# Patient Record
Sex: Female | Born: 1945 | Race: White | Hispanic: No | Smoking: Current some day smoker
Health system: Southern US, Community
[De-identification: ages and names within clinical notes are randomized; demographics above are authoritative.]

## PROBLEM LIST (undated history)

## (undated) DIAGNOSIS — T8859XA Other complications of anesthesia, initial encounter: Secondary | ICD-10-CM

## (undated) DIAGNOSIS — I2510 Atherosclerotic heart disease of native coronary artery without angina pectoris: Secondary | ICD-10-CM

## (undated) DIAGNOSIS — I509 Heart failure, unspecified: Secondary | ICD-10-CM

## (undated) DIAGNOSIS — H269 Unspecified cataract: Secondary | ICD-10-CM

## (undated) DIAGNOSIS — C801 Malignant (primary) neoplasm, unspecified: Secondary | ICD-10-CM

## (undated) DIAGNOSIS — D649 Anemia, unspecified: Secondary | ICD-10-CM

## (undated) DIAGNOSIS — E785 Hyperlipidemia, unspecified: Secondary | ICD-10-CM

## (undated) DIAGNOSIS — I255 Ischemic cardiomyopathy: Secondary | ICD-10-CM

## (undated) DIAGNOSIS — K219 Gastro-esophageal reflux disease without esophagitis: Secondary | ICD-10-CM

## (undated) DIAGNOSIS — T4145XA Adverse effect of unspecified anesthetic, initial encounter: Secondary | ICD-10-CM

## (undated) DIAGNOSIS — R0602 Shortness of breath: Secondary | ICD-10-CM

## (undated) DIAGNOSIS — M1990 Unspecified osteoarthritis, unspecified site: Secondary | ICD-10-CM

## (undated) HISTORY — DX: Anemia, unspecified: D64.9

## (undated) HISTORY — PX: APPENDECTOMY: SHX54

## (undated) HISTORY — DX: Ischemic cardiomyopathy: I25.5

## (undated) HISTORY — DX: Unspecified cataract: H26.9

## (undated) HISTORY — DX: Unspecified osteoarthritis, unspecified site: M19.90

## (undated) HISTORY — PX: BACK SURGERY: SHX140

## (undated) HISTORY — PX: TUBAL LIGATION: SHX77

## (undated) HISTORY — DX: Gastro-esophageal reflux disease without esophagitis: K21.9

## (undated) HISTORY — DX: Heart failure, unspecified: I50.9

## (undated) HISTORY — PX: JOINT REPLACEMENT: SHX530

## (undated) HISTORY — PX: BRAIN SURGERY: SHX531

## (undated) HISTORY — DX: Hyperlipidemia, unspecified: E78.5

## (undated) HISTORY — PX: BI-VENTRICULAR IMPLANTABLE CARDIOVERTER DEFIBRILLATOR  (CRT-D): SHX5747

## (undated) HISTORY — DX: Atherosclerotic heart disease of native coronary artery without angina pectoris: I25.10

## (undated) HISTORY — DX: Shortness of breath: R06.02

---

## 1998-04-03 ENCOUNTER — Ambulatory Visit (HOSPITAL_BASED_OUTPATIENT_CLINIC_OR_DEPARTMENT_OTHER): Admission: RE | Admit: 1998-04-03 | Discharge: 1998-04-03 | Payer: Self-pay | Admitting: Orthopedic Surgery

## 2003-05-11 ENCOUNTER — Encounter: Admission: RE | Admit: 2003-05-11 | Discharge: 2003-05-11 | Payer: Self-pay | Admitting: Urology

## 2003-05-15 ENCOUNTER — Encounter (INDEPENDENT_AMBULATORY_CARE_PROVIDER_SITE_OTHER): Payer: Self-pay | Admitting: Specialist

## 2003-05-15 ENCOUNTER — Ambulatory Visit (HOSPITAL_BASED_OUTPATIENT_CLINIC_OR_DEPARTMENT_OTHER): Admission: RE | Admit: 2003-05-15 | Discharge: 2003-05-15 | Payer: Self-pay | Admitting: Urology

## 2003-05-15 ENCOUNTER — Ambulatory Visit (HOSPITAL_COMMUNITY): Admission: RE | Admit: 2003-05-15 | Discharge: 2003-05-15 | Payer: Self-pay | Admitting: Urology

## 2003-08-01 ENCOUNTER — Inpatient Hospital Stay (HOSPITAL_COMMUNITY): Admission: RE | Admit: 2003-08-01 | Discharge: 2003-08-06 | Payer: Self-pay | Admitting: Orthopaedic Surgery

## 2004-05-22 ENCOUNTER — Encounter: Admission: RE | Admit: 2004-05-22 | Discharge: 2004-05-22 | Payer: Self-pay | Admitting: Orthopedic Surgery

## 2004-06-24 ENCOUNTER — Other Ambulatory Visit: Admission: RE | Admit: 2004-06-24 | Discharge: 2004-06-24 | Payer: Self-pay | Admitting: Family Medicine

## 2004-07-05 ENCOUNTER — Encounter: Admission: RE | Admit: 2004-07-05 | Discharge: 2004-07-05 | Payer: Self-pay | Admitting: Neurosurgery

## 2004-07-24 ENCOUNTER — Inpatient Hospital Stay (HOSPITAL_COMMUNITY): Admission: RE | Admit: 2004-07-24 | Discharge: 2004-07-27 | Payer: Self-pay | Admitting: Neurosurgery

## 2004-10-10 ENCOUNTER — Encounter: Admission: RE | Admit: 2004-10-10 | Discharge: 2004-10-10 | Payer: Self-pay | Admitting: Neurosurgery

## 2004-11-28 ENCOUNTER — Inpatient Hospital Stay (HOSPITAL_COMMUNITY): Admission: RE | Admit: 2004-11-28 | Discharge: 2004-12-03 | Payer: Self-pay | Admitting: Cardiology

## 2005-01-09 ENCOUNTER — Encounter: Admission: RE | Admit: 2005-01-09 | Discharge: 2005-01-09 | Payer: Self-pay | Admitting: Neurosurgery

## 2005-01-28 ENCOUNTER — Ambulatory Visit: Payer: Self-pay | Admitting: Hematology and Oncology

## 2005-01-30 ENCOUNTER — Encounter: Admission: RE | Admit: 2005-01-30 | Discharge: 2005-01-30 | Payer: Self-pay | Admitting: *Deleted

## 2005-02-11 ENCOUNTER — Encounter (INDEPENDENT_AMBULATORY_CARE_PROVIDER_SITE_OTHER): Payer: Self-pay | Admitting: *Deleted

## 2005-02-11 ENCOUNTER — Other Ambulatory Visit: Admission: RE | Admit: 2005-02-11 | Discharge: 2005-02-11 | Payer: Self-pay | Admitting: Hematology and Oncology

## 2005-02-14 ENCOUNTER — Observation Stay (HOSPITAL_COMMUNITY): Admission: EM | Admit: 2005-02-14 | Discharge: 2005-02-15 | Payer: Self-pay | Admitting: Emergency Medicine

## 2005-02-18 ENCOUNTER — Encounter: Admission: RE | Admit: 2005-02-18 | Discharge: 2005-02-18 | Payer: Self-pay | Admitting: Neurosurgery

## 2005-03-21 ENCOUNTER — Ambulatory Visit: Payer: Self-pay | Admitting: Hematology and Oncology

## 2005-03-26 ENCOUNTER — Ambulatory Visit (HOSPITAL_COMMUNITY): Admission: RE | Admit: 2005-03-26 | Discharge: 2005-03-26 | Payer: Self-pay | Admitting: Gastroenterology

## 2005-06-05 ENCOUNTER — Ambulatory Visit (HOSPITAL_COMMUNITY): Admission: RE | Admit: 2005-06-05 | Discharge: 2005-06-05 | Payer: Self-pay | Admitting: Cardiology

## 2005-06-19 ENCOUNTER — Encounter: Admission: RE | Admit: 2005-06-19 | Discharge: 2005-06-19 | Payer: Self-pay | Admitting: Cardiology

## 2005-09-16 ENCOUNTER — Other Ambulatory Visit: Admission: RE | Admit: 2005-09-16 | Discharge: 2005-09-16 | Payer: Self-pay | Admitting: Obstetrics and Gynecology

## 2005-09-17 ENCOUNTER — Ambulatory Visit: Admission: RE | Admit: 2005-09-17 | Discharge: 2005-09-17 | Payer: Self-pay | Admitting: Orthopaedic Surgery

## 2005-09-18 ENCOUNTER — Encounter (HOSPITAL_COMMUNITY): Admission: RE | Admit: 2005-09-18 | Discharge: 2005-12-12 | Payer: Self-pay | Admitting: *Deleted

## 2005-10-24 ENCOUNTER — Ambulatory Visit (HOSPITAL_COMMUNITY): Admission: RE | Admit: 2005-10-24 | Discharge: 2005-10-24 | Payer: Self-pay | Admitting: Orthopaedic Surgery

## 2005-11-13 ENCOUNTER — Observation Stay (HOSPITAL_COMMUNITY): Admission: AD | Admit: 2005-11-13 | Discharge: 2005-11-14 | Payer: Self-pay | Admitting: *Deleted

## 2005-11-18 ENCOUNTER — Inpatient Hospital Stay (HOSPITAL_COMMUNITY): Admission: RE | Admit: 2005-11-18 | Discharge: 2005-11-23 | Payer: Self-pay | Admitting: Orthopaedic Surgery

## 2005-12-05 ENCOUNTER — Emergency Department (HOSPITAL_COMMUNITY): Admission: EM | Admit: 2005-12-05 | Discharge: 2005-12-05 | Payer: Self-pay | Admitting: Emergency Medicine

## 2005-12-22 ENCOUNTER — Encounter
Admission: RE | Admit: 2005-12-22 | Discharge: 2006-03-22 | Payer: Self-pay | Admitting: Physical Medicine & Rehabilitation

## 2005-12-22 ENCOUNTER — Ambulatory Visit: Payer: Self-pay | Admitting: Physical Medicine & Rehabilitation

## 2006-01-07 ENCOUNTER — Ambulatory Visit (HOSPITAL_COMMUNITY): Admission: RE | Admit: 2006-01-07 | Discharge: 2006-01-07 | Payer: Self-pay | Admitting: *Deleted

## 2006-01-13 ENCOUNTER — Inpatient Hospital Stay (HOSPITAL_COMMUNITY): Admission: AD | Admit: 2006-01-13 | Discharge: 2006-01-16 | Payer: Self-pay | Admitting: Internal Medicine

## 2006-01-15 ENCOUNTER — Encounter (INDEPENDENT_AMBULATORY_CARE_PROVIDER_SITE_OTHER): Payer: Self-pay | Admitting: Specialist

## 2006-03-17 ENCOUNTER — Ambulatory Visit: Payer: Self-pay | Admitting: Physical Medicine & Rehabilitation

## 2006-04-01 ENCOUNTER — Encounter: Admission: RE | Admit: 2006-04-01 | Discharge: 2006-04-01 | Payer: Self-pay | Admitting: *Deleted

## 2006-04-02 ENCOUNTER — Encounter
Admission: RE | Admit: 2006-04-02 | Discharge: 2006-07-01 | Payer: Self-pay | Admitting: Physical Medicine & Rehabilitation

## 2006-05-07 ENCOUNTER — Ambulatory Visit: Payer: Self-pay | Admitting: Physical Medicine & Rehabilitation

## 2006-05-08 ENCOUNTER — Ambulatory Visit: Payer: Self-pay | Admitting: Licensed Clinical Social Worker

## 2006-05-29 ENCOUNTER — Ambulatory Visit: Payer: Self-pay | Admitting: Licensed Clinical Social Worker

## 2006-06-26 ENCOUNTER — Ambulatory Visit: Payer: Self-pay | Admitting: Licensed Clinical Social Worker

## 2006-07-24 ENCOUNTER — Ambulatory Visit: Payer: Self-pay | Admitting: Licensed Clinical Social Worker

## 2006-08-07 ENCOUNTER — Ambulatory Visit: Payer: Self-pay | Admitting: Licensed Clinical Social Worker

## 2006-08-28 ENCOUNTER — Ambulatory Visit: Payer: Self-pay | Admitting: Licensed Clinical Social Worker

## 2006-08-31 ENCOUNTER — Ambulatory Visit: Payer: Self-pay | Admitting: Physical Medicine & Rehabilitation

## 2006-08-31 ENCOUNTER — Encounter
Admission: RE | Admit: 2006-08-31 | Discharge: 2006-11-29 | Payer: Self-pay | Admitting: Physical Medicine & Rehabilitation

## 2006-09-11 ENCOUNTER — Ambulatory Visit: Payer: Self-pay | Admitting: Licensed Clinical Social Worker

## 2006-09-23 ENCOUNTER — Other Ambulatory Visit: Admission: RE | Admit: 2006-09-23 | Discharge: 2006-09-23 | Payer: Self-pay | Admitting: Obstetrics and Gynecology

## 2006-10-09 ENCOUNTER — Ambulatory Visit: Payer: Self-pay | Admitting: Licensed Clinical Social Worker

## 2006-10-26 ENCOUNTER — Ambulatory Visit: Payer: Self-pay | Admitting: Physical Medicine & Rehabilitation

## 2006-11-27 ENCOUNTER — Ambulatory Visit: Payer: Self-pay | Admitting: Licensed Clinical Social Worker

## 2007-02-04 ENCOUNTER — Encounter (HOSPITAL_COMMUNITY): Admission: RE | Admit: 2007-02-04 | Discharge: 2007-04-02 | Payer: Self-pay | Admitting: Internal Medicine

## 2007-02-09 ENCOUNTER — Inpatient Hospital Stay (HOSPITAL_COMMUNITY): Admission: EM | Admit: 2007-02-09 | Discharge: 2007-02-12 | Payer: Self-pay | Admitting: Emergency Medicine

## 2007-02-10 ENCOUNTER — Encounter (INDEPENDENT_AMBULATORY_CARE_PROVIDER_SITE_OTHER): Payer: Self-pay | Admitting: Internal Medicine

## 2007-03-10 ENCOUNTER — Ambulatory Visit (HOSPITAL_COMMUNITY): Admission: RE | Admit: 2007-03-10 | Discharge: 2007-03-10 | Payer: Self-pay | Admitting: Cardiology

## 2007-03-23 ENCOUNTER — Inpatient Hospital Stay (HOSPITAL_COMMUNITY): Admission: AD | Admit: 2007-03-23 | Discharge: 2007-03-31 | Payer: Self-pay | Admitting: Cardiology

## 2007-03-23 ENCOUNTER — Ambulatory Visit: Payer: Self-pay | Admitting: Hematology and Oncology

## 2007-04-18 ENCOUNTER — Inpatient Hospital Stay (HOSPITAL_COMMUNITY): Admission: EM | Admit: 2007-04-18 | Discharge: 2007-04-23 | Payer: Self-pay | Admitting: *Deleted

## 2007-04-19 ENCOUNTER — Encounter (INDEPENDENT_AMBULATORY_CARE_PROVIDER_SITE_OTHER): Payer: Self-pay | Admitting: Internal Medicine

## 2007-05-07 ENCOUNTER — Encounter (HOSPITAL_COMMUNITY): Admission: RE | Admit: 2007-05-07 | Discharge: 2007-08-05 | Payer: Self-pay | Admitting: Family Medicine

## 2007-07-09 ENCOUNTER — Ambulatory Visit: Payer: Self-pay | Admitting: Licensed Clinical Social Worker

## 2007-07-26 ENCOUNTER — Ambulatory Visit (HOSPITAL_COMMUNITY): Admission: RE | Admit: 2007-07-26 | Discharge: 2007-07-26 | Payer: Self-pay | Admitting: Unknown Physician Specialty

## 2007-07-28 ENCOUNTER — Observation Stay (HOSPITAL_COMMUNITY): Admission: AD | Admit: 2007-07-28 | Discharge: 2007-07-29 | Payer: Self-pay | Admitting: Cardiology

## 2007-09-14 ENCOUNTER — Encounter (HOSPITAL_COMMUNITY): Admission: RE | Admit: 2007-09-14 | Discharge: 2007-11-08 | Payer: Self-pay | Admitting: Unknown Physician Specialty

## 2007-10-28 ENCOUNTER — Inpatient Hospital Stay (HOSPITAL_COMMUNITY): Admission: AD | Admit: 2007-10-28 | Discharge: 2007-11-01 | Payer: Self-pay | Admitting: Internal Medicine

## 2008-03-14 ENCOUNTER — Encounter
Admission: RE | Admit: 2008-03-14 | Discharge: 2008-06-12 | Payer: Self-pay | Admitting: Physical Medicine & Rehabilitation

## 2008-03-15 ENCOUNTER — Ambulatory Visit: Payer: Self-pay | Admitting: Physical Medicine & Rehabilitation

## 2008-04-17 ENCOUNTER — Ambulatory Visit: Payer: Self-pay | Admitting: Physical Medicine & Rehabilitation

## 2008-06-26 ENCOUNTER — Encounter: Admission: RE | Admit: 2008-06-26 | Discharge: 2008-06-26 | Payer: Self-pay | Admitting: Family Medicine

## 2008-07-05 ENCOUNTER — Encounter
Admission: RE | Admit: 2008-07-05 | Discharge: 2008-08-11 | Payer: Self-pay | Admitting: Physical Medicine & Rehabilitation

## 2008-07-10 ENCOUNTER — Ambulatory Visit: Payer: Self-pay | Admitting: Physical Medicine & Rehabilitation

## 2008-08-11 ENCOUNTER — Ambulatory Visit: Payer: Self-pay | Admitting: Physical Medicine & Rehabilitation

## 2008-10-31 ENCOUNTER — Encounter
Admission: RE | Admit: 2008-10-31 | Discharge: 2009-01-29 | Payer: Self-pay | Admitting: Physical Medicine & Rehabilitation

## 2008-11-07 ENCOUNTER — Ambulatory Visit: Payer: Self-pay | Admitting: Physical Medicine & Rehabilitation

## 2008-11-27 ENCOUNTER — Encounter
Admission: RE | Admit: 2008-11-27 | Discharge: 2008-12-28 | Payer: Self-pay | Admitting: Physical Medicine & Rehabilitation

## 2009-01-03 IMAGING — CR DG CHEST 2V
2 series · 2 of 2 positions shown · non-contrast
Comparison: 04/22/07.

CLINICAL DATA: Follow-up edema. 
 CHEST - 2 VIEW:

[w chest pa]
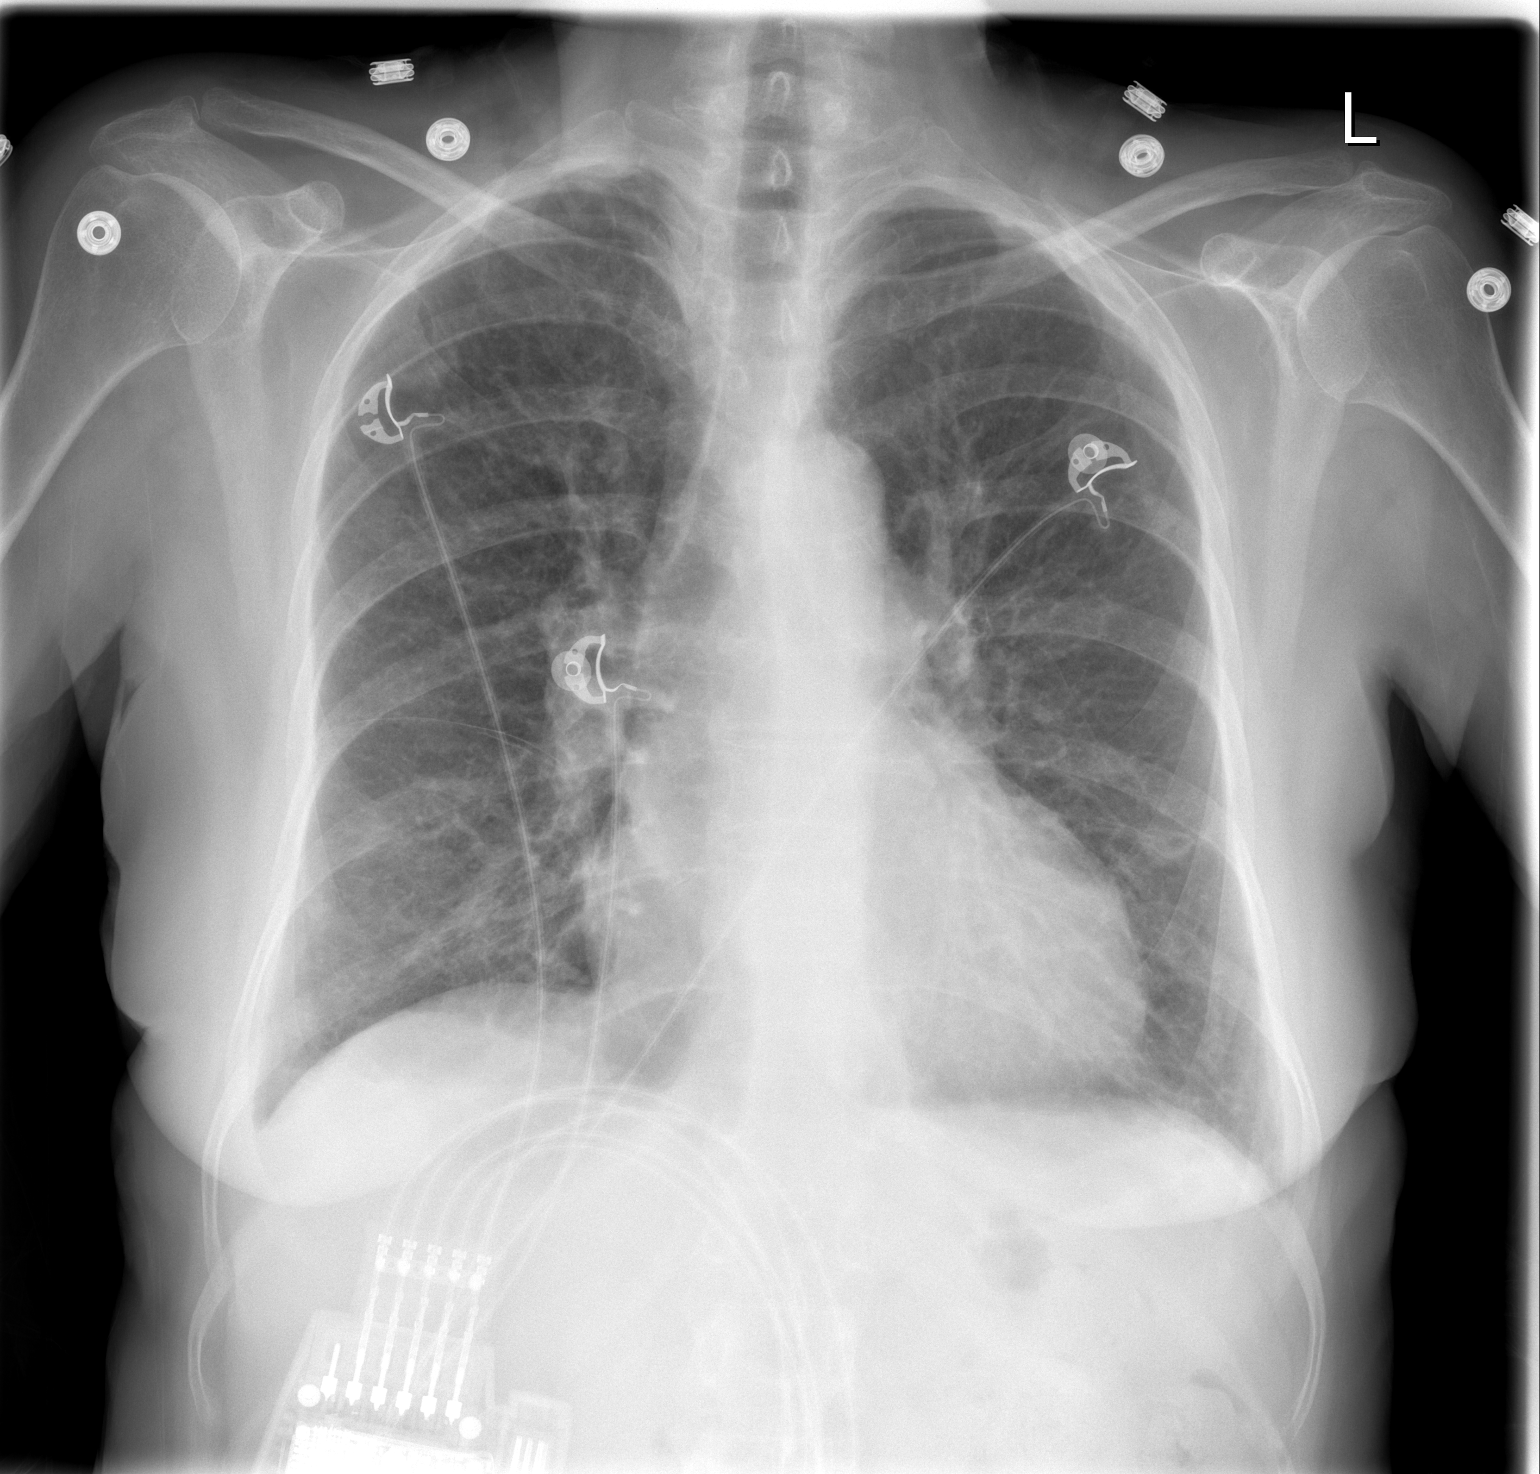

[w chest lat]
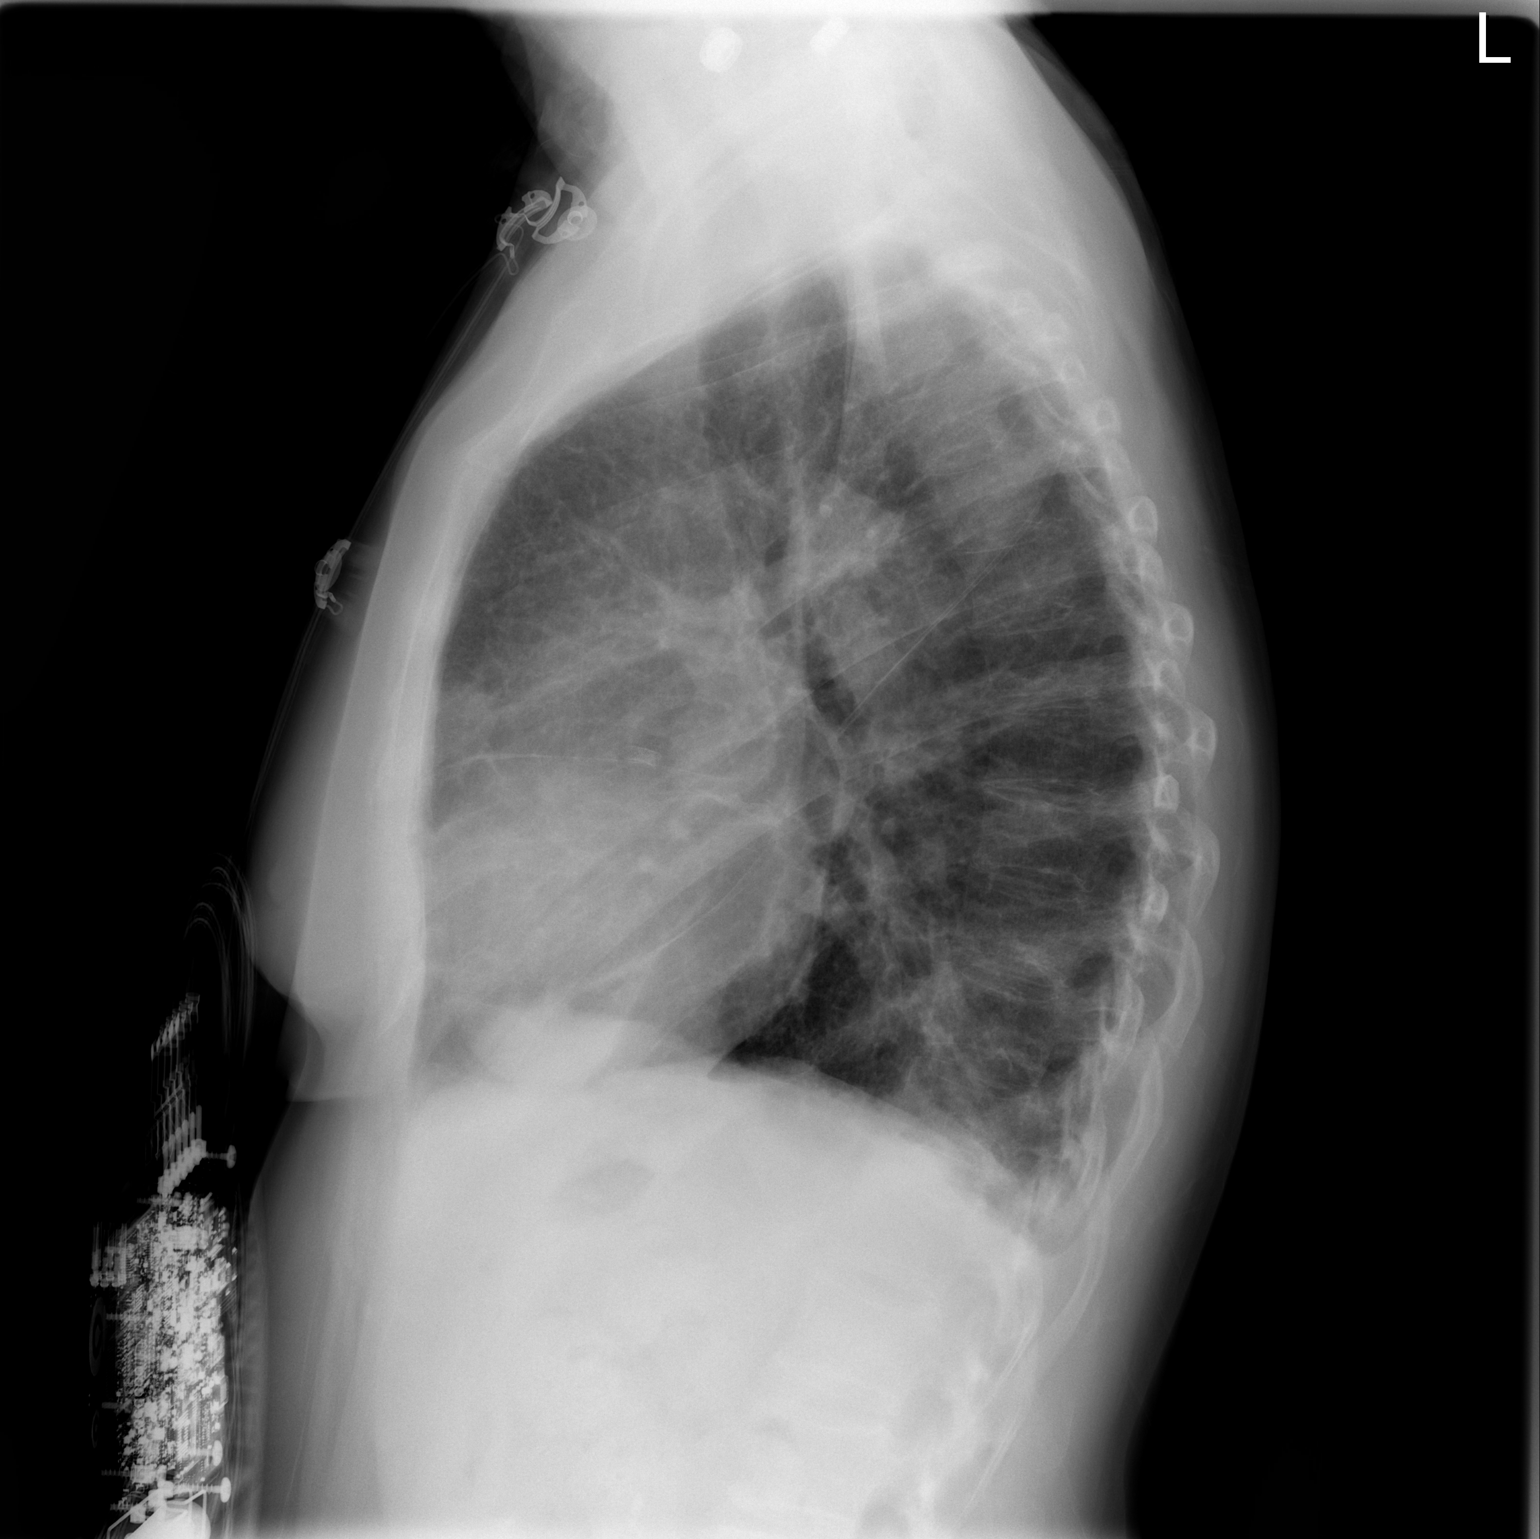

[2 of 2 positions shown; findings below may reference images not displayed]

FINDINGS: Continued improvement in pulmonary edema which is nearly completely resolved.  There remain small bilateral effusions.  There is some underlying COPD and scarring.  Coronary stent is noted in the region of the LAD.
IMPRESSION: Improving congestive heart failure and edema.  There are small bilateral effusions.

## 2009-01-22 ENCOUNTER — Ambulatory Visit: Payer: Self-pay | Admitting: Physical Medicine & Rehabilitation

## 2009-04-12 ENCOUNTER — Encounter
Admission: RE | Admit: 2009-04-12 | Discharge: 2009-07-11 | Payer: Self-pay | Admitting: Physical Medicine & Rehabilitation

## 2009-04-16 ENCOUNTER — Ambulatory Visit: Payer: Self-pay | Admitting: Physical Medicine & Rehabilitation

## 2009-05-03 ENCOUNTER — Ambulatory Visit: Payer: Self-pay | Admitting: Physical Medicine & Rehabilitation

## 2009-05-03 ENCOUNTER — Encounter
Admission: RE | Admit: 2009-05-03 | Discharge: 2009-07-09 | Payer: Self-pay | Admitting: Physical Medicine & Rehabilitation

## 2009-06-01 ENCOUNTER — Ambulatory Visit: Payer: Self-pay | Admitting: Physical Medicine & Rehabilitation

## 2009-06-19 ENCOUNTER — Encounter
Admission: RE | Admit: 2009-06-19 | Discharge: 2009-06-19 | Payer: Self-pay | Admitting: Physical Medicine & Rehabilitation

## 2009-06-22 ENCOUNTER — Ambulatory Visit: Payer: Self-pay | Admitting: Physical Medicine & Rehabilitation

## 2009-07-09 ENCOUNTER — Ambulatory Visit: Payer: Self-pay | Admitting: Physical Medicine & Rehabilitation

## 2009-07-11 IMAGING — US US ABDOMEN COMPLETE
1 series · 13 of 25 positions shown · non-contrast
Comparison: CT from 03/24/2007

CLINICAL DATA: Shortness of breath and evaluate the gallbladder

ABDOMEN ULTRASOUND
TECHNIQUE: Complete abdominal ultrasound examination was performed
including evaluation of the liver, gallbladder, bile ducts,
pancreas, kidneys, spleen, IVC, and abdominal aorta.

[Series 1: unknown · 0.33mm/px · 13 of 60 slices shown]
[im 1/60]
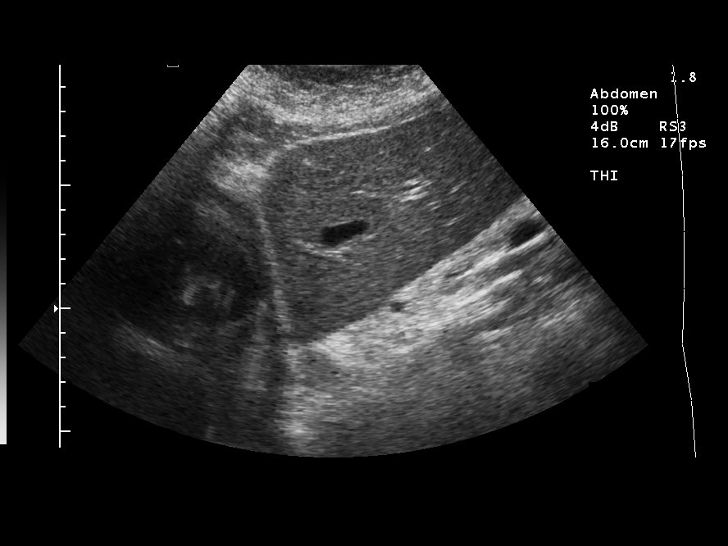
[im 5/60]
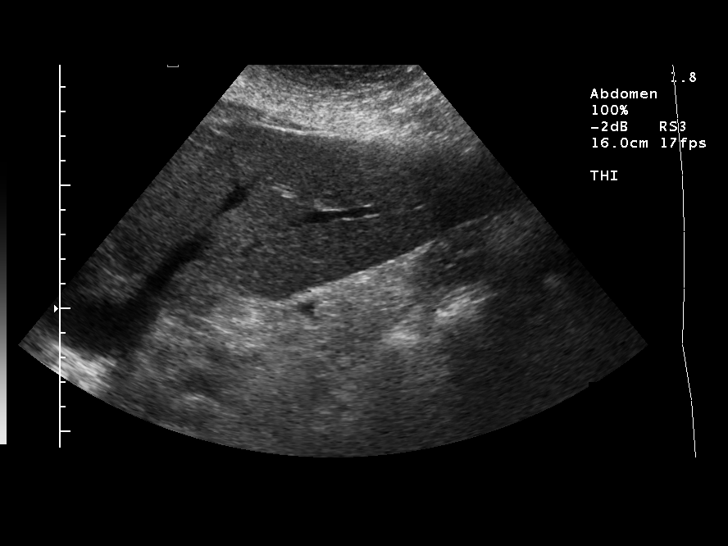
[im 10/60]
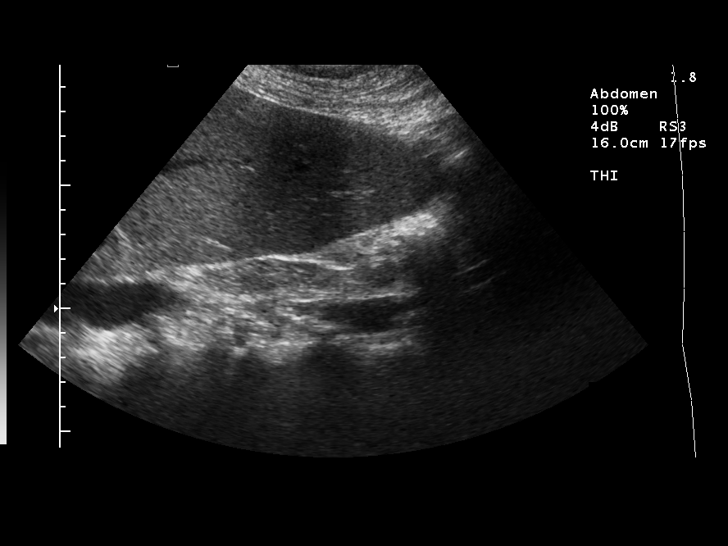
[im 15/60]
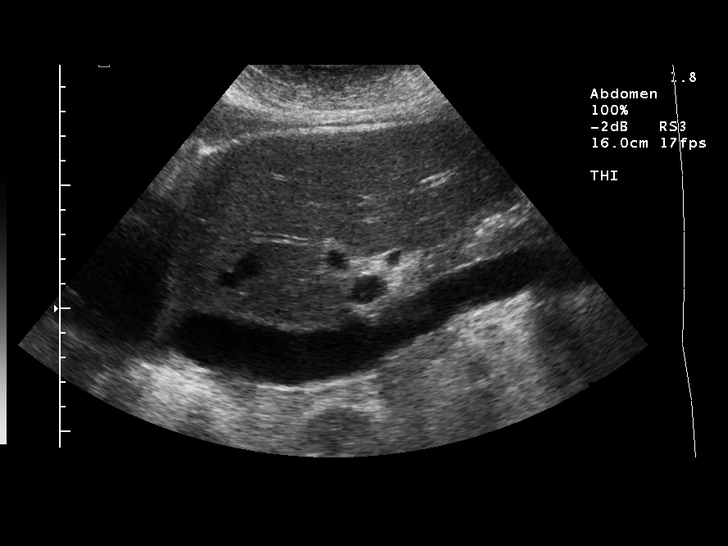
[im 20/60]
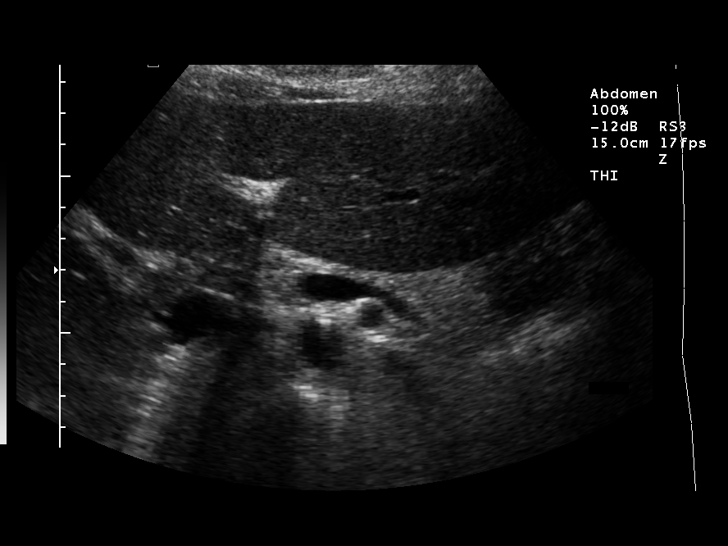
[im 25/60]
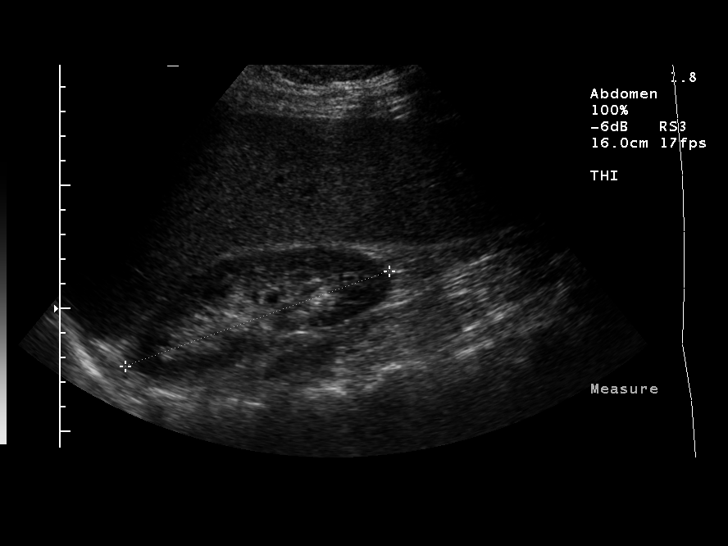
[im 30/60]
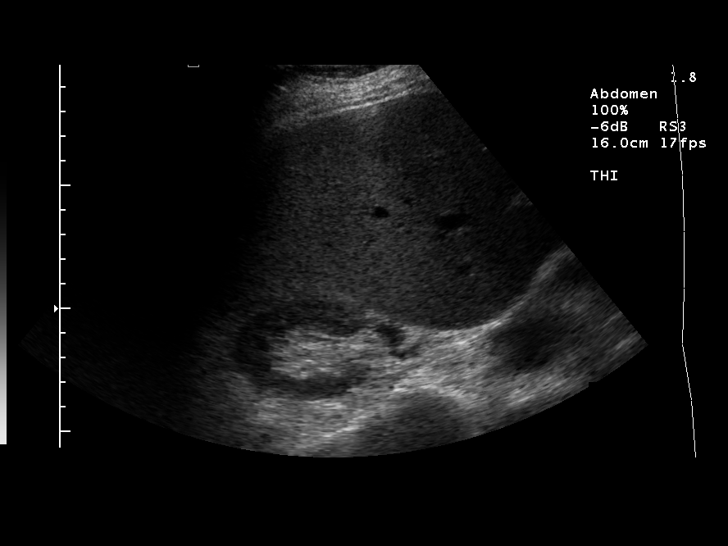
[im 35/60]
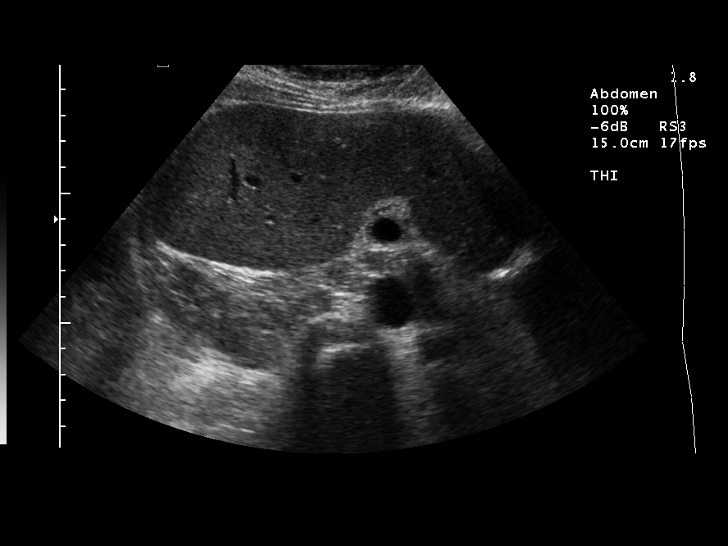
[im 40/60]
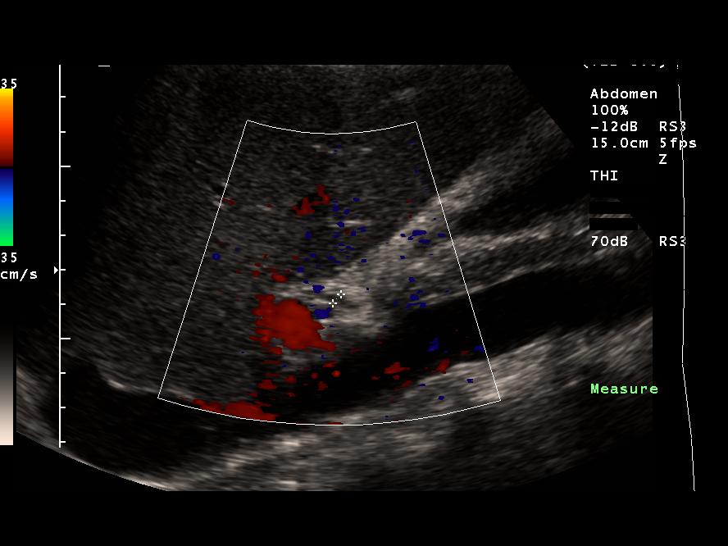
[im 45/60]
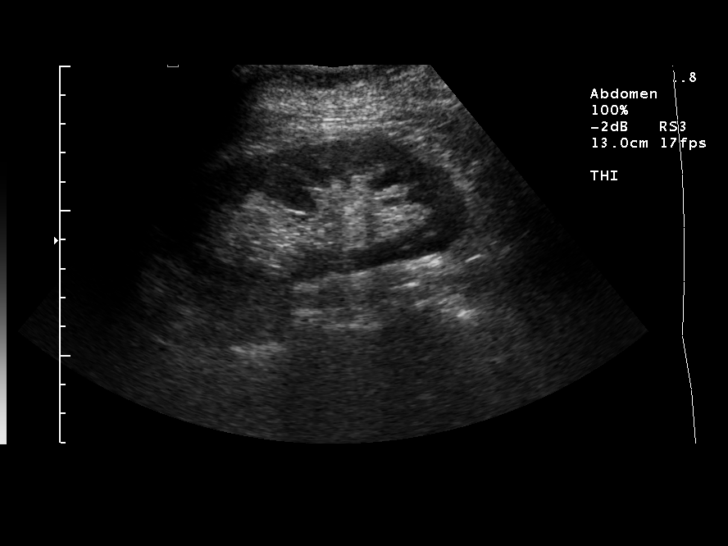
[im 50/60]
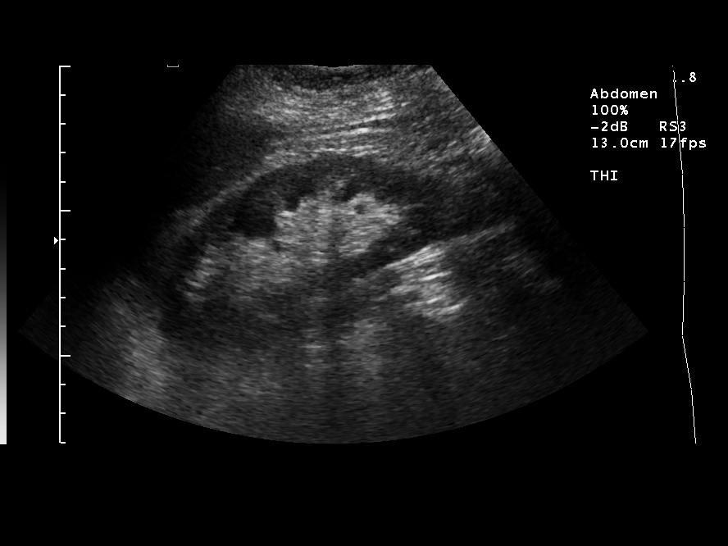
[im 55/60]
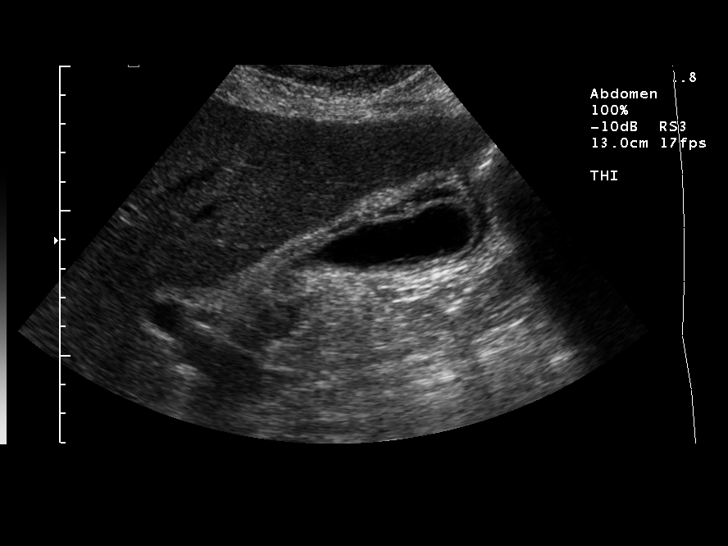
[im 60/60]
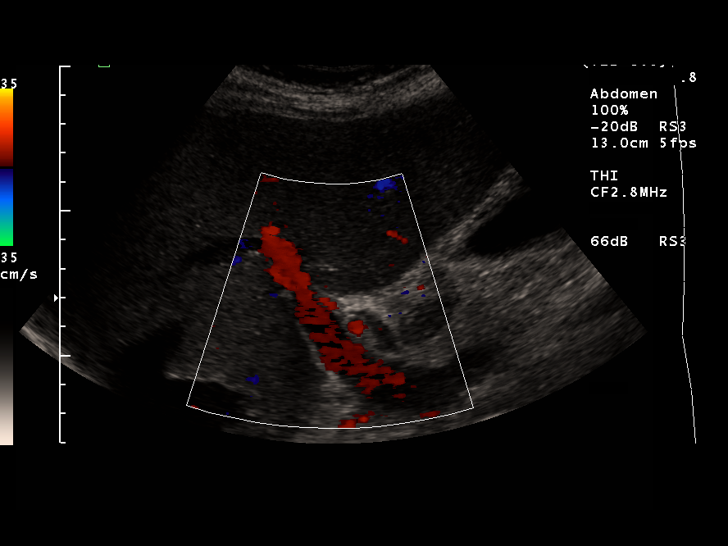

[13 of 25 positions shown; findings below may reference images not displayed]

FINDINGS: Normal appearance of the hepatic parenchyma.  Normal
appearance of the pancreatic head and body.  The tail of the
pancreas is obscured by bowel gas.  The right kidney has a normal
morphology without hydronephrosis.  Right kidney measures 11.4 cm
in length.  The gallbladder is not distended because the patient
drank fluids prior to the procedure.  However, the gallbladder wall
is markedly thickened and appears to be edematous.  The gallbladder
wall measures up to 4 mm.  Difficult to exclude a sliver of fluid
around the gallbladder.  Common bile duct is nondilated measuring
up to 4 mm.  The spleen has a normal morphology measuring 9.8 cm in
length.  The left kidney has a normal appearance measuring 11.1 cm
in length without hydronephrosis. Main portal vein is patent.

The patient was kept n.p.o. and restudied 7 hours later.  There is
slightly increased fluid within the gallbladder but the patient
continues to have gallbladder wall thickening and there may be a
tiny amount of pericholecystic fluid present.
IMPRESSION: Thickening and possible edema of the gallbladder wall.  Tiny amount
of pericholecystic fluid.  No evidence for gallstones.

## 2009-08-20 ENCOUNTER — Encounter
Admission: RE | Admit: 2009-08-20 | Discharge: 2009-08-24 | Payer: Self-pay | Admitting: Physical Medicine & Rehabilitation

## 2009-08-24 ENCOUNTER — Ambulatory Visit: Payer: Self-pay | Admitting: Physical Medicine & Rehabilitation

## 2009-10-05 ENCOUNTER — Encounter
Admission: RE | Admit: 2009-10-05 | Discharge: 2009-12-08 | Payer: Self-pay | Admitting: Physical Medicine & Rehabilitation

## 2009-10-15 ENCOUNTER — Ambulatory Visit: Payer: Self-pay | Admitting: Physical Medicine & Rehabilitation

## 2009-10-30 ENCOUNTER — Other Ambulatory Visit: Admission: RE | Admit: 2009-10-30 | Discharge: 2009-10-30 | Payer: Self-pay | Admitting: Obstetrics and Gynecology

## 2009-11-20 ENCOUNTER — Encounter: Admission: RE | Admit: 2009-11-20 | Discharge: 2009-11-20 | Payer: Self-pay | Admitting: Obstetrics and Gynecology

## 2009-11-28 ENCOUNTER — Ambulatory Visit: Payer: Self-pay | Admitting: Physical Medicine & Rehabilitation

## 2009-12-06 ENCOUNTER — Inpatient Hospital Stay (HOSPITAL_COMMUNITY)
Admission: RE | Admit: 2009-12-06 | Discharge: 2009-12-12 | Payer: Self-pay | Source: Home / Self Care | Attending: *Deleted | Admitting: *Deleted

## 2009-12-07 ENCOUNTER — Ambulatory Visit: Payer: Self-pay | Admitting: Physical Medicine & Rehabilitation

## 2010-02-25 ENCOUNTER — Encounter
Admission: RE | Admit: 2010-02-25 | Discharge: 2010-04-30 | Payer: Self-pay | Source: Home / Self Care | Attending: Physical Medicine & Rehabilitation | Admitting: Physical Medicine & Rehabilitation

## 2010-03-04 ENCOUNTER — Ambulatory Visit: Payer: Self-pay | Admitting: Physical Medicine & Rehabilitation

## 2010-04-21 ENCOUNTER — Encounter: Payer: Self-pay | Admitting: Orthopedic Surgery

## 2010-05-31 LAB — ICD DEVICE OBSERVATION

## 2010-06-03 ENCOUNTER — Encounter: Payer: Medicare Other | Attending: Physical Medicine & Rehabilitation

## 2010-06-03 ENCOUNTER — Ambulatory Visit (HOSPITAL_BASED_OUTPATIENT_CLINIC_OR_DEPARTMENT_OTHER): Payer: Medicare Other | Admitting: Physical Medicine & Rehabilitation

## 2010-06-03 DIAGNOSIS — M961 Postlaminectomy syndrome, not elsewhere classified: Secondary | ICD-10-CM | POA: Insufficient documentation

## 2010-06-03 DIAGNOSIS — G500 Trigeminal neuralgia: Secondary | ICD-10-CM | POA: Insufficient documentation

## 2010-06-03 DIAGNOSIS — M47817 Spondylosis without myelopathy or radiculopathy, lumbosacral region: Secondary | ICD-10-CM

## 2010-06-03 DIAGNOSIS — M19079 Primary osteoarthritis, unspecified ankle and foot: Secondary | ICD-10-CM

## 2010-06-03 DIAGNOSIS — F341 Dysthymic disorder: Secondary | ICD-10-CM

## 2010-06-13 LAB — DIFFERENTIAL
Basophils Absolute: 0 10*3/uL (ref 0.0–0.1)
Basophils Relative: 0 % (ref 0–1)
Eosinophils Absolute: 0.1 10*3/uL (ref 0.0–0.7)
Eosinophils Relative: 2 % (ref 0–5)
Lymphocytes Relative: 22 % (ref 12–46)

## 2010-06-13 LAB — CBC
HCT: 27.9 % — ABNORMAL LOW (ref 36.0–46.0)
Hemoglobin: 10.5 g/dL — ABNORMAL LOW (ref 12.0–15.0)
Hemoglobin: 8.8 g/dL — ABNORMAL LOW (ref 12.0–15.0)
Hemoglobin: 9.4 g/dL — ABNORMAL LOW (ref 12.0–15.0)
MCH: 29.8 pg (ref 26.0–34.0)
MCH: 29.9 pg (ref 26.0–34.0)
MCH: 29.9 pg (ref 26.0–34.0)
MCHC: 31.4 g/dL (ref 30.0–36.0)
MCHC: 31.5 g/dL (ref 30.0–36.0)
MCV: 94.9 fL (ref 78.0–100.0)
MCV: 94.9 fL (ref 78.0–100.0)
MCV: 95.2 fL (ref 78.0–100.0)
Platelets: 260 10*3/uL (ref 150–400)
RBC: 3.51 MIL/uL — ABNORMAL LOW (ref 3.87–5.11)

## 2010-06-13 LAB — MAGNESIUM: Magnesium: 2.1 mg/dL (ref 1.5–2.5)

## 2010-06-13 LAB — TYPE AND SCREEN: ABO/RH(D): A POS

## 2010-06-13 LAB — COMPREHENSIVE METABOLIC PANEL
AST: 19 U/L (ref 0–37)
CO2: 29 mEq/L (ref 19–32)
Chloride: 100 mEq/L (ref 96–112)
Creatinine, Ser: 0.81 mg/dL (ref 0.4–1.2)
GFR calc Af Amer: >60 mL/min (ref >60)
GFR calc non Af Amer: >60 mL/min (ref >60)
Total Bilirubin: 0.3 mg/dL (ref 0.3–1.2)

## 2010-06-13 LAB — BASIC METABOLIC PANEL
BUN: 5 mg/dL — ABNORMAL LOW (ref 6–23)
CO2: 28 mEq/L (ref 19–32)
Calcium: 8.6 mg/dL (ref 8.4–10.5)
Chloride: 103 mEq/L (ref 96–112)
Chloride: 106 mEq/L (ref 96–112)
Creatinine, Ser: 0.61 mg/dL (ref 0.4–1.2)
Creatinine, Ser: 0.82 mg/dL (ref 0.4–1.2)
Glucose, Bld: 118 mg/dL — ABNORMAL HIGH (ref 70–99)
Glucose, Bld: 94 mg/dL (ref 70–99)

## 2010-06-13 LAB — URINALYSIS, ROUTINE W REFLEX MICROSCOPIC
Glucose, UA: NEGATIVE mg/dL
Hgb urine dipstick: NEGATIVE
Ketones, ur: NEGATIVE mg/dL
Protein, ur: NEGATIVE mg/dL

## 2010-06-13 LAB — SURGICAL PCR SCREEN: Staphylococcus aureus: NEGATIVE

## 2010-06-13 LAB — PROTIME-INR: Prothrombin Time: 13.3 seconds (ref 11.6–15.2)

## 2010-06-17 ENCOUNTER — Encounter (INDEPENDENT_AMBULATORY_CARE_PROVIDER_SITE_OTHER): Payer: Self-pay | Admitting: *Deleted

## 2010-06-27 NOTE — Letter (Signed)
Summary: Appointment - Reminder 2  [facility], Main Office  [address]   [place], [place] [address]   Phone: [phone]  Fax: [phone]     June 17, 2010 MRN: [id number]   [facility] [address] [place], [place]  [address]   Dear Ms. Royle,  Our records indicate that it is time to schedule a follow-up appointment.  Dr.Taylor recommended that you follow up with [place] in June. It is very important that we reach you to schedule this appointment. We look forward to participating in your health care needs. Please contact us at the number listed above at your earliest convenience to schedule your appointment.  If you are unable to make an appointment at this time, give [place] a call so we can update our records.     Sincerely,   Glass blower/designer

## 2010-06-28 ENCOUNTER — Encounter: Payer: Medicare Other | Attending: Physical Medicine & Rehabilitation

## 2010-06-28 ENCOUNTER — Ambulatory Visit (HOSPITAL_BASED_OUTPATIENT_CLINIC_OR_DEPARTMENT_OTHER): Payer: Medicare Other | Admitting: Physical Medicine & Rehabilitation

## 2010-06-28 DIAGNOSIS — M19079 Primary osteoarthritis, unspecified ankle and foot: Secondary | ICD-10-CM | POA: Insufficient documentation

## 2010-06-28 DIAGNOSIS — R51 Headache: Secondary | ICD-10-CM | POA: Insufficient documentation

## 2010-06-28 DIAGNOSIS — M961 Postlaminectomy syndrome, not elsewhere classified: Secondary | ICD-10-CM | POA: Insufficient documentation

## 2010-06-28 DIAGNOSIS — M545 Low back pain, unspecified: Secondary | ICD-10-CM | POA: Insufficient documentation

## 2010-06-28 DIAGNOSIS — M7750 Other enthesopathy of unspecified foot: Secondary | ICD-10-CM

## 2010-06-28 DIAGNOSIS — G500 Trigeminal neuralgia: Secondary | ICD-10-CM | POA: Insufficient documentation

## 2010-06-28 DIAGNOSIS — F341 Dysthymic disorder: Secondary | ICD-10-CM

## 2010-07-10 ENCOUNTER — Encounter (HOSPITAL_COMMUNITY): Payer: Medicare Other

## 2010-07-30 ENCOUNTER — Encounter: Payer: Medicare Other | Attending: Physical Medicine & Rehabilitation | Admitting: Physical Medicine & Rehabilitation

## 2010-07-30 DIAGNOSIS — M19079 Primary osteoarthritis, unspecified ankle and foot: Secondary | ICD-10-CM

## 2010-07-30 DIAGNOSIS — F341 Dysthymic disorder: Secondary | ICD-10-CM

## 2010-07-30 DIAGNOSIS — G500 Trigeminal neuralgia: Secondary | ICD-10-CM | POA: Insufficient documentation

## 2010-07-30 DIAGNOSIS — M7750 Other enthesopathy of unspecified foot: Secondary | ICD-10-CM

## 2010-07-30 DIAGNOSIS — M961 Postlaminectomy syndrome, not elsewhere classified: Secondary | ICD-10-CM | POA: Insufficient documentation

## 2010-07-30 DIAGNOSIS — G90529 Complex regional pain syndrome I of unspecified lower limb: Secondary | ICD-10-CM

## 2010-07-30 DIAGNOSIS — M545 Low back pain, unspecified: Secondary | ICD-10-CM | POA: Insufficient documentation

## 2010-07-30 DIAGNOSIS — R51 Headache: Secondary | ICD-10-CM | POA: Insufficient documentation

## 2010-07-30 DIAGNOSIS — I428 Other cardiomyopathies: Secondary | ICD-10-CM | POA: Insufficient documentation

## 2010-07-30 NOTE — Assessment & Plan Note (Signed)
[name] is back regarding her facial and back pain.  She is still having symptoms there.  The Nucynta was not overly helpful and did not work any better than the hydrocodone.  She is upset as her doctors told her she only has 6-12 months to live due to her cardiomyopathy.  She is having home nursing come to the house to check on her on a regular basis apparently.  She rates her pain 5/10 today, described as burning, stabbing, aching, and tingling.  Pain interferes with general activities, relations with others, and enjoyment of life on a moderate- to-severe level.  REVIEW OF SYSTEMS:  Notable for loss of taste, dry mouth, suicidal thoughts, depression, poor appetite, swelling, and shortness of breath. A full 12-point review is in the written health history section of chart.  She states that she becomes short of breath walking from the parking lot to the store at this point.  SOCIAL HISTORY:  The patient is separated and lives alone.  She has family members involved with her life and is involved with the church as well.  PHYSICAL EXAMINATION:  VITAL SIGNS:  Blood pressure is 80/51, pulse 85, respiratory rate 18, and she is saturating 92% on room air. GENERAL:  The patient is pleasant in no acute distress. MUSCULOSKELETAL:  She is able to move and walk without any difficulties today in the room.  Facial exam was unchanged.  Strength is near 5/5 throughout.  Emotionally, she is bit anxious and tearful at times.  ASSESSMENT: 1. Right-sided trigeminal neuralgia. 2. Lumbar post laminectomy syndrome. 3. Osteoarthritis, bilateral feet. 4. Cardiomyopathy.  PLAN: 1. We will start fentanyl patch 25 mcg q.72 h for pain control.  She     can use her hydrocodone as needed. 2. Encouraged ongoing socialization and relaxation. 3. Cardiac treatment per Dr. Anne Fu. 4. No other medicine changes were made by me today. 5. I will see her back here in about 1 months' time for  reassessment.     [name], M.D. Electronically Signed    ZTS/MedQ D:  06/28/2010 13:32:12  T:  06/29/2010 00:07:43  Job #:  [id number]  cc:   Jake Bathe, MD Fax: [phone]

## 2010-07-31 NOTE — Assessment & Plan Note (Signed)
[name] is back regarding her facial pain and low back pain.  She has had positive response with the fentanyl.  She feels that her pain is more controlled and less cycling.  It is still around 4-5/10, but she overall feels that she is doing better.  She has been a little bit better emotionally.  She is sleeping better with the nortriptyline.  She still has some pain in the face at times on the right side.  She complains of some left first and second toe pain, but realizes it is from her sandals that she bought that is compressing the interspace there.  Her shins are tender.  REVIEW OF SYSTEMS:  Notable for depression, loss of taste, and weakness. Full 12-point review is in the written health and history section of the chart.  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  She still does not believe that her cardiac prognosis is as grim as her physicians are telling her.  PHYSICAL EXAMINATION:  VITAL SIGNS:  Blood pressure is 99/65, pulse is 90, respiratory rate 18, and she is saturating 96% on room air. GENERAL:  The patient is pleasant, alert, and oriented x3.  Affect is generally bright and appropriate.  She is anxious, but pleasant. MUSCULOSKELETAL;  She has some tenderness over the left first and second toe and small bruise there.  She has tenderness over the tibia body bilaterally.  Facial area remains tender to light touch but seems to be less guarding there overall.  Low back range of motion is stable and generally improved.  Laminectomy scars noted.  Strength remains generally 5/5 throughout.  ASSESSMENT: 1. Right-sided trigeminal neuralgia. 2. Lumbar postlaminectomy syndrome. 3. Osteoarthritis, bilateral feet. 4. Cardiomyopathy.  PLAN: 1. She is doing well with the fentanyl patch.  We will stay at 25 mcg     q.72 h with hydrocodone for breakthrough pain. 2. She will use Pamelor at night for sleep and neuropathic  symptoms. 3. Continue socialization activities. 4. Cardiac treatment per Dr. Anne Fu. 5. I will have her see my nurse practitioner back in about 1 month's     time.     [name], M.D. Electronically Signed    ZTS/MedQ D:  07/30/2010 12:13:20  T:  07/31/2010 00:32:09  Job #:  [id number]  cc:   Jake Bathe, MD Fax: [phone]

## 2010-08-13 NOTE — Cardiovascular Report (Signed)
[name], [name]               ACCOUNT NO.:  [id number]   MEDICAL RECORD NO.:  [id number]          PATIENT TYPE:  INP   LOCATION:  2807                         FACILITY:  [facility]   PHYSICIAN:  Francisca December, M.D.  DATE OF BIRTH:  [birth year]   DATE OF PROCEDURE:  04/21/2007  DATE OF DISCHARGE:                            CARDIAC CATHETERIZATION   PROCEDURES PERFORMED:  1. Intravascular ultrasound.  2. Percutaneous coronary intervention/bare metal stent implantation,      proximal left anterior descending artery.   INDICATIONS:  [name] is a 65 year old woman who had a drug-  eluting stent placed in 2005 in the proximal LAD.  In September 2008 her  Plavix was stopped for 5-7 days to allow for an intracranial operative  procedure for trigeminal nerve release.  The procedure was,  unfortunately, complicated by subacute thrombosis of the LAD and a large  anterolateral wall myocardial infarction.  This could only be treated  medically as she was immediate postop.  In the aftermath of this she has  developed intermittent heart failure due to poor LV function.  Her EF is  in the 25-30% range.  However, a viability study using rest thallium has  demonstrated significant viability in the distal anterolateral and  anterior walls.  Previous coronary angiography has revealed in-stent  restenosis but the anterior descending artery is patent.  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  The  plan is for an intervention that will allow for Plavix x1 month only,  then discontinuation of her Plavix such that her GI bleeding will cease.   PROCEDURE NOTE:  The patient was brought to the cardiac catheterization  laboratory in a fasting state.  The right groin was prepped and draped  in the usual sterile fashion.  Local anesthesia was obtained with the  infiltration of 1% lidocaine.  A  6-French catheter sheath was inserted  percutaneously into the right femoral artery utilizing an anterior  approach over a guiding J-wire.  A 6-French #3.5 CLS guiding catheter  was advanced to the ascending aorta, where the left coronary os was  engaged.  The patient received a 0.75 mg/kg bolus followed by a 1.75  mg/kg per hour constant infusion.  The resultant ACT was 366 seconds.  The patient, as mentioned, has been treated chronically with Plavix and  aspirin.   A 0.014-inch Luge intracoronary guidewire was passed across the lesion  within the stented segment without difficulty.  Intravascular ultrasound  was then performed using a Scimed Atlantis ultrasound catheter.  Two  mechanical pullback imaging runs were obtained.  The IVUS catheter was  removed and the images were analyzed.  It was elected to proceed with a  bare metal stent implantation within the previous stented segment.  The  remainder the artery was without significant obstruction.  Therefore, a  3.5/16-mm Scimed Liberte was selected and carefully positioned generally  within the previously-stented segment.  It was deployed there to peak  pressure of 9 atmospheres for less than  1 minute.  The stent balloon was  deflated and removed and post dilatation performed with the 3.5/15-mm  Scimed Quantum Maverick intracoronary balloon.  This was carefully  positioned and inflated to 16 atmospheres for not greater than 1 minute.  The postdilatation balloon was removed and a follow-up intravascular  ultrasound run obtained again with the Atlantis catheter by mechanical  pullback.  The images appeared to show an excellent result, which was  also present by angiography performed in orthogonal views.  The guiding  catheter and guidewire were removed.  A right femoral arteriogram in the  45-degree RAO angulation via the catheter sheath by hand injection  demonstrated adequate anatomy for placement of the percutaneous closure  device  Angio-Seal.  This was subsequent deployed with good hemostasis  and an intact distal pulse.  It should be noted the patient was  relatively hypotensive throughout the procedure, sometimes as low as  65/50 mmHg.  She is chronically hypotensive.  I suspect that the more  severe degree of hypotension was due to her sedation.  She did respond  to up to 10 mcg of IV dopamine, which was subsequently tapered down to 3  mcg/hr. and then discontinued at the completion of the procedure.   INTRAVASCULAR ULTRASOUND RESULTS:  The initial imaging run revealed a  focal in-stent restenosis in the more distal and in the more proximal  portion of the stent.  The more proximal portion appeared to be a  tissue flap.  The diameter of the stent was in the range of 3.2-3.4 mm  in diameter.  Following stent implantation and post dilatation, the  stented segment was widely patent with a diameter of to 3 x 3.3 mm  distally and an area 7.52  sq. mm.  Proximally the diameter was 3.2 x  3.4 mm with an area of 3.4  sq. mm.  There was distal disease, which I  left untreated.  There was superficial calcification seen.  This was  approximately 1.5 cm distal to the stent.  It did involve the origin of  a large diagonal branch.  The diameter within the lesion was 1.9 x 2.3  mm with an area 3.4 sq. mm.   ANGIOGRAPHY:  By angiographic visual methods, the degree of stenosis was  in the 80% range and this was involving the more proximal portion of the  lesion.  The more distal portion of the lesion was quite focal and in  the 60% range.  Following balloon dilatation and stent implantation,  there was no residual stenosis angiographically.   FINAL IMPRESSION:  1. Atherosclerotic coronary vascular disease, single vessel.  2. Severe left ventricular systolic dysfunction secondary to acute      anterolateral wall myocardial infarction, September 2008, viability      demonstrated by rest thallium.  3. Status status post bare  metal stent implantation, proximal anterior      descending artery.  4. Typical angina was reproduced with device insertion and balloon      inflation.  It was exacerbated by the dopamine, which at its      greatest dosage induced a tachycardia of 120 beats per minute.      Francisca December, M.D.  Electronically Signed     JHE/MEDQ  D:  04/21/2007  T:  04/21/2007  Job:  [id number]   cc:   Eartha Inch, MD

## 2010-08-13 NOTE — Discharge Summary (Signed)
[name], [name] NO.:  [id number]   MEDICAL RECORD NO.:  [id number]          PATIENT TYPE:  INP   LOCATION:  6527                         FACILITY:  [facility]   PHYSICIAN:  Ramiro Harvest, MD    DATE OF BIRTH:  [birth year]   DATE OF ADMISSION:  04/18/2007  DATE OF DISCHARGE:  04/23/2007                               DISCHARGE SUMMARY   PRIMARY CARE PHYSICIAN:  Dr. Tiburcio Pea of [facility].   CARDIOLOGIST:  Dr. Amil Amen of [facility].   DISCHARGE DIAGNOSES:  1. Congestive heart failure exacerbation.  2. Hypotension.  3. Coronary artery disease status post percutaneous transluminal      coronary intervention to the left anterior descending in 2006, now      status post percutaneous coronary intervention with bare metal      stent to the left anterior descending on April 21, 2007.  4. 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  5. Gastroesophageal reflux disease.  6. Hyperlipidemia.   DISCHARGE MEDICATIONS:  1. KCL 20 mEq p.o. daily.  2. Lasix  80 mg p.o. b.i.d.  3. K-Dur 20 mEq p.o. daily.  4. Nitroglycerin 0.4 mg sublingual q.5 minutes x3 p.r.n. chest pain.  5. Plavix 75 mg p.o. daily x30 days.  6. Aspirin 81 mg p.o. daily.  7. Iron sulfate 325 mg p.o. daily.  8. Caltrate one p.o. daily.  9. Ambien 10 mg p.o. at bedtime.  10.Vicodin 5/500 one tab p.o. q.4h. p.r.n.  11.Coreg 6.25 mg p.o. b.i.d.  12.Wellbutrin 300 mg XL p.o. daily.  13.Nexium 40 mg p.o. daily.  14.Ativan 1 mg p.o. p.r.n.  15.Gabapentin 600 mg p.o. q.i.d.  16.Promethazine 25 mg p.o. q.6h. p.r.n.  17.Multivitamin one tablet p.o. daily.  18.Vytorin 10/20 mg p.o. daily.  19.Detrol LA 4 mg p.o. daily.  20.Spironolactone 25 mg p.o. daily.   DISPOSITION AND FOLLOWUP:  Patient will be discharged home.  Patient is  to schedule a followup appointment with her primary care physician, Dr.  Tiburcio Pea, in 1 week where a CBC needs to be  drawn and patient's anemia  monitored to have transfusion goal for hemoglobin less than 8 to 9 or  per Cardiology recommendations.  Patient has had an extensive workup in  December of 2008, per Dr. Matthias Hughs, and is now basically transfusion-  dependent, supportive; hopefully after Plavix is taken off will no long  require this.  Patient will also need a BMET checked to monitor  patient's electrolytes and renal function.  Patient will also need to  follow up with Dr. Amil Amen on May 06, 2007, at 9 a.m.   CONSULTATION:  A Cardiology consult was done on April 20, 2007.  Patient was seen in consultation per Dr. Amil Amen, of [facility].   PROCEDURES PERFORMED:  1. Chest x-ray was performed on April 18, 2007, that showed      cardiomegaly and bilateral airspace disease with an appearance that      is most compatible with pulmonary edema.  Pneumonia is within      normal limits the  differential.  2. Chest x-ray done April 20, 2007, showed worsening bilateral      pulmonary edema and new right effusion.  3. Chest x-ray was done on April 22, 2007, that showed improved lung      aeration with resolving pulmonary edema, small effusions.  4. Chest x-ray was done on April 23, 2007, that showed improving      congestive heart failure and edema, there are small bilateral      effusions.  5. A 2D echo was done on April 19, 2007, that showed a moderately      dilated left ventricle, overall left ventricular systolic function      was markedly decreased, EF 20 to 25%, normal contraction of the      basal wall segments with otherwise generally decreased contraction      of severe degree, severe mitral valve regurgitation, left atrium      moderately dilated, right ventricle mildly dilated, right      ventricular systolic function mildly reduced, estimated peak right      ventricular systolic pressure was moderately increased, estimated      peak right ventricular systolic pressure was  in the range of 45 to      50 mmHg, severe tricuspid valvular regurgitation, right atrium      moderately dilated, inferior vena cava mildly dilated, there was      trivial pericardial effusion posterior to the heart.  There was a      large left pleural effusion.  6. Two units of packed red blood cells were transfused on January 18      through April 19, 2007.  7. A cardiac catheterization was performed, with percutaneous coronary      intervention with bare metal stent implantation to the proximal      LAD, was done on April 21, 2007, per Dr. Amil Amen of [facility]      [facility]iology.  The cardiac catheterization showed atherosclerotic      coronary vascular disease, single vessel, severe left ventricular      systolic dysfunction secondary to acute anterolateral wall MI of      September, 2008, viability demonstrated by resting thallium status      post bare metal stent implantation proximal LAD, typical angina was      reproduced with device insertion and balloon inflation, it was      exacerbated by dopamine which at its greatest dosage induced a      tachycardia of 120 beats per minute.   BRIEF HOSPITAL ADMISSION HISTORY AND PHYSICAL:  [name] is a  65 year old white female with past medical history significant for  coronary artery disease status post PTCI, congestive heart failure, EF  of 25%, obscure GI bleed who presented to the ED for worsening shortness  of breath.  Patient complained of a 2-3 week history of worsening  shortness of breath on exertion.  Patient endorsed symptoms of  orthopnea, stating that she slept upright.  Denied paroxysmal nocturnal  dyspnea or weight gain, has dyspnea with minimal activity, stating that  she can only ambulate approximately 10 feet before becoming short-  winded.  Patient denies chest pain or cough.  Patient came to the ED for  further evaluation.  The patient was recently hospitalized in December,  2008, for obscure GI bleed,  underwent complete GI evaluation with  endoscopy and capsule endoscopy, no definite source of bleeding was  seen.  It was recommended that she stay on chronic PPI  therapy and  receive blood transfusions as needed.  It was felt that she was safe to  continue on her aspirin and Plavix.  Patient had a heart catheterization  March 16, 2007, which showed stenosis at the site of previous stent  to the LAD.  A decision was made that patient undergo repeat heart  catheterization in January once GI bleed has been fully worked up.  The  patient is currently scheduled to get a stent placed on Wednesday.  In  the ED, patient was found to be profoundly anemic with a hemoglobin of  7.9 and received 1 unit of packed red blood cells.   PHYSICAL EXAM ON ADMISSION:  Temperature 98.3, blood pressure 81/51,  pulse of 97, respirations 18, oxygen 100% on 2L nasal cannula.  GENERAL:  The patient was an ill-appearing female who was alert and  oriented x3.  HEENT:  Normocephalic, atraumatic.  Pupils equal, round and reactive to  light.  Mucous membranes dry.  NECK:  Significantly elevated JV pressure up to the angle of the  mandible.  CARDIOVASCULAR:  Tachycardic but regular, no murmurs, rubs or gallops.  RESPIRATORY:  Bibasilar crackles.  ABDOMEN:  Positive bowel sounds, soft, nontender, nondistended.  EXTREMITIES:  2+ bilateral lower extremity edema.  NEUROLOGIC:  Patient was alert and oriented x3, cranial nerves II  through XII grossly intact, no focal deficits.  SKIN:  No rashes.  BACK:  No CVA tenderness.   ADMISSION LABS:  White count 8.7, hemoglobin 7.9, platelets 348,000.  BNP was 967.  Electrocardiogram showed normal sinus rhythm at 97 bpm, Q  waves inferiorly, ST depressions laterally in V5 and V6 . Chest x-ray  showed cardiomegaly with bilateral airspace disease consistent with  heart failure.   HOSPITAL COURSE:  1. Systolic CHF exacerbation.  Patient was admitted for CHF      exacerbation  secondary to chest x-ray findings, clinical findings      and an elevated BNP.  Patient was placed on 1.5L fluid, strict      I&Os, daily weights.  The patient's beta-blocker was held secondary      to her hypotension.  A 2D echo was obtained.  Cardiac enzymes were      cycled which were negative.  A 2D echo was obtained with results as      stated above.  Patient was kept on her aldactone and diuresed with      IV Lasix.  Cardiology was consulted as well and saw patient during      the hospitalization.  Patient improved slowly, was eventually      changed to oral Lasix.  Patient was stable and in improved      condition by day of discharge.  Patient was discharged home on p.o.      Lasix to follow up with her cardiologist on May 06, 2007.  2. Hypotension was felt to be secondary to patient's CHF exacerbation      vs. her anemia.  Patient was transfused in the ED and was      transfused 2 units of packed red blood cells.  Her blood pressure      medications were held and she was diuresed as well.  Patient's      pressure was monitored and patient was stable.  It seemed as if      patient did have a chronic low blood pressure.  Patient was      asymptomatic after she was transfused 2 units packed red  blood      cells.  Patient was in stable and improved condition by day of      discharge.  3. Coronary artery disease status post PTCI to the LAD in 2006 and is      status post PCI with bare metal stent to the proximal LAD April 21, 2007.  Patient during the hospitalization was maintained on      aspirin and Plavix as well as her statin.  Her beta-blocker was      held secondary to problem #2.  Patient had already had a scheduled      cath which was planned and as she was in the hospital Cardiology      was consulted, patient was seen in consultation and a      catheterization was done with PCI with bare metal stent done on      April 21, 2007, to the LAD with the hopes that  patient would be      on Plavix for only 1 month and Plavix will be discontinued      secondary to a history of GI bleed/INR deficiency anemia.  Patient      was stable throughout the hospitalization and the patient will be      discharged in stable and improved condition to follow up with her      cardiologist.  4. History of GI bleed/iron deficiency anemia thought to be secondary      to intermittent vascular ectasia.  As per extensive workup in      December of 2009, by Dr. Matthias Hughs, it was felt that patient will be      transfusion-dependent, will likely need weekly CBCs with a goal      transfusion of being hemoglobin between 8 to 9.  Patient was      maintained on her Nexium.  Patient was transfused 2 units during      this hospitalization.  Patient did not show any active or overt GI      bleed.  Patient was discharged in stable and improved condition.      The rest of patient's chronic medical issues were stable throughout      the hospitalization and she was maintained on her chronic medical      doses.  On day of discharge patient was in stable and in improved      condition.   VITAL SIGNS ON DAY OF DISCHARGE:  Temperature 97.5, pulse of 72, blood  pressure 95/64, respiratory rate 20, sating 97% on room air.   DISCHARGE LABS:  A BNP of 779.  A basic metabolic profile:  Sodium 137,  potassium 3.4, chloride 105, bicarb 26, glucose 89, BUN 12, creatinine  0.75 and a calcium of 9.2.  CBC on day of discharge:  White count was  6.3, hemoglobin 9.1, hematocrit 27.3 and a platelet count of 402,000.   Patient was discharged in stable and improved condition to follow up  with PCP as scheduled.  It was a pleasure taking care of Ms. [facility]      Ramiro Harvest, MD  Electronically Signed     DT/MEDQ  D:  05/18/2007  T:  05/18/2007  Job:  [id number] [id number]   cc:   Holley Bouche, M.D.  Francisca December, M.D.

## 2010-08-13 NOTE — Assessment & Plan Note (Signed)
[name] is back regarding her facial pain.  She did very well with the  Pamelor, once we increased it to 25 mg.  She is sleeping well.  Her pain  is 0-3/10.  It is made worse with the cold weather contacting the face.  She is also using the valproic acid at 1500 mg q.a.m.  She uses  hydrocodone 1 or 2 a day for breakthrough symptoms.   REVIEW OF SYSTEMS:  Notable for depression, loss of taste, occasional  bladder control problems due to her Lasix.  Other pertinent positives  are above and full review is in the written health and history section  of the chart.   SOCIAL HISTORY:  Unchanged.  She still living with her husband and  smokes occasionally.   PHYSICAL EXAMINATION:  VITAL SIGNS:  Blood pressure 104/59, pulse is 84,  and respiratory 18.  She is saturating 98% on room air.  GENERAL:  The patient is pleasant, alert, and oriented x3.  Affect is  bright and appropriate.  HEART:  Regular.  CHEST:  Clear.  ABDOMEN:  Soft and nontender.  NEUROLOGIC:  She has intact facial symmetry, but the right face is  decreased due to fine touch.  No skin color or temperature changes are  noted.  She was not drooling nor dysarthric today.  Cognitively she is  appropriate.   ASSESSMENT:  1. History of bilateral arthritis of the feet.  2. 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  3. Anxiety/bipolar disorder.   PLAN:  1. The patient has done well with Pamelor and we will continue this at      25 mg nightly.  2. Continue Neurontin and valproic acid as previously dosed.  3. Hydrocodone for breakthrough pain.  4. I will see her back in 3 months' time.  I am quite pleased with how      she is doing.      [name], M.D.  Electronically Signed     ZTS/MedQ  D:  04/17/2008 11:35:45  T:  04/18/2008 01:22:58  Job #:  [id number]   cc:   [facility]  [address]

## 2010-08-13 NOTE — Assessment & Plan Note (Signed)
[name] is back regarding her metatarsalgia. Her right foot has been  bothering her a bit more, but it seems to be proximal to her previous  pain was. I called her in a Medrol dose pack or two which seemed to be  of some benefit. She went to [facility] for Gamma knife surgery on her  right trigeminal neuralgia. This was done on July 17. She is still  having significant pain. Today, she rates her pain overall at a 6-7/10.  She is having pain in her left knee. She describes pain as sharp,  burning, dull, stabbing and intermittent. The pain interferes with  general activity, relations with others and enjoyment of life on a  moderate to severe level. Sleep is fair. She uses hydrocodone for  breakthrough pain.   She is on Lamictal 100 mg b.i.d. She is unsure if this is helping. She  is on Neurontin 600 mg t.i.d. and Elavil 25 to 50 mg q nightly.   REVIEW OF SYSTEMS:  Is notable for the above. Full review is in the  written Health and History section in the chart.   SOCIAL HISTORY:  Denies significant change today.   PHYSICAL EXAMINATION:  Blood pressure is 145/71, pulse is 87.  Respiratory rate is 18. She is satting 98% on room air. The patient is  pleasant. She remains anxious and sometimes tearful as she tends to work  herself up at times. She seems to have some antalgia on the right with  pain more so at the metatarsal tarsal junction of the first and second  rays. Right MTP and between the right first MTP and second MTP's were  less tender today. No swelling was seen. She had some tenderness at the  left knee. No facial changes were noted today. Right side of the face  was somewhat sensitive.  HEART: Regular.  CHEST: Clear.  ABDOMEN: Soft and nontender.   ASSESSMENT:  1. Chronic bilateral foot pain with history of      metatarsalgia/metatarsal phalangeal arthritis. The patient's pain      is more consistent with tendinitis and bursitis today in the      proximal foot.  2. History of  anxiety/bipolar disorder.  3. Trigeminal neuralgia.  4. Gout.   PLAN:  1. Will increase Neurontin to 600 mg q.i.d.  2. Decrease Lamictal to 50 mg b.i.d. as she is unsure that this has      helped.  3. Replace Elavil with imipramine 50-75 mg q nightly.  4. Hydrocodone for breakthrough pain.  5. After informed consent, we injected the right metatarsal tarsal      junction with 40 mg of Kenalog and 2 cc of 1% lidocaine. The      patient tolerated well.  6. Encouraged appropriate shoe wear. She needs to try ice and      stretching to the foot as well.  7. I will see her back in about two months time.      [name], M.D.  Electronically Signed     ZTS/MedQ  D:  10/27/2006 10:55:59  T:  10/27/2006 21:35:56  Job #:  [id number]   cc:   Royetta Crochet, MD  Fax: [phone]

## 2010-08-13 NOTE — Assessment & Plan Note (Signed)
[name] is back regarding her foot pain. Her right foot seems do very  nicely. She had some increased pain in the left foot, but then worked on  elevation, ice and anti-inflammatory and the pain has improved. She is  doing fairly well in respect to her trigeminal nerve irritation. She  uses the Vicodin p.r.n. for break through pain control. She is also  using Neurontin and Elavil.  She found out the Imipramine 50 mg h.s.  helped her sleep.   REVIEW OF SYSTEMS:  The patient denies any new issues other than those  mentioned above. Full reviews in the written health history section.   SOCIAL HISTORY:  Is without change.   PHYSICAL EXAMINATION:  VITAL SIGNS Blood pressure is 120/73. Pulse is  95. Respiratory rate 18. She is sating 98% on room air.  GENERAL The patient is pleasant, alert and oriented x 3. She had  improved gait with no specific antalgia today.  EXTREMITIES: The left foot was nontender with passive and active moments  in rotation, eversion, inversion and flexion extension today. No skin  cut or temperature changes were noted. Pulses were 2+.  HEART: Was regular.  CHEST: Was clear.  ABDOMEN: Was soft and nontender.  FACIAL EXAM: Was stable.   ASSESSMENT:  1. Bilateral foot pain right greater than left consistent with      metatarsal phalangeal arthritis and metatarsalgia.  2. History of anxiety/bipolar disorder.  3. History of trigeminal neuralgia.  4. Gout.   PLAN:  1. No changes were made today. We discussed preventive measures and      treatment measures for periodic foot pain including ice, elevation,      anti-inflammatory medications, proper shoe wear, etc.  2. Continue Neurontin and Tofranil as written.  3. I gave her Hydrocodone refill today #120.  4. I will see her back in approximately four months' time.      [name], M.D.  Electronically Signed     ZTS/MedQ  D:  11/25/2006 12:28:10  T:  11/26/2006 07:31:32  Job #:  [id number]

## 2010-08-13 NOTE — H&P (Signed)
NAMEDIORA, BELLIZZI NO.:  [id number]   MEDICAL RECORD NO.:  [id number]          PATIENT TYPE:  INP   LOCATION:  1833                         FACILITY:  [facility]   PHYSICIAN:  Therisa Doyne, MD    DATE OF BIRTH:  1945/12/02   DATE OF ADMISSION:  04/18/2007  DATE OF DISCHARGE:                              HISTORY & PHYSICAL   This is an admission for [facility] hospitalist service.   PRIMARY CARE Taseen Marasigan:  Dr. Tiburcio Pea.   CHIEF COMPLAINT:  Dyspnea on exertion, shortness of breath.   HISTORY OF PRESENT ILLNESS:  65 year old white female with past medical  history significant for coronary artery disease, status post PTCI,  congestive heart failure with an ejection fraction of 25%, and obscure  GI bleeding, who presents to the emergency department for worsening  shortness of breath.  The patient complains of 2-3 weeks of worsening  dyspnea on exertion and shortness of breath.  She endorses symptoms of  orthopnea, and stating that she sleeps upright.  She denies paroxysmal  nocturnal dyspnea or weight gain.  She has dyspnea with minimal  activity, stating that she can only ambulate approximately 10 feet  before coming short-winded.  She denies chest pain or cough.  She came  to the emergency department for further evaluation.   Recently, she was hospitalized in December 2008 for obscured GI  bleeding.  She underwent complete GI evaluation with endoscopy and  capsule endoscopy, and thus no definitive source of bleeding was seen.  It was recommended that she stay on chronic proton pump therapy, and  receive blood transfusions as needed.  It was felt that she was safe to  continue on her aspirin and Plavix.   The patient had a heart catheterization on March 16, 2007, which  showed stenosis at the site of her previous stent to the left anterior  descending artery.  The decision was made that she would undergo repeat  heart catheterization in January, once her GI  bleeding had been fully  worked up.  She is currently scheduled to get her stent placed on  Wednesday.   In the emergency department, the patient was found to be profoundly  anemic with a hemoglobin of 7.9, and she received 1 unit of blood  transfusion.   PAST MEDICAL HISTORY:  1. Coronary artery disease with previous intervention to left anterior      descending artery, currently scheduled to have repeat cardiac      catheterization on Wednesday.  2. Congestive heart failure with an ejection fraction of 25%.  3. Iron deficiency anemia.  4. Hyperlipidemia.  5. Gastroesophageal reflux disease.   SOCIAL HISTORY:  The patient lives at home with her husband.  She denies  tobacco, alcohol or drugs.   FAMILY HISTORY:  Positive for coronary artery disease.   REVIEW OF SYSTEMS:  All systems reviewed and are negative except as  mentioned above in history of present illness.   ALLERGIES:  No known drug allergies.   MEDICATIONS:  1. Potassium chloride 20 mEq daily.  2. Lasix 80 mg daily.  3. Iron  sulfate 325 mg t.i.d.  4. Aspirin 81 mg daily.  5. Caltrate daily.  6. Ambien 10 mg nightly.  7. Plavix 75 mg daily.  8. Vicodin p.r.n.  9. Aldactone 25 mg daily.  10.Coreg 6.25 mg b.i.d.  11.Wellbutrin 300 mg XL daily.  12.Nexium 40 mg daily.  13.Ativan p.r.n.  14.Vytorin 10/20 daily.  15.Neurontin 600 mg b.i.d.  16.Detrol LA 4 mg daily.   PHYSICAL EXAM:  VITAL SIGNS:  Temperature 98.3, blood pressure 81/51,  pulse 97, respirations 18, oxygen saturation 100% on 2 L nasal cannula.  GENERAL:  This is an ill-appearing female who is alert and oriented x3.  HEENT:  Dry mucous membranes.  Pupils equal, round, react to light and  accommodation.  NECK:  Significantly elevated jugular venous pressure up to the angle of  the mandible.  CARDIOVASCULAR:  Tachycardic but regular.  No murmurs, rubs, or gallops.  No S3 was heard.  CHEST:  Bibasilar crackles.  ABDOMEN:  Positive bowel sounds.   Soft, nontender, nondistended.  EXTREMITIES:  2+ lower extremity edema.  Warm feet.  NEUROLOGIC:  Alert and oriented x3.  Cranial nerves II-XII are grossly  intact with no focal deficits.  SKIN:  No rashes.  BACK:  No CVA tenderness.   LABORATORY DATA:  White blood cell count 8.7, hemoglobin 7.9, platelets  348, BNP fairly unremarkable.  Brain natriuretic peptide elevated at  967.   STUDIES:  1. EKG showed normal sinus rhythm at 97 beats per minute.  Q-waves      inferiorly.  ST depressions laterally in V5 and V6.  2. Chest x-ray:  Cardiomegaly with bilateral air space disease,      consistent with heart failure.   ASSESSMENT AND PLAN:  65 year old white female with a past medical  history significant for chronic obscured gastrointestinal bleeding,  currently on aspirin and Plavix,coronary artery disease, and congestive  heart failure, who presents with decompensating congestive heart failure  exacerbation.   1. We will admit the patient to [facility] hospitalists service to the step-      down unit for close observation.   1. Congestive heart failure exacerbation.  This is evidence by her      elevated BNP, chest x-ray and elevated jugular venous pressure.      She does appear to be in decompensated heart failure, and is      currently hypotensive.  For that reason, we will hold her beta-      blocker.  We will diurese the patient with 40 mg of IV Lasix once,      and this schedule this b.i.d.  We will monitor her ins and outs      closely, and titrate her Lasix as needed.  Check daily weights and      check transthoracic echocardiogram.  We will all other      antihypertensives, because she is currently hypotensive.  Will rule      her out for myocardial infarction with serial cardiac enzymes and      continue aldactone 25 mg daily.   1. Coronary artery disease, status post percutaneous transluminal      coronary intervention to the left anterior descending in 2006.  She      is  currently scheduled to undergo repeat cardiac catheterization      with planned intervention to a stenosis at the site of her previous      left anterior descending stent.  We are holding her beta-blocker,  but we will continue her aspirin, Plavix and statin.  We will rule      the patient out for myocardial infarction.   1. History of gastrointestinal bleed.  She did receive 1 unit of      packed red blood cells in the emergency department.  We will check      a CBC in the morning.  Continue her on aspirin and Plavix, iron and      Nexium.   1. Fluids, electrolytes and nutrition.  Saline lock intravenous      fluids.  Fluid-restrict the patient to 1.5 L fluid per day.      Electrolytes are stable.  Low-salt diet.   1. Prophylaxis.  Gastrointestinal prophylaxis with Nexium.  For deep      venous thrombosis prophylaxis sequential compression devices.      Therisa Doyne, MD  Electronically Signed     SJT/MEDQ  D:  04/18/2007  T:  04/19/2007  Job:  [id number]

## 2010-08-13 NOTE — Op Note (Signed)
[name], [name]               ACCOUNT NO.:  [id number]   MEDICAL RECORD NO.:  [id number]          PATIENT TYPE:  INP   LOCATION:  5502                         FACILITY:  [facility]   PHYSICIAN:  Bernette Redbird, M.D.        DATE OF BIRTH:   DATE OF PROCEDURE:  03/29/2007  DATE OF DISCHARGE:                               OPERATIVE REPORT   PROCEDURES:  Small bowel enteroscopy.   INDICATIONS:  [age]-year-old female with heme-positive stool and  small bowel capsule endoscopy showing apparent vascular ectasia.  This  unfortunate lady developed anemia requiring transfusions a couple of  years ago when a stent was placed and she was transiently on Plavix,  then did well for a while off the Plavix, then had a massive heart  attack in October of this year was placed back on aspirin and Plavix.  Since then has required frequent transfusions, perhaps every couple of  weeks, often a couple of units at a time by her report.  Colonoscopy by  Dr. Bosie Clos 2 years ago was negative.  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  She has been on PPI therapy in the  interim, and remains on aspirin and Plavix.   A small bowel capsule endoscopy done yesterday showed several areas in  the small bowel of apparent angioectasia verses erosions.   The patient has a low cardiac ejection fraction on the order of 25-30%.   FINDINGS:  Normal exam to the proximal jejunum.   PROCEDURE:  The nature, purpose, risks the procedure had been discussed  with the patient who provided written consent.  The patient was brought  in a fasted state from her hospital room to the endoscopy unit.  Sedation was fentanyl 25 mcg and Versed 4 mg IV which led to just mild  sedation, probably due to the fact that she takes Ambien on a regular  basis and has chronic pain.   In any event, she tolerated the sedation well and there was no  problem  with arrhythmias, desaturation, or clinical instability during the  course the procedure.   The Pentax pediatric video colonoscope was used as an entry scope for  this procedure, and was passed blindly into the esophagus which was  easily entered.  The esophagus was endoscopically normal except for a  small hiatal hernia with widely patent esophageal mucosal ring  (Schatzki's ring).   The stomach contains a small amount of clear amber bile, no blood or  coffee-ground material.  The stomach was free of any erosions, ulcers,  polyps, masses or vascular ectasia at this time, including a retroflexed  view of the cardia.  The pylorus, duodenal bulb and second duodenum  looked normal.   The scope was advanced as far as it could comfortably be advanced, what  I believe was proximal jejunum, since there was a tight turn toward the  end of the exam, probably corresponding to the ligament of Treitz region  of the small bowel.  In 1 area I  thought there was a small reddish  puddle, possibly consistent of some blood tinged serosanguineous fluid.  This was irrigated.  I could not see any definite blood in that area nor  any underlying lesion such as an AVM or an erosion.   The scope was removed from the patient.  No interventions were  performed.  She tolerated the procedure well and there no apparent  complications.   IMPRESSION:  1. Essentially normal exam.  2. Small hiatal hernia with Schatzki's ring.  3. No evident blood or coffee-ground material in the upper tract at      this time.  4. No prospective bleeding lesions seen.  In particular, no angina      ectasia or residual ulceration as noted on endoscopy a week ago,      following Proton pump inhibitors therapy.   I believe that this patient's blood loss is probably from intermittent  vascular ectasia, although transient erosive changes from her aspirin  would also be possible.   RECOMMENDATIONS:  1. Chronic PPI  therapy.  2. If it is ever safe to do so, it would be ideal to stop her blood      thinners such as aspirin and Plavix, since they seemed to correlate      strongly with her history of bleeding, according to the history she      provides me.  3. In the meantime, I am afraid we are going to be stuck simply with      transfusion support for this patient.  Given her cardiac      instability, it would probably be most prudent to do this on a      regular scheduled basis rather than waiting for her to become      symptomatic and then transfuse her.  For example, she could have a      CBC checked on a weekly basis with anticipation of a medical day      care transfusion to be set up thereafter, unless the hemoglobin is      above a certain acceptable level such as 9 or 9.5.  To facilitate      this, permanent or semi permanent vascular access such as a PICC      line or a Port-A-Cath or a Hickman catheter might be worthy of      consideration.           ______________________________  Bernette Redbird, M.D.     RB/MEDQ  D:  03/29/2007  T:  03/29/2007  Job:  [id number]   cc:   Francisca December, M.D.  Lauretta I. Odogwu, M.D.

## 2010-08-13 NOTE — Consult Note (Signed)
[name], [name]               ACCOUNT NO.:  [id number]   MEDICAL RECORD NO.:  [id number]          PATIENT TYPE:  INP   LOCATION:  2807                         FACILITY:  [facility]   PHYSICIAN:  [name], M.D.  DATE OF BIRTH:  1945/08/29   DATE OF CONSULTATION:  04/20/2007  DATE OF DISCHARGE:                                 CONSULTATION   REFERRING PHYSICIAN:  Hollice Espy, M.D.   REASON FOR CONSULTATION:  Known coronary disease, severe recurrent  anemia.   HISTORY OF PRESENT ILLNESS:  [name] is a 65 year old woman well-  known to me with a previous history of coronary disease dating to 2005  with a DE stent implantation in the proximal anterior descending artery.  She did well until September 2008 with the exception of chronic GI  bleeding requiring intermittent transfusion.  In September 2008 she  underwent an intracranial trigeminal nerve release.  Her Plavix had been  discontinued for 5-7 days.  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  She  required intubation and mechanical ventilation for transient severe  pulmonary edema.  She was ultimately discharged with severe LV  dysfunction, EF in the range of 25-30%.  She has experienced  intermittent bouts of heart failure and is continued on chronic Plavix.  Her GI bleeding has been more severe and required more frequent  transfusions.  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  There is no  direct treatment available for this.   She was admitted on April 18, 2007, with worsening dyspnea initially  felt to be due to heart failure, but it improved significantly after a 2-  unit transfusion.  Her admission hemoglobin was 7.9.  We had already  planned to perform percutaneous intervention today, which we are  proceeding with.  She did have 2 units of packed red blood cells with a  resultant hemoglobin yesterday of 10.2.   As mentioned, the anterior wall was found to be viable by rest thallium  techniques.  A previous coronary angiogram has revealed subtotal in-  stent restenosis.   The anticipation is that restoration of normal LAD flow will improve the  likelihood of return of function in the anterior wall and, hopefully,  the apex, with an increase in her LV function.  We will also be able to  treat the lesion in such a fashion that will we will be able to stop  Plavix within 1 month, hopefully obviating the need for future frequent  transfusion.   PAST MEDICAL HISTORY:  1. As mentioned above.  2. Depression.  3. Arthritis.  4. Dyslipidemia.  5. GERD.  6. Protein-calorie malnutrition.  7. 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   FAMILY HISTORY:  Significant for coronary disease but not at an early  age.   SOCIAL HISTORY:  No alcohol or tobacco use.  She is married.  Her  husband frequent accompanies her in the hospital.  She is not currently  working.   CURRENT MEDICATIONS:  1. Aspirin 81 mg daily.  2. Wellbutrin 300 mg p.o. daily.  3. Plavix 75 mg p.o. daily.  4. Furosemide 40 mg p.o. b.i.d.  5. Neurontin 600 mg a.c. and h.s.  6. Protonix 80 mg p.o. daily.  7. Zocor 20 mg p.o. daily.  8. Aldactone 25 mg p.o. daily.   DRUG ALLERGIES:  None known.   REVIEW OF SYSTEMS:  Negative except as mentioned above.  She does have  chronic pain in the lower back and major weightbearing joints of the  lower extremities from her arthritis.   PHYSICAL EXAMINATION:  Blood pressure is 90/48, and this is chronically  the case with [name].  Her pulse is 89, respiratory rate 17, temperature  100.2 previously, now 96.9.  GENERAL:  She is a pleasant but somewhat pale-appearing 65 year old  woman.  She does appear older than her stated age.  She is pleasant, in  no distress.  HEENT:  Unremarkable.  Head is  atraumatic and normocephalic.  Pupils are  equal, round, reactive to light.  Extraocular movements are intact.  Sclerae are anicteric.  The oral mucosa is pink and moist.  Tongue is  not coated.  NECK:  Supple without thyromegaly or masses.  The carotid upstrokes are  normal.  There is no bruit.  There is no JVD.  CHEST:  Clear with adequate excursion.  Normal vesicular breath sounds  are heard throughout.  The precordium is quiet.  Normal S1-S2 is heard.  No S3-S4, murmur,  click or rub noted.  ABDOMEN:  Soft, nontender, positive bowel sounds throughout.  No midline  pulsatile mass.  EXTERNAL GENITALIA:  Without lesions.  RECTAL:  Not performed.  EXTREMITIES:  Full range of motion.  No edema.  Intact distal pulses.  NEUROLOGICALLY:  No focal findings except dysesthesia in the left face.  Gait is intact.  SKIN:  Warm, dry and clear.   ACCESSORY CLINICAL DATA:  A CK, MB and troponin have been negative x2.  A BNP as in the 1000-1300 range.  Hemoglobin as mentioned above.  White  blood cell count 7000.  Serum electrolytes, BUN, creatinine and glucose  all within normal limits.  The chest x-ray shows cardiomegaly with mild  pulmonary edema and bilateral airspace disease.  Electrocardiogram shows  sinus rhythm, nonspecific ST-T wave abnormality diffusely.   ASSESSMENT:  1. Dyspnea, felt secondary to severe anemia and a component of heart      failure.  2. Coronary artery disease as described extensively above.  3. History of iron-deficiency anemia, again as described extensively      above.  She did receive IV iron in [place] as well.  4. Heart failure currently, being treated with IV furosemide.  5. Dyslipidemia.  6. Facial neuralgia.   PLAN/RECOMMENDATION:  1. Agree with your management thus far.  The patient appears stable at      this time.  Do need to repeat the chest x-ray.  Will proceed with      planned intervention on April 21, 2007.  Goals, risks and      alternatives  have been      discussed.  The patient states her understanding, has had questions      answered, and wishes to proceed.  2. Chronic hypotension precludes the use of ACE inhibitor and beta      blocker, but perhaps we will reattempt this in the future at very  low dose as it would help with left ventricular positive left      ventricular remodeling.      [name], M.D.  Electronically Signed     JHE/MEDQ  D:  04/21/2007  T:  04/21/2007  Job:  [id number]   cc:   Eartha Inch, MD

## 2010-08-13 NOTE — Assessment & Plan Note (Signed)
[name] is back regarding her pain complaints.  Her right foot is doing  fairly well.  She had a flare 2 or 3 weeks ago, but took some Tylenol  Arthritis and this seems to have helped.  She has overall had very good  results with the injection.  She most probably complaints of low back  pain.  She states that similar to pain she had prior to her surgery.  Pain is mostly in the low back with some radiation into the thigh area.  She had her back surgery about 4 years ago.  The patient states her pain  interferes with general activity, relations with others, enjoyment of  life on a moderate-to-severe level.  Pain is generally sharp in nature  and worsens with prolonged sitting as well as bending, sometimes  standing.  She denies any frank weakness in the legs.   REVIEW OF SYSTEMS:  Notable for decreased taste, anxiety, depression,  spasms, numbness, weakness.  She has had suicidal thoughts in the past,  but not actively.  Full review is in the written health and history  section.  Other pertinent positives are above.   SOCIAL HISTORY:  Unchanged.   PHYSICAL EXAMINATION:  Blood pressure is 93/53, pulse is 73, respiratory  rate 18, she is sating 98% on room air.  The patient is pleasant, alert  and oriented x3.  She is again wearing thin sandals today.  She has no  swelling in either foot today.  Knee range of motion is good.  She has  postoperative scars of both knees.  Straight leg testing was equivocal  on the right, negative on the left.  [name] test was equivocal on the  right.  She had negative compression test.  She did have pain in the  right PSIS area.  Hip abduction cause some discomfort today.  She had  pain with bending to the right as well as slightly with rotation.  Extension and facet maneuvers caused some discomfort.  Area was slightly  tender on the postoperative side, although it seemed to be more pain  today, but more prominent distally particularly at the PSIS area on  the  right and below to the posterior and lateral regions of the hip.  Heart  is regular.  Chest is clear.  Abdomen is soft, nontender.  Cranial nerve  exam is intact.   ASSESSMENT:  1. History of osteoarthritis of bilateral feet.  2. 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  3. Anxiety with bipolar disorder.  4. Lumbar post laminectomy syndrome, questionable right sacroiliac      joint dysfunction.  May even have some facet involvement.   PLAN:  1. I think we need to stay conservative here.  Recommended outpatient      physical therapy to work on low back, pelvic and lower extremity      range of motion, strengthening and posture with modalities as      indicated.  She also needs to assume home exercise program.  2. Continue with the Neurontin and valproic acid as well as Voltaren      b.i.d.  3. Again urged the patient to wear better shoe wear on a continual      basis.  4. Continue nortriptyline 25-50 mg nightly with hydrocodone 10/650 one      q.6. h. p.r.n. #60.  This was refilled on August 5.  5. I will see her back in about 2-3 months pending progress with      therapy above.  [name], M.D.  Electronically Signed     ZTS/MedQ  D:  11/07/2008 13:05:14  T:  11/08/2008 00:13:07  Job #:  [id number]

## 2010-08-13 NOTE — H&P (Signed)
Tracey, Ware               ACCOUNT NO.:  [id number]   MEDICAL RECORD NO.:  [id number]          PATIENT TYPE:  INP   LOCATION:  3735                         FACILITY:  [facility]   PHYSICIAN:  Lonia Blood, M.D.      DATE OF BIRTH:  07-26-1945   DATE OF ADMISSION:  02/09/2007  DATE OF DISCHARGE:                              HISTORY & PHYSICAL   PRIMARY CARE PHYSICIAN:  Medical laboratory scientific officer.   CARDIOLOGY:  Dr. Amil Amen.   PRESENTING COMPLAINT:  Chest pain and shortness of breath.   HISTORY OF PRESENT ILLNESS:  The patient is a 65 year old white female  with complicated medical history who was brought in from [facility] where she has been a resident since January 25, 2007.  The patient was discharged back from [facility] after  hospitalization.  She has known history of coronary artery disease and  diastolic dysfunction and CHF.  She is status post stent that was placed  a year ago by Dr. Amil Amen.  The patient started experiencing chest pain  this morning that has progressed and worsened.  Associated with that is  shortness of breath.  She received a number of nitroglycerin tablets at  the nursing home, but no relief.  Hence, she was brought to the  emergency room.  The patient is a poor historian, but currently denies  any chest pain.  She has some mild nausea, but no vomiting, no  diaphoresis and no radiation of her pain.   PAST MEDICAL HISTORY:  1. Coronary artery disease with some ischemic cardiomyopathy.      However, her last EF was reported at 60%.  2. Protein calorie malnutrition.  3. 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  4. Depression.  5. Chronic iron deficiency anemia status post multiple workup by Dr.      Bosie Clos.  6. Dyslipidemia.  7. GERD.   ALLERGIES:  The patient has no known drug allergies.   MEDICATIONS:  1. Vicodin 7.5/750 one tablet q.6 h.  2. Toprol XL 75 mg daily.  3. Nexium 40 mg daily.  4. Nitroglycerin  0.4 mg p.r.n. every 5 minutes.  5. Plavix 75 mg daily.  6. Wellbutrin 75 mg t.i.d.  7. Ferrous sulfate 325 mg t.i.d.  8. Multivitamins daily.  9. Prevacid 40 mg daily.  10.Potassium chloride 20 mEq twice a day.  11.Vasotec 2.5 mg b.i.d.  12.Vitamin C 500 mg b.i.d.  13.Albuterol two puffs as needed.  14.Aspirin 325 mg daily.  15.Lasix 20 mg daily.  16.Lorazepam 1 mg twice a day.  17.Aldactone 25 mg daily.   SOCIAL HISTORY:  The patient is married but currently lives at [facility].  Denied any ongoing tobacco use.  She has history of  tobacco use but she says she quit this week.  Denied alcohol or IV drug  use.   FAMILY HISTORY:  Significant for coronary artery disease.   REVIEW OF SYSTEMS:  Mainly generalized aches, swelling in her feet and  stomach.  Otherwise, a 12-point review of systems is per HPI.   PHYSICAL EXAMINATION:  VITAL SIGNS:  Temperature initially 100.6, blood  pressure 81/47, currently 92/60, pulse 99-101, respiratory rate 22, sats  96% on room air on 2 liters.  GENERAL:  The patient looks acutely ill-looking, anxious but in no acute  distress.  HEENT: PERRL.  EOMI.  No visible jaundice.  No pallor.  NECK:  Supple with visible JVDto the angle of the jaw.  RESPIRATORY:  She has good air entry bilaterally with widespread  crackles, especially at the bases.  CARDIOVASCULAR:  The patient is mildly tachycardiac, but no audible  murmur, no gallop.  ABDOMEN:  Distended, soft, nontender with positive bowel sounds.  EXTREMITIES:  Show 2+ pedal edema but no signs of cyanosis or clubbing.   LABORATORY DATA:  Sodium 136, potassium 3.4, chloride 102, glucose 98,  BUN 6, creatinine 0.6.  Initial cardiac enzymes are negative.  White  count 14.5, hemoglobin 10.5, platelet count 503, Albumin 2.8.  LFTs  otherwise normal.  BNP 1406.  Urinalysis showed urine is positive for  nitrite, positive leukocyte esterase, many bacteria, WBCs 3-6.  EKG  showed sinus  tachycardia with a rate of 111 normal intervals with  significant ST-T wave changes involving the septal and lateral and  inferior leads.  EKG is significantly different from previous on January 11, 2006.  Chest x-ray showed pulmonary edema.  Otherwise, no  infiltrates.   ASSESSMENT:  Therefore, this is a 65 year old female brought in from  nursing home with known cardiac history presenting with what appears to  be pulmonary edema, urinary tract infection and chest pain.  Most likely  the patient is having acute congestive heart failure exacerbation which  may have been triggered by her urinary tract infection or conversely she  may have had an acute coronary syndrome that pushed her over the edge  and now she is having flash pulmonary edema from enema.  The patient is  also hypotensive making treatment a little bit more worrisome.   PLAN:  1. CHF exacerbation.  Will admit the patient to tele bed.  I will      start on aggressive diuresis while checking 2-D echo.  Continue      with beta blockers and ACE inhibitors but hold them for systolic      blood pressure below 100.  Continue with nitro sublingual since the      patient's chest pain has disappeared and blood pressure is      relatively low.  Once the patient is stabilized, we will get      Cardiology consult on the patient once more.  I will monitor her      urine output as well as strict I's and O's.  2. UTI.  I will treat the patient empirically with Rocephin while      awaiting culture.  3. Anemia.  This is chronic anemia.  The patient has had workup      before.  I will continue with some iron therapy at this point.  4. Hypokalemia.  I will replete her potassium and keep her on daily,      potassium supplementation.  5. GERD.  I will continue PPI as previous doses.  6. Chronic pain.  I will continue with OxyContin as well as Dilaudid      while in the hospital and will resume Vicodin when she is      discharged.   Otherwise, further treatment will depend on results of      the patient's labs and how she  responds in the hospital.      Lonia Blood, M.D.  Electronically Signed     LG/MEDQ  D:  02/09/2007  T:  02/10/2007  Job:  [id number]

## 2010-08-13 NOTE — Procedures (Signed)
NAMELEQUITA, MEADOWCROFT               ACCOUNT NO.:  [id number]   MEDICAL RECORD NO.:  [id number]          PATIENT TYPE:  REC   LOCATION:  TPC                          FACILITY:  [facility]   PHYSICIAN:  [name], M.D.DATE OF BIRTH:  [birth year]   DATE OF PROCEDURE:  DATE OF DISCHARGE:                               OPERATIVE REPORT   PROCEDURE:  Major joint injection, diagnostic code 715.17,  osteoarthritis of the right foot.   The patient had recent x-rays of her feet.  The left foot was notable  for degenerative changes at the first metatarsal and first cuneiform  space.  This coincides with the patient's most of severe pain.  After  informed consent, preparation of skin with Betadine and local  anesthetic, we injected 30 mg of Kenalog and 2 mL 1% lidocaine into the  first metatarsal cuneiform area.  The patient tolerated without any side  effects or bleeding complications.  The area was cleaned and dressed.  She was given post-injection instructions.  I will see her back in about  3 months' time for followup assessment.      [name], M.D.  Electronically Signed     ZTS/MEDQ  D:  08/11/2008 13:18:43  T:  08/12/2008 02:47:14  Job:  [id number]

## 2010-08-13 NOTE — Op Note (Signed)
Tracey, Ware               ACCOUNT NO.:  [id number]   MEDICAL RECORD NO.:  [id number]          PATIENT TYPE:  INP   LOCATION:  2923                         FACILITY:  [facility]   PHYSICIAN:  Francisca December, M.D.  DATE OF BIRTH:  Jul 18, 1945   DATE OF PROCEDURE:  DATE OF DISCHARGE:                               OPERATIVE REPORT   PROCEDURES PERFORMED:  1. Left subclavian venogram.  2. Coronary sinus venogram.  3. Insertion 3 chamber implantable cardiac defibrillator.  4. Insertion left ventricular lead for biventricular pacing.  5. Defibrillation threshold testing.   INDICATIONS:  [name] is a 65 year old woman with a severe  ischemic cardiomyopathy, LVEF 25%-30% by a recent 2D echocardiogram and  MUGA and left ventricular radionuclide angiogram.  She has chronic class  III systolic heart failure and a nonspecific interventricular conduction  delayed producing a QRS duration of 140 milliseconds.  She was therefore  brought to the catheterization laboratory at this time for insertion of  a biventricular ICD for sudden death prophylaxis and treatment of her  heart failure.   PROCEDURAL NOTE:  The patient was brought to the cardiac catheterization  laboratory in the fasting state.  The left prepectoral region was  prepped and draped in the usual sterile fashion.  Local anesthesia was  obtained with the infiltration of 1% lidocaine with epinephrine  throughout the left prepectoral region.  A left subclavian venogram was  then performed with a peripheral injection of 20 mL of Omnipaque.  A  digital cineangiogram was obtained in the AP projection and road  mapped to guide future left subclavian puncture.  The venogram did  demonstrate the left subclavian vein to be widely patent and coursing in  a normal fashion over the anterior surface of the first rib and beneath  the middle third of the clavicle.  There was no evidence for persistence  of the left superior vena cava.   A 7-8 cm incision was made in the deltopectoral groove, and this was  carried down by sharp dissection and electrocautery to the prepectoral  fascia.  A pocket was then performed using blunt dissection and  electrocautery.  The pocket was then packed with 1% kanamycin-soaked  gauze.  Three separate left subclavian punctures were performed with an  18-gauge thin-wall needle through which was passed a 0.035-inch tight J  guidewire.  Over the initial guidewire, an 8-French tearaway sheath and  dilator were advanced.  The dilator and wire were removed, and the right  ventricular lead was advanced to the level of the right atrium.  The  sheath was torn away.  Using standard technique and fluoroscopic  landmarks, the lead was manipulated into the right ventricular apex.  This was an active fixation lead, and the screw was advanced as  appropriate.  It was tested for adequate pacing parameters, and this was  reported below.  It was also tested for diaphragmatic pacing at 10 volts  and none was found.  The lead was then sutured into place using three  separate 0 silk ligatures.  Over the second guidewire, a 7-French  tearaway  sheath and dilator were advanced.  The dilator and wire were  removed, and the atrial lead was advanced to the level of right atrium.  The sheath was then torn away.  Using standard technique and  fluoroscopic landmarks, the lead was manipulated into the right atrial  appendage.  Again, this was an active fixation lead and the screw was  advanced as appropriate.  It was subsequently tested for adequate pacing  parameters, and these are reported below.  It was also tested for  diaphragmatic pacing at 10 V and none was found.  The lead was then  sutured into place using three separate 0 silk ligatures.  Over the  remaining guidewire which was along the device, a St. Jude 135 cm  coronary sinus guide was advanced with the dilator.  The dilator was  removed.  The tight J  guidewire was allowed to remain in place.  Extensive manipulations with this combination was not successful in  cannulating the coronary sinus.  Therefore I removed that guidewire and  used a Wholey wire.  This was successful in cannulating the coronary  sinus.  Coronary sinus venograms were then performed with a balloon-  occluding catheter in the LAO and RAO projections, by hand injection.  The venograms did document a large coronary sinus and a relatively large  left lateral ventricular vein.  This was subsequently cannulated with a  0.014-inch Luge intracoronary guidewire.  The 4-French bipolar St. Jude  left ventricular catheter was then advanced over this wire and  positioned in the distal portion of the lateral vein without difficulty.  Excellent pacing parameters were obtained, this will be noted below.  It  was also tested for diaphragmatic and chest wall pacing at 10 V and none  was found.  The guiding catheter was then removed by the slit technique,  and the lead was sutured into place using three separate 0 silk  ligatures.  The kanamycin-soaked gauze was removed the pocket, and the  pocket was copiously irrigated using 1% kanamycin solution.  The leads  were then attached to the pace shock generator, carefully identifying  each and placing each into the appropriate receptacle under the  supervision of a St. Jude representative.  Each lead was tightened into  place and tested for security.  The leads were then wound beneath the  pace shock generator and the device was placed in the pocket.  A 0 silk  anchoring suture was applied to the pectoralis muscle.   We then prepared for defibrillation threshold testing.  After achieving  adequate moderate sedation with a combination of intravenous Valium,  fentanyl, Dilaudid, finally 12.5 mg of Phenergan, the patient had  induction of ventricular fibrillation by the shock on T technique.  The  dysrhythmia was promptly detected.  The  device charged and delivered a  20 joules shock within 7-8 seconds.  There was successful and prompt  return of sinus rhythm.   The pocket was then closed using 2-0 Vicryl in a running fashion with  subcutaneous tissue.  Two layers were applied.  The skin was  approximated using 4-0 Vicryl in a running subcuticular fashion.  Steri-  Strips and a sterile dressing were applied, and the patient was  transported to the recovery area in stable condition.   EQUIPMENT DATA:  The pace shock generator is a St. Jude Promote RF,  model [id number], serial [id number].  The atrial lead is a St. Jude  OptiSense, model [id number], serial [id number].  The RV  lead is a St. Jude  [place], model [id number], serial [id number].  The left ventricular lead is a  St. Jude QuickFlex, model [id number], serial [id number].   PACING DATA:  The atrial lead detected at 5.3 mV P-wave.  The pacing  threshold was 0.8 V at 0.5 msec pulse width.  The impedance was 595 ohms  resulting in a current at capture threshold of 1.7 mA.  The right  ventricular lead detected at 12.1 mV R-wave.  The pacing threshold was  0.8 V at 0.5 msec pulse width.  The impedance was 894 ohms resulting in  a current at capture threshold of 1.0 mA.  The left ventricular lead had  a threshold of 0.8 V at 0.5 msec pulse width.  The impedance was 1070  ohms resulting in a current at capture threshold of 1.0 mA.  Again, each  lead was tested for diaphragmatic pacing at 10 V and none was found.      Francisca December, M.D.  Electronically Signed     JHE/MEDQ  D:  07/28/2007  T:  07/29/2007  Job:  [id number]   cc:   Melida Quitter, M.D.

## 2010-08-13 NOTE — Discharge Summary (Signed)
[name], [name]               ACCOUNT NO.:  [id number]   MEDICAL RECORD NO.:  [id number]          PATIENT TYPE:  INP   LOCATION:  5502                         FACILITY:  [facility]   PHYSICIAN:  Francisca December, M.D.  DATE OF BIRTH:  [birth year]   DATE OF ADMISSION:  03/23/2007  DATE OF DISCHARGE:  03/31/2007                               DISCHARGE SUMMARY   DISCHARGE DIAGNOSES:  1. Iron deficiency anemia, status post transfusions.  2. Antral ulcer.  3. Congestive heart failure resolved.  4. Known coronary artery disease of the LAD.  5. Hypotension asymptomatic.  6. Depression.  7. Arthritis with chronic pain.  8. Dyslipidemia.  9. GERD.  10.Protein calorie malnutrition.  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   HOSPITAL COURSE:  [name] is a 65 year old female with known coronary  artery disease who underwent a drug-loading stent to the LAD in  September, 2006.  Recurrent dyspnea and fatigue showed a positive  viability study in the same distribution area.  A recent outpatient  catheterization on March 16, 2007 showed stenosis at the stent site.  Dr. Amil Amen performed the pre cath work-up and this showed profound  anemia for which she is now being admitted for so that she may receive  blood transfusions.  Of note, she has recently had a significant  complicated history in November of 2008 of being admitted to [facility] for right sided trigeminal neuralgia.  She underwent a right  suboccipital craniotomy for microvascular decompression of the 5th  cranial nerve.  Complicating factors developed during the course of her  hospital stay.  She suffered an ST elevated myocardial infarction in the  PACU but was not felt to be a catheterization candidate due to her  craniotomy.  She went into shock requiring an intraaortic balloon pump  transiently and ultimately an echo was performed that showed an EF of  25% with global hypokinesis.  Repeat echo one month later showed an EF  of 20 to 30%  with essentially dyskinesis all over with the exception of  normal basal wall contractions.  Moderate to severe MR.   The patient was admitted on March 23, 2007 for blood transfusion.  A  hematology consultation and GI consultation were obtained.  The patient  has known chronic iron deficiency anemia.  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  There  was a linear antral ulcer.  Dr. Quentin Angst felt the patient had scattered  anterior dysplasia as the source of her chronic ongoing iron deficiency  anemia.  A small bowel enteroscopy was performed and this was  essentially normal with the exception of a small hiatal hernia with a  Schatzki's ring.  Dr. 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  There were no definite bleeding sources.  He recommended chronic PPI therapy and transfusion support as needed.  He felt that the patient should be on blood thinners as well as Plavix  if safe to do so.   The patient  does need to have an intervention to her LAD and will need  to remain on Plavix and aspirin until this is performed.  We did discuss  this with Dr. Matthias Hughs.   On March 31, 2007, we felt the patient was ready to be discharged to  home.  Her systolic pressure never really got over 90 but she was  asymptomatic and we felt it was time for her to go home with follow up  with Dr. Amil Amen in one week with a CBC.  We did order a am cortisol  level as she was leaving and these results are pending and need to be  evaluated at her next office visit.   DISCHARGE MEDICATIONS:  1. Iron 325 mg three times a day.  2. Potassium 20 mEq a day.  3. __________  25 mg a day.  4. Metoprolol ER 25 mg a day.  This is on hold because of her      hypotension.  5. She is to resume Plavix 75 mg a day.  6. Wellbutrin 300 mg a day.  7. Detrol LA 4 mg a  day.  8. Vytorin 10/40 mg one tablet a day.  9. Nexium as prior to admission.  10.Baby aspirin 81 mg a day.  11.Neurontin 600 mg four times a day.  12.Lasix 80 mg twice a day.  13.Ambien as needed.  14.Hydrocortisone as needed.  15.Albuterol as needed.  16.Phenergan as needed.  17.Lorazepam as needed.   PLAN:  She is to followup with the hematologist and she is to make an  appointment with Dr. Dalene Carrow.  Follow up with Dr. Amil Amen on April 15, 2007 at 10:10 a.m.  She is to go to the lab 30 minutes before the  appointment to get a CBC.  She is to increase activity slowly.  Call for  any questions or concerns.      Guy Franco, P.A.      Francisca December, M.D.  Electronically Signed    LB/MEDQ  D:  03/31/2007  T:  03/31/2007  Job:  [id number]   cc:   Bernette Redbird, M.D.  Lauretta I. Odogwu, M.D.

## 2010-08-13 NOTE — H&P (Signed)
NAMELAVERE, Tracey               ACCOUNT NO.:  [id number]   MEDICAL RECORD NO.:  [id number]          PATIENT TYPE:  INP   LOCATION:  4731                         FACILITY:  [facility]   PHYSICIAN:  [name], MDDATE OF BIRTH:  [birth year]   DATE OF ADMISSION:  10/28/2007  DATE OF DISCHARGE:                              HISTORY & PHYSICAL   CHIEF COMPLAINT:  Shortness of breath.   HISTORY OF PRESENT ILLNESS:  [name] is a 65 year old white female  with multiple medical problems who was seen by a nurse practitioner in  [name] today.  She had multiple complaints.  Apparently, she initially had presented, I think a week ago with leg  edema.  This was apparently not felt to be a major issue and was felt to  be a bruise.  However, she has had worsening shortness of breath over  the past few days.  She denies any swelling other than as above.  She  has had no chest pain.  She has chronic anemia and has had transfusions  several times a month.  Her hematologist is Dr. Dalene Ware.  Apparently, she  has had small bowel enteroscopy, capsule endoscopy, EGD, and colonoscopy  all without explanation for her chronic anemia.  She has a history of  coronary artery disease and congestive heart failure with systolic  dysfunction.  She has had a cough which has been nonproductive.  Over  the past week, she felt very hot several days ago but did not take her  temperature.  She has had no vomiting or diarrhea.  She has had no  dysuria.  She had a temperature of 100.3 in the Cardiology office today.  Currently, she is complaining of chronic pain from her trigeminal  neuralgia and also complaining of hunger.  She denies any change in her  medication.  She reports compliance with her medications.  There is a  note from the Cardiology office that reports that she had not been  compliant with her sodium restriction recently.  She had labs drawn in  the office and showed a hemoglobin of  7.4 and abnormal liver function  tests which is unusual for her.  She takes Vicodin 7.5/750 and is also  on Vytorin.  She reports that she has had no right upper quadrant pain,  but did notice that she did have tenderness on the right upper quadrant  abdominal exam in the office today.   PAST MEDICAL HISTORY:  1. History of MI and stent to the LAD.  2. ICD/biventricular pacemaker ischemic cardiomyopathy with pre-      pacemaker ejection fraction of 25%, post-pacemaker ejection      fraction of 35%.  3. Iron deficiency anemia requiring frequent transfusions and followed      by Dr. Dalene Ware.  4. Hyperlipidemia.  5. Gastroesophageal reflux disease.  6. Chronic pain.  7. Trigeminal neuralgia.  8. Depression.   MEDICATIONS:  1. Gabapentin 600 mg p.o. 4 times a day.  2. Furosemide 80 mg p.o. b.i.d.  3. Carvedilol 6.25 mg p.o. b.i.d.  4. Spironolactone 25 mg a day.  5. KCl 20 mEq daily.  6. Lorazepam 1 mg 2-3 times a day as needed.  7. Iron sulfate 325 mg p.o. daily.  8. Aspirin 81 mg a day.  9. Vytorin 10/20 mg a day.  10.Ambien 10 mg a day.  11.Wellbutrin 300 mg a day.  12.Ramipril 2.5 mg a day.  13.Vicodin 7.5/750 as needed.   SOCIAL HISTORY:  The patient is married.  She does not smoke.  She  drinks occasionally.  No history of drug use.   FAMILY HISTORY:  Reviewed and as per previous dictations.   REVIEW OF SYSTEMS:  Difficult as the patient does not answer all of my  questions but reviewed and as above, otherwise negative.   PHYSICAL EXAMINATION:  VITAL SIGNS:  Temperature is 100, pulse 91,  respiratory rate 20, blood pressure 92/66, and oxygen saturation 93% on  room air.  GENERAL:  The patient appears somewhat short of breath with talking.  HEENT:  Normocephalic, atraumatic.  Pupils equal, round, and reactive to  light.  Sclerae nonicteric.  Slightly dry mucous membranes.  NECK:  She does have JVD.  No carotid bruits.  No thyromegaly.  LUNGS:  She has rales on the  right lung two-thirds of the way up.  No  rales on the left.  No wheeze or rhonchi.  CARDIOVASCULAR:  Regular rate and rhythm without murmurs, gallops, or  rubs.  ABDOMEN:  Normal bowel sounds, soft, nontender, and nondistended.  GU/RECTAL:  Deferred.  EXTREMITIES:  No clubbing, cyanosis, or edema.  She does have some  bruising.  Pulses are intact.  NEUROLOGIC:  The patient is a bit sleepy but oriented and cranial  nerves, sensory, and motor exam are grossly intact.  PSYCHIATRIC:  The patient is for the most part cooperative but somewhat  resistant to answering questions.   LABORATORY DATA:  From the office, white blood cell count is 9000,  hemoglobin 7.4, hematocrit 21.7, MCV 89, platelet count 375, 77%  neutrophils, and 7.4% lymphocytes.  Glucose 123, BUN 15, creatinine 1.1,  sodium 132, potassium 4.6, chloride 97, bicarbonate 22, calcium 8.9,  total protein 7.0, and albumin 2.8.  Total bilirubin 1.0, alkaline  phosphatase 233, AST 162, ALT 97, and BNP 1800.  According to the nurse  practitioner, her BNP usually runs around 1000.  On last hospital  discharge, it was about 600.  Echocardiogram from September 30, 2007 shows a  limited study but ejection fraction 30%-35% up from 20%-25% pre-ICD and  pacemaker.  EKG shows normal sinus rhythm with ventricular pacing.   ASSESSMENT AND PLAN:  1. Anemia, chronic iron deficiency without etiology that I know of.  I      will give a transfusion of 2 units of packed red blood cells and      repeat the H&H.  2. Fever.  Check chest x-ray.  She does have rales on the right side      and I am concerned about pneumonia.  We will also check a repeat      CBC with diff, blood cultures, and clean catch urinalysis.  She has      elevated LFTs and although is not very tender, cholecystitis may be      within the differential.  3. Fever, see above.  4. Increased LFTs.  I will get an acetaminophen level to rule out      acetaminophen toxicity.  This may be  hepatic congestion or biliary      obstruction.  5. Congestive  heart failure exacerbation.  I will give IV Lasix and      hopefully the blood transfusion will help.  She will be on a low-      salt diet, get strict inputs and outputs, and continue outpatient      medications.  She will get daily weights done.  6. I am told by the nurse that she is experiencing some chest pain      currently, although she denied earlier.  I will check cardiac      enzymes and repeat an EKG.  7. Chronic pain.  Hold Tylenol products for now.  8. Trigeminal neuralgia.  She will get Dilaudid as needed for now.  9. Coronary artery disease.  10.Ischemic cardiomyopathy with systolic dysfunction, status post      automatic implantable cardioverter defibrillator and biventricular      pacemaker placement.  11.Gastroesophageal reflux disease.  12.Depression.  13.History of anxiety.      Tracey L. Lendell Caprice, MD  Electronically Signed     CLS/MEDQ  D:  10/28/2007  T:  10/29/2007  Job:  [id number]   cc:   Francisca December, M.D.  Lauretta I. Odogwu, M.D.  Autumn Patty, M.D.  Bernette Redbird, M.D.

## 2010-08-13 NOTE — Discharge Summary (Signed)
NAMEJOLANDA, [name]               ACCOUNT NO.:  [id number]   MEDICAL RECORD NO.:  [id number]          PATIENT TYPE:  INP   LOCATION:  4731                         FACILITY:  [facility]   PHYSICIAN:  Corinna L. Lendell Caprice, MDDATE OF BIRTH:  1945/05/24   DATE OF ADMISSION:  10/28/2007  DATE OF DISCHARGE:  11/01/2007                               DISCHARGE SUMMARY   DISCHARGE DIAGNOSES:  1. Anemia.  2. Congestive heart failure exacerbation.  3. Increased LFTs, possibly secondary to hepatic congestion.  4. Newly diagnosed hypothyroidism.  5. Hyponatremia.  6. Acute on chronic systolic heart failure.  7. Urinary tract infection.  8. Coronary artery disease with history of stent.  9. Implantable cardioverter-defibrillator/biventricular pacemaker.  10.Ischemic cardiomyopathy with ejection fraction of 35%.  11.Iron deficiency anemia, chronic.  12.Hyperlipidemia.  13.Gastroesophageal reflux disease.  14.Trigeminal neuralgia.  15.Depression.  16.Chronic pain.  17.Chronic hypotension.   DISCHARGE MEDICATIONS:  1. Ciprofloxacin 500 mg p.o. b.i.d. until gone.  2. Levothyroxine 25 mcg daily.  3. Stop Vytorin 10/20 mg a day.  4. Avoid acetaminophen products and alcohol.  5. Continue gabapentin 600 mg p.o. q.i.d.  6. Furosemide 80 mg p.o. b.i.d.  7. Carvedilol 6.25 mg p.o. b.i.d.  8. Spironolactone 25 mg p.o. daily.  9. K-Dur 20 mEq a day.  10.Lorazepam 1 mg two to three times a day as needed.  11.Ferrous sulfate 324 mg a day.  12.Aspirin 81 mg a day.   1. Ambien 10 mg nightly as needed.  2. Wellbutrin 300 mg a day.  3. Ramipril 2.5 mg a day.   DIET:  Low-salt.   ACTIVITY:  Ad lib.   Follow up with Dr. Leonides Sake within a week at which time hemoglobin  and liver function tests should be checked.  She will also need repeat  TSH in 3 months.  Follow up with Dr. Amil Amen in 2-4 weeks.   CONDITION:  Stable.   CONSULTATIONS:  None.   PROCEDURES:  None.   LABORATORY DATA:   Urinalysis showed large leukocyte esterase, negative  nitrites, negative protein, negative blood, rare epithelial cells,  hyaline casts, 21-50 white cells, 0-2 red cells, and few bacteria.  Urine culture grew out 100,000 colonies of multiple bacterial  morphotypes.  Blood cultures negative.  Acetaminophen level less than  10.  Urine osmolality 392.  Urine sodium 10.  A.m. cortisol was 17.9.  TSH was 5.190.  Free T4 was normal at 1.19 but free T3 was low at 2.0.  BNP on admission was 1587.  Cardiac enzymes negative.  On admission,  sodium was 128, at discharge is 130.  BMET otherwise unremarkable.  Alkaline phosphatase on admission 277, SGOT 159, SGPT 116 with a normal  bilirubin and albumin was 2.9.  At discharge, her alkaline phosphatase  is 3.58, SGOT 46, SGPT 71,  albumin was 2.3, and total bilirubin 2.1.  CBC on admission was significant for a hemoglobin of 8.0, hematocrit  24.5, and MCV was 91.  At discharge, her hemoglobin has been stable at  9.2.   SPECIAL STUDIES/RADIOLOGY:  EKG showed baseline artifact but appears to  be  in sinus rhythm with ventricular pacing.  Chest x-ray showed  cardiomegaly with mild interstitial edema.  Ultrasound of the abdomen  showed no stones, thickening, and possible edema of gallbladder wall  with a tiny amount of pericholecystic fluid.  Hepatobiliary scan was  negative.   HISTORY AND HOSPITAL COURSE:  [name] is a 65 year old white female  with multiple medical problems was directly admitted from Dr. Amil Amen'  office with a fever and anemia.  She had a temperature of 100.3 in the  office and a hemoglobin of 7.4.  She has chronic anemia requiring  several transfusions a month.  Apparently, this has been worked up  extensively by Dr. Dalene Carrow and [facility].  The patient had a temperature  of 100 degrees on admission here and a blood pressure of 92/66, and  oxygen saturation 93% on room air.  She runs chronically low blood  pressures in the 80s and  90s.  She was short of breath with talking and  had rales on the right.  She was felt to have CHF exacerbation secondary  to the anemia and given 3 units of packed red blood cells.  Her Lasix  was increased.  Her urinalysis showed urinary tract infection and she  was started on Cipro.  Her LFTs also were elevated and she was started  for a short time on Flagyl.  She had been nauseated and had some right  upper quadrant tenderness which was mild.  The HIDA scan, however is  negative and the Flagyl has been stopped.  She was afebrile at the time  of discharge, had clear lungs, was no longer short of breath, and was  feeling better.   Due to her increased LFTs, I have instructed her not to take any  acetaminophen products and to stop her Vytorin.  She will need a repeat  hemoglobin and liver function tests with her followup appointment with  Dr. Tiburcio Pea.   Her TSH was high and free T3 was low and she was started on a low dose  of levothyroxine.  This could have contributed as well to her congestive  heart failure exacerbation.  On the day of discharge, she was requesting  discharge and was feeling much better.   Hemoccults of the stools were ordered but have not been collected.   Total time on the day of discharge is 40 minutes.      Corinna L. Lendell Caprice, MD  Electronically Signed     CLS/MEDQ  D:  11/01/2007  T:  11/02/2007  Job:  [id number]   cc:   Francisca December, M.D.  Holley Bouche, M.D.  Lauretta I. Odogwu, M.D.

## 2010-08-13 NOTE — Discharge Summary (Signed)
NAMETASMIA, BLUMER               ACCOUNT NO.:  [id number]   MEDICAL RECORD NO.:  [id number]          PATIENT TYPE:  INP   LOCATION:  3735                         FACILITY:  [facility]   PHYSICIAN:  Lonia Blood, M.D.      DATE OF BIRTH:  02-27-1946   DATE OF ADMISSION:  02/09/2007  DATE OF DISCHARGE:  02/12/2007                               DISCHARGE SUMMARY   PRIMARY CARE PHYSICIAN:  [facility].   DISCHARGE DIAGNOSES:  1. Systolic dysfunction congestive heart failure exacerbation.  2. Coronary artery disease.  3. Depression and anxiety.  4. Hypokalemia.  5. Gastrointestinal reflux disease.  6. Anemia.  7. Hypotension.   DISCHARGE MEDICATIONS:  Aspirin 325 mg daily, bupropion 75 mg t.i.d.,  Plavix 75 mg daily, Vasotec 25 mg daily, Nexium 40 mg daily, ferrous  sulfate 325 mg daily, Vicodin 7.5/750 1 tablet q.6h., Ativan 1 mg twice  a day, Toprol XL 50 mg one and one-half tablets daily, multivitamin 1  tablet daily, potassium 20 mEq daily, Aldactone 25 mg p.o. b.i.d., Lasix  40 mg b.i.d.  Ativan was changed to 2 mg t.i.d.   DISPOSITION:  The patient was discharged home with current therapy.  She  refused to go back to her SNF and family opted to give her care at home  with home health.   PROCEDURES PERFORMED:  Chest X-Ray:  On 02/09/2007, showed cardiomegaly,  effusions and congestion most consistent with CHF.   CONSULTATIONS:  None.   BRIEF HISTORY AND PHYSICAL:  Please refer to my dictated history and  physical on admission.  In short, however, the patient was a 65 year old  white female with multiple medical problems all brought in secondary to  having worsening shortness of breath at the nursing facility.  The  patient's BNP on admission was 4106.  She had evidence of CHF on chest x-  rays, as well as, clinical findings in the ER.  Her EKG was essentially  within normal.  She had 2+ pedal edema also on admission.  There was  also evidence of urinary tract  infection.  She was subsequently admitted  for further management.   HOSPITAL COURSE:  1. Acute CHF exacerbation:  The patient was admitted, diuresed      aggressively.  She had a 2-D echo ordered that showed an EF of 25-      30% with some dyskinesis of the mid to distal anterolateral wall,      also, mid to distal septal wall with normal wall contractions.      There was some mitral valve regurgitation as well and left pleural      effusion.  The patient improved with diuresis as indicated.  Dr.      Amil Amen was her cardiologist.  He was informed of the patient's      admission.  She compensated prior to discharge is to follow with      [facility] for [facility] management.  She was discharged on ACE      inhibitors, beta blockers as well as diuretic.  2. Coronary artery disease:  The patient's cardiac  enzymes were      checked serially and were negative indicating that there was no      evidence of MI at this point.  3. Depression and anxiety:  Her medications were titrated.  The dose      of Ativan changed to control her anxiety disorder.  4. GERD.  Again, the patient was continued on PPI as in the hospital.  5. Hypotension:  She had transient hypotension with systolic blood      pressures in the 90s but was asymptomatic.  Blood pressure lowering      medications were initially held but later resumed when her blood      pressure stabilized.  Otherwise, the patient was doing fine      including a UTI which was treated at the time of discharge.      Lonia Blood, M.D.  Electronically Signed     LG/MEDQ  D:  03/31/2007  T:  03/31/2007  Job:  [id number]

## 2010-08-13 NOTE — Consult Note (Signed)
NAMEMACIEL, KEGG NO.:  [id number]   MEDICAL RECORD NO.:  [id number]          PATIENT TYPE:  INP   LOCATION:  5502                         FACILITY:  [facility]   PHYSICIAN:  Graylin Shiver, M.D.   DATE OF BIRTH:  1945-12-26   DATE OF CONSULTATION:  03/23/2007  DATE OF DISCHARGE:                                 CONSULTATION   REASON FOR CONSULTATION:  The patient is a 65 year old white female who  is in for pre-cath evaluation.  She was found to have a significant  anemia.  The patient has known history of iron deficiency anemia in the  past and has been evaluated in the past.  She has been on iron.  She was  previously evaluated by Dr. Bosie Clos.  She was last seen by him in the  office February 11, 2006.  As part of evaluation for her anemia in  December 2006, she had a negative colonoscopy and an EGD which showed  grade B erosive esophagitis and a clean base small antral ulcer.  It was  mentioned that if anemia recurred, to consider capsule endoscopy.  The  patient has not seen any signs of GI bleeding.  She has been taking iron  and her stools have been a dark, blackish color.  She denies vomiting or  hematemesis.   She was recently at [facility] for some facial nerve surgery,  suffered complications after that and had a acute MI.  This MI,  according to her, was on December 30, 2006.   In reviewing her labs, on February 12, 2007, her hemoglobin and  hematocrit were 10.1 an 30.  On March 10, 2007, hemoglobin and  hematocrit 6.7 and 20.9.  On March 23, 2007, hemoglobin and  hematocrit 7.9 and 24.6.   ALLERGIES:  None known.   MEDICAL PROBLEMS:  1. Anemia.  2. Dyslipidemia.  3. GERD.  4. Hiatal hernia.  5. Coronary artery disease.  6. Low cardiac ejection fraction.  7. Facial neuralgia.   REVIEW OF SYSTEMS:  Currently not complaining of chest pain or shortness  of breath.   PHYSICAL EXAMINATION:  GENERAL APPEARANCE:  She is in no  distress.  HEENT:  Nonicteric.  HEART:  Regular rhythm.  No murmurs.  LUNGS:  Clear.  ABDOMEN:  Soft, nontender, no hepatosplenomegaly.   IMPRESSION:  1. Recurring iron deficiency anemia.  2. History of esophagitis and a small gastric ulcer.  3. History of a normal colonoscopy two years ago.  4. Multiple medical problems as stated above.   PLAN:  Cardiology is requesting reevaluation of this patient's anemia.  At this point, I would recommend proceeding with an EGD and if negative,  consider doing a small bowel enterography and also possibly a capsule  endoscopy.  I do not think she needs another colonoscopy at this time  given the fact that she had a normal colonoscopy two years ago.  I think  that the yield would be low.           ______________________________  Graylin Shiver, M.D.     SFG/MEDQ  D:  03/23/2007  T:  03/24/2007  Job:  [id number]   cc:   Vicente Serene I. Odogwu, M.D.  Shirley Friar, MD

## 2010-08-13 NOTE — Op Note (Signed)
[name], Ware               ACCOUNT NO.:  [id number]   MEDICAL RECORD NO.:  [id number]          PATIENT TYPE:  INP   LOCATION:  5502                         FACILITY:  [facility]   PHYSICIAN:  Petra Kuba, M.D.    DATE OF BIRTH:  [birth year]   DATE OF PROCEDURE:  03/27/2007  DATE OF DISCHARGE:                               OPERATIVE REPORT   PROCEDURE:  Capsule endoscopy.   INDICATIONS:  Subacute GI bleeding.   PROCEDURE:  This was performed by the Endo nurse, and the computer  pictures were downloaded and given to me for my report.   ASSESSMENT:  Please see the written report for details; but basically it  was difficult to say for sure when the capsule left the stomach and  entered the duodenum (due to some increased fluid).  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  The  capsule did stay in the cecum for a prolonged period of time.  No active  bleeding was seen.   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   PLAN:  Okay to follow-up with Dr. Evette Cristal to decide endoscopy and ERBE  argon plasma coagulator on the AVMs seen.  If that procedure went well,  might even suggest he take the pediatric colonoscope and go further down  her duodenum; and try ERBE as many as he could, and then see how she  does.  Trying to minimize long-term blood thinners as much as possible,  and follow for signs of rebleeding.  Okay to go home from a GI  standpoint.  To follow up with Dr. Evette Cristal to decide all the above and  review the plan and how to proceed.           ______________________________  Petra Kuba, M.D.     MEM/MEDQ  D:  03/27/2007  T:  03/28/2007  Job:  [id number]

## 2010-08-13 NOTE — Assessment & Plan Note (Signed)
FOLLOW-UP OFFICE NOTE   [name] is back regarding her metatarsalgia.  She is complaining or more  trigeminal neuralgia symptoms as well over the last couple of months.  She saw Dr. Nash Shearer regarding this, who had an MRI/ performed which was  negative.  It appears they talked about gamma knife treatments.  She is  on Neurontin 300 mg t.i.d. for this and was off her Lamictal for a while  and has resumed using the generic dosage of 25 mg.  She is not clearly  sure this is helping.  She has come off the Elavil per psychiatry's  adjustment of medications earlier in the year.  She rates her pain a 4-  5/10 with variability from day to day.  Her facial pain hurts more with  activity and talking, chewing.  The right foot bothers her more with  walking, particularly toe-off.  The patient uses hydrocodone 7.5 one q.  8 h p.r.n. for breakthrough pain.   REVIEW OF SYSTEMS:  On review of systems, patient reports continues  depression and anxiety.  It appears she has intermittent nausea and has  had some weight gain of late.  She seemed fixated on her weight gain  today.   SOCIAL HISTORY:  The patient reports no changes.   PHYSICAL EXAMINATION:  VITAL SIGNS:  Blood pressure is 130/80, pulse is  100, respiratory rate is 16.  GENERAL:  She is alert and oriented times 3.  Patient is a bit anxious  and talkative today.  EXTREMITIES:  She has some antalgia to the right side.  She has pain  along the right first MTP and second MTP joints.  There seems to be some  pain more proximally up the dorsum of the foot as well to a lesser  extent, definitely worsened with weight bearing and toe-off in gait.  No  skin color or temperature changes were noted.  Pulses are 2+.  HEART:  Regular.  CHEST:  Clear.  ABDOMEN:  Soft, nontender.  FACIAL EXAM:  Stable with no obvious outward signs today.  Face was a  bit sensitive along the jaw line.   ASSESSMENT:  1. Chronic bilateral foot pain, right greater than left  with      metatarsal phalangeal arthritis/metatarsalgia.  2. History of anxiety/bipolar disorder.  3. History of trigeminal neuralgia with increase of late.  4. Gout.   PLAN:  I am not sure that she needs to go as far as a gamma knife  treatment at this point.  I think we can manage her with medications,  particularly since some of her medications were stopped and other ones  are submaximal.  1. Continue Lamictal, which we just restarted at 100 mg b.i.d.  2. Increase Neurontin over a week and a half time to 600 mg t.i.d.  3. Resume Elavil 25-50 mg q. HS.  4. Hydrocodone for breakthrough pain.  5. I will send patient for a metatarsal bar or pad at [facility].  I think this may help some of her      walking pain.,  6. I will see the patient back in 2 months' time.      [name], M.D.  Electronically Signed     ZTS/MedQ  D:  09/01/2006 10:22:15  T:  09/01/2006 10:35:55  Job #:  [id number]   cc:   Royetta Crochet, MD  Fax: [phone]

## 2010-08-13 NOTE — Assessment & Plan Note (Signed)
[name] is back regarding her pain complaints.  Her face has been  generally stable at this point.  She has had some recent problems with  her feet.  Dr. Tiburcio Pea had x-rays done of her feet which showed arthritis  once again.  Her pain is 2-4/10.  She was taking some naproxen for  awhile and felt this is beneficial, but Dr. Tiburcio Pea was concerned about  her being on this long term due to her cardiac history.  Pain is sharp,  burning, aching.  Her Oswestry score is 50% today.  Pain interferes with  general activity, relations with others, enjoyment of life on a moderate  level.  Sleep is fair.   REVIEW OF SYSTEMS:  Notable for occasional numbness in the face and  head.  She has some hypersensitivity over the feet and lower leg.  She  has occasional depression and anxiety.  She is placed on some Seroquel  recently apparently per Dr. Amador Cunas which may cause a little bit of  fatigue and confusion.   SOCIAL HISTORY:  Unchanged.  She is married.  No new findings were  noted.   PHYSICAL EXAMINATION:  VITAL SIGNS:  Blood pressure is 104/68, pulse 73,  respiratory rate 18, she is sating 100% on room air.  GENERAL:  The patient is pleasant, alert and oriented x3.  Affect is  bright and appropriate.  She is wearing thin sandals today.  Toes are  slightly painful at the first MTP area and the second as well to a  lesser extent.  No swelling or skin discolorations noted.  Temperature  is normal.  She has good pulses.  HEART:  Regular.  CHEST:  Clear.  ABDOMEN:  Soft, nontender.  NEUROLOGIC:  Cranial nerve exam is normal.  Cognition is appropriate.   ASSESSMENT:  1. History of bilateral osteoarthritis of the feet.  2. 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  3. Anxiety/bipolar disorder.   PLAN:  1. We will add Voltaren gel b.i.d. to t.i.d. for the feet and knees.      I think she can use this with some success and avoid systemic side      effects.  2. Continue Neurontin and valproic acid.   Seroquel per Dr.      Amador Cunas.  3. The patient is using hydrocodone once or twice a day for      breakthrough pain which is fine.  4. Discussed appropriate shoe wear.  I think she should try some other      types of athletic shoes including A6 that may be a better fit from      an insole cushioning and support standpoint.  5. We will see her back in about 3 months' time.  Overall, she is      doing quite well.      [name], M.D.  Electronically Signed     ZTS/MedQ  D:  07/10/2008 09:53:04  T:  07/11/2008 00:28:10  Job #:  [id number]

## 2010-08-13 NOTE — Op Note (Signed)
[name], HIRT               ACCOUNT NO.:  [id number]   MEDICAL RECORD NO.:  [id number]          PATIENT TYPE:  INP   LOCATION:  5502                         FACILITY:  [facility]   PHYSICIAN:  Graylin Shiver, M.D.   DATE OF BIRTH:  March 24, 1946   DATE OF PROCEDURE:  03/24/2007  DATE OF DISCHARGE:                               OPERATIVE REPORT   PROCEDURE:  Esophagogastroduodenoscopy.   INDICATIONS FOR PROCEDURE:  Recurring anemia.  The patient has had prior  EGD with finding of gastric ulceration and esophagitis done by Dr.  Bosie Clos in the past.  The patient also had a normal colonoscopy.  The  patient presents again with iron-deficiency anemia.   Informed consent was obtained after explanation of the risks of  bleeding, infection and perforation.   PREMEDICATION:  Fentanyl 25 mcg IV, Versed 4 mg IV.   PROCEDURE:  With the patient in the left lateral decubitus position, the  Pentax gastroscope was inserted into the oropharynx and passed into the  esophagus.  It was advanced down the esophagus, then into the stomach  and into the duodenum.  In the second or third portion of the duodenum,  I did see a focal bright red spot on the mucosa compatible with  angiodysplasia.  In the stomach, there was a small linear antral ulcer.  In the body of the stomach, there was a focal reddish area, again,  consistent with a small focal area of angiodysplasia.  There was  retained food in the stomach.  No other abnormalities were seen.  The  esophagus was normal.  She tolerated the procedure well without  complications.   IMPRESSION:  1. Linear antral ulcer.  2. 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  3. Focal area of angiodysplasia in the distal duodenum.   COMMENTS:  I suspect that this patient may have scattered angiodysplasia  in her intestinal tract as the source of her chronic ongoing iron-  deficiency anemia.  I would recommend that we do a CT enterography to  rule  out a small bowel lesion and then a capsule endoscopy.  Her upper  GI angiodysplasia can be treated with the argon plasma coagulator.  I  would like to have her off the Plavix for awhile before doing that,  however, and make sure that the other tests, the CT angiography and  capsule endoscopy, do not show any significant abnormalities.           ______________________________  Graylin Shiver, M.D.    SFG/MEDQ  D:  03/24/2007  T:  03/24/2007  Job:  [id number]   cc:   Francisca December, M.D.  Dominga Ferry, MD  Shirley Friar, MD

## 2010-08-13 NOTE — Assessment & Plan Note (Signed)
I last saw Dhwani back regarding her right foot and facial pain in  August 2008.  We really did not primarily manage her trigeminal  neuralgia.  Since I have seen her, essentially she has had a Gamma Knife  surgery as well as microvascular decompression of her right trigeminal  nerve.  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  She  was in the hospital in a skilled nursing facility afterwards.  The end  result is that her pain is a bit different, but now prominent in the  right lower middle and upper face as related to trigeminal nerve injury.  She rates her pain overall 0-6 out of 10.  Pain can be intermittent, but  stabbing and sharp burning.  The pain interferes with her general  activity, relations with other, enjoyment of life in a moderate-to-  severe level.  Pain is worse, when she is active in speaking.  The air  movement, tucked out stimulation, etc can worsen pain.  She sleeps  fairly well with her Ambien.  She is on Neurontin 800 mg q.i.d., and  valproic acid 1000 mg a day currently with Vicodin 10/650 for  breakthrough pain.  She had no further issues related to heart over the  last few months.  This is followed closely, however, by Dr. Corliss Marcus.   REVIEW OF SYSTEMS:  Notable for weight loss overall, after this ordeal  as well as constipation.  Mood has been fair.  Although she remains  anxious and depressed at times.  A full review is in the written health  and history section of the chart.   SOCIAL HISTORY:  The patient is married and living with her husband.  She still smokes and occasionally drinks.   PHYSICAL EXAMINATION:  VITAL SIGNS:  Blood pressure 107/56, pulse 71,  respiratory rate 18, she is sating 99% on room air.  GENERAL:  The patient is generally pleasant, alert and oriented x3.  She  is walking stably, actually wearing high heel shoes today.  HEART:  Regular.  CHEST:  Clear.  ABDOMEN:  Soft, and nontender.   Weight is down  noticeably.  She is generally appropriate although has  some mild dysarthria.  She can be anxious at times.  Right face is  sensitive to touch.  She is intact facial movements overall.  No  drooling was seen.  No changes in color and temperature were appreciated  in the face.  Cognitively, she was appropriate.   ASSESSMENT:  1. Bilateral foot pain with arthritis, which seems to be improved.  2. Trigeminal neuralgia with perioperative nerve injury and persistent      right-sided facial pain.  3. Anxiety/bipolar disorder.   PLAN:  1. We will continue Neurontin at current dosing, but increased      valproic acid 1500 mg q.a.m.  Observe for oversedation/ side-      effects.  2. Add low dose Pamelor 10-20 mg nightly for sleep and neuropathic      pain.  3. Refill hydrocodone #60 today 10/650.  4. Consider long-acting opioids.  She may benefit from methadone      trial.  I also suggested analgesic cream      perhaps a trail of Savella and some other options to her as well      today.  5. I will see her back in a month.      [name], M.D.  Electronically Signed     ZTS/MedQ  D:  03/15/2008 13:02:40  T:  03/16/2008 05:16:10  Job #:  [id number]   cc:   [facility]  [address]  [place], [place] [address]

## 2010-08-16 NOTE — Op Note (Signed)
NAMEMARGET, OUTTEN               ACCOUNT NO.:  [id number]   MEDICAL RECORD NO.:  [id number]          PATIENT TYPE:  INP   LOCATION:  2899                         FACILITY:  [facility]   PHYSICIAN:  Donalee Citrin, M.D.        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   PROCEDURE:  Gill decompression of the L3-4 disc space, posterior lumbar  interbody fusion L3-4 using 10 x 26 mm tension allograft wedges, pedicle  screw fixation L3-4 using the Legacy 6.35 pedicle screw system, with six 5 x  45 screws at L3 and L4, posterolateral arthrodesis L3-4 using locally  harvested autograft, open reduction of spinal deformity L3-4, and placement  of a Hemovac drain.   SURGEON:  Donalee Citrin, M.D.   ASSISTANT:  Kathaleen Maser. Pool, M.D.   ANESTHESIA:  General endotracheal.   HISTORY OF PRESENT ILLNESS:  The patient is a very pleasant 65 year old  female who has longstanding back and bilateral leg pain radiating down to  the backs of her legs to her knees and occasionally into her anterior shin,  anterior tibialis region.  This has been refractory to all forms of  conservative treatment.  The patient did get some relief from an L3-4 facet  injection, but otherwise it has been unrelenting and progressively  worsening.  She had predominantly claudication with worsening back pain  greater than leg pain.  Due to the patient's failure of conservative  treatment, history of severe lumbar spinal stenosis from degenerative  spondylolisthesis at L3-4, the patient was recommended decompression and  stabilization procedure.  Discussed and the risks and benefits with her.  She understands and agrees to proceed forward.   FINDINGS:  Severe facet arthropathy and degenerated  facets, with collapse  and compression in both the L3 and L4 roots bilaterally, worse on the left.   OPERATIVE COURSE:  The patient is brought to the OR and was induced under  general anesthesia, and positioned prone on the Wilson frame.  Back was  prepped and draped in the usual sterile fashion.  After the preop x-ray  localized the L4-5 disc space, the incision was infiltrated with 10 cc of  lidocaine with epinephrine.  Bovie electrocautery was used to take this down  to the subcutaneous tissue.  Subperiosteal dissection was carried down to  the lamina of L2, L3, and L4 bilaterally, exposing the T-piece at L3 and L4.  The L3 spinous process was noted to be markedly hypermobile.  After  intraoperative x-ray localization of the L4 pedicle, a Leksell rongeur was  used to remove the L3 spinous process and the lamina of L3 was completed.  The medial facets were also removed and noted to be markedly degenerated and  collapse.  Severe compression in both L3 roots, and superior aspect of the  facet complex was visualized.  These were all underbitten with 2  and 3 mm  curets for adequate decompression of the L3 root out its foramen and hugging  the pedicle.  Then, the L4 roots were also decompressed, hugging the pedicle  out their foramen.  The lateral aspect of the __________ was underbitten,  exposing the lateral disc space.  The epidural veins were coagulated.  At  the end of the decompression, there was no further stenosis, and there had  been severe facet arthropathy causing hourglass compression of the thecal  sac prior.  Then, __________ were placed using a high-speed drill pot holes  were drilled at the junction between the transverse process of the lateral  facet complex using bony landmarks within the canal as well as  extracanalicular and fluoroscopy.  Pothole was drilled, and the pedicle was  cannulated, probed with the probe, tapped with a 5-5 tap, probed again, and  a 6 x 45 screw  was inserted at L3 on the right.  The procedure was repeated  at L45 on the right as well as L3-4 on the left.  After all four pedicle  screws were placed, all four screws had excellent purchase.  Fluoroscopy  confirmed good position and trajectory.  Then tested the interbody work.  A  nerve root retractor was used to reflect the left L4 nerve root medially.  Epidural veins coagulated.  Disc space was incised, and the disc was noted  to be markedly degenerated and collapsed.  Disc was removed, and a size 10  distractor was inserted.  This had good apposition of the endplates.  This  was left in place.  Then, the right-sided L4 nerve root was reflected  medially.  The annulotomies and disk spaces were adequately cleaned out  using a size 10 cutter and chisel.  This was prepared, and this also  facilitated some partial reduction of the deformity.  Then, fluoroscopy  confirmed the depth of the retractor used on the right, a 10 x 26 mm tension  allograft was inserted on the right side.  Then, the left-sided distractor  was removed.  The endplates were prepared again on the left in a similar  fashion.  The locally harvested autograft was packed against the right side  allograft, and the left-sided allograft was inserted.  After both grafts had  been inserted and confirmed, good position by fluoroscopy, attention was  taken to copious irrigation with bacitracin.  Then, lateral T-piece and  lateral facet complexes were aggressively decorticated.  The remainder of  the locally harvested graft was packed in the lateral gutters.  Then, a 40  mm rod was sized, selected, and inserted, tucked, tightened down at L4, and  the L3 pedicle screws compressed against L4, and a 4 x 22 crosslink was  inserted.  Gelfoam was overlaid on top of the dura.  Postoperative  fluoroscopy showed good position of rod, screws, and bone grafts.  Then, and  muscle and fascia were reapproximated with 0 interrupted Vicryl, and  the skin was closed with a running 4-0 subcuticular.  Benzoin and Steri-Strips  applied.  The patient went to the recovery room in stable condition.  The  sponge and needle counts were correct at the end of the case.      GC/MEDQ  D:  07/24/2004  T:  07/24/2004  Job:  [id number]

## 2010-08-16 NOTE — Assessment & Plan Note (Signed)
Mieko is back regarding her bilateral metatarsalgia. She has done  extremely well with the injections and has had no further problems since  we preformed them. She had a flare up of her trigeminal pain which has  seemed to improve. She found no benefit with the Limbrel so she stopped  this medication. She has had some pain with the left knee still  particularly with increased activity but overall the knee has improved  since she had replacement surgery. Patient reports her pain now is a 0  out of 10. She describes her sleep as good. The pain is present in the  left knee with bending and activities. She tries to wear appropriate  shoe wear for the most part. She is using 1 to 2 hydrocodone p.r.n.  daily. There are often no days where she uses none.   REVIEW OF SYSTEMS:  Positive for decreased smell and taste at times due  to the trigeminal symptoms. She has had some depression. She denies any  constitutional, genitourinary, gastrointestinal , cardio, respiratory  complaints today.   SOCIAL HISTORY:  Without change and generally she is happy about how she  is feeling.   PHYSICAL EXAMINATION:  Blood pressure 128/82, pulse 112, respiratory  rate is 16, she is sating at 95% in room air.  The patient is pleasant in no acute distress. She is alert and oriented  times 3. Affect is bright and appropriate.  GAIT: Stable. Coordination is good. Reflexes are 2 +  prick sensation is  normal. Strength is 5/5 in all joints. She had good range of motion at  the left knee with 5/5 strength.  HEART: Regular rate and rhythm.  LUNGS: Clear.  ABDOMEN: Soft, nontender. She had no palpable pain along the tarsal to  metatarsal bones today. She had excellent foot range of motion. No  swelling was seen.   ASSESSMENT:  1. Chronic bilateral foot pain with tarso-metatarsal arthralgia which      has responded to injections.  2. Bilateral total knee replacements.  3. Anxiety/bi polar disorder.   PLAN:  1.  Continue appropriate shoe wear and maintenance.  2. Hydrocodone p.r.n. for breakthrough pain.  3. Encouraged quadriceps, hamstring, and gastrocnemius strengthening      for the left knee to improve support of the knee replacement and      joints.  4. I will see the patient back in about 6 months time. She will call      me if she is having any further problems.      [name], M.D.  Electronically Signed     ZTS/MedQ  D:  03/18/2006 09:48:50  T:  03/18/2006 11:59:51  Job #:  [id number]   cc:   Royetta Crochet, MD  Fax: [phone]

## 2010-08-16 NOTE — Discharge Summary (Signed)
[name], [name]               ACCOUNT NO.:  [id number]   MEDICAL RECORD NO.:  [id number]          PATIENT TYPE:  INP   LOCATION:  3008                         FACILITY:  [facility]   PHYSICIAN:  Donalee Citrin, M.D.        DATE OF BIRTH:  06/29/1945   DATE OF ADMISSION:  07/24/2004  DATE OF DISCHARGE:  07/27/2004                                 DISCHARGE SUMMARY   ADMISSION DIAGNOSIS:  Degenerative disk disease, status post lumbar fusion.   FINAL DIAGNOSIS:  Degenerative disk disease, status post lumbar fusion.   HISTORY OF PRESENT ILLNESS:  The patient is a very pleasant 65 year old  female who was admitted with the aforementioned diagnosis, underwent the  aforementioned procedure.  Postoperative the patient did well, recovering on  the floor.  The patient was afebrile with stable vital signs, neurologically  stable with no leg pain.  The patient was progressively mobilized with  physical and occupational therapy and continued to do very well.  At the  time of discharge on hospital day 3, the pain was well-controlled on pills,  was ambulating and voiding spontaneously, and was discharged home with  follow-up in two weeks.       GC/MEDQ  D:  10/25/2004  T:  10/25/2004  Job:  [id number]

## 2010-08-16 NOTE — Op Note (Signed)
[name], [name]               ACCOUNT NO.:  [id number]   MEDICAL RECORD NO.:  [id number]          PATIENT TYPE:  INP   LOCATION:  2011                         FACILITY:  [facility]   PHYSICIAN:  Petra Kuba, M.D.    DATE OF BIRTH:  [birth year]   DATE OF PROCEDURE:  01/15/2006  DATE OF DISCHARGE:                                 OPERATIVE REPORT   PROCEDURE:  EGD with biopsy.   INDICATIONS:  Anemia, history of gastric ulcer on lots of aspirin and  nonsteroidals.  Consent was signed after risks, benefits, methods, options  thoroughly discussed multiple times in the past.   MEDICINES USED:  Fentanyl 60 mcg, Versed 8 mg.   PROCEDURE:  Video endoscope was inserted by direct vision.  She did have a  tiny hiatal hernia.  Scope passed in the stomach and advanced to the antrum  where a small shallow partially healed antral ulcer was seen.  The pylorus  was normal.  Scope passed into the duodenal bulb which was normal, around  the C-loop to a normal second and probably third part of the duodenum.  I  went ahead and took a few biopsies of the duodenum and put in the first  container.  Scope was withdrawn back to the bulb and a good look there ruled  out abnormalities in that location.  Scope was withdrawn back to the stomach  and retroflexed.  Angularis, cardia, fundus, lesser and greater curve were  all normal on retroflexed visualization except for the hiatal hernia being  confirmed in the cardia.  Straight visualization of the stomach did not  reveal any additional findings.  We went ahead and took two biopsies of the  antrum, a few biopsies of the ulcer and a few of the proximal stomach to  rule out any Helicobacter or other abnormalities.  Air was suctioned, scope  was slowly withdrawn, again a good look at the esophagus was normal.  The  scope was removed.  The patient tolerated the procedure well.  There was no  obvious immediate complication.   ENDOSCOPIC DIAGNOSES:  1. Tiny hiatal  hernia.  2. Healing small antral ulcer status post biopsy.  3. Otherwise normal EGD status post stomach and duodenal biopsies.   PLAN:  Await pathology.  Okay from my standpoint to go home with close  outpatient follow-up with Dr. Bosie Clos to see if capsule endoscopy or repeat  colonoscopy is needed.  Also would consider sending him back to Dr. Dalene Carrow  for consideration of Procrit, IV iron, etc.   I discussed all the above with the husband who is hard of hearing so hard to  know how much he understood           ______________________________  Petra Kuba, M.D.    MEM/MEDQ  D:  01/15/2006  T:  01/16/2006  Job:  [id number]   cc:   Vicente Serene I. Odogwu, M.D.  Shirley Friar, MD

## 2010-08-16 NOTE — Consult Note (Signed)
NAMECHARNISE, LOVAN               ACCOUNT NO.:  [id number]   MEDICAL RECORD NO.:  [id number]          PATIENT TYPE:  INP   LOCATION:  2011                         FACILITY:  [facility]   PHYSICIAN:  Stephani Police, PA    DATE OF BIRTH:  1945/07/25   DATE OF CONSULTATION:  01/14/2006  DATE OF DISCHARGE:                                   CONSULTATION   REQUESTING PHYSICIAN:  Deboraha Sprang hospitalist, January 14, 2006.   CHIEF COMPLAINT:  Severe anemia.   HISTORY OF PRESENT ILLNESS:  Ms. Olvera is a 65 year old female with a  history of chronic anemia.  She has complaints of two weeks of dizziness and  weakness.  She has complaints of pale skin and nails.  She reports taking a  great deal of NSAIDs lately including Indomethacin.  She reports recent  falling and multiple bruises without known source of injury.  She was  admitted on October the 16th, from Dr. Amil Amen' office due to a low  hemoglobin 8.2, and symptomatic orthostasis.  She has a history of a gastric  ulcer and erosive esophagitis on EGD in December of 2006, done by Dr.  Bosie Clos.  I did not see any pathology results from that EGD.   PAST MEDICAL HISTORY:  Significant for:  1. Degenerative joint disease.  2. Coronary artery disease with stents placed in 2006.  3. Hyperlipidemia.  4. Tobacco use.   PAST SURGICAL HISTORY:  Are listed in detail on Dr. Lubertha Basque history and  physical dated October 16.   CURRENT MEDICATIONS:  Include:  1. Plavix 75 mg.  2. Nexium 40 mg b.i.d.  3. Indomethacin, she was recently switched from Relafen to indomethacin      for her chronic joint pain.  4. Robaxin 75 mg q.8 h.  5. Iron.  6. Nitroglycerin.  7. Aspirin 81 mg per day.  8. Lopressor that was recently discontinued.  9. Lipitor 40 mg.  10.Wellbutrin 150 mg q.h.s.  11.Clonazepam.  12.Elavil.  13.Lamictal.  14.Cymbalta.  15.Fergon 65 mg two tablets in the morning, and two tablets in the      evening.  16.Fish oil 1200 mg two per day.  17.Multivitamin Caltrate 600 with vitamin D once per day.   ALLERGIES:  She has no known drug allergies.   REVIEW OF SYSTEMS:  RESPIRATORY:  Is significant for a dry cough x3 to 4  days.  GASTROINTESTINAL:  Minimal nausea.  She reports sharp, intermittent  pain in her left lower quadrant.  She also reports that she has very  regular, daily bowel movements that are black and easily passed.  She has no  hematochezia, no hematemesis.  No indigestion, no heartburn, no dysphagia.   FAMILY HISTORY:  Significant for no colon, liver or pancreatic cancer.   SOCIAL HISTORY:  She lives with her husband.  She reports that she smokes  approximately five cigarettes per day.  Negative for alcohol.  Negative for  other drugs.   CURRENT LABORATORY DATA:  Show a CBC with a hemoglobin of 9.4, that is after  2 units of packed red blood cells;  hematocrit 28.5, white count 8.9,  platelets 426.  Her PT is 12.9, her INR is 1, her PTT is 26.  Chem-7 was  within normal limits.  Her calcium is 9.3.  LFTs on October 16:  AST 15, ALT  17, alkaline phosphatase 120, total bilirubin 0.4.  Her UA was notable for  WBCs that were too numerous to count, a few bacteria, trace blood,  leukocytes large.  She is FOBT strongly positive, but she was taking iron  until yesterday morning.   PHYSICAL EXAMINATION:  GENERAL:  She is alert and oriented in no apparent  distress.  SKIN:  Her skin is pale.  She has multiple bruises on her face, upper  extremities, lower extremities and stomach.  CARDIOVASCULAR SYSTEM:  Regular rate and rhythm.  LUNGS:  Have decreased breath sounds.  ABDOMEN:  Soft, nontender, nondistended with positive bowel sounds.  No  masses or bruits.  EXTREMITIES:  Show no edema.   IMPRESSION:  Dr. Vida Rigger has seen and examined the patient and collected  a history.  He believes this is a 65 year old with a history of chronic  anemia, that could be caused from several different etiologies, one being an   upper gastrointestinal bleed from NSAID use and he will perform an upper  endoscopy tomorrow, October 18 at noon, to search for the source of  bleeding.      Stephani Police, [place]     MLY/MEDQ  D:  01/14/2006  T:  01/15/2006  Job:  [id number]   cc:   [facility]  Shirley Friar, MD  Petra Kuba, M.D.  Royetta Crochet, MD

## 2010-08-16 NOTE — Assessment & Plan Note (Signed)
[name] is back regarding her foot pain.  She complains of more pain  around the right first toe over the last few weeks time.  The pain seems  to be worse when she walks on the foot.  She is concerned that she may  have a gouty flare.  She had some swelling around the first toe, does  not recall any warmth or color changes.  The area was hypersensitive.  She still feels a bit better than what it has been today.  Her pain  right now is a 3 to 4 out of 10 and it varies with a 4 to 7 out of 10 of  late.  She describes pain as sharp, as aching and stabbing in nature.  The pain interferes with her general activity, relations with others and  enjoyment of life on a moderate level.  She does think that the pain is  a bit different that what we originally treated, higher in the foot.  She is using the Lamictal 100 mg q. Day for trigeminal neuralgia  control.  She uses hydrocodone 7.5 for breakthrough pain both for the  foot and face.   REVIEW OF SYSTEMS:  Positives for trouble walking.  Depression.  Loss of  taste or smell. Weight gain.  The pertinent positives are above and full  review in the health and history section.   SOCIAL HISTORY:  Is without change.   PHYSICAL EXAMINATION:  Blood pressure is 130/72, pulse is 108,  respiratory rate 18, she is satting 96% on room air.  The patient is pleasant, in no acute distress.  She is alert and oriented x3.  Affect is bright and generally  appropriate.  The patient walks with antalgia to the right side today.  We palpated  around the 1st MTP and the 1st metatarsal and she had a little pain  there, except higher up at the metatarsal, tarsal junction.  This is not  the pain that she complained of.  We palpated laterally on the 2nd MTP  and she had more exquisite pain consistent with what she has been  feeling.  The area was worse with weight bearing.  I saw no warmth or  color changes.  Pulses are 2+.  HEART:  Was tachycardic.  CHEST:  Was  clear.  ABDOMEN:  Soft, nontender.  NEUROLOGICALLY:  She was stable.   ASSESSMENT:  1. Chronic bilateral foot pain with tarsal, metatarsal arthralgia.  2. New right metatarsal phalangeal junction pain at the 2nd toe.  3. History of anxiety/bipolar disorder.  4. History of trigeminal neuralgia.  5. Questionable gout.   PLAN:  1. After informed consent we injected at the 2nd MTP with 40 mg of      Kenalog and 2 cc of 1% lidocaine.  Patient tolerated it well.  She      was experiencing relief before she left the office today.  2. We will change Relafen back to Celebrex per patient request at 200      mg q. day for generalized osteoarthritis control.  3. Refill hydrocodone 7.5, #90.  4. Give patient #20 colchicine 0.6 mg for questionable gouty flares as      she may have been experiencing this initially.  I did not see signs      of it acutely today.  5. Encourage appropriate shoe wear with good arch support and insole.  6. I will see the patient back at her scheduled appointment in June.  [name], M.D.  Electronically Signed     ZTS/MedQ  D:  05/08/2006 14:55:39  T:  05/08/2006 16:56:51  Job #:  [id number]   cc:   Royetta Crochet, MD  Fax: [phone]

## 2010-08-16 NOTE — H&P (Signed)
NAMESURAH, Ware               ACCOUNT NO.:  [id number]   MEDICAL RECORD NO.:  [id number]          PATIENT TYPE:  INP   LOCATION:  1829                         FACILITY:  [facility]   PHYSICIAN:  Sherin Quarry, MD      DATE OF BIRTH:  06-30-1945   DATE OF ADMISSION:  02/13/2005  DATE OF DISCHARGE:                                HISTORY & PHYSICAL   HISTORY OF PRESENT ILLNESS:  [name] is 65 year old lady who has been  evaluated since her discharge from the hospital, described below, for  chronic anemia.  She has seen Dr. Dalene Carrow, who did a bone marrow examination  on her several days ago.  She tells me that the pathology on this specimen  did not show any malignancy, and that Dr. Dalene Carrow suspects that her problem  is iron deficiency.  She has been taking supplemental iron.  I gather that  her hemoglobin has been approximately 9.  Over the last several days, she  has experienced increasing shortness of breath.  She contacted Dr. Dalene Carrow,  who advised her to come to the emergency room, and to be admitted to the  hospital if her hemoglobin was less than 9.  It was 8.8 with normochromic,  normocytic indices.  Platelet count was 663,000.  Therefore, the plan is to  admit her for the purpose of performing a blood transfusion.  She presented  in August of this year with a 53-month history of exertional chest pain, and  had an abnormal Cardiolite study.  She, therefore, underwent cardiac  catheterization, and was found to have a lesion.  She underwent an IVUS  procedure on this vessel, as well as PCI and Taxus stent implantation, which  she apparently tolerated well.  She has seen Dr. Amil Amen for concern about  her symptomatic dyspnea.  In regard to her dyspnea, she states that she  becomes short of breath, particularly in the morning.  Unable to quantitate  her exercise tolerance.  She is not experiencing any exertional chest pain.  There has been no orthopnea or PND.   PAST MEDICAL  HISTORY:   MEDICATIONS:  She is on a number of medications.  These include -  1.  Wellbutrin XL 200 mg daily.  2.  Zoloft 10 mg daily.  3.  Cymbalta 60 mg daily.  4.  Klonopin 1 mg daily.  5.  __________ daily.  6.  Tramadol 50 mg t.i.d. p.r.n.  7.  Amitriptyline 50 mg at bedtime.  8.  Multiple vitamins.  9.  Aspirin 325 mg daily.  10. Lopressor 25 mg b.i.d.  11. She was on Isordil 60 mg daily, but this has apparently been stopped.   ALLERGIES:  She has no known drug allergies.   PAST SURGICAL HISTORY:  She states that she has had numerous minor  surgeries, but in terms of major surgeries, the main ones have been -  1.  Appendectomy.  2.  Total knee replacement.  3.  Lumbar fusion.  4.  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   FAMILY HISTORY:  Noncontributory.   SOCIAL  HISTORY:  The patient smokes without wheezing or chest congestion.  CARDIOVASCULAR:  Denies orthopnea or PND.  GI:  Denies hematemesis, melena,  abdominal pain, vomiting, or hematochezia.  GU:  Denies dysuria or urinary  frequency.  NEUROLOGIC:  There is no history of seizure or stroke.  ENDOCRINE:  Denies excessive thirst, urinary frequency, or nocturia.   PHYSICAL EXAMINATION:  VITAL SIGNS:  Her temperature is 97, blood pressure  111/70, pulse 90, respirations 18.  O2 saturation is 97%.  GENERAL:  The patient is a somewhat pale-appearing woman who does not appear  to be in any distress.  HEENT:  Within normal limits.  CHEST:  Clear.  BACK:  No CVA or point tenderness.  CARDIOVASCULAR:  A __________/6 systolic murmur.  ABDOMEN:  Benign.  There are normal bowel sounds, without masses or  tenderness.  NEUROLOGIC:  Within normal limits.  EXTREMITIES:  No evidence of cyanosis or edema.   IMPRESSION:  1.  Symptomatic anemia.  The patient had a bone marrow examination by Dr.      Dalene Carrow.  Dr. Dalene Carrow has advised that she needs to be transfused, in      light of her reduced hemoglobin and associated  dyspnea.  2.  Coronary artery disease, status post stent in August 2006.  3.  Dyslipidemia.  4.  Degenerative joint disease.  History of total knee replacement.  5.  History of lumbar fusion.  6.  History of excision of benign bladder polyp.  7.  Will proceed as planned to transfuse 2 units of blood.  She will      continue her usual medicines.  It would be a good idea to discuss any      other plans with Dr. Dalene Carrow.           ______________________________  Sherin Quarry, MD     SY/MEDQ  D:  02/13/2005  T:  02/14/2005  Job:  [id number]   cc:   [facility]   Royetta Crochet, MD  Fax: [phone]   Francisca December, M.D.  Fax: [phone]   Lauretta I. Odogwu, M.D.  Fax: [phone]

## 2010-08-16 NOTE — Op Note (Signed)
[name], [name]               ACCOUNT NO.:  [id number]   MEDICAL RECORD NO.:  [id number]          PATIENT TYPE:  AMB   LOCATION:  ENDO                         FACILITY:  [facility]   PHYSICIAN:  Shirley Friar, MDDATE OF BIRTH:  22-Dec-1945   DATE OF PROCEDURE:  03/26/2005  DATE OF DISCHARGE:                                 OPERATIVE REPORT   INDICATIONS:  Iron-deficiency anemia.   MEDICATIONS:  Fentanyl 125 mcg, Versed 12.5 mg IV.   FINDINGS:  Rectal exam was normal.  A pediatric adjustable colonoscope was  inserted and advanced through an adequately prepped colon where the cecum,  ileocecal valve, and appendiceal orifice were identified.  The terminal  ileum was intubated and was normal in appearance.  On careful withdrawal,  the colonoscope revealed no mucosal abnormalities, polyps, or lesions.  Retroflexion was done and was normal in appearance.  The colonoscope was  withdrawn and confirmed the above findings.   ASSESSMENT:  1.  Normal colonoscopy.  2.  Normal terminal ileum.   PLAN:  Proceed with EGD.      Shirley Friar, MD  Electronically Signed     VCS/MEDQ  D:  03/26/2005  T:  03/26/2005  Job:  [id number]   cc:   Royetta Crochet, MD  Fax: [phone]   Lauretta I. Odogwu, M.D.  Fax: [phone]

## 2010-08-16 NOTE — Op Note (Signed)
[name], [name]               ACCOUNT NO.:  [id number]   MEDICAL RECORD NO.:  [id number]          PATIENT TYPE:  INP   LOCATION:  5032                         FACILITY:  [facility]   PHYSICIAN:  Lubertha Basque. Dalldorf, M.D.DATE OF BIRTH:  1946-01-12   DATE OF PROCEDURE:  11/18/2005  DATE OF DISCHARGE:  11/14/2005                                 OPERATIVE REPORT   PREOPERATIVE DIAGNOSIS:  Left knee degenerative arthritis.   POSTOPERATIVE DIAGNOSIS:  Left knee degenerative arthritis.   PROCEDURE:  Left total knee replacement.   ANESTHESIA:  General and block.   ATTENDING SURGEON:  Lubertha Basque. Jerl Santos, M.D.   ASSISTANTHarolyn Rutherford, PA   INDICATIONS FOR PROCEDURE:  The patient is a 65 year old woman with long  history of bilateral knee pain.  She has bone-on-bone degeneration in the  medial and patellofemoral compartments of the left knee.  She has failed  oral anti-inflammatories and multiple injectables.  She has pain which  limits her ability to rest and to walk.  She is status post a successful  knee replacement on the opposite side 2 years ago and at this point elects  to have the same procedure on the left.  Informed operative consent was  obtained discussion of possible complications of reaction to anesthesia,  infection, DVT, PE, and death.   SUMMARY OF FINDINGS AND PROCEDURE:  Under general anesthesia and a block,  through a longitudinal anterior incision, a left knee replacement was  performed.  She had severe degeneration medial and patellofemoral and fair  bone quality.  We addressed her problem with a cemented DePuy LCS system  using a standard femur, 3 tibia, 10 mm deep dish spacer, and 35 mm all  polyethylene patella.  I did include Zinacef antibiotic in the cement.  Bryna Colander assisted throughout and was invaluable to the completion of the  case in that he retracted while I used appropriate jigs and instruments and  placed the components.  The patient's knee flexed to  120 degrees against  gravity end of the case.   DESCRIPTION OF PROCEDURE:  The patient was taken to the operating suite  where general anesthetic was applied without difficulty.  She was also given  a block in the preanesthesia area.  She is positioned supine and prepped and  draped in normal sterile fashion.  After the administration of IV Kefzol,  the left leg was elevated, exsanguinated, and a tourniquet inflated about  the thigh.  A longitudinal anterior incision was made with dissection down  to the extensor mechanism.  All appropriate anti-infective measures were  used including Betadine impregnated drape, preoperative IV antibiotic, and  closed hooded exhaust systems for each member of the surgical team.  A  medial parapatellar incision was made through the extensor mechanism.  The  kneecap was flipped and the knee flexed.  Some residual meniscal tissues  were removed along with the ACL and PCL.  An extramedullary guide was placed  in order to make a cut on the tibia with slight posterior tilt.  An  intramedullary guide was then placed on the femur  to make anterior and  posterior cuts creating a flexion gap of 10 mm.  A second intramedullary  guide was placed in the femur to make a distal cut creating an equal  extension gap of 10 mm balancing the knee.  The femur sized to a standard  while the tibia sized to a three and appropriate guides placed and utilized.  The patella was cut down in thickness by about 8 mm to 15 and sized to a 35  with appropriate guide placed and utilized.  Trial reduction was done with  all these components.  She easily came to slight hyperextension and flexed  well.  The patella tracked well and no lateral release was required.  Cement  was mixed including antibiotic and was pressurized after thorough pulsatile  lavage cleansing of all three cut bony surfaces.  Excess cement was trimmed  and pressure was held on components until cement hardened.  The  tourniquet  was deflated and a small amount of bleeding was easily controlled with Bovie  cautery.  The knee was again irrigated followed by placement of drain  exiting superolaterally.  The extensor mechanism was reapproximated with #1  Vicryl interrupted fashion followed by subcutaneous reapproximation with 0  and 2-0 undyed Vicryl and skin closure with staples.  The knee easily flexed  to 120 degrees at this point against gravity.  Adaptic was applied followed  by dry gauze and loose Ace wrap.  Estimated blood loss was about 50 mL.  Intraoperative fluids can be obtained from anesthesia records.   DISPOSITION:  The patient was extubated in the operating room and taken to  recovery in stable addition.  She was be admitted to the orthopedic surgery  service for appropriate postop care to include perioperative antibiotics and  Coumadin plus Lovenox for DVT prophylaxis.      Lubertha Basque Jerl Santos, M.D.  Electronically Signed     PGD/MEDQ  D:  11/18/2005  T:  11/18/2005  Job:  [id number]

## 2010-08-16 NOTE — Discharge Summary (Signed)
[name], [name]                          ACCOUNT NO.:  [id number]   MEDICAL RECORD NO.:  [id number]                   PATIENT TYPE:  INP   LOCATION:  5034                                 FACILITY:  [facility]   PHYSICIAN:  Lubertha Basque. Jerl Santos, M.D.             DATE OF BIRTH:  06/09/1945   DATE OF ADMISSION:  08/01/2003  DATE OF DISCHARGE:  08/06/2003                                 DISCHARGE SUMMARY   ADMISSION DIAGNOSES:  1. Right knee end-stage renal disease.  2. History of depression.  3. History of bladder cancer.   DISCHARGE DIAGNOSES:  1. Right knee end-stage renal disease.  2. History of depression.  3. History of bladder cancer.   OPERATION:  Right total knee replacement.   BRIEF HISTORY:  This is a 65 year old white female who is a patient well  known to our practice. She has been having right knee discomfort, pain with  ambulation, and now pain at night time. She is now having pain with every  step. She has had multiple corticosteroid injections intra-articular in the  right knee, and they are not helping any longer as well as multiple  nonsteroidal anti-inflammatory drugs which are also not helping. She on x-  ray has end-stage, bone-on-bone, degenerative joint disease.   PERTINENT LABORATORY AND X-RAY FINDINGS:  Her last INR was 1.7. Her last CBC  revealed hemoglobin of 8.3, hematocrit of 24.6, WBCs 13.7. Routine  chemistries:  Sodium 139, potassium 4.1, glucose 88, BUN 14, creatinine 0.8,  calcium 10. Chest x-ray, two views, no active disease, some scarring seen in  the left lung base.   COURSE IN THE HOSPITAL:  The patient was admitted postoperatively, and her  diet was to be advanced slowly as tolerated. She was on a morphine PCA pump,  three doses of IV Ancef given 1 q.8h, Coumadin and Lovenox per standard DVT  prophylaxis protocol per pharmacy. Percocet by mouth was also ordered and  Skelaxin as a muscle relaxer. She was kept on her usual medications:   Valium  10 mg one p.o. b.i.d. for anxiety, Zoloft 100 mg one p.o. q.d., Cymbalta 60  mg one a day, Wellbutrin 300 mg one a day, Elavil 50 mg at night. CPM  machine was ordered starting at 0 to 60 degrees 6 to 8 hours a day and  advance as tolerated. In and out catheter which she had no difficulty in  urination during the hospital stay. Knee immobilizer as needed. Throughout  her hospital stay, she did well. Her temperature on the first day  postoperative was 98. Her blood pressure 125/54, heart rate of 102.  Hemoglobin at that time was 9.1. Her wound was benign. No sign of irritation  or infection. Soft calf. Abdomen with positive bowel sounds. Lungs clear.  She was of normal affect having no increased difficulty with depression. The  second day postoperative, her drain had been pulled out accidentally  the  evening before, but her dressing was changed and her wound was benign  without sign of irritation or infection. No drainage. Calf was nontender.  Abdomen soft. Lungs were clear. Vicodin was stopped at that time and put on  the Percocet which she tolerated better. Physical therapy worked with her,  and occupational consultation was also done for weight bearing as tolerated.  [facility] agency had been contacted for home therapy and home  protimes. She was discharged home the following day.   CONDITION ON DISCHARGE:  Improved.   DISCHARGE INSTRUCTIONS:  She will remain on her home medications which are  as follows:  1. Valium 10 mg one two times a day as needed for anxiety.  2. Elavil 50 mg at night time.  3. Wellbutrin 300 mg a day.  4. Zoloft 100 mg a day.  5. Cymbalta 60 mg a day.   The addition of Coumadin as regulated by pharmacy, and the first  recommendation was 5-mg tablets, take one and a half on May 8 and on May 9,  and then [facility] would check her protime and adjust accordingly.  She was also given a prescription for Percocet which should be taken one  or  two every four to six hours for pain and a recommendation of Nu-Iron which  is an over-the-counter iron preparation to be taken one per day.   Once again, [facility] had been contacted for physical therapy and  protimes. She could be weight bearing as tolerated with walker or crutches.  Diet was unrestricted. May shower and may change the dressing in her leg  every other day. She is to call if there are signs of infection, increasing  temperature, redness, any drainage, increasing pain, and we will see her  back in the office in one week. She was to call [phone] with either signs  of infection or to make her office appointment. She was given two  prescriptions, one for the Coumadin as mentioned above, 5 mg #60 as  directed, and then Percocet 5/325 #60 one or two q.4-6h. pain.      Lindwood Qua, P.A.                    Lubertha Basque Jerl Santos, M.D.    MC/MEDQ  D:  08/06/2003  T:  08/07/2003  Job:  [id number]

## 2010-08-16 NOTE — Discharge Summary (Signed)
NAMECAYLA, [name]               ACCOUNT NO.:  [id number]   MEDICAL RECORD NO.:  [id number]          PATIENT TYPE:  INP   LOCATION:  5032                         FACILITY:  [facility]   PHYSICIAN:  Lubertha Basque. Dalldorf, M.D.DATE OF BIRTH:  Mar 26, 1946   DATE OF ADMISSION:  11/18/2005  DATE OF DISCHARGE:  11/23/2005                                 DISCHARGE SUMMARY   DATE OF ADMISSION:  November 18, 2005   DATE OF DISCHARGE:  November 23, 2005   ADMISSION DIAGNOSES:  1. Left knee severe osteoarthritis.  2. Depression.  3. History of anemia.  4. History of coronary artery disease.  5. Esophageal reflux.   DISCHARGE DIAGNOSES:  1. Left knee severe osteoarthritis.  2. Depression.  3. History of anemia.  4. History of coronary artery disease.  5. Esophageal reflux.   OPERATIONS:  Left total knee replacement.   BRIEF HISTORY:  [name] is a 65 year old patient well known to our  practice.  In the past, we had replaced her right knee and she had done  well.  She does have chronic anemia and is treated by Dr. Stacie Acres for that and  we have had some difficulty in keeping her blood count up so that we could  do surgery, and he has recently transfused her and given [place] the okay to  proceed with a left total knee replacement.  She is having pain in her left  knee when she walks, trouble sleeping at nighttime and x-rays reveal end-  stage DJD.  We have discussed treatment options with her, that being total  knee replacement, with the risk of anesthesia, infection, DVT, and possible  death.   PERTINENT LABORATORY AND X-RAY FINDINGS:  Chest x-ray showed some mild  atelectasis, bronchitic changes, and borderline heart size.  WBCs 11.9, RBCs  3.4, hemoglobin 9.4, hematocrit 28.6, platelets 264.  Regular INRs were  done, as she was on low dose Coumadin protocol regulated by pharmacy.  Sodium 139, potassium 3.6, glucose 110, BUN 11, creatinine 0.8, calcium 8.7.   Postoperatively she was on a PCA  morphine pump full dose protocol, IV of  lactated Ringers, advanced diet as tolerated.  Coumadin and Lovenox protocol  per pharmacy for DVT prophylaxis.  IV Ancef 1 gram q.8 hours x3 doses.  Additional oral medications included ferrous sulfate, stool softeners,  Percocet for pain.  She had a Foley catheter initially for the first 1-2  days and then was discontinued.  Use of knee high TEDs and incentive  spirometry were also done to prevent pulmonary embolus or DVT.  The use of a  CPM machine was also incorporated and advanced as tolerated.  Followup lab  studies were described above.  Additional information on her hospital stay  the first day postop:  Her blood pressure was 113/67, temperature was 98,  hemoglobin was 9.8, INR 1.0.  Her abdomen had positive bowel sounds and her  lungs were clear with positive breath sounds.  Knee showed no sign of  infection.  Catheter was in place.  Drains were working, a Hemovac.  Physical therapy was ordered to  be weightbearing as tolerated.  The second  postop, her knee dressing was changed, her drain was pulled, and her wound  was benign.  It was redressed with sterile dressing.  Blood pressure 130/77,  hemoglobin 9.7.  CBG at 107, INR 1.2.  During her hospital stay, on some of  the medications that she was on, she reportedly was drowsy and even had some  hallucinations.  We backed off of her medicines, which did seem to help.  The home care agency was arranged, [facility], for home care needs,  which would include prothrombin times and physical therapy, and she was  discharged home.   CONDITION ON DISCHARGE:  Improved.   Her diet will be unrestricted other than modifications related to Coumadin  administration.  She may change her dressing daily.  Percocet 1 or 2 for  pain as needed every 4-6 hours.  Coumadin 5 mg dose daily per pharmacy with  home health, i.e. [facility], to draw prothrombin time Monday November 24, 2005, and then  regular Coumadin from there.  They will also provide  physical therapy.  She will follow up with her PCP doctor in 1-2 weeks,  followup with Dr. Jerl Santos in 2 weeks at [phone].  Any sign of infection,  she is to call the office immediately.   Her medicines were reconciled and her discharge medicines will be as  follows:  1. Plavix will be held.  2. ASA 81 mg a day.  3. Lipitor 40 mg 1 a day.  4. Wellbutrin 300 mg a day.  5. Prozac 40 mg a day.  6. Elavil 50 mg at night.  7. Calcium.  8. Glucosamine.  9. Klonopin 1 mg p.o. b.i.d.  10.Lopressor 25 mg b.i.d.      Lindwood Qua, P.A.      Lubertha Basque Jerl Santos, M.D.  Electronically Signed    MC/MEDQ  D:  12/09/2005  T:  12/09/2005  Job:  [id number]

## 2010-08-16 NOTE — H&P (Signed)
[name], [name]               ACCOUNT NO.:  [id number]   MEDICAL RECORD NO.:  [id number]          PATIENT TYPE:  INP   LOCATION:  2011                         FACILITY:  [facility]   PHYSICIAN:  Melissa L. Ladona Ridgel, MD  DATE OF BIRTH:  December 01, 1945   DATE OF ADMISSION:  01/13/2006  DATE OF DISCHARGE:                                HISTORY & PHYSICAL   CHIEF COMPLAINT:  Bruising, weakness, and dizziness.   PRIMARY CARE PHYSICIAN:  Royetta Crochet, M.D.   CARDIOLOGIST:  Francisca December, M.D.   HISTORY OF PRESENT ILLNESS:  The patient is a 65 year old white female with  past medical history for chronic anemia who was seen in the past by Dr.  Dalene Carrow in the [facility] and worked up for her condition.  The patient  has CAD with stents, depression, hypercholesterolemia, chronic back pain.  The patient presented to Dr. Corliss Marcus office today with a complaint of  worsening weakness, dizziness and falls with increased bruising over the  last two weeks.  The patient evidently had been started on indomethacin  approximately 2 weeks ago and thereafter, noticed increased bruising,  subsequently developed the sensation of dizziness and had at least one fall.  The patient was directly admitted from Dr. Amil Amen' office because of a low  hemoglobin and symptomatic orthostasis possibly secondary to chronic blood  loss.  She was admitted for rehydration and transfusion.   PAST MEDICAL HISTORY:  1. Degenerative joint disease.  2. Cardiovascular disease with stents in 2006, to the proximal LAD.  Her      last ejection fraction was 60-65%.  3. She has hyperlipidemia.  4. Tobacco abuse.  She still smokes approximately 5 cigarettes.  She does      not drink.   FAMILY HISTORY:  Reviewed and is noncontributory.   REVIEW OF SYSTEMS:  Negative epistaxis.  Negative gum bleeding.  Negative  nausea, vomiting, diarrhea.  Positive black stools from her iron.  Positive  weight loss but a good appetite.   She denies any chest pain, shortness of  breath, all other review of systems appear to be negative.   PAST SURGICAL HISTORY:  According to the patient's list, she had a:  1. Tonsillectomy.  2. Appendectomy.  3. D&C with miscarriage, requiring blood transfusion.  4. Tubal ligation.  5. Cervical dysplasia with cryotherapy.  6. Vulvar condyloma with laser ablation.  7. She had an open breast biopsy with removal of a benign nodule.  8. She had 2 surgeries for Morton neuromas.  9. She had a suspension plasty.  10.Basal joint replacement with rhizotomy for trigeminal neuralgia and a      tendon graft.  11.She had lumbar epidural injection.  12.She has had colonoscopy.  13.She has had chronic plantar fascitis with heel spurs requiring      injections.  14.She had an evaluation for a bladder tumor using transurethral      resection.  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  16.Lumbar fusion.  17.She had a bone marrow biopsy, in November 2006, by Dr. Dalene Carrow which  showed no obvious cancer.   ALLERGIES:  NO KNOWN DRUG ALLERGIES.   MEDICATION LIST:  1. Plavix 75 mg.  2. Nitroglycerin as needed.  3. She had been on Lopressor but that was discontinued.  4. She takes aspirin 81 mg.  5. Lipitor 40 mg.  6. Nexium 40 mg twice daily.  7. She had been taking Relafen but that was discontinued and her      indomethacin was started that has since been discontinued.  8. She takes Wellbutrin 150 mg q.h.s.  This has recently been increased to      300 mg.  9. She takes clonazepam 1 mg p.o. b.i.d.  10.She takes Elavil 25 mg.  11.Lamictal 200 mg.  12.Cymbalta 60 mg.  13.She also uses Robaxin 750 mg every eight hours as-needed.  14.She takes Fergon 65 mg two tablets in the morning and two tablets in      the evening.  15.She uses fish oil 1200 mg two per day.  16.Multivitamin.  17.Caltrate 600 with D once per day.   LABORATORY VALUES:  Reveal a white count of 11.2, hemoglobin of  8.2,  hematocrit 25.2, platelets of 561.  Her sodium was 138, potassium 4,  chloride 107, CO2 is 24, BUN is 22, creatinine is 0.8, glucose is 107.  Alkaline phosphatase is 120.  Her LFTs are within normal limits.  Her PT INR  are 12.9 and 1, respectively.   We will obtain an EKG to chart.   ASSESSMENT:  This is a 65 year old white female with a history chronic  gastrointestinal losses of blood who presented with worsening falls and  dizziness for the past two weeks after starting Indocin.  The patient was  found to be orthostatic and anemic in her cardiologist's office and  therefore, was directly admitted to the telemetry floor for further  evaluation and transfusion.   PLAN:  1. Cardiovascular.  Single-vessel disease with stents.  We will check with      Dr. Amil Amen on resuming her Plavix and aspirin.  For now, we will      continue her iron and I will resuscitate her with 2 units of packed red      blood cells with a small dose of Lasix in between.  2. Pulmonary.  She has no complaints of issues.  If she spikes a      temperature, I would go ahead and do a chest x-ray but at this time I      am not inclined as her exam is negative.  3. Gastrointestinal.  She has a history of peptic ulcer disease and      esophageal ulcers.  We will continue her Nexium and heme check her      stools.  The patient may need further evaluation with gastroenterology      if she continues to quickly show signs of anemia after transfusion.  4. Genitourinary.  She has leukocytosis.  We will check a UA and C&S to      rule out possible urinary tract infection.  5. Endocrine.  We will check a TSH.  6. Deep vein thrombosis prophylaxis with SCDs.  7. Fall with facial trauma.  We will check a CT head and orbits.  This is      not urgent as the patient is neurologically intact  and can be done in      the morning.  8. Anemia.  As stated we will transfuse 2 units of packed red blood  cells.     She may need a  repeat EGD but for now we will heme check stools, hold      aspirin, Indocin, and Plavix, and review previous oncology workup.      Melissa L. Ladona Ridgel, MD  Electronically Signed     MLT/MEDQ  D:  01/13/2006  T:  01/13/2006  Job:  [id number]   cc:   Royetta Crochet, MD  Francisca December, M.D.

## 2010-08-16 NOTE — Discharge Summary (Signed)
[name], [name]               ACCOUNT NO.:  [id number]   MEDICAL RECORD NO.:  [id number]          PATIENT TYPE:  OIB   LOCATION:  2020                         FACILITY:  [facility]   PHYSICIAN:  Francisca December, M.D.  DATE OF BIRTH:  06/23/1945   DATE OF ADMISSION:  11/28/2004  DATE OF DISCHARGE:  12/03/2004                                 DISCHARGE SUMMARY   PRIMARY CARE PHYSICIAN:  Dr. Majel Homer.   DISCHARGING PHYSICIAN:  Dr. Corliss Marcus.   CHIEF COMPLAINT AND REASON FOR ADMISSION:  [name] is a 65 year old  female patient with a 71-month history of exertional chest pain evaluated at  Dr. Amil Amen office on November 27, 2004.  She underwent a Cardiolite study,  which showed evidence of ischemia in the distribution of the left anterior  descending artery.  She also had EKG changes.  During exercise portion of  the stress test, was 1.5-2 mm ST segment depression at peak stress, and a  Cardiolite study showed a reversible apical defect.  She also has a history  of recently diagnosed dyslipidemia and has just been started in the past few  months on Lipitor.  She also has osteoarthritis and depression and is on  multiple medications for these conditions.  She has been admitted through  short stay for diagnostic coronary angiography on November 28, 2004.   HOSPITAL COURSE:  1.  Abnormal Cardiolite and chest pain:  Patient did undergo cardiac      catheterization on November 28, 2004.  This did show a proximal LAD      lesion, 95%.  Please refer to Dr. Amil Amen' cath report for specific      details.  But, she did undergo IVUS procedure of this vessel and      eventual PCI and Taxus stent implantation to the proximal LAD.  She had      normal circumflex and 30-40% mid RCA disease.   From a cardiac standpoint, the patient has done very well.  Her right groin  was unremarkable 24 hours postprocedure and has remained unremarkable  throughout the hospitalization.  She did have some  dyspnea, tachycardia, and  some chest pain 3 days postprocedure.  Some of this was attributed to  probable pulmonary etiology, but she was started on beta blocker medication  with resolution of the tachycardia and was started on long-acting nitrates  and has had no further occurrences of chest discomfort.  1.  Anemia:  Patient had routine blood work prior to admission.  Her      preadmission hemoglobin was 10.6; within the first 12 hours of cath, was      down to 9.6, and by the morning of post-cath, the patient's hemoglobin      had drifted down to 8.7.  This was repeated to determine if is was a      spurious result.  Repeat level was down to 8.4.  Anemia indices were      drawn.  Patient subsequently received 2 units of packed red blood cells.      The following day, patient hemoglobin after  packed red blood cells was      up to 9.8.  Stool was heme-negative for blood x1 collection.  RBC folate      was 918, B12 normal at 677, ferritin normal at 5, iron normal at 192,      TIBC 392, and percent iron was 49.  At this time, there is no evidence      of blood loss anemia, and we will have the patient follow up with her      primary care physician as an outpatient to further evaluate the anemia      etiology.  2.  Dyspnea:  This was noted to be a problem prior to admission, as well as      within the first 24 hours after admission.  Prior to admission, patient      stated it would take walking around her home inside for several hundred      feet, i.e. going back and forth from her bedroom to the kitchen several      times before she would become short winded.  In the first 24 hours      postprocedure, walking from the bed to her bathroom, which was less than      15 feet in her hospital room, would elicit significant dyspnea.  Because      the patient's hemoglobin was low, we initially thought that this was a      contributing factor, but then patient developed tachycardia and       worsening dyspnea.  Chest x-ray was done and showed increasing      atelectasis in the bases, left greater than the right.  She was started      on IV Rocephin with significant improvement in symptoms.  CBCs were also      checked.  Her white count 24 hours postprocedure was 10.4; on the day of      the increase in dyspnea and tachycardia, had gone up to 15,000, and then      24 hours later up to 17,000.  Repeat CBC on September 4, showed a white      count down to 12,300.  3.  Elevated LFTs:  On preprocedure blood work, it was noted that the      patient's LFTs were elevated.  Alkaline phosphatase 189, AST 50, and ALT      205.  When Dr. Amil Amen reviewed the patient's old records, it was noted      that prior to the initiation of Lipitor, LFTs were within normal limits.      Her Lipitor was stopped this hospitalization and her tramadol dose was      decreased in half since this medication must be used in caution with      patients with impaired hepatic function.  We will be following up on      these LFTs at our office.  Also instruct patient to not use Tylenol      regularly.  She states she does not use Tylenol on a regular basis.   RECENT LABS:  On December 02, 2004, white count was 12,300, hemoglobin 9.1,  hematocrit 26.7, platelets 410,000.  Sodium 139, potassium 3.9, BUN 18,  creatinine 0.9, glucose 112.   DISCHARGE DIAGNOSES:  1.  Chest pain with subsequent abnormal Cardiolite study.  2.  Single-vessel coronary artery disease involving the proximal left      anterior descending, status post percutaneous coronary intervention,  IVUS, and Taxus stent to same.  3.  Residual nonobstructive disease in the mid right coronary artery.  4.  Elevated liver function tests, probably secondary to statin therapy.  5.  Dyslipidemia.  6.  Anemia, status post transfusion 2 units packed red blood cells with      Hemoccult negative stools. 7.  Shortness of breath, tachycardia, and  leukocytosis, presumed pulmonary      etiology.  8.  Bilateral atelectasis on chest x-ray November 30, 2004.  9.  Osteoarthritis.   DISCHARGE MEDICATIONS:  1.  Wellbutrin XL 200 mg daily.  2.  Zoloft 100 mg daily.  3.  Cymbalta 60 mg daily.  4.  Clonazepam 1 mg daily.  5.  Diazepam 10 mg p.r.n.  6.  Stop Lipitor.  7.  Lodine 300 mg daily.  8.  Tramadol 50 mg t.i.d. p.r.n.  9.  Amitriptyline 150 mg at hour of sleep.  10. Continue multiple vitamins, Osteo-Bi-Flex, and Caltrate as before.  11. Aspirin 325 daily.  This is new.  12. Plavix 75 mg daily.  This is new.  13. Isosorbide mononitrate 60 mg daily.  This is new.  14. Lopressor 50 mg one-half tab b.i.d.  This is new.  15. Omnicef 300 mg b.i.d. times 4 more days.  This is also new.   DIET:  Heart healthy.   ACTIVITY:  Increase activity slowly.  Wound care not applicable at this  point.   FOLLOWUP:  Dr. Amil Amen September 21st at 9:20 a.m.; Dr. Stacie Acres September 8th  at 10:15 a.m.   The patient has also been enrolled in the [employer] study.  She has been  instructed to take aspirin 325 mg daily for at least 12 months, then Plavix  75 mg daily for 12 months.  This is being followed by [facility]  [employer].      Allison L. Rolene Course      Francisca December, M.D.  Electronically Signed    ALE/MEDQ  D:  12/03/2004  T:  12/03/2004  Job:  [id number]   cc:   Royetta Crochet, MD  Fax: [phone]

## 2010-08-16 NOTE — Op Note (Signed)
[name], [name]                          ACCOUNT NO.:  [id number]   MEDICAL RECORD NO.:  [id number]                   PATIENT TYPE:  AMB   LOCATION:  [facility]                                 FACILITY:  [facility]   PHYSICIAN:  Bertram Millard. Dahlstedt, M.D.          DATE OF BIRTH:  1946-01-17   DATE OF PROCEDURE:  05/15/2003  DATE OF DISCHARGE:                                 OPERATIVE REPORT   PREOPERATIVE DIAGNOSIS:  Bladder tumor.   POSTOPERATIVE DIAGNOSIS:  Bladder tumor.   PRINCIPAL PROCEDURES:  1. Cystoscopy.  2. Transurethral resection of bladder tumor.   SURGEON:  Bertram Millard. Dahlstedt, M.D.   ANESTHESIA:  General with LMA.   COMPLICATIONS:  None.   BRIEF HISTORY:  A 65 year old female presenting with a one to two-year  history of intermittent gross hematuria.  She was evaluated in the office.  Upper tract studies included a CT of the abdomen and pelvis, which was  essentially unremarkable.  She had a cystoscopy which showed a bladder tumor  in her right lateral wall.  She presents at this time for TURBT.  She has  had normal chest x-ray.   DESCRIPTION OF PROCEDURE:  The patient was taken to the operating room,  where a general anesthetic was administered using the LMA.  She was placed  in the dorsal lithotomy position.  Genitalia and perineum were prepped and  draped.  A 22 French panendoscope was introduced into the bladder.  There  was one small polyp at the bladder neck at the 5 o'clock position and what  appeared to be squamous metaplasia in the trigone.  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  The papillary tumor was resected  with the cold cup biopsy forceps in three separate bites.  The base was then  cauterized.  The polyp at the bladder neck was then biopsied and sent  separately as trigone.  Additionally, some of the tissue around this,  which appeared to be squamous metaplasia, was biopsied.  This site was then  cauterized.  At this point, after  the bladder was thoroughly inspected and  no further tumors were seen, the scope was removed after the bladder was  drained.   The patient tolerated the procedure well.  She received a B&O suppository at  the end of the procedure.                                               Bertram Millard. Dahlstedt, M.D.    SMD/MEDQ  D:  05/15/2003  T:  05/15/2003  Job:  [id number]

## 2010-08-16 NOTE — Cardiovascular Report (Signed)
NAMEJOVONDA, Ware               ACCOUNT NO.:  [id number]   MEDICAL RECORD NO.:  [id number]          PATIENT TYPE:  OIB   LOCATION:  6532                         FACILITY:  [facility]   PHYSICIAN:  Francisca December, M.D.  DATE OF BIRTH:  16-Dec-1945   DATE OF PROCEDURE:  11/28/2004  DATE OF DISCHARGE:                              CARDIAC CATHETERIZATION   PROCEDURES PERFORMED:  1.  Left heart catheterization.  2.  Left ventriculogram.  3.  Coronary angiography.  4.  PCI/drug-eluting stent implantation proximal LAD.  5.  Percutaneous closure right femoral artery.   INDICATIONS:  [name] is a pleasant 65 year old woman who has  developed atypical anginal syndrome, however, has had multiple episodes at  rest. She underwent a myocardial perfusion study with exercise in the office  that revealed ischemic electrocardiographic changes, reproduction of chest  discomfort and a reversible anteroapical defect. She is brought to  catheterization laboratory at this time to identify the extent of disease  and provide further therapeutic options.   PROCEDURE NOTE:  The patient is brought to cardiac catheterization  laboratory in the fasting state. The right groin was prepped and draped in  the usual sterile fashion. Local anesthesia was obtained with infiltration  of 1% chronic lidocaine. A 5-French catheter sheath was inserted  percutaneously into the right femoral artery utilizing an anterior approach  over a guiding J-wire. A 110 cm pigtail catheter was used to measure  pressures in the ascending aorta and in the left ventricle both prior to and  following the ventriculogram. The 30 degree RAO cine left ventriculogram was  performed utilizing power injector.  36 cc of contrast were injected 12 cc  per second. Left and right coronary angiography was performed using 5-French  #4 left and right Judkins catheters. Cineangiography of each coronary was  conducted in multiple LAO and RAO  projections. I then proceeded with  coronary intervention.   The 5-French catheter sheath was exchanged for a 6-French catheter sheath  over a long guiding J-wire. A 6-French number 3.5 CLS guiding catheter was  advance to the ascending aorta where the left coronary os was engaged. The  patient then received 5500 units of heparin intravenously as well as a bolus  of Aggrastat at 25 mcg/kg and a constant infusion. The resultant ACT was 274  seconds. A 0.014 inch Scimed Luge intracoronary guidewire was then passed  across the lesion in the proximal LAD without difficulty. Intravascular  ultrasound was then performed using a Scimed Atlantis catheter. A single  pullback was performed. This did not reproduce significant chest pain for  the patient and ischemic electrographic changes. The images were reviewed  and a 3.0/60 mm Scimed Taxus intracoronary stent was advanced into place in  the proximal LAD. It was deployed at peak pressure of 12 atmospheres for  approximately 35 seconds. Stent balloon was then deflated and removed and  intravascular ultrasound repeated. Following this, 3.0/8 mm Scimed Quantum  Maverick intracoronary balloon was advanced into place and inflated to peak  pressure 20 atmospheres for about 30 seconds. This balloon was deflated and  removed  and intravascular ultrasound repeated. Confirmation of adequate  patency was then confirmed in orthogonal views both with and without the  guidewire in place. The guiding catheter was then moved. A right femoral  arteriogram at 45 degrees angulation was performed by hand injection via the  catheter sheath. It confirmed adequate anatomy for placement of percutaneous  closure device AngioSeal. This was deployed successfully with good  hemostasis and intact distal pulse. The patient was then transported to the  recovery area in stable condition.   HEMODYNAMIC RESULTS:  Systemic arterial pressure was 125/72 with a mean of  95 mmHg.  There was no systolic gradient across the aortic valve. Left  ventricular end-diastolic pressure was 11 mmHg pre ventriculogram.   ANGIOGRAPHY:  Left ventriculogram demonstrated normal chamber size and  normal global systolic function without regional wall motion abnormality. A  visual estimate of the ejection fraction is 60-65%. There was left coronary  calcification seen. There was no mitral regurgitation. The aortic valve was  trileaflet and opens normally.   There was a right dominant coronary system present. The main left coronary  was normal.   Left anterior descending artery and its branches are highly diseased; there  is a proximal irregular tubular 90-95% stenosis. There is then a 30-40%  stenosis at the origin of the second diagonal branch. The ongoing anterior  descending artery is then large, transapical and without significant  obstruction. The first diagonal branch is small and arises just distal to  the lesion. The second diagonal branch is moderate in size and has no  significant obstruction.   The left circumflex coronary artery and its branches showed no significant  obstruction. There are several luminal irregularities. There is a very small  ramus intermedius branch and then two very small marginal branches. The  third marginal branch is large and is the dominant vessel on the obtuse  margin of the heart. Again, no significant obstruction on the left  circumflex system were seen.   The right coronary artery and its branches are mildly diseased; there is a  diffuse 30% narrowing in the proximal and midportion. The distal portion is  without significant obstruction. It gives rise to a moderate size poster  descending artery and a small posterolateral segment with one small left  ventricular branch.   Collateral vessels are not seen.   Following balloon dilatation and stent implantation of the proximal anterior descending artery, there is no residual stenosis. The  stent extends from  within a millimeter of the ostium to about 3 mm beyond the first diagonal  branch.   FINAL IMPRESSION:  1.  Atherosclerotic cardiovascular disease, single vessel.  2.  Status post successful percutaneous coronary intervention/drug-eluting      stent implantation proximal anterior descending artery.  3.  Intact left ventricular size and global systolic function, ejection      fraction 60%.  4.  Typical angina was reproduced with device insertion and balloon      inflation.  5.  The patient is a participant in the [employer] study.      Francisca December, M.D.  Electronically Signed     JHE/MEDQ  D:  11/28/2004  T:  11/28/2004  Job:  [id number]   cc:   Royetta Crochet, MD  Fax: [phone]

## 2010-08-16 NOTE — Discharge Summary (Signed)
Tracey, Ware               ACCOUNT NO.:  [id number]   MEDICAL RECORD NO.:  [id number]          PATIENT TYPE:  INP   LOCATION:  2923                         FACILITY:  [facility]   PHYSICIAN:  Francisca December, M.D.  DATE OF BIRTH:  Aug 29, 1945   DATE OF ADMISSION:  07/28/2007  DATE OF DISCHARGE:  07/29/2007                               DISCHARGE SUMMARY   DISCHARGE DIAGNOSES:  1. Ischemic cardiomyopathy.  2. Chronic systolic congestive heart failure.  3. Bundle-branch block.  4. Chronic hypertension.  5. Chronic anemia.   HOSPITAL COURSE:  [name] is a 65 year old female with  ischemic cardiomyopathy.  She had minimal improvement in LV function  following a stent to her LAD.  She has been set up for insertion of an  ICD/Bi-V for prophylaxis against sudden death and the treatment of heart  failure.  This device was implanted on 07/28/2007, without difficulty.  She remained in the hospital overnight.  The incision looked great.  Chest x-ray showed no pneumothorax.  The patient was then discharged to  home.   HOME MEDICATIONS:  1. Neurontin 600 mg 4-5 times a day.  2. Vicodin 7.5/750 every hour p.r.n.  3. K-Dur 20 mEq daily.  4. Lasix 80 mg 1-1/2 tablet daily.  5. Ramipril 2.5 mg a day.  6. Spironolactone 25 mg a day.  7. Vytorin 10/20 mg 1 tablet daily.  8. Iron 324 mg 3 times a day.  9. Wellbutrin XL 150 mg 3 tablets daily.  10.Lorazepam 1 mg 2-3 tablets daily p.r.n.  11.Ambien 10 mg nightly.  12.Carvedilol 12.5 mg 1-1/2 tablet p.o. b.i.d..  13.Aspirin 325 mg a day.  14.NitroQuick 0.4 mg p.r.n.   DISCHARGE INSTRUCTIONS:  The patient was given instructions regarding  activity and wound care as per our post hospital device implantation  orders.      Guy Franco, P.A.      Francisca December, M.D.  Electronically Signed    LB/MEDQ  D:  08/16/2007  T:  08/17/2007  Job:  [id number]

## 2010-08-16 NOTE — H&P (Signed)
[name], [name]               ACCOUNT NO.:  [id number]   MEDICAL RECORD NO.:  [id number]          PATIENT TYPE:  OBV   LOCATION:  5703                         FACILITY:  [facility]   PHYSICIAN:  Jackie Plum, M.D.DATE OF BIRTH:  July 25, 1945   DATE OF PROCEDURE:  DATE OF DISCHARGE:                      STAT - MUST CHANGE TO CORRECT WORK TYPE   REASON FOR ADMISSION:  1. Iron-deficiency anemia (EGD done in December 2006 has indicated good be      erosive esophagitis with hiatal hernia and clean-based gastric ulcer.      Colonoscopy done in December 2006 indicated normal terminal ileum and      colon.  The patient had been receiving intermittent transfusions)  2. History of coronary artery disease.  3. History of dyslipidemia with elevated liver function tests thought to      be related to statin therapy.  4. Osteoarthritis.   ADMITTING MEDICATIONS:  The patient is on Elavil, Lipitor, Wellbutrin,  calcium, Klonopin, ferrous sulfate, Prozac, Mucinex, Lamictal, Lopressor,  multivitamins, Relafen, Protonix, and omega-3.   COMMENTS:  The patient was admitted by her primary care physician, Dr.  Stacie Acres, to the hospital for blood transfusion.  The patient has history of  anemia and received intermittent transfusions.  She has history of coronary  artery disease and was noted to have suboptimal hemoglobin of around 8-10  according to the patient.  The patient has planned for surgery and because  of this, it was felt that with her __________ history of coronary artery  disease, it would be appropriate to keep the patient's hemoglobin around 10,  optimize her __________ before surgery.  The patient is admitted for  transfusion.  She denies any chest pain.  She denies any shortness of  breath.  No heat or cold intolerance, __________, melena, hemoptysis,  hematuria.  No chest pain.  No PND or orthopnea.   REVIEW OF SYSTEMS:  As in the HPI above.  Otherwise no significant  symptomatology on  systemic inquiry was able to be clarified.  __________ of  the history despite talking to the nursing staff and the patient.  However,  the patient denies any abdominal pain or chest pain at the moment.  She only  complains of some back pain.   PAST MEDICAL HISTORY:  Please see problem list above.   MEDICATIONS:  As listed above.   ALLERGIES:  The patient does not have any known medication allergies.   PHYSICAL EXAMINATION:  VITAL SIGNS:  Stable with blood pressure 101/69,  pulse 88, respirations 20, temperature 98.2.  GENERAL:  She is not in acute pulmonary distress.  She was __________.  HEENT:  Normocephalic, atraumatic.  Pupils were equal, round, reactive to  light.  Extraocular muscles intact.  Oropharynx moist.  Sclerae:  Pallor  present.  No icterus.  NECK:  Supple.  No JVD.  LUNGS:  Clear to auscultation.  CARDIAC:  Regular.  No __________.  No gallops.  ABDOMEN:  Soft.  Bowel sounds were present.  EXTREMITIES:  No cyanosis.   LABORATORY DATA:  None available at this time.   ASSESSMENT/PLAN:  The patient is admitted  for further transfusion.  Dr.  Georgina Pillion will __________ and possibly discharge tonight or tomorrow morning.      Jackie Plum, M.D.  Electronically Signed     GO/MEDQ  D:  11/13/2005  T:  11/13/2005  Job:  [id number]   cc:   Royetta Crochet, MD

## 2010-08-16 NOTE — Assessment & Plan Note (Signed)
[name] is back regarding her pain syndrome.  Her feet actually are doing  fairly well.  She has a few aches and pains here and there.  Her main  issue has been her trigeminal neuralgia which has flared up over the  last couple of weeks.  It has been very sensitive to the temperature and  other external stimuli.  Pain will range from 1 to 10 depending on her  environment.  The pain is described as intermittent, burning, stabbing  over the face and to the teeth.  Pain is worse when she eats and talks  for long periods of time.  Pain interferes with general activity,  relations with others, enjoyment of life on a moderate-to-severe level.  Her hydrocodone helps for break-through pain.  She had been on Lamictal  previously.  She had thought it was primarily for mood control.  Her  psychiatrist had removed it in September.  There is question whether she  was on this somewhat to control the trigeminal symptoms.   REVIEW OF SYSTEMS:  Positive for depression and anxiety.  Some loss of  taste.  She denies any constitutional, GI, GU, cardiorespiratory  complaints today.   SOCIAL HISTORY:  Without significant change.   PHYSICAL EXAMINATION:  VITAL SIGNS:  Blood pressure 120/78, pulse 107,  respiratory rate 16.  She is satting 97% on room air.  GENERAL:  The patient is pleasant, in no acute distress.  She is alert  and oriented times 3.  HEART:  Regular rate and rhythm.  LUNGS:  Clear.  ABDOMEN:  Soft and nontender.  HEENT: Face is slightly sensitive to palpation along the right side.  No  skin color or temperature changes were appreciated.  NEUROLOGICAL:  She had good speech and language function.  Good motor  function of the face.  Gait was stable with good coordination.  Reflexes  2+.  Strength 5/5.  EXTREMITIES:  Normal range of motion essentially throughout the knees,  ankles, feet.   ASSESSMENT:  1. 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  2. Bilateral total knee replacements.  3. Anxiety/bipolar disorder.  4. History of trigeminal neuralgia with flare.   PLAN:  1. Restart Lamictal at 100 mg q.d.  Consider increasing back to 200 mg      maintenance dose that she had been on previously.  We will observe      for symptomatology.  Encouraged desensitization exercises.  The      patient with follow up with Dr. Donell Beers regarding further use of      Lamictal from a psychiatric standpoint although in this situation I      think the pain is the first priority.  2. Refill hydrocodone 7.5/750 1 q.6h. p.r.n. #90 for break-through      pain.  3. Continue appropriate shoe wear and exercise.  4. The patient will keep scheduled appointment in June.      [name], M.D.  Electronically Signed     ZTS/MedQ  D:  04/03/2006 09:53:04  T:  04/03/2006 10:38:22  Job #:  [id number]   cc:   Royetta Crochet, MD  Fax: [phone]   Archer Asa, M.D.

## 2010-08-16 NOTE — Op Note (Signed)
[name], [name]               ACCOUNT NO.:  [id number]   MEDICAL RECORD NO.:  [id number]          PATIENT TYPE:  AMB   LOCATION:  ENDO                         FACILITY:  [facility]   PHYSICIAN:  Shirley Friar, MDDATE OF BIRTH:  03-Sep-1945   DATE OF PROCEDURE:  03/26/2005  DATE OF DISCHARGE:                                 OPERATIVE REPORT   PROCEDURE:  Upper endoscopy.   INDICATIONS FOR PROCEDURE:  Iron deficiency anemia.   ANESTHESIA:  Fentanyl 25 mcg IV, Versed 2.5 mg IV, remaining medicines given  with preceding colonoscopy.   FINDINGS:  The endoscope was inserted into the oropharynx and the esophagus  was intubated.  In the distal esophagus, there was grade B erosive  esophagitis with a hiatal hernia noted.  The endoscope was advanced down  into the stomach and into the distal portion of the stomach where a small 2  mm clean base ulcer was noted in the antrum.  The endoscope was then  advanced further into the duodenal bulb and down into the second portion of  the duodenum.  Careful withdrawal of the endoscope revealed normal second  portion of the duodenum and duodenal bulb.  The endoscope was withdrawn back  into the stomach and, again, a 2 mm clean base ulcer was noted in the  antrum.  Retroflexion was done and revealed normal angularis, cardia, and  fundus, except for hiatal hernia.  The endoscope was withdrawn and the  esophagus was re-evaluated to again confirm erosive esophagitis and the  endoscope was withdrawn to confirm the above findings.   ASSESSMENT:  1.  Grade B erosive esophagitis.  2.  Clean based gastric ulcer.  3.  Hiatal hernia.   PLAN:  1.  Avoid NSAIDs.  2.  Start Nexium 40 mg p.o. daily.  3.  Follow up in GI clinic in three months which will be scheduled by the      office.  4.  May need small bowel evaluation with capsule endoscopy in the near      future.  5.  Obtain records from hematology doctor.      Shirley Friar, MD  Electronically Signed     VCS/MEDQ  D:  03/26/2005  T:  03/26/2005  Job:  [id number]   cc:   Royetta Crochet, MD  Fax: [phone]   Lauretta I. Odogwu, M.D.  Fax: [phone]

## 2010-08-16 NOTE — Assessment & Plan Note (Signed)
[name] is back regarding her bilateral foot pain.  We injected her bilateral  first tarsometatarsal joint and she had great relief, especially for the  first 10 days with injections.  She has had a little bit of pain return  since then, but still not back to where she was having discomfort prior.  I  started her on a little bit of Indocin, which apparently caused some  bruising, and she has come off of this since.  The patient brought in her  shoe inserts for me today.  One of these is a plantar fascia splint  encompassing the arch and heel, and the other 1 was more of a soft insole  with medial arch support.  She is coming along gradually with her knee  replacement, now approximately 2 months out.  The patient rates her pain as  a 5/10.  Describes it as dull and burning.  Definitely bothers her more when  she is up and active.  Pain interferes with general activity and relations  of others, and enjoyment of life on a moderate level.  Sleep is fair to  poor.  Pain is better with rest, heat, and her medications.  She uses 2 to 3  hydrocodone 7.5 a day for breakthrough tenderness.   REVIEW OF SYSTEMS:  The patient reports weight gain over the last several  months.  Depression/anxiety somewhat, although these seem to be improving a  bit.  Full review is in the health history section.   SOCIAL HISTORY:  The patient is married, living with her husband.  She is  still smoking a half pack of cigarettes a day.   PHYSICAL EXAM:  Blood pressure 147/69, pulse 108, respiratory rate 16, she  is satting 97% on room air.  The patient is pleasant in no acute distress.  She is alert and oriented x3.  Affect is bright and appropriate.  Gait is less antalgic today.  She brought in her [employer] tennis shoes today, which seem to be  appropriate in fit.  We also looked at her custom insoles that were made.  The patient has much less pain over the TMT joints today.  No bruising or  swelling was seen.   Pulses were 2+.  SKIN:  Was normal in color and appearance.  Motor function is 5/5 in all 4 extremities.  No radicular signs were seen.  Reflexes are 2+.  Knee wounds were clean, dry, and intact.  Left knee  appears to be healing nicely.   ASSESSMENT:  1. Chronic bilateral foot pain most consistent with tarso-metatarsal      arthralgia.  I do not believe she has complex regional pain syndrome.      She is doing substantially better today.  2. Status post bilateral total knee replacements.  3. History of anxiety/bipolar disorder.   PLAN:  1. We held off on further injections today as she is doing better.  2. We will replace Indocin with medical food supplements, Limbrel.  It is      a Lox-5 inhibitor, which is slow in action and safe for bowels,      kidneys, coagulation, et Karie Soda.  3. Refilled hydrocodone 7.5/750 two to 3 times per day p.r.n.  4. Encouraged the patient to wear her tennis shoes as able.  I think the      current insoles in the shoe are adequate.      She does need to add extra arch support.  I would stay away from  the      plantar fascia inserts for now.  5. I will see her back in about 2 months' time.  She has actually done      quite nicely.      [name], M.D.  Electronically Signed     ZTS/MedQ  D:  01/20/2006 15:54:07  T:  01/21/2006 15:57:55  Job #:  [id number]   cc:   Royetta Crochet, MD  Fax: [phone]

## 2010-08-16 NOTE — Discharge Summary (Signed)
NAMEHAYLEEN, CLINKSCALES               ACCOUNT NO.:  [id number]   MEDICAL RECORD NO.:  [id number]          PATIENT TYPE:  INP   LOCATION:  2011                         FACILITY:  [facility]   PHYSICIAN:  Kela Millin, M.D.DATE OF BIRTH:  01/29/1946   DATE OF ADMISSION:  01/13/2006  DATE OF DISCHARGE:  01/16/2006                                 DISCHARGE SUMMARY   DISCHARGE DIAGNOSES:  1. Symptomatic anemia.  2. Urinary tract infection.  3. Coronary artery disease status post stents in 2006 to the proximal left      anterior descending.  Last recheck showed ejection fraction 60-65%.  4. History of hyperlipidemia.  5. Tobacco abuse.   CONSULTATIONS:  1. Cardiology - Dr. Amil Amen.  2. Gastroenterology - Dr. Ewing Schlein and Dr. Bosie Clos.   PROCEDURES AND STUDIES:  CT scan of the head showed minimal hypoattenuation  consistent with frontal horn of the right lateral ventricle likely  reflecting chronic white-matter disease and CT scan of the orbits showed  minimal soft tissue swelling over the left zygomatic arch without underlying  fracture.  EGD done on January 15, 2006, per Dr. Ewing Schlein showed a tiny antral ulcer and  biopsies done.   HISTORY:  The patient is a 65 year old white female with past medical  history significant for chronic anemia who had been seen in the past by Dr.  Dalene Carrow for its work up, she also has coronary artery disease status post  stents, depression, hypercholesterolemia and chronic back pain.  It was  reported that she has gone to see Dr. Amil Amen and was complaining of  worsening with dizziness and falls with increased bruising over the last two  week.  The patient reported that she had been started on indomethacin  approximately two weeks prior to presentation and thereafter she had noted  increased bruising and subsequently developed dizziness and fell prior to  this admission.  The patient was directly admitted from Dr. Amil Amen' office  to [facility] service  for evaluation of symptomatic anemia.   PHYSICAL EXAMINATION:  Upon admission as per Dr. Ladona Ridgel revealed a  temperature of 98 with a pulse of 102, respiratory rate 22, blood pressure  139/82 and other pertinent findings from exam:  HEENT:  Conjunctiva pallor and a bruise noted over her cheek bone on the  left.  EXTREMITIES:  Bruises noted on her left upper and left lower extremities.   Further physical exam was reported to be within normal limits.   LABORATORY DATA:  Her white cell count was 11.2, hemoglobin 8.2, hematocrit  25.2, platelet count of 561, sodium 138, potassium 4, chloride 107, CO2 24,  BUN 22, creatinine 0.8, glucose 107, alkaline phosphatase 120, LFTs within  normal limits.  PT/INR 12.9 and 1, respectively.   HOSPITAL COURSE:  Problem #1:  Symptomatic anemia from iron deficiency.  Upon admission gastroenterology was consulted and the patient was transfused  with packed red blood cells.  Labs done at Dr. Amil Amen' office showed an  iron of 42, TIBC elevated at 456 with an iron saturation of 9 and percent  saturation of 326.  Gastroenterology was  consulted and Dr. Ewing Schlein saw the  patient and did an EGD on January 15, 2006 with the result as stated above.  The patient did not have any evidence of GI bleeding during her hospital  stay.  There was no hematemesis and no hematochezia, also no melena  reported.  It was noted that the patient has been on oral supplementation at  home, but despite that, the patient's indices were consistent with iron  deficiency.  It was noted that the patient was seen by Dr. Dalene Carrow in the  past and IV iron was to be considered.  The patient's H&H improved following  transfusion and her last H&H prior to discharge today is hemoglobin of 11.2,  hematocrit 34.5.  Plavix and aspirin were held during her hospital stay.  She has remained hemodynamically stable with no evidence of GI bleeding.  She is tolerating a p.o. diet well and she will be  discharged at this time  to followup with her primary care physician and gastroenterology.  She  remains hemodynamically stable at this time.   Problem #2:  Urinary tract infection.  The patient was noted to have  leukocytosis upon admission and a urinalysis was consistent with a UTI-  large leukocytes, nitrite negative with too numerous to count WBCs.  The  patient was empirically started on antibiotics.  Urine cultures were done  but just grew multiple bacterial  morphotype, none predominant.  She has  remained afebrile and the leukocytosis has resolved.   Problem #3:  Coronary artery disease.  The patient remained chest pain free  during her hospital stay.  She was seen by cardiology and recommended  keeping her off of Plavix.  She was maintained on her Lipitor.  She will  continue her aspirin 81 mg upon discharge and she is to followup with Dr.  Amil Amen.   Problem #4:  Hyperlipidemia.  The patient was maintained on Lipitor during  her hospital stay.   DISCHARGE MEDICATIONS:  1. Cipro 250 mg p.o. b.i.d. x2 more days.  2. Plavix discontinued as above.  3. The patient is to continue other pre-admission medications except as      indicated above.   FOLLOWUP CARE:  Dr. Dalene Carrow wanted the patient to call for an appointment.  Gastroenterology - Dr. Magod/Schooler - patient offered appointment.  Dr. Majel Homer in 1-2 weeks.  The patient is to followup with Dr. Amil Amen as scheduled.      Kela Millin, M.D.  Electronically Signed     ACV/MEDQ  D:  01/16/2006  T:  01/17/2006  Job:  [id number]   cc:   Royetta Crochet, MD  Francisca December, M.D.  Shirley Friar, MD

## 2010-08-16 NOTE — Op Note (Signed)
[name], [name]                          ACCOUNT NO.:  [id number]   MEDICAL RECORD NO.:  [id number]                   PATIENT TYPE:  INP   LOCATION:  5034                                 FACILITY:  [facility]   PHYSICIAN:  Lubertha Basque. Jerl Santos, M.D.             DATE OF BIRTH:  Dec 02, 1945   DATE OF PROCEDURE:  08/01/2003  DATE OF DISCHARGE:                                 OPERATIVE REPORT   PREOPERATIVE DIAGNOSES:  Right knee degenerative arthritis.   POSTOPERATIVE DIAGNOSES:  Right knee degenerative arthritis.   OPERATION PERFORMED:  Right total knee replacement.   SURGEON:  Lubertha Basque. Jerl Santos, M.D.   ASSISTANT:  Prince Rome, P.A.   ANESTHESIA:  General and femoral nerve block.   INDICATIONS FOR PROCEDURE:  The patient is a 65 year old woman with a long  history of severe degenerative arthritis, right knee with lesser changes on  the opposite side.  She has failed many different oral anti-inflammatories  as well as injectables.  She has end stage degeneration on x-rays and has  pain which limits her ability to rest and remain active.  She was offered a  knee replacement as an option.  Informed operative consent was obtained  after discussion of possible complications of reaction to anesthesia,  infection, DVT, PE, and death.   DESCRIPTION OF PROCEDURE:  The patient was taken to the operating suite  where general anesthetic was applied without difficulty.  She was also given  a block in the preanesthesia area.  The patient was positioned supine and  prepped and draped in the normal sterile fashion. After administration of  preop intravenous antibiotics, the right leg was elevated, exsanguinated,  and tourniquet inflated about the thigh. A longitudinal anterior incision  was made.  All appropriate anti-infective measures were used including  closed hooded exhaust system for each member of the surgical team,  preoperative intravenous antibiotic and Betadine impregnated  drape.  Dissection was carried down to the extensor mechanism.  A medial  parapatellar incision was made in this structure.  The knee cap was flipped  and the knee flexed.  She had severe degenerative change in the medial  compartment.  She had good bone quality.  The ACL and the PCL were intact  and were removed.  Some residual meniscal tissues were removed.  An  extramedullary guide was placed on the tibia to create a cut on this surface  with slight posterior slope.  An intramedullary guide was then placed on the  femur to create anterior and posterior cuts on this bone making a flexion  gap of about 10 mm.  A slight revision had to be made on the tibial cut to  create this nice flexion gap.  The second intramedullary guide was placed on  the femur to make a distal cut creating an equal extension gap of 10 mm  balancing the knee.  The femur sized to a  standard component and the tibia  sized to a 3 component and the appropriate guides were placed and utilized.  The patella was cut down to thickness by about 8 or 9 mm.  The patella sized  to a 35 and appropriate guide was placed and utilized.  Trial components  were removed followed by pulsatile irrigation lavage of all cut bony  surfaces.  Cement was mixed including antibiotic and was pressurized onto  all three cut bony surfaces.  The aforementioned Depuy LCS components were  then placed and these were standard femur, 3 tibia, 10 mm deep dish spacer,  and 35 mm all polyethylene patella.  Excess cement was trimmed and pressure  was held on the components until the cement had hardened.  The tourniquet  was deflated and a small amount of bleeding was easily controlled with Bovie  cautery.  The patella tracked well and no lateral release was required.  A  drain was placed exiting superolaterally, the knee was again irrigated.  The  extensor mechanism was reapproximated in interrupted fashion using #1  Vicryl.  Subcutaneous tissue  reapproximated with and 2-0 undyed Vicryl  followed by skin closure with staples.  Adaptic was placed to the wound  followed by a dry gauze and loose Ace wrap.  Estimated blood loss and  intraoperative fluids can be obtained from anesthesia records as can  accurate tourniquet time which was approximately one hour.   DISPOSITION:  The patient was extubated in the operating room and taken to  the recovery room in stable condition.  Plans were for the patient be  admitted to the orthopedic surgery service for appropriate postoperative  care to include perioperative antibiotics and Coumadin plus Lovenox for DVT  prophylaxis.                                               Lubertha Basque Jerl Santos, M.D.    PGD/MEDQ  D:  08/01/2003  T:  08/01/2003  Job:  [id number]

## 2010-08-29 ENCOUNTER — Encounter (HOSPITAL_BASED_OUTPATIENT_CLINIC_OR_DEPARTMENT_OTHER): Payer: Medicare Other | Admitting: Neurosurgery

## 2010-08-29 DIAGNOSIS — M961 Postlaminectomy syndrome, not elsewhere classified: Secondary | ICD-10-CM

## 2010-08-29 DIAGNOSIS — G519 Disorder of facial nerve, unspecified: Secondary | ICD-10-CM

## 2010-08-30 NOTE — Assessment & Plan Note (Signed)
[name] is followed here for her facial pain and back pain.  She sees Dr. Riley Kill on a normal basis and states that she is still having symptoms, but not nearly as bad as she had been.  She has some sensation and pain due to the air conditioning in the office today in the right side of her face, but overall she is doing better.  She rates her pain at about 3/5.  It is intermittent.  Activity level is about 5-7.  Sleep patterns are good.  Pain is worse in the morning and late evening.  She walks without assistance.  She can walk over 30 minutes without any problems.  She drives and climbs steps.  REVIEW OF SYSTEMS:  Notable for those difficulties above as well as some depression, loss of taste and smell, and some weakness, but she functions well independently at home.  PAST MEDICAL HISTORY:  Unchanged.  SOCIAL HISTORY:  She is separated.  She lives alone.  PHYSICAL EXAMINATION:  Blood pressure 93/45, pulse 85, respirations 18, and O2 sats 97 on room air.  Motor strength is good in the upper and lower extremities.  Sensation is good.  She does have some paresthesia- type sensations in the right side of her face with touch. Constitutionally within normal limits.  She is thin.  She reports some weight loss.  Oriented x3.  Affect is bright and alert.  Overall, she is doing well.  ASSESSMENT: 1. Right-sided trigeminal neuralgia. 2. Post LAMB syndrome. 3. 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 4. Cardiomyopathy.  PLAN: 1. We went ahead and refilled her fentanyl patch 25 mcg 1 p.o. q.72     transdermally, #10 with no refill. 2. Hydrocodone 10/325 one p.o. q.6 h. p.r.n. pain, #120 with no     refill. 3. She will continue to follow up with her cardiologist. 4. She will follow up with Dr. Riley Kill in 1 month at her request.     Questions were encouraged and answered.     Lindey Renzulli L. Blima Dessert Electronically Signed    RLW/MedQ D:  08/29/2010 10:22:45  T:  08/30/2010 00:31:51  Job #:   [id number]

## 2010-09-20 ENCOUNTER — Encounter: Payer: Medicare Other | Admitting: Internal Medicine

## 2010-09-25 ENCOUNTER — Encounter: Payer: Medicare Other | Admitting: Physical Medicine & Rehabilitation

## 2010-10-08 ENCOUNTER — Emergency Department (HOSPITAL_COMMUNITY)
Admission: EM | Admit: 2010-10-08 | Discharge: 2010-10-09 | Disposition: A | Discharge status: Home / Self Care | Payer: Medicare Other | Attending: Emergency Medicine | Admitting: Emergency Medicine

## 2010-10-08 DIAGNOSIS — I252 Old myocardial infarction: Secondary | ICD-10-CM | POA: Insufficient documentation

## 2010-10-08 DIAGNOSIS — R509 Fever, unspecified: Secondary | ICD-10-CM | POA: Insufficient documentation

## 2010-10-08 DIAGNOSIS — Z96659 Presence of unspecified artificial knee joint: Secondary | ICD-10-CM | POA: Insufficient documentation

## 2010-10-08 DIAGNOSIS — R059 Cough, unspecified: Secondary | ICD-10-CM | POA: Insufficient documentation

## 2010-10-08 DIAGNOSIS — Z79899 Other long term (current) drug therapy: Secondary | ICD-10-CM | POA: Insufficient documentation

## 2010-10-08 DIAGNOSIS — I509 Heart failure, unspecified: Secondary | ICD-10-CM | POA: Insufficient documentation

## 2010-10-08 DIAGNOSIS — I2789 Other specified pulmonary heart diseases: Secondary | ICD-10-CM | POA: Insufficient documentation

## 2010-10-08 DIAGNOSIS — I2510 Atherosclerotic heart disease of native coronary artery without angina pectoris: Secondary | ICD-10-CM | POA: Insufficient documentation

## 2010-10-08 DIAGNOSIS — F17200 Nicotine dependence, unspecified, uncomplicated: Secondary | ICD-10-CM | POA: Insufficient documentation

## 2010-10-08 DIAGNOSIS — N390 Urinary tract infection, site not specified: Secondary | ICD-10-CM | POA: Insufficient documentation

## 2010-10-08 DIAGNOSIS — F29 Unspecified psychosis not due to a substance or known physiological condition: Secondary | ICD-10-CM | POA: Insufficient documentation

## 2010-10-08 DIAGNOSIS — R05 Cough: Secondary | ICD-10-CM | POA: Insufficient documentation

## 2010-10-09 ENCOUNTER — Emergency Department (HOSPITAL_COMMUNITY): Payer: Medicare Other

## 2010-10-09 LAB — URINALYSIS, ROUTINE W REFLEX MICROSCOPIC
Bilirubin Urine: NEGATIVE
Glucose, UA: NEGATIVE mg/dL
Ketones, ur: NEGATIVE mg/dL
Protein, ur: NEGATIVE mg/dL
pH: 6 (ref 5.0–8.0)

## 2010-10-09 LAB — COMPREHENSIVE METABOLIC PANEL
ALT: 20 U/L (ref 0–35)
Alkaline Phosphatase: 180 U/L — ABNORMAL HIGH (ref 39–117)
BUN: 18 mg/dL (ref 6–23)
Chloride: 97 mEq/L (ref 96–112)
GFR calc Af Amer: >60 mL/min (ref >60)
Glucose, Bld: 118 mg/dL — ABNORMAL HIGH (ref 70–99)
Potassium: 3.8 mEq/L (ref 3.5–5.1)
Total Bilirubin: 0.4 mg/dL (ref 0.3–1.2)

## 2010-10-09 LAB — CBC
HCT: 34.7 % — ABNORMAL LOW (ref 36.0–46.0)
Hemoglobin: 11.7 g/dL — ABNORMAL LOW (ref 12.0–15.0)
RBC: 4.11 MIL/uL (ref 3.87–5.11)
WBC: 16.7 10*3/uL — ABNORMAL HIGH (ref 4.0–10.5)

## 2010-10-09 LAB — DIFFERENTIAL
Basophils Absolute: 0 10*3/uL (ref 0.0–0.1)
Eosinophils Relative: 0 % (ref 0–5)
Lymphocytes Relative: 8 % — ABNORMAL LOW (ref 12–46)
Lymphs Abs: 1.3 10*3/uL (ref 0.7–4.0)
Neutro Abs: 13.4 10*3/uL — ABNORMAL HIGH (ref 1.7–7.7)
Neutrophils Relative %: 80 % — ABNORMAL HIGH (ref 43–77)

## 2010-10-09 LAB — URINE MICROSCOPIC-ADD ON

## 2010-10-11 LAB — URINE CULTURE

## 2010-10-15 LAB — CULTURE, BLOOD (ROUTINE X 2)
Culture  Setup Time: 201207110838
Culture: NO GROWTH

## 2010-10-23 ENCOUNTER — Encounter: Payer: Medicare Other | Attending: Physical Medicine & Rehabilitation | Admitting: Physical Medicine & Rehabilitation

## 2010-10-25 ENCOUNTER — Encounter: Payer: Self-pay | Admitting: *Deleted

## 2010-10-28 ENCOUNTER — Ambulatory Visit (INDEPENDENT_AMBULATORY_CARE_PROVIDER_SITE_OTHER): Payer: Medicare Other | Admitting: Internal Medicine

## 2010-10-28 ENCOUNTER — Encounter: Payer: Self-pay | Admitting: Internal Medicine

## 2010-10-28 DIAGNOSIS — Z95810 Presence of automatic (implantable) cardiac defibrillator: Secondary | ICD-10-CM

## 2010-10-28 DIAGNOSIS — I428 Other cardiomyopathies: Secondary | ICD-10-CM

## 2010-10-28 DIAGNOSIS — I5022 Chronic systolic (congestive) heart failure: Secondary | ICD-10-CM | POA: Insufficient documentation

## 2010-10-28 DIAGNOSIS — I255 Ischemic cardiomyopathy: Secondary | ICD-10-CM | POA: Insufficient documentation

## 2010-10-28 DIAGNOSIS — I2589 Other forms of chronic ischemic heart disease: Secondary | ICD-10-CM

## 2010-10-28 DIAGNOSIS — I2510 Atherosclerotic heart disease of native coronary artery without angina pectoris: Secondary | ICD-10-CM | POA: Insufficient documentation

## 2010-10-28 DIAGNOSIS — I509 Heart failure, unspecified: Secondary | ICD-10-CM

## 2010-10-28 LAB — ICD DEVICE OBSERVATION
AL AMPLITUDE: 3.8 mv
AL IMPEDENCE ICD: 312.5 Ohm
BAMS-0003: 90 {beats}/min
BATTERY VOLTAGE: 2.647 V
DEVICE MODEL ICD: 544456
HV IMPEDENCE: 41 Ohm
LV LEAD IMPEDENCE ICD: 475 Ohm
MODE SWITCH EPISODES: 6
RV LEAD IMPEDENCE ICD: 425 Ohm
TOT-0006: 20090429000000
TOT-0007: 2
TOT-0008: 0
TOT-0009: 1
TOT-0010: 17
TZAT-0012FASTVT: 200 ms
TZAT-0013FASTVT: 2
TZAT-0018FASTVT: NEGATIVE
TZAT-0020FASTVT: 1 ms
TZON-0003FASTVT: 350 ms
TZON-0003SLOWVT: 400 ms
TZON-0004FASTVT: 12
TZON-0004SLOWVT: 24
TZON-0005FASTVT: 6
TZON-0010SLOWVT: 80 ms
TZST-0001FASTVT: 2
TZST-0003FASTVT: 36 J
TZST-0003FASTVT: 36 J
VF: 1

## 2010-10-28 NOTE — Patient Instructions (Signed)
Your physician wants you to follow-up in: 1 year  You will receive a reminder letter in the mail two months in advance. If you don't receive a letter, please call our office to schedule the follow-up appointment.  Your physician recommends that you continue on your current medications as directed. Please refer to the Current Medication list given to you today.  

## 2010-10-28 NOTE — Progress Notes (Signed)
HPI: [name] is a 65 y.o. female Seen to establish a defibrillator followup.  She has a history of ischemic heart disease with prior MI  She underwent ICD implantation with a CRT [employer] device in 2009. She had a nonspecific IVCD. She does not recall significant interval benefit.  She denies chest pain. She does have dyspnea on exertion. She continues to smoke. She has intermittent left greater than right peripheral edema. Current Outpatient Prescriptions  Medication Sig Dispense Refill  . acetaminophen (TYLENOL ARTHRITIS PAIN) 650 MG CR tablet Take 650 mg by mouth every 8 (eight) hours as needed.        Marland Kitchen albuterol (PROAIR HFA) 108 (90 BASE) MCG/ACT inhaler Inhale 1 puff into the lungs every 6 (six) hours as needed.        Marland Kitchen aspirin EC 325 MG tablet Take 325 mg by mouth daily.        . carvedilol (COREG) 6.25 MG tablet 1 tab twice a day      . cyclobenzaprine (FLEXERIL) 5 MG tablet 1 tab three times a day prn      . esomeprazole (NEXIUM) 40 MG capsule Take 40 mg by mouth daily before breakfast.        . fentaNYL (DURAGESIC - DOSED MCG/HR) 25 MCG/HR Change patch every 3 days      . ferrous sulfate 325 (65 FE) MG tablet Take 325 mg by mouth 2 (two) times daily with a meal.        . furosemide (LASIX) 80 MG tablet 1 1/2 tab twice a a day      . gabapentin (NEURONTIN) 800 MG tablet 1 tab four times a day as needed      . HYDROcodone-acetaminophen (NORCO) 10-325 MG per tablet 1 tab as directed      . levothyroxine (SYNTHROID, LEVOTHROID) 50 MCG tablet 1 tab daily      . NITROSTAT 0.4 MG SL tablet       . nortriptyline (PAMELOR) 50 MG capsule 2 tabs at bed time      . promethazine (PHENERGAN) 25 MG tablet As needed for nasuea      . ramipril (ALTACE) 2.5 MG capsule daily      . simvastatin (ZOCOR) 10 MG tablet 1 tab daily      . spironolactone (ALDACTONE) 25 MG tablet 1 tab daily      . venlafaxine (EFFEXOR) 75 MG tablet 1 tab twice a day      . zolpidem (AMBIEN) 10 MG tablet 1 tab  qhs        No Known Allergies  Past Medical History  Diagnosis Date  . SOB (shortness of breath)   . CAD (coronary artery disease)   . Anemia   . CHF (congestive heart failure)   . GERD (gastroesophageal reflux disease)   . Hyperlipidemia   . Osteoarthritis   . Cardiomyopathy     No past surgical history on file.  No family history on file.  History   Social History  . Marital Status: Legally Separated    Spouse Name: N/A    Number of Children: N/A  . Years of Education: N/A   Occupational History  . Not on file.   Social History Main Topics  . Smoking status: Current Everyday Smoker  . Smokeless tobacco: Not on file  . Alcohol Use: Not on file  . Drug Use: Not on file  . Sexually Active: Not on file   Other Topics Concern  .  Not on file   Social History Narrative  . No narrative on file    Fourteen point review of systems was negative except as noted in HPI and PMH   PHYSICAL EXAMINATION  Blood pressure 89/62, pulse 87, height 5\' 3"  (1.6 m), weight 133 lb (60.328 kg).   Well developed A cachectic appearing female appearing older than her stated age in no acute distress HENT normal Some temporal wasting Neck supple with JVP-flat Carotids brisk and full without bruits Back without scoliosis or kyphosis Occasional wheeze particularly With forced expirationRegular rate and rhythm, no murmurs or gallops Abd-soft with active BS without hepatomegaly or midline pulsation Femoral pulses 2+ distal pulses intact No Clubbing cyanosis Asymmetric edema left greater than right 2+ verses Skin-warm and dry LN-neg submandibular and supraclavicular A & Oriented CN 3-12 normal  Grossly normal sensory and motor function Affect  Soberingand tearful .

## 2010-10-28 NOTE — Assessment & Plan Note (Addendum)
Continue current meds u[ptitration limited by low blood pressure

## 2010-10-28 NOTE — Assessment & Plan Note (Signed)
The patient's device was interrogated.  The information was reviewed. No changes were made in the programming.    

## 2010-10-28 NOTE — Assessment & Plan Note (Signed)
Stable on current medications 

## 2010-10-29 ENCOUNTER — Encounter (HOSPITAL_BASED_OUTPATIENT_CLINIC_OR_DEPARTMENT_OTHER): Payer: Medicare Other | Admitting: Physical Medicine & Rehabilitation

## 2010-10-29 ENCOUNTER — Encounter: Payer: Medicare Other | Attending: Physical Medicine & Rehabilitation | Admitting: Physical Medicine & Rehabilitation

## 2010-10-29 DIAGNOSIS — M19079 Primary osteoarthritis, unspecified ankle and foot: Secondary | ICD-10-CM

## 2010-10-29 DIAGNOSIS — M25519 Pain in unspecified shoulder: Secondary | ICD-10-CM | POA: Insufficient documentation

## 2010-10-29 DIAGNOSIS — G500 Trigeminal neuralgia: Secondary | ICD-10-CM

## 2010-10-29 DIAGNOSIS — M545 Low back pain, unspecified: Secondary | ICD-10-CM | POA: Insufficient documentation

## 2010-10-29 DIAGNOSIS — R51 Headache: Secondary | ICD-10-CM | POA: Insufficient documentation

## 2010-10-29 DIAGNOSIS — M961 Postlaminectomy syndrome, not elsewhere classified: Secondary | ICD-10-CM

## 2010-10-30 NOTE — Assessment & Plan Note (Signed)
[name] is back regarding her facial and back pain.  She is generally doing quite well.  She complains of some neck spasm recently which she attributes to talking on the phone too long while she works.  I think this is happening from a cardiac standpoint.  Her face has been doing well this summer with the warmer temperatures.  The back is stable. Pain is 2-4/10.  REVIEW OF SYSTEMS:  Notable for spasms, depression and loss of taste. Full 12-point review is in the written health and history section.  SOCIAL HISTORY:  Unchanged.  PHYSICAL EXAMINATION:  VITAL SIGNS:  Blood pressure is 93/43, pulse 92, respiratory rate 16 and she is satting 93% on room air. GENERAL:  The patient is pleasant and alert.  She is bit anxious per usual, but doing fairly well. EXTREMITIES:  She had some generalized pain with palpation over the right trapezius, but no focal trigger points were seen.  She had worsening pain there with lateral bending and rotation to the left. NEUROLOGIC:  Cognitively, she is alert and appropriate.  Sensory exam is unchanged over the right side of the face. HEART:  Regular. CHEST:  Clear. ABDOMEN:  Soft and nontender.  She is breathing comfortably today.  ASSESSMENT: 1. Right-sided trigeminal neuralgia. 2. Lumbar post laminectomy syndrome. 3. Osteoarthritis of the feet. 4. Shoulder/trapezius myofascial pain.  PLAN: 1. We recommended stretching, heat, ice and abstinence from activities     that are triggering this pain response. 2. We refilled her Norco 10/325 #120 and fentanyl 25 mcg #10. 3. I will see the patient back here in about 6 months.  I will have     her followup regularly with my nurse practitioner in the interim.     [name], M.D. Electronically Signed    ZTS/MedQ D:  10/29/2010 13:20:02  T:  10/29/2010 22:37:12  Job #:  [id number]

## 2010-11-27 ENCOUNTER — Encounter: Payer: Medicare Other | Attending: Neurosurgery | Admitting: Neurosurgery

## 2010-11-27 DIAGNOSIS — G500 Trigeminal neuralgia: Secondary | ICD-10-CM | POA: Insufficient documentation

## 2010-11-27 DIAGNOSIS — M19079 Primary osteoarthritis, unspecified ankle and foot: Secondary | ICD-10-CM

## 2010-11-27 DIAGNOSIS — M961 Postlaminectomy syndrome, not elsewhere classified: Secondary | ICD-10-CM | POA: Insufficient documentation

## 2010-11-27 DIAGNOSIS — F341 Dysthymic disorder: Secondary | ICD-10-CM

## 2010-11-27 DIAGNOSIS — I428 Other cardiomyopathies: Secondary | ICD-10-CM | POA: Insufficient documentation

## 2010-11-27 NOTE — Assessment & Plan Note (Signed)
[name] is back regarding her pain complaints.  She has generally done pretty well over the last month or so.  She is having some sensitivity over the right shin.  She maintains on her current medications.  She rates her pain 2-4/10.  She remains on fentanyl and hydrocodone as her base meds.  She has had a concern about losing weight and plans to see her family doctor in the near future as well as her gynecologist.  REVIEW OF SYSTEMS:  Notable for occasional depression.  Full 12-point review is in the written health and history section of chart.  SOCIAL HISTORY:  Unchanged.  PHYSICAL EXAMINATION:  VITAL SIGNS:  Blood pressure is 85/50, pulse 82, respiratory 18, and she is saturating 96% on room air. GENERAL:  The patient is pleasant, alert, and oriented x3.  Affect is generally bright and appropriate. NEUROLOGIC:  She had some sensitivity at the tibial tubercle.  There is no bruising or discoloration however, there.  Strength seemed to be 5/5 and sensory exam was generally intact except for the right side of face which remains impaired to light touch and dysesthetic frankly.  ASSESSMENT: 1. Right-sided trigeminal neuralgia. 2. Postlaminectomy syndrome. 3. Osteoarthritis of the feet. 4. Cardiomyopathy.  PLAN: 1. I refilled her fentanyl patch 25 mcg q.72 h #10 with a second     prescription for next month. 2. I refilled hydrocodone 10/325, #120 with a refill. 3. We will see her back here in 2 months with my nurse practitioner     and I will see her in 6 months.  I reassured her that this area on     her tibia is likely benign in the hypersensitive area of bone and     periosteum.     [name], M.D. Electronically Signed    ZTS/MedQ D:  11/27/2010 12:17:38  T:  11/27/2010 12:34:07  Job #:  [id number]

## 2010-12-12 ENCOUNTER — Emergency Department (HOSPITAL_COMMUNITY)
Admission: EM | Admit: 2010-12-12 | Discharge: 2010-12-12 | Disposition: A | Discharge status: Home / Self Care | Payer: Medicare Other | Attending: Emergency Medicine | Admitting: Emergency Medicine

## 2010-12-12 DIAGNOSIS — S81009A Unspecified open wound, unspecified knee, initial encounter: Secondary | ICD-10-CM | POA: Insufficient documentation

## 2010-12-12 DIAGNOSIS — X58XXXA Exposure to other specified factors, initial encounter: Secondary | ICD-10-CM | POA: Insufficient documentation

## 2010-12-12 DIAGNOSIS — I252 Old myocardial infarction: Secondary | ICD-10-CM | POA: Insufficient documentation

## 2010-12-12 DIAGNOSIS — I509 Heart failure, unspecified: Secondary | ICD-10-CM | POA: Insufficient documentation

## 2010-12-12 DIAGNOSIS — I2510 Atherosclerotic heart disease of native coronary artery without angina pectoris: Secondary | ICD-10-CM | POA: Insufficient documentation

## 2010-12-19 LAB — DIFFERENTIAL
Basophils Absolute: 0
Eosinophils Absolute: 0.2
Eosinophils Absolute: 0.2
Eosinophils Relative: 2
Eosinophils Relative: 2
Lymphocytes Relative: 13
Lymphs Abs: 1.1
Monocytes Absolute: 0.8
Monocytes Relative: 16 — ABNORMAL HIGH
Neutrophils Relative %: 66

## 2010-12-19 LAB — BASIC METABOLIC PANEL
BUN: 12
BUN: 9
CO2: 24
CO2: 26
CO2: 29
Calcium: 8.8
Calcium: 9.2
Chloride: 100
Chloride: 106
Creatinine, Ser: 0.75
Creatinine, Ser: 0.83
GFR calc Af Amer: >60
GFR calc non Af Amer: >60
GFR calc non Af Amer: >60
Glucose, Bld: 82
Glucose, Bld: 89
Glucose, Bld: 93
Potassium: 3.5
Potassium: 3.8
Sodium: 140

## 2010-12-19 LAB — CBC
HCT: 24.2 — ABNORMAL LOW
HCT: 24.7 — ABNORMAL LOW
HCT: 28.4 — ABNORMAL LOW
HCT: 29.7 — ABNORMAL LOW
Hemoglobin: 10.2 — ABNORMAL LOW
Hemoglobin: 7.9 — CL
Hemoglobin: 8.2 — ABNORMAL LOW
MCHC: 33.1
MCHC: 33.1
MCHC: 33.3
MCHC: 33.5
MCV: 95.6
MCV: 95.7
MCV: 95.8
MCV: 96.2
MCV: 96.8
Platelets: 388
Platelets: 402 — ABNORMAL HIGH
RBC: 2.58 — ABNORMAL LOW
RBC: 3.1 — ABNORMAL LOW
RBC: 3.11 — ABNORMAL LOW
RDW: 17.4 — ABNORMAL HIGH
RDW: 17.6 — ABNORMAL HIGH
RDW: 20.4 — ABNORMAL HIGH
WBC: 7

## 2010-12-19 LAB — POCT I-STAT 3, ART BLOOD GAS (G3+)
Bicarbonate: 23.2
TCO2: 24
pCO2 arterial: 31.5 — ABNORMAL LOW
pH, Arterial: 7.475 — ABNORMAL HIGH
pO2, Arterial: 67 — ABNORMAL LOW

## 2010-12-19 LAB — I-STAT 8, (EC8 V) (CONVERTED LAB)
Chloride: 106
HCT: 40
Hemoglobin: 13.6
Potassium: 3.8
Sodium: 138
TCO2: 26

## 2010-12-19 LAB — TYPE AND SCREEN: ABO/RH(D): A POS

## 2010-12-19 LAB — B-NATRIURETIC PEPTIDE (CONVERTED LAB): Pro B Natriuretic peptide (BNP): 967 — ABNORMAL HIGH

## 2010-12-19 LAB — POCT CARDIAC MARKERS: CKMB, poc: 1

## 2010-12-19 LAB — POCT I-STAT CREATININE
Creatinine, Ser: 1.4 — ABNORMAL HIGH
Operator id: 196461

## 2010-12-19 LAB — APTT: aPTT: 40 — ABNORMAL HIGH

## 2010-12-20 LAB — CROSSMATCH
ABO/RH(D): A POS
Antibody Screen: NEGATIVE

## 2010-12-20 LAB — CBC
HCT: 27.9 — ABNORMAL LOW
MCHC: 33.1
Platelets: 383
RDW: 20 — ABNORMAL HIGH

## 2010-12-23 LAB — CBC
Hemoglobin: 8.9 — ABNORMAL LOW
RBC: 2.88 — ABNORMAL LOW

## 2010-12-23 LAB — CROSSMATCH

## 2010-12-24 LAB — BASIC METABOLIC PANEL
BUN: 18
BUN: 18
CO2: 30
CO2: 31
Calcium: 9.6
Calcium: 9.6
GFR calc non Af Amer: 48 — ABNORMAL LOW
GFR calc non Af Amer: 49 — ABNORMAL LOW
Glucose, Bld: 100 — ABNORMAL HIGH
Glucose, Bld: 234 — ABNORMAL HIGH
Potassium: 2.8 — ABNORMAL LOW
Potassium: 3.3 — ABNORMAL LOW
Sodium: 129 — ABNORMAL LOW
Sodium: 131 — ABNORMAL LOW

## 2010-12-24 LAB — CBC
HCT: 29.8 — ABNORMAL LOW
Hemoglobin: 9.8 — ABNORMAL LOW
MCHC: 32.9
MCHC: 33.7
MCV: 94.3
Platelets: 328
RDW: 16.3 — ABNORMAL HIGH
RDW: 16.9 — ABNORMAL HIGH

## 2010-12-24 LAB — APTT: aPTT: 31

## 2010-12-26 LAB — CROSSMATCH: ABO/RH(D): A POS

## 2010-12-27 LAB — CROSSMATCH: Antibody Screen: NEGATIVE

## 2010-12-27 LAB — BASIC METABOLIC PANEL
BUN: 10
Creatinine, Ser: 0.79
GFR calc non Af Amer: >60
Glucose, Bld: 93

## 2010-12-27 LAB — CBC
HCT: 27.1 — ABNORMAL LOW
HCT: 27 — ABNORMAL LOW
HCT: 27.7 — ABNORMAL LOW
Hemoglobin: 8.8 — ABNORMAL LOW
Hemoglobin: 9 — ABNORMAL LOW
MCV: 89.8
MCV: 88.2
Platelets: 369
Platelets: 385
Platelets: 453 — ABNORMAL HIGH
RBC: 2.67 — ABNORMAL LOW
RBC: 3.02 — ABNORMAL LOW
RDW: 17.5 — ABNORMAL HIGH
RDW: 18.2 — ABNORMAL HIGH
RDW: 17.8 — ABNORMAL HIGH
RDW: 18 — ABNORMAL HIGH
WBC: 10.1
WBC: 9.3
WBC: 10.3

## 2010-12-27 LAB — URINALYSIS, ROUTINE W REFLEX MICROSCOPIC
Glucose, UA: NEGATIVE
Hgb urine dipstick: NEGATIVE
Ketones, ur: NEGATIVE
Protein, ur: NEGATIVE

## 2010-12-27 LAB — URINE CULTURE: Special Requests: NEGATIVE

## 2010-12-27 LAB — COMPREHENSIVE METABOLIC PANEL
ALT: 116 — ABNORMAL HIGH
AST: 92 — ABNORMAL HIGH
Albumin: 2.6 — ABNORMAL LOW
Albumin: 2.9 — ABNORMAL LOW
Alkaline Phosphatase: 250 — ABNORMAL HIGH
Alkaline Phosphatase: 277 — ABNORMAL HIGH
Alkaline Phosphatase: 325 — ABNORMAL HIGH
BUN: 12
BUN: 16
CO2: 25
CO2: 26
Calcium: 8.6
Chloride: 96
Chloride: 94 — ABNORMAL LOW
Chloride: 96
Creatinine, Ser: 0.86
GFR calc Af Amer: >60
GFR calc non Af Amer: >60
GFR calc non Af Amer: >60
Glucose, Bld: 101 — ABNORMAL HIGH
Glucose, Bld: 110 — ABNORMAL HIGH
Potassium: 3.5
Potassium: 3.9
Potassium: 4.1
Sodium: 128 — ABNORMAL LOW
Total Bilirubin: 1.3 — ABNORMAL HIGH
Total Bilirubin: 1
Total Bilirubin: 2.1 — ABNORMAL HIGH
Total Protein: 6.5
Total Protein: 6.9
Total Protein: 6.6

## 2010-12-27 LAB — OSMOLALITY, URINE: Osmolality, Ur: 392

## 2010-12-27 LAB — T3, FREE: T3, Free: 2 — ABNORMAL LOW (ref 2.3–4.2)

## 2010-12-27 LAB — CULTURE, BLOOD (ROUTINE X 2)
Culture: NO GROWTH
Culture: NO GROWTH

## 2010-12-27 LAB — DIFFERENTIAL
Basophils Relative: 0
Eosinophils Absolute: 0
Eosinophils Absolute: 0
Eosinophils Relative: 0
Lymphocytes Relative: 6 — ABNORMAL LOW
Lymphs Abs: 0.6 — ABNORMAL LOW
Monocytes Absolute: 1.5 — ABNORMAL HIGH
Monocytes Absolute: 1.5 — ABNORMAL HIGH
Monocytes Relative: 15 — ABNORMAL HIGH

## 2010-12-27 LAB — URINE MICROSCOPIC-ADD ON

## 2010-12-27 LAB — TSH: TSH: 5.19 — ABNORMAL HIGH

## 2010-12-27 LAB — APTT: aPTT: 29

## 2010-12-27 LAB — HEMOGLOBIN AND HEMATOCRIT, BLOOD
HCT: 28.5 — ABNORMAL LOW
Hemoglobin: 9.4 — ABNORMAL LOW

## 2010-12-27 LAB — CARDIAC PANEL(CRET KIN+CKTOT+MB+TROPI)
CK, MB: 1.5
Relative Index: INVALID
Total CK: 65

## 2010-12-27 LAB — CORTISOL-AM, BLOOD: Cortisol - AM: 17.9

## 2010-12-27 LAB — B-NATRIURETIC PEPTIDE (CONVERTED LAB): Pro B Natriuretic peptide (BNP): 2115 — ABNORMAL HIGH

## 2011-01-03 LAB — CBC
HCT: 26.7 — ABNORMAL LOW
HCT: 27.3 — ABNORMAL LOW
HCT: 27.6 — ABNORMAL LOW
Hemoglobin: 10.2 — ABNORMAL LOW
Hemoglobin: 7.9 — CL
Hemoglobin: 8.8 — ABNORMAL LOW
Hemoglobin: 9.4 — ABNORMAL LOW
Hemoglobin: 9.8 — ABNORMAL LOW
MCHC: 32.1
MCHC: 32.8
MCHC: 32.9
MCHC: 34.2
MCHC: 34.8
MCV: 88
MCV: 91
MCV: 93.9
Platelets: 443 — ABNORMAL HIGH
Platelets: 480 — ABNORMAL HIGH
Platelets: 509 — ABNORMAL HIGH
RBC: 2.71 — ABNORMAL LOW
RBC: 2.94 — ABNORMAL LOW
RBC: 3.01 — ABNORMAL LOW
RBC: 3.01 — ABNORMAL LOW
RBC: 3.2 — ABNORMAL LOW
RBC: 3.32 — ABNORMAL LOW
RBC: 3.32 — ABNORMAL LOW
RDW: 17.7 — ABNORMAL HIGH
RDW: 17.7 — ABNORMAL HIGH
RDW: 17.8 — ABNORMAL HIGH
RDW: 18.9 — ABNORMAL HIGH
WBC: 5.8
WBC: 6.4
WBC: 6.9
WBC: 7.2
WBC: 7.3

## 2011-01-03 LAB — CORTISOL: Cortisol, Plasma: 4.5

## 2011-01-03 LAB — COMPREHENSIVE METABOLIC PANEL
ALT: 29
AST: 40 — ABNORMAL HIGH
Alkaline Phosphatase: 81
Calcium: 8.8
GFR calc Af Amer: >60
Glucose, Bld: 94
Potassium: 4
Sodium: 132 — ABNORMAL LOW
Total Protein: 5.8 — ABNORMAL LOW

## 2011-01-03 LAB — CROSSMATCH: ABO/RH(D): A POS

## 2011-01-03 LAB — VITAMIN B12: Vitamin B-12: 892 (ref 211–911)

## 2011-01-03 LAB — RETICULOCYTES: Retic Ct Pct: 4.2 — ABNORMAL HIGH

## 2011-01-03 LAB — BASIC METABOLIC PANEL
BUN: 9
BUN: 9
CO2: 26
Calcium: 8.5
Calcium: 8.6
Calcium: 8.8
Calcium: 9
Chloride: 106
Creatinine, Ser: 0.79
Creatinine, Ser: 0.8
Creatinine, Ser: 0.87
Creatinine, Ser: 0.91
GFR calc Af Amer: >60
GFR calc Af Amer: >60
GFR calc Af Amer: >60
GFR calc Af Amer: >60
GFR calc non Af Amer: >60
GFR calc non Af Amer: >60
GFR calc non Af Amer: >60
Glucose, Bld: 86
Glucose, Bld: 98
Potassium: 3.5
Potassium: 3.8
Potassium: 4
Sodium: 138

## 2011-01-03 LAB — IRON AND TIBC: UIBC: 245

## 2011-01-03 LAB — TYPE AND SCREEN
ABO/RH(D): A POS
Antibody Screen: NEGATIVE

## 2011-01-03 LAB — CA 125: CA 125: 136.3 — ABNORMAL HIGH

## 2011-01-03 LAB — B-NATRIURETIC PEPTIDE (CONVERTED LAB)
Pro B Natriuretic peptide (BNP): 1505 — ABNORMAL HIGH
Pro B Natriuretic peptide (BNP): 1546 — ABNORMAL HIGH
Pro B Natriuretic peptide (BNP): 1546 — ABNORMAL HIGH

## 2011-01-03 LAB — FOLATE: Folate: >20

## 2011-01-03 LAB — PROTIME-INR
INR: 1
Prothrombin Time: 13.6

## 2011-01-03 LAB — FERRITIN: Ferritin: 35 (ref 10–291)

## 2011-01-06 LAB — HEPATIC FUNCTION PANEL
ALT: 13
Albumin: 2.8 — ABNORMAL LOW
Alkaline Phosphatase: 81
Total Bilirubin: 0.4
Total Protein: 6

## 2011-01-06 LAB — CBC
Hemoglobin: 6.7 — CL
MCHC: 32.2
MCV: 90.8
RBC: 2.3 — ABNORMAL LOW
WBC: 11.9 — ABNORMAL HIGH

## 2011-01-06 LAB — CROSSMATCH

## 2011-01-07 LAB — DIFFERENTIAL
Basophils Absolute: 0
Basophils Relative: 0
Eosinophils Relative: 1
Lymphocytes Relative: 12
Lymphs Abs: 0.9
Monocytes Absolute: 1.2 — ABNORMAL HIGH
Monocytes Relative: 11
Neutro Abs: 12.3 — ABNORMAL HIGH
Neutro Abs: 7.1
Neutrophils Relative %: 76

## 2011-01-07 LAB — COMPREHENSIVE METABOLIC PANEL
ALT: 13
AST: 12
CO2: 27
Calcium: 8.8
Creatinine, Ser: 0.58
GFR calc Af Amer: >60
GFR calc non Af Amer: >60
Sodium: 135
Total Protein: 6.6

## 2011-01-07 LAB — CBC
HCT: 29.4 — ABNORMAL LOW
HCT: 30 — ABNORMAL LOW
HCT: 31 — ABNORMAL LOW
Hemoglobin: 9.9 — ABNORMAL LOW
MCHC: 33.3
MCHC: 33.5
MCHC: 33.6
MCV: 90.4
MCV: 90.6
MCV: 91.4
Platelets: 438 — ABNORMAL HIGH
Platelets: 447 — ABNORMAL HIGH
Platelets: 474 — ABNORMAL HIGH
Platelets: 503 — ABNORMAL HIGH
RBC: 3.15 — ABNORMAL LOW
RBC: 3.25 — ABNORMAL LOW
RDW: 20.4 — ABNORMAL HIGH
RDW: 20.9 — ABNORMAL HIGH
RDW: 21.4 — ABNORMAL HIGH
WBC: 8.4
WBC: 9.5

## 2011-01-07 LAB — URINE CULTURE: Colony Count: >=100000

## 2011-01-07 LAB — CROSSMATCH

## 2011-01-07 LAB — I-STAT 8, (EC8 V) (CONVERTED LAB)
BUN: 6
Bicarbonate: 25.6 — ABNORMAL HIGH
Glucose, Bld: 98
Sodium: 136
TCO2: 27
pCO2, Ven: 44.5 — ABNORMAL LOW
pH, Ven: 7.368 — ABNORMAL HIGH

## 2011-01-07 LAB — URINALYSIS, ROUTINE W REFLEX MICROSCOPIC
Bilirubin Urine: NEGATIVE
Glucose, UA: NEGATIVE
Hgb urine dipstick: NEGATIVE
Ketones, ur: NEGATIVE
Protein, ur: NEGATIVE
Urobilinogen, UA: 0.2

## 2011-01-07 LAB — BASIC METABOLIC PANEL
BUN: 11
BUN: 12
CO2: 26
CO2: 28
Calcium: 9.1
Calcium: 9.1
Chloride: 101
Chloride: 99
Creatinine, Ser: 0.62
Creatinine, Ser: 0.66
GFR calc Af Amer: >60
GFR calc Af Amer: >60
GFR calc non Af Amer: >60
GFR calc non Af Amer: >60
GFR calc non Af Amer: >60
Glucose, Bld: 104 — ABNORMAL HIGH
Glucose, Bld: 121 — ABNORMAL HIGH
Potassium: 3.6
Potassium: 3.9
Potassium: 4.1
Sodium: 136
Sodium: 137
Sodium: 138

## 2011-01-07 LAB — HEPATIC FUNCTION PANEL
ALT: 15
Bilirubin, Direct: 0.1
Indirect Bilirubin: 0.4
Total Bilirubin: 0.5

## 2011-01-07 LAB — B-NATRIURETIC PEPTIDE (CONVERTED LAB): Pro B Natriuretic peptide (BNP): 1954 — ABNORMAL HIGH

## 2011-01-07 LAB — CULTURE, BLOOD (ROUTINE X 2)
Culture: NO GROWTH
Culture: NO GROWTH

## 2011-01-07 LAB — CARDIAC PANEL(CRET KIN+CKTOT+MB+TROPI)
CK, MB: 1.1
CK, MB: 1.4
Relative Index: INVALID
Relative Index: INVALID
Total CK: 30
Total CK: 33
Troponin I: 0.04

## 2011-01-07 LAB — CK TOTAL AND CKMB (NOT AT ARMC)
Relative Index: INVALID
Total CK: 30

## 2011-01-07 LAB — IRON AND TIBC
Iron: 23 — ABNORMAL LOW
Iron: 26 — ABNORMAL LOW
Saturation Ratios: 10 — ABNORMAL LOW
TIBC: 264

## 2011-01-07 LAB — TROPONIN I: Troponin I: 0.03

## 2011-01-07 LAB — URINE MICROSCOPIC-ADD ON

## 2011-01-07 LAB — POCT CARDIAC MARKERS
CKMB, poc: <1 — ABNORMAL LOW
Myoglobin, poc: 37.7
Operator id: 272551

## 2011-01-27 ENCOUNTER — Encounter: Payer: Medicare Other | Attending: Neurosurgery | Admitting: Neurosurgery

## 2011-01-27 DIAGNOSIS — M25579 Pain in unspecified ankle and joints of unspecified foot: Secondary | ICD-10-CM

## 2011-01-27 DIAGNOSIS — M19079 Primary osteoarthritis, unspecified ankle and foot: Secondary | ICD-10-CM | POA: Insufficient documentation

## 2011-01-27 DIAGNOSIS — M961 Postlaminectomy syndrome, not elsewhere classified: Secondary | ICD-10-CM | POA: Insufficient documentation

## 2011-01-27 DIAGNOSIS — K089 Disorder of teeth and supporting structures, unspecified: Secondary | ICD-10-CM | POA: Insufficient documentation

## 2011-01-27 DIAGNOSIS — I428 Other cardiomyopathies: Secondary | ICD-10-CM | POA: Insufficient documentation

## 2011-01-27 DIAGNOSIS — G500 Trigeminal neuralgia: Secondary | ICD-10-CM | POA: Insufficient documentation

## 2011-01-28 NOTE — Assessment & Plan Note (Signed)
HISTORY:  Patient Dr. Riley Kill, has multiple pain complaints as well as a history of right-sided trigeminal neuralgia, status post laminectomy syndrome.  She reports no change in her pain.  She has had some tooth pain where she had a tooth extracted.  Average pain is 5-8.  General activity level 6-9.  Pain same 24 hours a day.  Bending tends to aggravate.  Rest and medication helps.  She walks independently.  She is on disability.  REVIEW OF SYSTEMS:  Notable for difficulties described above as well as some weight loss,  depression, anxiety.  No suicidal thoughts or aberrant behaviors.  Last UDS pill count is correct.  PAST MEDICAL HISTORY, SOCIAL HISTORY, AND FAMILY HISTORY:  Unchanged.  PHYSICAL EXAMINATION:  VITAL SIGNS:  Blood pressure 122/83, pulse 110, respirations 18, and O2 sats 98 on room air. MUSCULOSKELETAL:  Motor strength and sensation intact.  She is somewhat diminished on the left side of her face.  ASSESSMENT: 1. Right-sided trigeminal neuralgia 2. Post-laminectomy syndrome. 3. Osteoarthritis of the feet. 4. History of cardiomyopathy.  PLAN: 1. Refill hydrocodone 10/325 one p.o. q.6 hours p.r.n., 120 with no     refill. 2. Fentanyl 25 mcg 1 transdermally every 72 hours, 10 with no refills 3. She did ask about increasing her pain medicine.  She will speak     with Dr. Riley Kill at the next appointment.  She will follow up in 2     months.  Her questions were encouraged and answered.     Telisha Zawadzki L. Blima Dessert     [name], M.D. Electronically Signed   RLW/MedQ D:  01/27/2011 14:04:27  T:  01/28/2011 00:55:39  Job #:  [id number]

## 2011-01-30 ENCOUNTER — Encounter: Payer: Medicare Other | Admitting: *Deleted

## 2011-02-04 ENCOUNTER — Encounter: Payer: Self-pay | Admitting: *Deleted

## 2011-02-24 ENCOUNTER — Ambulatory Visit: Payer: Medicare Other | Admitting: Physical Medicine & Rehabilitation

## 2011-03-18 ENCOUNTER — Encounter: Payer: Medicare Other | Attending: Physical Medicine & Rehabilitation | Admitting: Physical Medicine & Rehabilitation

## 2011-03-18 DIAGNOSIS — M19079 Primary osteoarthritis, unspecified ankle and foot: Secondary | ICD-10-CM

## 2011-03-18 DIAGNOSIS — M961 Postlaminectomy syndrome, not elsewhere classified: Secondary | ICD-10-CM | POA: Insufficient documentation

## 2011-03-18 DIAGNOSIS — G90529 Complex regional pain syndrome I of unspecified lower limb: Secondary | ICD-10-CM

## 2011-03-18 DIAGNOSIS — G8929 Other chronic pain: Secondary | ICD-10-CM | POA: Insufficient documentation

## 2011-03-18 DIAGNOSIS — G500 Trigeminal neuralgia: Secondary | ICD-10-CM

## 2011-03-18 DIAGNOSIS — I428 Other cardiomyopathies: Secondary | ICD-10-CM | POA: Insufficient documentation

## 2011-03-18 NOTE — Assessment & Plan Note (Signed)
HISTORY:  [name] is back regarding her chronic pain issues.  She states that over the last few months her facial pain is worsening.  She reports a heavy sensitivity to the cold wind and any type of touch.  She is using her hydrocodone and fentanyl but really do not seem to be helping with that at this point.  She requested more pills with my nurse practitioner, previously.  She sleeps fairly well.  Still has some anxiety at times.  She tries to remain involved with her family.  REVIEW OF SYSTEMS:  Notable for the above.  She does report impaired taste and smell.  She has a hard time chewing or talking excessively because of her pain.  SOCIAL HISTORY:  Noted above.  PHYSICAL EXAMINATION:  VITAL SIGNS:  Blood pressure is 93/56, pulse 88, respiratory rate 16,  she is satting 97% on room air. GENERAL:  The patient is pleasant and alert.  She is a bit anxious at times.  She has lost weight. HEART:  Regular. CHEST:  Clear. HEENT:  Did not touch her face as she did not want to allow me to do this due to sensitivity.  She had no focal cranial nerve abnormalities that I could see with vision, smell, or hearing, extraocular eye movements, tongue movement, etc.,  Strength is grossly 4-5/5 in another affected limbs.  Gait is normal.  ASSESSMENT: 1. Right-sided trigeminal neuralgia. 2. Post laminectomy syndrome. 3. Osteoarthritis of the feet. 4. History of cardiomyopathy.  PLAN: 1. We will trial Lyrica 50 mg starting only at nighttime and     increasing every 5 days to 50 mg t.i.d.  We discussed the potential     effects and side effects.  The patient will observe. 2. Refilled fentanyl patch 25 mcg, #10. 3. We will change hydrocodone to oxycodone 10 mg 1 q.6 hours., p.r.n.     #100. 4. She will see my nurse practitioner about a month and me in 2-3     months' time.  Potentially work on titrating up her anticonvulsants     to tolerance level and affect levels.     [name],  M.D. Electronically Signed    ZTS/MedQ D:  03/18/2011 13:20:24  T:  03/18/2011 19:48:42  Job #:  [id number]

## 2011-03-21 ENCOUNTER — Other Ambulatory Visit: Payer: Self-pay

## 2011-03-21 ENCOUNTER — Emergency Department (HOSPITAL_COMMUNITY): Payer: Medicare Other

## 2011-03-21 ENCOUNTER — Inpatient Hospital Stay (HOSPITAL_COMMUNITY)
Admission: AD | Admit: 2011-03-21 | Discharge: 2011-03-24 | DRG: 194 | Disposition: A | Discharge status: Home / Self Care | Payer: Medicare Other | Attending: Internal Medicine | Admitting: Internal Medicine

## 2011-03-21 ENCOUNTER — Encounter (HOSPITAL_COMMUNITY): Payer: Self-pay

## 2011-03-21 DIAGNOSIS — D649 Anemia, unspecified: Secondary | ICD-10-CM | POA: Diagnosis present

## 2011-03-21 DIAGNOSIS — G500 Trigeminal neuralgia: Secondary | ICD-10-CM | POA: Insufficient documentation

## 2011-03-21 DIAGNOSIS — R197 Diarrhea, unspecified: Secondary | ICD-10-CM | POA: Diagnosis not present

## 2011-03-21 DIAGNOSIS — M1990 Unspecified osteoarthritis, unspecified site: Secondary | ICD-10-CM | POA: Insufficient documentation

## 2011-03-21 DIAGNOSIS — Z96659 Presence of unspecified artificial knee joint: Secondary | ICD-10-CM

## 2011-03-21 DIAGNOSIS — E785 Hyperlipidemia, unspecified: Secondary | ICD-10-CM | POA: Diagnosis present

## 2011-03-21 DIAGNOSIS — E876 Hypokalemia: Secondary | ICD-10-CM | POA: Diagnosis not present

## 2011-03-21 DIAGNOSIS — I2510 Atherosclerotic heart disease of native coronary artery without angina pectoris: Secondary | ICD-10-CM | POA: Diagnosis present

## 2011-03-21 DIAGNOSIS — J189 Pneumonia, unspecified organism: Principal | ICD-10-CM | POA: Diagnosis present

## 2011-03-21 DIAGNOSIS — T3695XA Adverse effect of unspecified systemic antibiotic, initial encounter: Secondary | ICD-10-CM | POA: Diagnosis not present

## 2011-03-21 DIAGNOSIS — E871 Hypo-osmolality and hyponatremia: Secondary | ICD-10-CM | POA: Diagnosis present

## 2011-03-21 DIAGNOSIS — Z95810 Presence of automatic (implantable) cardiac defibrillator: Secondary | ICD-10-CM

## 2011-03-21 HISTORY — DX: Adverse effect of unspecified anesthetic, initial encounter: T41.45XA

## 2011-03-21 HISTORY — DX: Malignant (primary) neoplasm, unspecified: C80.1

## 2011-03-21 HISTORY — DX: Other complications of anesthesia, initial encounter: T88.59XA

## 2011-03-21 LAB — CBC
HCT: 27.2 % — ABNORMAL LOW (ref 36.0–46.0)
Hemoglobin: 8.2 g/dL — ABNORMAL LOW (ref 12.0–15.0)
MCH: 25.7 pg — ABNORMAL LOW (ref 26.0–34.0)
MCHC: 32.2 g/dL (ref 30.0–36.0)
MCV: 80.2 fL (ref 78.0–100.0)
Platelets: 279 10*3/uL (ref 150–400)
RDW: 15.4 % (ref 11.5–15.5)
WBC: 7 10*3/uL (ref 4.0–10.5)
WBC: 7.5 10*3/uL (ref 4.0–10.5)

## 2011-03-21 LAB — CARDIAC PANEL(CRET KIN+CKTOT+MB+TROPI): Total CK: 131 U/L (ref 7–177)

## 2011-03-21 LAB — DIFFERENTIAL
Basophils Absolute: 0 10*3/uL (ref 0.0–0.1)
Eosinophils Absolute: 0 10*3/uL (ref 0.0–0.7)
Eosinophils Relative: 0 % (ref 0–5)
Lymphocytes Relative: 10 % — ABNORMAL LOW (ref 12–46)
Monocytes Absolute: 1.7 10*3/uL — ABNORMAL HIGH (ref 0.1–1.0)

## 2011-03-21 LAB — BASIC METABOLIC PANEL
CO2: 27 mEq/L (ref 19–32)
Calcium: 9.9 mg/dL (ref 8.4–10.5)
Creatinine, Ser: 0.5 mg/dL (ref 0.50–1.10)
GFR calc non Af Amer: >90 mL/min (ref >90)
Glucose, Bld: 109 mg/dL — ABNORMAL HIGH (ref 70–99)
Sodium: 132 mEq/L — ABNORMAL LOW (ref 135–145)

## 2011-03-21 LAB — CREATININE, SERUM
Creatinine, Ser: 0.5 mg/dL (ref 0.50–1.10)
GFR calc non Af Amer: >90 mL/min (ref >90)

## 2011-03-21 MED ORDER — NORTRIPTYLINE HCL 25 MG PO CAPS
100.0000 mg | ORAL_CAPSULE | Freq: Every day | ORAL | Status: DC
  Administered 2011-03-22 – 2011-03-23 (×3): 100 mg via ORAL
  Filled 2011-03-21 (×4): qty 4

## 2011-03-21 MED ORDER — ASPIRIN EC 325 MG PO TBEC
325.0000 mg | DELAYED_RELEASE_TABLET | Freq: Every day | ORAL | Status: DC
  Administered 2011-03-22 – 2011-03-24 (×3): 325 mg via ORAL
  Filled 2011-03-21 (×3): qty 1

## 2011-03-21 MED ORDER — FERROUS SULFATE 325 (65 FE) MG PO TABS
325.0000 mg | ORAL_TABLET | Freq: Two times a day (BID) | ORAL | Status: DC
  Administered 2011-03-22 – 2011-03-24 (×5): 325 mg via ORAL
  Filled 2011-03-21 (×7): qty 1

## 2011-03-21 MED ORDER — ENOXAPARIN SODIUM 40 MG/0.4ML ~~LOC~~ SOLN
40.0000 mg | SUBCUTANEOUS | Status: DC
  Administered 2011-03-21 – 2011-03-23 (×3): 40 mg via SUBCUTANEOUS
  Filled 2011-03-21 (×4): qty 0.4

## 2011-03-21 MED ORDER — RAMIPRIL 2.5 MG PO CAPS
2.5000 mg | ORAL_CAPSULE | Freq: Every day | ORAL | Status: DC
  Administered 2011-03-21: 2.5 mg via ORAL
  Filled 2011-03-21 (×2): qty 1

## 2011-03-21 MED ORDER — OXYCODONE HCL 5 MG PO TABS
10.0000 mg | ORAL_TABLET | Freq: Four times a day (QID) | ORAL | Status: DC | PRN
  Administered 2011-03-22 – 2011-03-23 (×5): 10 mg via ORAL
  Filled 2011-03-21 (×5): qty 2

## 2011-03-21 MED ORDER — FLEET ENEMA 7-19 GM/118ML RE ENEM
1.0000 | ENEMA | Freq: Once | RECTAL | Status: AC | PRN
  Filled 2011-03-21: qty 1

## 2011-03-21 MED ORDER — SPIRONOLACTONE 25 MG PO TABS
25.0000 mg | ORAL_TABLET | Freq: Every day | ORAL | Status: DC
  Administered 2011-03-21: 25 mg via ORAL
  Filled 2011-03-21 (×2): qty 1

## 2011-03-21 MED ORDER — IPRATROPIUM BROMIDE 0.02 % IN SOLN
0.5000 mg | Freq: Once | RESPIRATORY_TRACT | Status: AC
  Administered 2011-03-21: 0.5 mg via RESPIRATORY_TRACT
  Filled 2011-03-21: qty 2.5

## 2011-03-21 MED ORDER — GABAPENTIN 400 MG PO CAPS
800.0000 mg | ORAL_CAPSULE | Freq: Four times a day (QID) | ORAL | Status: DC | PRN
  Filled 2011-03-21: qty 2

## 2011-03-21 MED ORDER — ALBUTEROL SULFATE (5 MG/ML) 0.5% IN NEBU
5.0000 mg | INHALATION_SOLUTION | Freq: Once | RESPIRATORY_TRACT | Status: AC
  Administered 2011-03-21: 5 mg via RESPIRATORY_TRACT
  Filled 2011-03-21: qty 1

## 2011-03-21 MED ORDER — SODIUM CHLORIDE 0.9 % IV SOLN: 250.0000 mL | INTRAVENOUS | Status: DC | PRN

## 2011-03-21 MED ORDER — ACETAMINOPHEN 325 MG PO TABS
650.0000 mg | ORAL_TABLET | Freq: Four times a day (QID) | ORAL | Status: DC | PRN
  Administered 2011-03-22: 650 mg via ORAL

## 2011-03-21 MED ORDER — ALBUTEROL SULFATE (5 MG/ML) 0.5% IN NEBU: 2.5000 mg | INHALATION_SOLUTION | Freq: Three times a day (TID) | RESPIRATORY_TRACT | Status: DC

## 2011-03-21 MED ORDER — DOCUSATE SODIUM 100 MG PO CAPS
100.0000 mg | ORAL_CAPSULE | Freq: Two times a day (BID) | ORAL | Status: DC
  Administered 2011-03-21 – 2011-03-23 (×3): 100 mg via ORAL
  Filled 2011-03-21 (×7): qty 1

## 2011-03-21 MED ORDER — DEXTROSE 5 % IV SOLN
1.0000 g | Freq: Once | INTRAVENOUS | Status: AC
  Administered 2011-03-21: 1 g via INTRAVENOUS
  Filled 2011-03-21: qty 10

## 2011-03-21 MED ORDER — ZOLPIDEM TARTRATE 5 MG PO TABS: 10.0000 mg | ORAL_TABLET | Freq: Every evening | ORAL | Status: DC | PRN

## 2011-03-21 MED ORDER — CARVEDILOL 6.25 MG PO TABS
6.2500 mg | ORAL_TABLET | Freq: Two times a day (BID) | ORAL | Status: DC
  Filled 2011-03-21: qty 1

## 2011-03-21 MED ORDER — BENZONATATE 100 MG PO CAPS
100.0000 mg | ORAL_CAPSULE | Freq: Three times a day (TID) | ORAL | Status: DC
  Administered 2011-03-21 – 2011-03-23 (×7): 100 mg via ORAL
  Filled 2011-03-21 (×10): qty 1

## 2011-03-21 MED ORDER — FUROSEMIDE 80 MG PO TABS: 120.0000 mg | ORAL_TABLET | Freq: Two times a day (BID) | ORAL | Status: DC

## 2011-03-21 MED ORDER — GABAPENTIN 800 MG PO TABS
800.0000 mg | ORAL_TABLET | Freq: Four times a day (QID) | ORAL | Status: DC | PRN
  Filled 2011-03-21: qty 1

## 2011-03-21 MED ORDER — VENLAFAXINE HCL 75 MG PO TABS
75.0000 mg | ORAL_TABLET | Freq: Two times a day (BID) | ORAL | Status: DC
  Administered 2011-03-21 – 2011-03-23 (×5): 75 mg via ORAL
  Filled 2011-03-21 (×7): qty 1

## 2011-03-21 MED ORDER — PANTOPRAZOLE SODIUM 40 MG PO TBEC
80.0000 mg | DELAYED_RELEASE_TABLET | Freq: Every day | ORAL | Status: DC
  Administered 2011-03-21 – 2011-03-23 (×3): 80 mg via ORAL
  Filled 2011-03-21 (×3): qty 1

## 2011-03-21 MED ORDER — SENNOSIDES-DOCUSATE SODIUM 8.6-50 MG PO TABS
1.0000 | ORAL_TABLET | Freq: Every evening | ORAL | Status: DC | PRN
  Filled 2011-03-21: qty 1

## 2011-03-21 MED ORDER — SODIUM CHLORIDE 0.9 % IJ SOLN
3.0000 mL | Freq: Two times a day (BID) | INTRAMUSCULAR | Status: DC
  Administered 2011-03-22 – 2011-03-23 (×5): 3 mL via INTRAVENOUS

## 2011-03-21 MED ORDER — SODIUM CHLORIDE 0.9 % IJ SOLN: 3.0000 mL | INTRAMUSCULAR | Status: DC | PRN

## 2011-03-21 MED ORDER — CARVEDILOL 6.25 MG PO TABS
6.2500 mg | ORAL_TABLET | Freq: Two times a day (BID) | ORAL | Status: DC
  Administered 2011-03-21 – 2011-03-24 (×6): 6.25 mg via ORAL
  Filled 2011-03-21 (×8): qty 1

## 2011-03-21 MED ORDER — DEXTROSE 5 % IV SOLN
500.0000 mg | INTRAVENOUS | Status: DC
  Administered 2011-03-21 – 2011-03-22 (×2): 500 mg via INTRAVENOUS
  Filled 2011-03-21 (×3): qty 500

## 2011-03-21 MED ORDER — IPRATROPIUM BROMIDE 0.02 % IN SOLN
0.5000 mg | Freq: Four times a day (QID) | RESPIRATORY_TRACT | Status: DC
  Administered 2011-03-22: 0.5 mg via RESPIRATORY_TRACT
  Filled 2011-03-21 (×2): qty 2.5

## 2011-03-21 MED ORDER — IPRATROPIUM BROMIDE 0.02 % IN SOLN: 0.5000 mg | RESPIRATORY_TRACT | Status: DC | PRN

## 2011-03-21 MED ORDER — SODIUM CHLORIDE 0.9 % IV SOLN: INTRAVENOUS | Status: DC

## 2011-03-21 MED ORDER — ALUM & MAG HYDROXIDE-SIMETH 200-200-20 MG/5ML PO SUSP: 30.0000 mL | Freq: Four times a day (QID) | ORAL | Status: DC | PRN

## 2011-03-21 MED ORDER — SODIUM CHLORIDE 0.9 % IV BOLUS (SEPSIS)
1000.0000 mL | Freq: Once | INTRAVENOUS | Status: AC
  Administered 2011-03-21: 1000 mL via INTRAVENOUS

## 2011-03-21 MED ORDER — AZITHROMYCIN 250 MG PO TABS
500.0000 mg | ORAL_TABLET | Freq: Once | ORAL | Status: AC
  Administered 2011-03-21: 500 mg via ORAL
  Filled 2011-03-21: qty 2

## 2011-03-21 MED ORDER — CYCLOBENZAPRINE HCL 5 MG PO TABS
5.0000 mg | ORAL_TABLET | Freq: Three times a day (TID) | ORAL | Status: DC | PRN
  Filled 2011-03-21: qty 1

## 2011-03-21 MED ORDER — OXYCODONE-ACETAMINOPHEN 5-325 MG PO TABS
2.0000 | ORAL_TABLET | Freq: Once | ORAL | Status: AC
  Administered 2011-03-21: 2 via ORAL
  Filled 2011-03-21: qty 2

## 2011-03-21 MED ORDER — NITROGLYCERIN 0.4 MG SL SUBL: 0.4000 mg | SUBLINGUAL_TABLET | SUBLINGUAL | Status: DC | PRN

## 2011-03-21 MED ORDER — ONDANSETRON HCL 4 MG/2ML IJ SOLN
4.0000 mg | Freq: Four times a day (QID) | INTRAMUSCULAR | Status: DC | PRN
  Administered 2011-03-22: 4 mg via INTRAVENOUS
  Filled 2011-03-21: qty 2

## 2011-03-21 MED ORDER — ACETAMINOPHEN ER 650 MG PO TBCR: 650.0000 mg | EXTENDED_RELEASE_TABLET | Freq: Three times a day (TID) | ORAL | Status: DC | PRN

## 2011-03-21 MED ORDER — LEVALBUTEROL HCL 0.63 MG/3ML IN NEBU
0.6300 mg | INHALATION_SOLUTION | Freq: Four times a day (QID) | RESPIRATORY_TRACT | Status: DC
  Administered 2011-03-22: 0.63 mg via RESPIRATORY_TRACT
  Filled 2011-03-21 (×7): qty 3

## 2011-03-21 MED ORDER — ZOLPIDEM TARTRATE 5 MG PO TABS
5.0000 mg | ORAL_TABLET | Freq: Every evening | ORAL | Status: DC | PRN
  Administered 2011-03-21 – 2011-03-23 (×3): 5 mg via ORAL
  Filled 2011-03-21 (×3): qty 1

## 2011-03-21 MED ORDER — ONDANSETRON HCL 4 MG PO TABS: 4.0000 mg | ORAL_TABLET | Freq: Four times a day (QID) | ORAL | Status: DC | PRN

## 2011-03-21 MED ORDER — ACETAMINOPHEN 325 MG PO TABS
650.0000 mg | ORAL_TABLET | Freq: Three times a day (TID) | ORAL | Status: DC | PRN
  Filled 2011-03-21: qty 2

## 2011-03-21 MED ORDER — SODIUM CHLORIDE 0.9 % IV SOLN
Freq: Once | INTRAVENOUS | Status: AC
  Administered 2011-03-21: 18:00:00 via INTRAVENOUS

## 2011-03-21 MED ORDER — LEVOTHYROXINE SODIUM 50 MCG PO TABS
50.0000 ug | ORAL_TABLET | Freq: Every day | ORAL | Status: DC
  Administered 2011-03-21 – 2011-03-23 (×3): 50 ug via ORAL
  Filled 2011-03-21 (×4): qty 1

## 2011-03-21 MED ORDER — SIMVASTATIN 10 MG PO TABS
10.0000 mg | ORAL_TABLET | Freq: Every day | ORAL | Status: DC
  Administered 2011-03-21 – 2011-03-23 (×3): 10 mg via ORAL
  Filled 2011-03-21 (×4): qty 1

## 2011-03-21 MED ORDER — ALBUTEROL SULFATE (5 MG/ML) 0.5% IN NEBU
2.5000 mg | INHALATION_SOLUTION | RESPIRATORY_TRACT | Status: DC
  Administered 2011-03-21: 2.5 mg via RESPIRATORY_TRACT
  Filled 2011-03-21: qty 0.5

## 2011-03-21 MED ORDER — FUROSEMIDE 80 MG PO TABS
120.0000 mg | ORAL_TABLET | Freq: Two times a day (BID) | ORAL | Status: DC
  Administered 2011-03-22 – 2011-03-23 (×4): 120 mg via ORAL
  Filled 2011-03-21 (×7): qty 1

## 2011-03-21 MED ORDER — LEVALBUTEROL HCL 0.63 MG/3ML IN NEBU
0.6300 mg | INHALATION_SOLUTION | RESPIRATORY_TRACT | Status: DC | PRN
  Administered 2011-03-22: 0.63 mg via RESPIRATORY_TRACT
  Filled 2011-03-21: qty 3

## 2011-03-21 MED ORDER — DEXTROSE 5 % IV SOLN
1.0000 g | INTRAVENOUS | Status: DC
  Administered 2011-03-21 – 2011-03-22 (×2): 1 g via INTRAVENOUS
  Filled 2011-03-21 (×3): qty 10

## 2011-03-21 MED ORDER — BISACODYL 5 MG PO TBEC
5.0000 mg | DELAYED_RELEASE_TABLET | Freq: Every day | ORAL | Status: DC | PRN
  Filled 2011-03-21: qty 1

## 2011-03-21 NOTE — ED Notes (Signed)
3710-01 READY 

## 2011-03-21 NOTE — Progress Notes (Signed)
Shift report received from [employer].  Patient resting in bed. Patient is A./O X4.  O2 is currently at 2L N/C. Patients respiratory pattern is even however lung sounds are diminished in the RT lower lobes.  IV fluids NS @ 158ml.hr are infusing, no signs of distress.  Patients skin is intact she denies any pain or verbal complaints at this time.  Will continue to monitor patient hourly.  Call bell in reach and bed brakes are engaged.

## 2011-03-21 NOTE — ED Notes (Signed)
Pt's pcp is dr. Anne Fu. She states that she has been very short of breath and it has been increasing. Pt unable to relax and is unable to speak in complete sentences. Pt taken directly back to pod.

## 2011-03-21 NOTE — ED Provider Notes (Addendum)
History     CSN: [id number]  Arrival date & time 03/21/11  1511   First MD Initiated Contact with Patient 03/21/11 1519      Chief Complaint  Patient presents with  . Shortness of Breath    (Consider location/radiation/quality/duration/timing/severity/associated sxs/prior treatment) HPI Comments: Patient complains of 2-3 days of increasing cough and shortness of breath.  She's had some subjective fevers but has not checked her temperature.  She does admit to a history of COPD and has been on inhalers at home.  She also has coronary artery disease history but denies chest pain today.  Patient states that she was sent in by her cardiologist for  her breathing today.  Patient also appears very anxious and is unable to give much history because she's anxious and short of breath.  Patient had some associated nausea 2 days ago.  No significant diarrhea or abdominal pain.  Patient is a 65 y.o. female presenting with shortness of breath. The history is provided by the patient. No language interpreter was used.  Shortness of Breath  The current episode started 2 days ago. The onset was gradual. The problem is moderate. Associated symptoms include a fever, cough and shortness of breath. Pertinent negatives include no chest pain.    Past Medical History  Diagnosis Date  . SOB (shortness of breath)   . CAD (coronary artery disease)     Prior LAD stenting with stent thrombosis occurring in a withdrawal phase of her Plavix undertaken because of trigeminal neuralgia and release surgery. It was treated medically.  . Anemia   . CHF (congestive heart failure)   . GERD (gastroesophageal reflux disease)   . Hyperlipidemia   . Osteoarthritis   . Ischemic cardiomyopathy   . Biventricular implantable cardiac defibrillator in situ     St. Jude implanted 2009    History reviewed. No pertinent past surgical history.  No family history on file.  History  Substance Use Topics  . Smoking status:  Former Games developer  . Smokeless tobacco: Not on file  . Alcohol Use: No    OB History    Grav Para Term Preterm Abortions TAB SAB Ect Mult Living                  Review of Systems  Constitutional: Positive for fever. Negative for chills.  HENT: Negative.   Eyes: Negative.  Negative for discharge and redness.  Respiratory: Positive for cough and shortness of breath.   Cardiovascular: Negative.  Negative for chest pain.  Gastrointestinal: Positive for nausea. Negative for vomiting, abdominal pain and diarrhea.  Genitourinary: Negative.  Negative for dysuria and vaginal discharge.  Musculoskeletal: Negative.  Negative for back pain.  Skin: Negative.  Negative for color change and rash.  Neurological: Negative.  Negative for syncope and headaches.  Hematological: Negative.  Negative for adenopathy.  Psychiatric/Behavioral: Negative for confusion. The patient is nervous/anxious.   All other systems reviewed and are negative.    Allergies  Review of patient's allergies indicates no known allergies.  Home Medications   Current Outpatient Rx  Name Route Sig Dispense Refill  . ACETAMINOPHEN ER 650 MG PO TBCR Oral Take 650 mg by mouth every 8 (eight) hours as needed.      . ALBUTEROL SULFATE HFA 108 (90 BASE) MCG/ACT IN AERS Inhalation Inhale 1 puff into the lungs every 6 (six) hours as needed.      . ASPIRIN EC 325 MG PO TBEC Oral Take 325 mg by mouth daily.      Marland Kitchen  CARVEDILOL 6.25 MG PO TABS  1 tab twice a day    . CYCLOBENZAPRINE HCL 5 MG PO TABS  1 tab three times a day prn    . ESOMEPRAZOLE MAGNESIUM 40 MG PO CPDR Oral Take 40 mg by mouth daily before breakfast.      . FENTANYL 25 MCG/HR TD PT72  Change patch every 3 days    . FERROUS SULFATE 325 (65 FE) MG PO TABS Oral Take 325 mg by mouth 2 (two) times daily with a meal.      . FUROSEMIDE 80 MG PO TABS  1 1/2 tab twice a a day    . GABAPENTIN 800 MG PO TABS  1 tab four times a day as needed    . HYDROCODONE-ACETAMINOPHEN 10-325  MG PO TABS  1 tab as directed    . LEVOTHYROXINE SODIUM 50 MCG PO TABS  1 tab daily    . NITROSTAT 0.4 MG SL SUBL      . NORTRIPTYLINE HCL 50 MG PO CAPS  2 tabs at bed time    . PROMETHAZINE HCL 25 MG PO TABS  As needed for nasuea    . RAMIPRIL 2.5 MG PO CAPS  daily    . SIMVASTATIN 10 MG PO TABS  1 tab daily    . SPIRONOLACTONE 25 MG PO TABS  1 tab daily    . VENLAFAXINE HCL 75 MG PO TABS  1 tab twice a day    . ZOLPIDEM TARTRATE 10 MG PO TABS  1 tab qhs      BP 114/69  Pulse 118  Temp(Src) 98.3 F (36.8 C) (Oral)  Resp 22  Ht 5\' 4"  (1.626 m)  Wt 125 lb (56.7 kg)  BMI 21.46 kg/m2  SpO2 97%  Physical Exam  Constitutional: She is oriented to person, place, and time. She appears well-developed and well-nourished.  Non-toxic appearance. She does not have a sickly appearance.  HENT:  Head: Normocephalic and atraumatic.  Eyes: Conjunctivae, EOM and lids are normal. Pupils are equal, round, and reactive to light. No scleral icterus.  Neck: Trachea normal and normal range of motion. Neck supple.  Cardiovascular: Regular rhythm, S1 normal, S2 normal and normal heart sounds.  Tachycardia present.   Pulmonary/Chest: Tachypnea noted. She has decreased breath sounds. She has no wheezes. She has rhonchi. She has no rales.       Patient is able to speak in short sentences.  She appears anxious.  There is scattered rhonchi on her lung exam but no frank wheezing.  She has mildly decreased breath sounds bilaterally.  Abdominal: Soft. Normal appearance. There is no tenderness. There is no rebound, no guarding and no CVA tenderness.  Musculoskeletal: Normal range of motion.  Neurological: She is alert and oriented to person, place, and time. She has normal strength.  Skin: Skin is warm, dry and intact. No rash noted.  Psychiatric: She has a normal mood and affect. Her behavior is normal. Judgment and thought content normal.       Patient appears mildly anxious and is unable to sit still in the bed.      ED Course  Procedures (including critical care time)  Results for orders placed during the hospital encounter of 03/21/11  CBC      Component Value Range   WBC 7.0  4.0 - 10.5 (K/uL)   RBC 3.39 (*) 3.87 - 5.11 (MIL/uL)   Hemoglobin 8.7 (*) 12.0 - 15.0 (g/dL)   HCT 16.1 (*) 09.6 -  46.0 (%)   MCV 80.2  78.0 - 100.0 (fL)   MCH 25.7 (*) 26.0 - 34.0 (pg)   MCHC 32.0  30.0 - 36.0 (g/dL)   RDW 16.1  09.6 - 04.5 (%)   Platelets 279  150 - 400 (K/uL)  DIFFERENTIAL      Component Value Range   Neutrophils Relative 66  43 - 77 (%)   Neutro Abs 4.7  1.7 - 7.7 (K/uL)   Lymphocytes Relative 10 (*) 12 - 46 (%)   Lymphs Abs 0.7  0.7 - 4.0 (K/uL)   Monocytes Relative 24 (*) 3 - 12 (%)   Monocytes Absolute 1.7 (*) 0.1 - 1.0 (K/uL)   Eosinophils Relative 0  0 - 5 (%)   Eosinophils Absolute 0.0  0.0 - 0.7 (K/uL)   Basophils Relative 0  0 - 1 (%)   Basophils Absolute 0.0  0.0 - 0.1 (K/uL)  BASIC METABOLIC PANEL      Component Value Range   Sodium 132 (*) 135 - 145 (mEq/L)   Potassium 3.5  3.5 - 5.1 (mEq/L)   Chloride 94 (*) 96 - 112 (mEq/L)   CO2 27  19 - 32 (mEq/L)   Glucose, Bld 109 (*) 70 - 99 (mg/dL)   BUN 19  6 - 23 (mg/dL)   Creatinine, Ser 4.09  0.50 - 1.10 (mg/dL)   Calcium 9.9  8.4 - 81.1 (mg/dL)   GFR calc non Af Amer >90  >90 (mL/min)   GFR calc Af Amer >90  >90 (mL/min)  CARDIAC PANEL(CRET KIN+CKTOT+MB+TROPI)      Component Value Range   Total CK 131  7 - 177 (U/L)   CK, MB 2.4  0.3 - 4.0 (ng/mL)   Troponin I <0.30  <0.30 (ng/mL)   Relative Index 1.8  0.0 - 2.5    Dg Chest 2 View  03/21/2011  *RADIOLOGY REPORT*  Clinical Data: Chest pain.  Cough.  CHEST - 2 VIEW  Comparison: 10/09/2010.  Findings: Cardiomegaly.  Unchanged three lead cardiac pacemaker / AICD.  Enlargement of pulmonary arteries suggesting pulmonary arterial hypertension.  Emphysema.  Patchy right lower lobe airspace disease is present which may represent asymmetric/atypical pulmonary edema or pneumonia.   Aspiration pneumonitis considered less likely.  IMPRESSION:  1.  Patchy right lower lobe airspace disease suspicious for pneumonia.  This is superimposed on chronic pulmonary parenchymal scarring. 2.  Cardiomegaly, unchanged pacemaker apparatus and emphysema.  Original Report Authenticated By: Andreas Newport, M.D.      Date: 03/21/2011  Rate: 108  Rhythm: sinus tachycardia  QRS Axis: normal  Intervals: normal  ST/T Wave abnormalities: normal  Conduction Disutrbances:nonspecific intraventricular conduction delay  Narrative Interpretation:   Old EKG Reviewed: changes noted from 12/04/2009 where the patient had more atrial paced beats than she does today.    MDM  Patient with symptoms consistent with pneumonia and x-ray findings that confirm this.  Patient would be a community-acquired pneumonia and so has been given azithromycin and ceftriaxone.  She states that the breathing treatment did subjectively help her although she still appears to have some mild shortness of breath subjectively on exam.  Given her initial presentation with tachycardia and some moderate respiratory distress I feel this patient will warrant from admission to be monitored for improvement, continue breathing treatments and continued IV antibiotics.  Patient's primary care physician is Dr. Tiburcio Pea from [facility] I will contact the [facility] physicians.        Nat Christen, MD  03/21/11 1708  Pt discussed with [facility] for admission.  Patient reassessed as well and found to still be tachycardic to the 120s and having oxygen saturations between 87 to 92% on room air.  Patient was placed on 2 L of oxygen by nasal cannula.  Nat Christen, MD 03/21/11 [phone]

## 2011-03-21 NOTE — H&P (Signed)
[name] MRN: [id number] DOB/AGE: 67/18/1947 67 y.o. Primary Care Physician:Harris, Osborn Coho, MD Admit date: 03/21/2011 Chief Complaint: Cough, shortness of breath HPI:  [name] is a 65 year old Caucasian female with history of tobacco abuse, history of coronary artery disease status post prior stenting to the LAD with stent thrombosis, history of CHF, history of GERD and hyperlipidemia, history of ischemic cardiomyopathy, history of biventricular implantable cardiac defibrillator in 2009 who presents to the ED with a 2 day history of worsening shortness of breath, wheezing, nonproductive cough, subjective fevers, nausea, one episode of emesis. Patient also endorses some generalized weakness. Patient denies any chills, no abdominal pain, no diarrhea, no constipation, no focal neurological symptoms. Patient states that as have some chest pain which is associated with coughing. Patient called her cardiologist's office and was told to present to the ED. In the ED chest x-ray which was done was consistent with a pneumonia. First set of cardiac enzymes which were drawn were negative. EKG which was done showed a sinus tachycardia. Patient was given some nebulizer treatment as well as IV antibiotics with some improvement in his symptoms, we were called to admit the patient for further evaluation and management.  Past Medical History  Diagnosis Date  . SOB (shortness of breath)   . CAD (coronary artery disease)     Prior LAD stenting with stent thrombosis occurring in a withdrawal phase of her Plavix undertaken because of trigeminal neuralgia and release surgery. It was treated medically.  . Anemia   . CHF (congestive heart failure)   . GERD (gastroesophageal reflux disease)   . Hyperlipidemia   . Osteoarthritis   . Ischemic cardiomyopathy   . Biventricular implantable cardiac defibrillator in situ     St. Jude implanted 2009  . Complication of anesthesia     massive heart attack  .  Cancer     small bladder cancer removed no  treatment    Past Surgical History  Procedure Date  . Brain surgery     x3  . Appendectomy   . Joint replacement     thumbs and knees replacement  . Tubal ligation   . Back surgery     x2 back surgeries    Prior to Admission medications   Medication Sig Start Date End Date Taking? Authorizing Provider  acetaminophen (TYLENOL ARTHRITIS PAIN) 650 MG CR tablet Take 650 mg by mouth every 8 (eight) hours as needed. For pain   Yes Historical Provider, MD  albuterol (PROAIR HFA) 108 (90 BASE) MCG/ACT inhaler Inhale 1 puff into the lungs every 6 (six) hours as needed. For shortness of breath   Yes Historical Provider, MD  aspirin EC 325 MG tablet Take 325 mg by mouth daily.    Yes Historical Provider, MD  carvedilol (COREG) 6.25 MG tablet Take 6.25 mg by mouth 2 (two) times daily with a meal. 1 tab twice a day 09/23/10  Yes Historical Provider, MD  Oxycodone HCl 10 MG TABS Take 10 mg by mouth every 6 (six) hours as needed. For pain    Yes Historical Provider, MD  spironolactone (ALDACTONE) 25 MG tablet Take 25 mg by mouth daily.     Yes Historical Provider, MD  cyclobenzaprine (FLEXERIL) 5 MG tablet Take 5 mg by mouth 3 (three) times daily as needed. For muscle spasms 09/12/10   Historical Provider, MD  esomeprazole (NEXIUM) 40 MG capsule Take 40 mg by mouth daily as needed. For reflux    Historical Provider, MD  ferrous  sulfate 325 (65 FE) MG tablet Take 325 mg by mouth 2 (two) times daily with a meal.     Historical Provider, MD  furosemide (LASIX) 80 MG tablet Take 120 mg by mouth 2 (two) times daily.  10/05/10   Historical Provider, MD  gabapentin (NEURONTIN) 800 MG tablet Take 800 mg by mouth 4 (four) times daily as needed. For pain 10/23/10   Historical Provider, MD  levothyroxine (SYNTHROID, LEVOTHROID) 50 MCG tablet Take 50 mcg by mouth daily. 1 tab daily 10/23/10   Historical Provider, MD  NITROSTAT 0.4 MG SL tablet Place 0.4 mg under the tongue  every 5 (five) minutes as needed. For chest pain 09/08/10   Historical Provider, MD  nortriptyline (PAMELOR) 50 MG capsule Take 100 mg by mouth at bedtime.  09/23/10   Historical Provider, MD  promethazine (PHENERGAN) 25 MG tablet Take 25 mg by mouth every 6 (six) hours as needed. for nasuea 08/19/10   Historical Provider, MD  ramipril (ALTACE) 2.5 MG capsule Take 2.5 mg by mouth daily. daily 09/23/10   Historical Provider, MD  simvastatin (ZOCOR) 10 MG tablet Take 10 mg by mouth daily.  09/16/10   Historical Provider, MD  venlafaxine (EFFEXOR) 75 MG tablet Take 75 mg by mouth 2 (two) times daily.  09/07/10   Historical Provider, MD  zolpidem (AMBIEN) 10 MG tablet Take 10 mg by mouth at bedtime as needed. For sleep 09/23/10   Historical Provider, MD    Allergies: No Known Allergies  No family history on file.  Social History:  reports that she quit smoking 7 days ago. Her smoking use included Cigarettes. She has a 15 pack-year smoking history. She has never used smokeless tobacco. She reports that she does not drink alcohol or use illicit drugs.  ROS: All systems reviewed with the patient and was positive as per HPI otherwise all other systems are negative.  PHYSICAL EXAM: Blood pressure 104/70, pulse 109, temperature 99.4 F (37.4 C), temperature source Oral, resp. rate 19, height 5\' 4"  (1.626 m), weight 56.7 kg (125 lb), SpO2 100.00%. General: Thin frail female who looks older than her stated age. Speaking in full sentences. In no acute cardiopulmonary distress.  HEENT: Normocephalic atraumatic. Pupils equal round and reactive to light and accommodation. Extraocular movements intact. Oropharynx is clear no lesions, no exudates, neck is supple with no lymphadenopathy. No bruits, no goiter. Heart: Tachycardia, without murmurs, rubs, gallops. Lungs: Coarse breath sounds/rhonchi right greater than left. Abdomen: Soft, nontender, nondistended, positive bowel sounds. Extremities: No clubbing cyanosis or  edema with positive pedal pulses. Neuro: Grossly intact, nonfocal.   EKG: Sinus tachycardia  No results found for this or any previous visit (from the past 240 hour(s)).   Lab results:  [facility] 03/21/11 1548  NA 132*  K 3.5  CL 94*  CO2 27  GLUCOSE 109*  BUN 19  CREATININE 0.50  CALCIUM 9.9  MG --  PHOS --   No results found for this basename: AST:2,ALT:2,ALKPHOS:2,BILITOT:2,PROT:2,ALBUMIN:2 in the last 72 hours No results found for this basename: LIPASE:2,AMYLASE:2 in the last 72 hours  Basename 03/21/11 1548  WBC 7.0  NEUTROABS 4.7  HGB 8.7*  HCT 27.2*  MCV 80.2  PLT 279    Basename 03/21/11 1545  CKTOTAL 131  CKMB 2.4  CKMBINDEX --  TROPONINI <0.30   No components found with this basename: POCBNP:3 No results found for this basename: DDIMER in the last 72 hours No results found for this basename: HGBA1C:2 in the last  72 hours No results found for this basename: CHOL:2,HDL:2,LDLCALC:2,TRIG:2,CHOLHDL:2,LDLDIRECT:2 in the last 72 hours No results found for this basename: TSH,T4TOTAL,FREET3,T3FREE,THYROIDAB in the last 72 hours No results found for this basename: VITAMINB12:2,FOLATE:2,FERRITIN:2,TIBC:2,IRON:2,RETICCTPCT:2 in the last 72 hours Imaging results:  Dg Chest 2 View  03/21/2011  *RADIOLOGY REPORT*  Clinical Data: Chest pain.  Cough.  CHEST - 2 VIEW  Comparison: 10/09/2010.  Findings: Cardiomegaly.  Unchanged three lead cardiac pacemaker / AICD.  Enlargement of pulmonary arteries suggesting pulmonary arterial hypertension.  Emphysema.  Patchy right lower lobe airspace disease is present which may represent asymmetric/atypical pulmonary edema or pneumonia.  Aspiration pneumonitis considered less likely.  IMPRESSION:  1.  Patchy right lower lobe airspace disease suspicious for pneumonia.  This is superimposed on chronic pulmonary parenchymal scarring. 2.  Cardiomegaly, unchanged pacemaker apparatus and emphysema.  Original Report Authenticated By: Andreas Newport, M.D.   Impression/Plan:  Active Problems:  Community acquired pneumonia  CAD (coronary artery disease)  Anemia  Hyperlipidemia  #1 community-acquired pneumonia Will admit the patient to a telemetry floor. Will check a sputum Gram stain and culture. We'll place on oxygen, nebulizer treatments, mucolytic, IV Rocephin and azithromycin. Symptomatic and supportive care. Problem #2 anemia Patient does have a history of anemia. No overt GI bleed. Will check an anemia panel. Follow H&H. #3 coronary artery disease/CHF Stable. First set of cardiac enzymes were negative. EKG with a sinus tachycardia. Patient is asymptomatic. Will check a TSH. Continue home regimen of Coreg, aspirin, Lasix, ramipril, spironolactone, simvastatin. #4 hyperlipidemia Check a fasting lipid panel. Continue home dose statin. #5 prophylaxis Protonix for GI prophylaxis. Lovenox for DVT prophylaxis. It's been a pleasure taking care of Ms. Louque.   Hallis Meditz 03/21/2011, 9:14 PM

## 2011-03-21 NOTE — ED Notes (Signed)
Resting quietly in bed. Resp even and unlabored. Complaining of facial neuralgia pain 4/10. Medicated per md order. Awaiting admission. NAD Noted.

## 2011-03-22 LAB — URINALYSIS, ROUTINE W REFLEX MICROSCOPIC
Bilirubin Urine: NEGATIVE
Hgb urine dipstick: NEGATIVE
Protein, ur: NEGATIVE mg/dL
Urobilinogen, UA: 1 mg/dL (ref 0.0–1.0)

## 2011-03-22 LAB — CBC
MCH: 25.3 pg — ABNORMAL LOW (ref 26.0–34.0)
MCV: 80.4 fL (ref 78.0–100.0)
Platelets: 251 10*3/uL (ref 150–400)
RDW: 15.6 % — ABNORMAL HIGH (ref 11.5–15.5)
WBC: 7.1 10*3/uL (ref 4.0–10.5)

## 2011-03-22 LAB — VITAMIN B12: Vitamin B-12: 1448 pg/mL — ABNORMAL HIGH (ref 211–911)

## 2011-03-22 LAB — IRON AND TIBC: Iron: 17 ug/dL — ABNORMAL LOW (ref 42–135)

## 2011-03-22 LAB — FOLATE: Folate: 15.3 ng/mL

## 2011-03-22 MED ORDER — IPRATROPIUM BROMIDE 0.02 % IN SOLN
0.5000 mg | Freq: Two times a day (BID) | RESPIRATORY_TRACT | Status: DC
  Administered 2011-03-22 – 2011-03-24 (×3): 0.5 mg via RESPIRATORY_TRACT
  Filled 2011-03-22 (×3): qty 2.5

## 2011-03-22 MED ORDER — SPIRONOLACTONE 25 MG PO TABS
25.0000 mg | ORAL_TABLET | Freq: Every day | ORAL | Status: DC
  Administered 2011-03-22 – 2011-03-23 (×2): 25 mg via ORAL
  Filled 2011-03-22 (×2): qty 1

## 2011-03-22 MED ORDER — LEVALBUTEROL HCL 0.63 MG/3ML IN NEBU
0.6300 mg | INHALATION_SOLUTION | Freq: Two times a day (BID) | RESPIRATORY_TRACT | Status: DC
  Administered 2011-03-22 – 2011-03-24 (×3): 0.63 mg via RESPIRATORY_TRACT
  Filled 2011-03-22 (×6): qty 3

## 2011-03-22 NOTE — Plan of Care (Signed)
Problem: Phase II Progression Outcomes Goal: Pain controlled Outcome: Progressing Pain has been controlled during the course of the night Goal: Progress activity as tolerated unless otherwise ordered Outcome: Progressing Patient has tolerated activities during the course of the shift Goal: Tolerating diet Outcome: Progressing Patient has tolerated diet during the course of the shift

## 2011-03-22 NOTE — Progress Notes (Signed)
Subjective: Feeling better today, less short of breath.  Objective: Filed Vitals:   03/21/11 2216 03/22/11 0500 03/22/11 0817 03/22/11 0822  BP: 96/57 92/58    Pulse:  100    Temp:  98.4 F (36.9 C)    TempSrc:      Resp:  18    Height:      Weight:      SpO2:  96% 100% 100%   Weight change:  No intake or output data in the 24 hours ending 03/22/11 1030  General: Alert, awake, oriented x3, in no acute distress.  HEENT: No bruits, no goiter.  Heart: Regular rate and rhythm, without murmurs, rubs, gallops.  Lungs: Crackles bl, bilateral air movement.  Abdomen: Soft, nontender, nondistended, positive bowel sounds.  Neuro: Grossly intact, nonfocal. Extremities: no edema.   Lab Results:  Basename 03/22/11 0630 03/21/11 2144 03/21/11 1548  NA -- -- 132*  K -- -- 3.5  CL -- -- 94*  CO2 -- -- 27  GLUCOSE -- -- 109*  BUN -- -- 19  CREATININE -- 0.50 0.50  CALCIUM -- -- 9.9  MG 2.2 -- --  PHOS -- -- --   Basename 03/22/11 0630 03/21/11 2144 03/21/11 1548  WBC 7.1 7.5 --  NEUTROABS -- -- 4.7  HGB 8.5* 8.2* --  HCT 27.0* 25.5* --  MCV 80.4 79.9 --  PLT 251 244 --    Basename 03/21/11 1545  CKTOTAL 131  CKMB 2.4  CKMBINDEX --  TROPONINI <0.30    Micro Results: No results found for this or any previous visit (from the past 240 hour(s)).  Studies/Results: Dg Chest 2 View  03/21/2011  *RADIOLOGY REPORT*  Clinical Data: Chest pain.  Cough.  CHEST - 2 VIEW  Comparison: 10/09/2010.  Findings: Cardiomegaly.  Unchanged three lead cardiac pacemaker / AICD.  Enlargement of pulmonary arteries suggesting pulmonary arterial hypertension.  Emphysema.  Patchy right lower lobe airspace disease is present which may represent asymmetric/atypical pulmonary edema or pneumonia.  Aspiration pneumonitis considered less likely.  IMPRESSION:  1.  Patchy right lower lobe airspace disease suspicious for pneumonia.  This is superimposed on chronic pulmonary parenchymal scarring. 2.   Cardiomegaly, unchanged pacemaker apparatus and emphysema.  Original Report Authenticated By: Andreas Newport, M.D.    Medications: I have reviewed the patient's current medications.   Patient Active Hospital Problem List:  CAD (coronary artery disease) () Cardiomyopathy, EF 20 to 25 % ECHO 2009.  Continue with lasix, spironolactone. Hold ACE due to soft SBP in setting of infection. Sat 94 room air.   Community acquired pneumonia (03/21/2011) Dyspnea better. Continue with Ceftriaxone and Azithromycin day 2.   Anemia (03/21/2011) Hb stable. HB 1 year ago at 9. Continue with ferrous sulfate. Will follow anemia panel.   Hyperlipidemia (03/21/2011) Continue with simvastatin.   Mild hyponatremia: Monitor. Continue with lasix.    LOS: 1 day   Alie Moudy M.D.  [facility] 03/22/2011, 10:30 AM

## 2011-03-22 NOTE — Plan of Care (Addendum)
Problem: Phase II Progression Outcomes Goal: Pain controlled Outcome: Completed/Met Date Met:  03/22/11 Upon reassessment of pain patient was sleeping and denied pain medicine during the shift

## 2011-03-22 NOTE — Progress Notes (Signed)
Patient has remained free from injury during the course of the shift no signs of cardiac or respiratory distress.  Patient denies any verbal complaints or requests. o2 currently at 2L [place], latest pulse ox reads 96%.  IV access intact.  Call bell is in reach and bed brakes are engaged.  Will continue to monitor patient during the course of the shift.

## 2011-03-22 NOTE — Plan of Care (Signed)
Problem: Phase II Progression Outcomes Goal: Tolerating diet Outcome: Progressing Patient has tolerated diet regimen during the course of the shift

## 2011-03-23 LAB — BASIC METABOLIC PANEL
Calcium: 9 mg/dL (ref 8.4–10.5)
Calcium: 9.3 mg/dL (ref 8.4–10.5)
Creatinine, Ser: 0.51 mg/dL (ref 0.50–1.10)
Creatinine, Ser: 0.57 mg/dL (ref 0.50–1.10)
GFR calc Af Amer: >90 mL/min (ref >90)
GFR calc non Af Amer: >90 mL/min (ref >90)
GFR calc non Af Amer: >90 mL/min (ref >90)
Sodium: 135 mEq/L (ref 135–145)

## 2011-03-23 LAB — CBC
MCH: 25.9 pg — ABNORMAL LOW (ref 26.0–34.0)
MCHC: 32.2 g/dL (ref 30.0–36.0)
MCV: 80.7 fL (ref 78.0–100.0)
Platelets: 255 10*3/uL (ref 150–400)
RDW: 15.6 % — ABNORMAL HIGH (ref 11.5–15.5)
WBC: 7.7 10*3/uL (ref 4.0–10.5)

## 2011-03-23 LAB — URINE CULTURE: Culture: NO GROWTH

## 2011-03-23 MED ORDER — LEVOFLOXACIN 750 MG PO TABS
750.0000 mg | ORAL_TABLET | ORAL | Status: DC
  Administered 2011-03-23: 750 mg via ORAL
  Filled 2011-03-23 (×2): qty 1

## 2011-03-23 MED ORDER — POTASSIUM CHLORIDE CRYS ER 20 MEQ PO TBCR
40.0000 meq | EXTENDED_RELEASE_TABLET | Freq: Two times a day (BID) | ORAL | Status: DC
  Administered 2011-03-23 (×2): 40 meq via ORAL
  Filled 2011-03-23 (×4): qty 2

## 2011-03-23 MED ORDER — AZITHROMYCIN 500 MG PO TABS
500.0000 mg | ORAL_TABLET | Freq: Every day | ORAL | Status: DC
  Filled 2011-03-23: qty 1

## 2011-03-23 MED ORDER — POTASSIUM CHLORIDE 10 MEQ/100ML IV SOLN
10.0000 meq | INTRAVENOUS | Status: AC
  Administered 2011-03-23 (×3): 10 meq via INTRAVENOUS
  Filled 2011-03-23 (×3): qty 100

## 2011-03-23 NOTE — Progress Notes (Signed)
Subjective: Feeling better today. Breathing better. Has some loose stool.   Objective: Filed Vitals:   03/22/11 1400 03/22/11 2156 03/22/11 2200 03/23/11 0500  BP: 93/60  101/61 96/52  Pulse: 89  102 92  Temp: 98 F (36.7 C)  98.1 F (36.7 C) 97.9 F (36.6 C)  TempSrc: Oral     Resp: 18  20 18   Height:      Weight:    56.7 kg (125 lb)  SpO2: 95% 94% 93% 93%   Weight change: 0 kg (0 oz)  Intake/Output Summary (Last 24 hours) at 03/23/11 1303 Last data filed at 03/23/11 1216  Gross per 24 hour  Intake    240 ml  Output      2 ml  Net    238 ml    General: Alert, awake, oriented x3, in no acute distress.  HEENT: No bruits, no goiter.  Heart: Regular rate and rhythm, without murmurs, rubs, gallops.  Lungs: Crackles bl, bilateral air movement.  Abdomen: Soft, nontender, nondistended, positive bowel sounds.  Neuro: Grossly intact, nonfocal. Extremities: No edema.   Lab Results:  Basename 03/23/11 0605 03/22/11 0630 03/21/11 2144 03/21/11 1548  NA 139 -- -- 132*  K 2.5* -- -- 3.5  CL 98 -- -- 94*  CO2 30 -- -- 27  GLUCOSE 113* -- -- 109*  BUN 13 -- -- 19  CREATININE 0.51 -- 0.50 --  CALCIUM 9.0 -- -- 9.9  MG -- 2.2 -- --  PHOS -- -- -- --    Basename 03/23/11 0605 03/22/11 0630 03/21/11 1548  WBC 7.7 7.1 --  NEUTROABS -- -- 4.7  HGB 8.2* 8.5* --  HCT 25.5* 27.0* --  MCV 80.7 80.4 --  PLT 255 251 --    Basename 03/21/11 1545  CKTOTAL 131  CKMB 2.4  CKMBINDEX --  TROPONINI <0.30    Basename 03/22/11 0630  TSH 0.010*  T4TOTAL --  T3FREE --  THYROIDAB --    Basename 03/22/11 0630  VITAMINB12 1448*  FOLATE 15.3  FERRITIN 90  TIBC 388  IRON 17*  RETICCTPCT --     Studies/Results: Dg Chest 2 View  03/21/2011  *RADIOLOGY REPORT*  Clinical Data: Chest pain.  Cough.  CHEST - 2 VIEW  Comparison: 10/09/2010.  Findings: Cardiomegaly.  Unchanged three lead cardiac pacemaker / AICD.  Enlargement of pulmonary arteries suggesting pulmonary arterial  hypertension.  Emphysema.  Patchy right lower lobe airspace disease is present which may represent asymmetric/atypical pulmonary edema or pneumonia.  Aspiration pneumonitis considered less likely.  IMPRESSION:  1.  Patchy right lower lobe airspace disease suspicious for pneumonia.  This is superimposed on chronic pulmonary parenchymal scarring. 2.  Cardiomegaly, unchanged pacemaker apparatus and emphysema.  Original Report Authenticated By: Andreas Newport, M.D.    Medications: I have reviewed the patient's current medications.   CAD (coronary artery disease) () Cardiomyopathy, EF 20 to 25 % ECHO 2009.  Continue with lasix, spironolactone. Hold ACE due to soft SBP in setting of infection.  Sat 94 room air.   Community acquired pneumonia (03/21/2011) Dyspnea better. Ceftriaxone and Azithromycin for 3 days. I will start Levaquin.    Anemia (03/21/2011) Hb stable. HB 1 year ago at 9. Continue with ferrous sulfate. Will follow anemia panel.   Hyperlipidemia (03/21/2011) Continue with simvastatin.  Mild hyponatremia: Continue with lasix. Resolved.  Hypokalemia: KCL 3 runs. 40 meq po times 2. Repeat Bmet this afternoon.  Diarrhea; probably secondary to antibiotics.  Monitor if watery will check  for C diff.   LOS: 2 days   Luddie Boghosian M.D.  [facility] 03/23/2011, 1:03 PM

## 2011-03-24 LAB — BASIC METABOLIC PANEL
Calcium: 9.7 mg/dL (ref 8.4–10.5)
GFR calc Af Amer: >90 mL/min (ref >90)
GFR calc non Af Amer: >90 mL/min (ref >90)
Potassium: 3.8 mEq/L (ref 3.5–5.1)
Sodium: 138 mEq/L (ref 135–145)

## 2011-03-24 MED ORDER — LEVOFLOXACIN 750 MG PO TABS: 750.0000 mg | ORAL_TABLET | ORAL | Status: DC

## 2011-03-24 MED ORDER — BENZONATATE 100 MG PO CAPS: 100.0000 mg | ORAL_CAPSULE | Freq: Three times a day (TID) | ORAL | Status: AC

## 2011-03-24 NOTE — Progress Notes (Signed)
Retro ur ins review. 

## 2011-03-24 NOTE — Discharge Summary (Signed)
Admit date: 03/21/2011 Discharge date: 03/24/2011  Primary Care Physician:  Abe People, MD   Discharge Diagnoses:       Diagnoses Date Noted   . Community acquired pneumonia 03/21/2011   . Anemia 03/21/2011   . Hyperlipidemia 03/21/2011   . CAD (coronary artery disease)               DISCHARGE MEDICATION: Current Discharge Medication List    START taking these medications   Details  benzonatate (TESSALON) 100 MG capsule Take 1 capsule (100 mg total) by mouth 3 (three) times daily. Qty: 20 capsule, Refills: 0    levofloxacin (LEVAQUIN) 750 MG tablet Take 1 tablet (750 mg total) by mouth daily. Qty: 5 tablet, Refills: 0      CONTINUE these medications which have NOT CHANGED   Details  acetaminophen (TYLENOL ARTHRITIS PAIN) 650 MG CR tablet Take 650 mg by mouth every 8 (eight) hours as needed. For pain    albuterol (PROAIR HFA) 108 (90 BASE) MCG/ACT inhaler Inhale 1 puff into the lungs every 6 (six) hours as needed. For shortness of breath    aspirin EC 325 MG tablet Take 325 mg by mouth daily.     carvedilol (COREG) 6.25 MG tablet Take 6.25 mg by mouth 2 (two) times daily with a meal. 1 tab twice a day    Oxycodone HCl 10 MG TABS Take 10 mg by mouth every 6 (six) hours as needed. For pain     spironolactone (ALDACTONE) 25 MG tablet Take 25 mg by mouth daily.      cyclobenzaprine (FLEXERIL) 5 MG tablet Take 5 mg by mouth 3 (three) times daily as needed. For muscle spasms    esomeprazole (NEXIUM) 40 MG capsule Take 40 mg by mouth daily as needed. For reflux    ferrous sulfate 325 (65 FE) MG tablet Take 325 mg by mouth 2 (two) times daily with a meal.     furosemide (LASIX) 80 MG tablet Take 120 mg by mouth 2 (two) times daily.     gabapentin (NEURONTIN) 800 MG tablet Take 800 mg by mouth 4 (four) times daily as needed. For pain    levothyroxine (SYNTHROID, LEVOTHROID) 50 MCG tablet Take 50 mcg by mouth daily. 1 tab daily    NITROSTAT 0.4 MG SL tablet  Place 0.4 mg under the tongue every 5 (five) minutes as needed. For chest pain    nortriptyline (PAMELOR) 50 MG capsule Take 100 mg by mouth at bedtime.     promethazine (PHENERGAN) 25 MG tablet Take 25 mg by mouth every 6 (six) hours as needed. for nasuea    ramipril (ALTACE) 2.5 MG capsule Take 2.5 mg by mouth daily. daily    simvastatin (ZOCOR) 10 MG tablet Take 10 mg by mouth daily.     venlafaxine (EFFEXOR) 75 MG tablet Take 75 mg by mouth 2 (two) times daily.     zolpidem (AMBIEN) 10 MG tablet Take 10 mg by mouth at bedtime as needed. For sleep      STOP taking these medications     fentaNYL (DURAGESIC - DOSED MCG/HR) 25 MCG/HR      HYDROcodone-acetaminophen (NORCO) 10-325 MG per tablet            Consults:  none.   SIGNIFICANT DIAGNOSTIC STUDIES:  Dg Chest 2 View  03/21/2011  *RADIOLOGY REPORT*  Clinical Data: Chest pain.  Cough.  CHEST - 2 VIEW  Comparison: 10/09/2010.  Findings: Cardiomegaly.  Unchanged three lead cardiac  pacemaker / AICD.  Enlargement of pulmonary arteries suggesting pulmonary arterial hypertension.  Emphysema.  Patchy right lower lobe airspace disease is present which may represent asymmetric/atypical pulmonary edema or pneumonia.  Aspiration pneumonitis considered less likely.  IMPRESSION:  1.  Patchy right lower lobe airspace disease suspicious for pneumonia.  This is superimposed on chronic pulmonary parenchymal scarring. 2.  Cardiomegaly, unchanged pacemaker apparatus and emphysema.  Original Report Authenticated By: Andreas Newport, M.D.     Recent Results (from the past 240 hour(s))  URINE CULTURE     Status: Normal   Collection Time   03/22/11  8:07 PM      Component Value Range Status Comment   Specimen Description URINE, CLEAN CATCH   Final    Special Requests NONE   Final    Setup Time [id number]   Final    Colony Count NO GROWTH   Final    Culture NO GROWTH   Final    Report Status 03/23/2011 FINAL   Final     BRIEF ADMITTING H  & P: [name] is a 65 year old Caucasian female with history of tobacco abuse, history of coronary artery disease status post prior stenting to the LAD with stent thrombosis, history of CHF, history of GERD and hyperlipidemia, history of ischemic cardiomyopathy, history of biventricular implantable cardiac defibrillator in 2009 who presents to the ED with a 2 day history of worsening shortness of breath, wheezing, nonproductive cough, subjective fevers, nausea, one episode of emesis. Patient also endorses some generalized weakness. Patient denies any chills, no abdominal pain, no diarrhea, no constipation, no focal neurological symptoms. Patient states that as have some chest pain which is associated with coughing. Patient called her cardiologist's office and was told to present to the ED. In the ED chest x-ray which was done was consistent with a pneumonia. First set of cardiac enzymes which were drawn were negative. EKG which was done showed a sinus tachycardia. Patient was given some nebulizer treatment as well as IV antibiotics with some improvement in his symptoms, we were called to admit the patient for further evaluation and management.  Hospital course;  CAD (coronary artery disease) () Cardiomyopathy, EF 20 to 25 % ECHO 2009.  Continue with lasix, spironolactone, ACE .  Community acquired pneumonia (03/21/2011) She was admitted to the hospital with diagnosis of community-acquired pneumonia. She received 3 days of ceftriaxone and azithromycin. She was  started on Levaquin. She needs 5 more days of oral Levaquin. Patient shortness of breath now at baseline. She has not require oxygen anymore. She is willing to go home.    Anemia (03/21/2011) Hb stable. HB 1 year ago at 9. Continue with ferrous sulfate. Anemia panel consistent with iron deficiency anemia. He would need to follow with her primary care physician and consider a colonoscopy, if she has not had a screening  colonoscopy.  Hyperlipidemia (03/21/2011) Continue with simvastatin.  Hypokalemia: Patient received  KCL 10  milliequivalents iv times 3 runs. Potassium level at 3.8 the day of discharge. Vision will be discharged on her lisinopril. She would need to be made to follow potassium level. Diarrhea; probably secondary to antibiotics. This has resolved. Hypothyroidism; in reviewing lab patient had a TSH at 0.010. She would need free T3-T4 when she follow with her primary care physician.  Day of discharge patient was in improved condition and she wanted to go home.      Disposition and Follow-up:  Discharge Orders    Future Appointments: Provider: Department: Dept Phone:  Center:   04/17/2011 11:00 AM Evern Bio, NP [facility] [phone] CPR   05/16/2011 11:00 AM [name], MD [facility] [phone] CPR     Future Orders Please Complete By Expires   Diet - low sodium heart healthy      Increase activity slowly        Follow-up Information    Follow up with Abe People .          DISCHARGE EXAM:   General: Alert, awake, oriented x3, in no acute distress.  HEENT: No bruits, no goiter.  Heart: Regular rate and rhythm, without murmurs, rubs, gallops.  Lungs: Crackles bl, bilateral air movement.  Abdomen: Soft, nontender, nondistended, positive bowel sounds.  Neuro: Grossly intact, nonfocal.  Extremities: No edema.   Blood pressure 95/65, pulse 120, temperature 98.6 F (37 C), temperature source Oral, resp. rate 18, height 5\' 4"  (1.626 m), weight 56.7 kg (125 lb), SpO2 96.00%. Patient heart rate after he received scheduled medications was at 80 prior to discharge.    Basename 03/24/11 0721 03/23/11 1625 03/22/11 0630  NA 138 135 --  K 3.8 3.6 --  CL 99 97 --  CO2 29 31 --  GLUCOSE 127* 76 --  BUN 13 12 --  CREATININE 0.61 0.57 --  CALCIUM 9.7 9.3 --  MG -- -- 2.2  PHOS -- -- --    Basename 03/23/11 0605 03/22/11 0630 03/21/11 1548   WBC 7.7 7.1 --  NEUTROABS -- -- 4.7  HGB 8.2* 8.5* --  HCT 25.5* 27.0* --  MCV 80.7 80.4 --  PLT 255 251 --    Signed: Laporcha Marchesi M.D. 03/24/2011, 10:38 AM

## 2011-03-31 ENCOUNTER — Encounter (HOSPITAL_COMMUNITY): Payer: Self-pay

## 2011-03-31 ENCOUNTER — Emergency Department (HOSPITAL_COMMUNITY)
Admission: EM | Admit: 2011-03-31 | Discharge: 2011-03-31 | Disposition: A | Discharge status: Home / Self Care | Payer: Medicare Other

## 2011-03-31 NOTE — ED Notes (Signed)
Pt reports she was admitted to the hospital on 12/24.  Pt reports that she was d/c'd after 4 days.  Pt states "the medicine I am taking is causing diarrhea."  Pt reports she went to PCP today and he advised her to come to the ED for low BP and dehydration.  BP at triage is 98/57.

## 2011-04-17 ENCOUNTER — Encounter: Payer: Medicare Other | Admitting: Neurosurgery

## 2011-05-16 ENCOUNTER — Encounter: Payer: Medicare Other | Attending: Physical Medicine & Rehabilitation | Admitting: Physical Medicine & Rehabilitation

## 2011-05-16 DIAGNOSIS — M25579 Pain in unspecified ankle and joints of unspecified foot: Secondary | ICD-10-CM

## 2011-05-16 DIAGNOSIS — M961 Postlaminectomy syndrome, not elsewhere classified: Secondary | ICD-10-CM | POA: Insufficient documentation

## 2011-05-16 DIAGNOSIS — G8929 Other chronic pain: Secondary | ICD-10-CM | POA: Insufficient documentation

## 2011-05-16 DIAGNOSIS — G500 Trigeminal neuralgia: Secondary | ICD-10-CM

## 2011-05-16 DIAGNOSIS — M19079 Primary osteoarthritis, unspecified ankle and foot: Secondary | ICD-10-CM | POA: Insufficient documentation

## 2011-05-16 DIAGNOSIS — F341 Dysthymic disorder: Secondary | ICD-10-CM

## 2011-05-16 DIAGNOSIS — I428 Other cardiomyopathies: Secondary | ICD-10-CM | POA: Insufficient documentation

## 2011-05-16 NOTE — Assessment & Plan Note (Signed)
[name] is back regarding chronic pain.  Pain has been generally stable over the last month.  Her face seems to be issue, particularly in the colder weather months.  She uses her fentanyl patch but frequently has let it go 4-5 days or has forgot to put it on entirely and does not notice a big difference in her pain level.  Pain right now is a 2/10. Sleep is good.  REVIEW OF SYSTEMS:  Notable for bladder control issues, loss of taste and smell.  She has some anxiety.  Heart issues are unchanged.  She was hospitalized for pneumonia over the holiday.  Full 12-point review is in the written health and history section of the chart.  SOCIAL HISTORY:  Unchanged.  PHYSICAL EXAMINATION:  VITAL SIGNS:  Blood pressure is 111/54, pulse 92, respiratory rate is 16, and she is saturating 96% on room air. GENERAL:  The patient is pleasant, alert, anxious.  She has hypersensitivity over the face.  She has lost further weight, I believe, since I last saw her. NEUROLOGIC:  Sensory exam grossly intact in the lower extremities. Strength is grossly at 4-5/5 with 2+ reflexes.  She has normal balance and walking pattern today. HEART:  Regular. CHEST:  Clear.  ASSESSMENT: 1. Right-sided trigeminal neuralgia. 2. Postlaminectomy syndrome. 3. Osteoarthritis of the knees. 4. Cardiomyopathy.  PLAN: 1. We will stay with gabapentin as Lyrica was too expensive for her. 2. We will stop fentanyl patches as I do not feel that she is using     them reliably nor have they been efficacious for her. 3. We will use oxycodone 10 mg one q.6 h p.r.n., #100.  She is using     typically 3 of these a day. 4. Added Lidoderm ointment 5% for facial pain.  She can use this 3-4     times a day.  Hopefully, she will feel better with the warmer     weather months. 5. Nursing will see her back in 6 weeks, and I will see her back in 3     months.     [name], M.D. Electronically Signed    ZTS/MedQ D:   05/16/2011 11:45:00  T:  05/16/2011 12:20:52  Job #:  [id number]

## 2011-05-23 ENCOUNTER — Telehealth: Payer: Self-pay | Admitting: *Deleted

## 2011-05-23 NOTE — Telephone Encounter (Signed)
Pt aware.

## 2011-05-23 NOTE — Telephone Encounter (Signed)
Ok for now.  Please have pt think about how she might have accidentally taken this.  thx

## 2011-05-23 NOTE — Telephone Encounter (Signed)
Please look at UDS under Media Tab. UDS is inconsistent with a presence of Oxazepam, which is a metabolite of Diazepam. She states that she has not taking anything like this and doesn't have a rx for Diazepam. Please advise.

## 2011-06-07 ENCOUNTER — Other Ambulatory Visit: Payer: Self-pay | Admitting: Physical Medicine & Rehabilitation

## 2011-06-09 ENCOUNTER — Encounter: Payer: Self-pay | Admitting: *Deleted

## 2011-06-25 ENCOUNTER — Ambulatory Visit (INDEPENDENT_AMBULATORY_CARE_PROVIDER_SITE_OTHER): Payer: Medicare Other | Admitting: *Deleted

## 2011-06-25 ENCOUNTER — Encounter: Payer: Self-pay | Admitting: Internal Medicine

## 2011-06-25 DIAGNOSIS — I509 Heart failure, unspecified: Secondary | ICD-10-CM

## 2011-06-25 LAB — ICD DEVICE OBSERVATION
ATRIAL PACING ICD: 0.38 pct
BAMS-0001: 150 {beats}/min
DEVICE MODEL ICD: 544456
FVT: 0
HV IMPEDENCE: 40 Ohm
LV LEAD IMPEDENCE ICD: 462.5 Ohm
LV LEAD THRESHOLD: 1 V
RV LEAD AMPLITUDE: 12 mv
RV LEAD IMPEDENCE ICD: 412.5 Ohm
TOT-0007: 2
TOT-0008: 0
TOT-0009: 1
TZAT-0004FASTVT: 8
TZAT-0012FASTVT: 200 ms
TZAT-0013FASTVT: 2
TZAT-0018FASTVT: NEGATIVE
TZAT-0019FASTVT: 7.5 V
TZON-0003FASTVT: 350 ms
TZON-0004FASTVT: 12
TZON-0004SLOWVT: 24
TZON-0005SLOWVT: 6
TZON-0010FASTVT: 80 ms
TZST-0001FASTVT: 3
TZST-0001FASTVT: 4
TZST-0003FASTVT: 36 J
VENTRICULAR PACING ICD: 95 pct
VF: 0

## 2011-06-25 NOTE — Progress Notes (Signed)
ICD check 

## 2011-06-26 ENCOUNTER — Encounter: Payer: Self-pay | Admitting: Physical Medicine & Rehabilitation

## 2011-06-26 ENCOUNTER — Encounter: Payer: Medicare Other | Attending: Physical Medicine & Rehabilitation | Admitting: *Deleted

## 2011-06-26 ENCOUNTER — Encounter: Payer: Self-pay | Admitting: *Deleted

## 2011-06-26 VITALS — BP 107/64 | HR 101 | Resp 18 | Ht 64.0 in | Wt 124.0 lb

## 2011-06-26 DIAGNOSIS — G90529 Complex regional pain syndrome I of unspecified lower limb: Secondary | ICD-10-CM

## 2011-06-26 DIAGNOSIS — G500 Trigeminal neuralgia: Secondary | ICD-10-CM

## 2011-06-26 DIAGNOSIS — M961 Postlaminectomy syndrome, not elsewhere classified: Secondary | ICD-10-CM

## 2011-06-26 MED ORDER — OXYCODONE HCL 10 MG PO TABS: 10.0000 mg | ORAL_TABLET | Freq: Four times a day (QID) | ORAL | Status: DC | PRN

## 2011-06-26 MED ORDER — HYDROXYZINE HCL 25 MG PO TABS: 25.0000 mg | ORAL_TABLET | Freq: Three times a day (TID) | ORAL | Status: DC | PRN

## 2011-06-26 MED ORDER — HYDROXYZINE PAMOATE 25 MG PO CAPS
25.0000 mg | ORAL_CAPSULE | Freq: Three times a day (TID) | ORAL | Status: DC | PRN
Start: 2011-06-26 — End: 2011-07-31

## 2011-06-26 NOTE — Progress Notes (Signed)
States has been walking more and notes fairly severe pain in knees and shins. Using oxycodone on prn basis only. Does not use fentanyl patch. Using Vistaril for itching r/t oxycodone. No questions voiced for MD.

## 2011-07-28 ENCOUNTER — Telehealth: Payer: Self-pay | Admitting: Physical Medicine & Rehabilitation

## 2011-07-28 ENCOUNTER — Other Ambulatory Visit: Payer: Self-pay

## 2011-07-28 MED ORDER — OXYCODONE HCL 10 MG PO TABS: 10.0000 mg | ORAL_TABLET | Freq: Four times a day (QID) | ORAL | Status: DC | PRN

## 2011-07-28 NOTE — Telephone Encounter (Signed)
Needs refill on Oxycodone.  Only 3 left.

## 2011-07-31 ENCOUNTER — Other Ambulatory Visit: Payer: Self-pay | Admitting: Physical Medicine & Rehabilitation

## 2011-08-08 ENCOUNTER — Encounter: Payer: Medicare Other | Attending: Physical Medicine & Rehabilitation | Admitting: Physical Medicine & Rehabilitation

## 2011-08-08 ENCOUNTER — Encounter: Payer: Self-pay | Admitting: Physical Medicine & Rehabilitation

## 2011-08-08 VITALS — BP 97/53 | HR 86 | Ht 63.0 in | Wt 119.0 lb

## 2011-08-08 DIAGNOSIS — M1710 Unilateral primary osteoarthritis, unspecified knee: Secondary | ICD-10-CM

## 2011-08-08 DIAGNOSIS — G500 Trigeminal neuralgia: Secondary | ICD-10-CM

## 2011-08-08 DIAGNOSIS — IMO0002 Reserved for concepts with insufficient information to code with codable children: Secondary | ICD-10-CM

## 2011-08-08 DIAGNOSIS — M170 Bilateral primary osteoarthritis of knee: Secondary | ICD-10-CM | POA: Insufficient documentation

## 2011-08-08 MED ORDER — NORTRIPTYLINE HCL 50 MG PO CAPS: 100.0000 mg | ORAL_CAPSULE | Freq: Every day | ORAL | Status: DC

## 2011-08-08 MED ORDER — OXYCODONE HCL 10 MG PO TABS: 10.0000 mg | ORAL_TABLET | Freq: Four times a day (QID) | ORAL | Status: DC | PRN

## 2011-08-08 MED ORDER — OXYMORPHONE HCL ER 10 MG PO TB12: 10.0000 mg | ORAL_TABLET | Freq: Two times a day (BID) | ORAL | Status: DC

## 2011-08-08 NOTE — Progress Notes (Signed)
  Subjective:    Patient ID: [name], female    DOB: [birth year], 66 y.o.   MRN: [id number]  HPI  Spring is back regarding her multiple pain issues. She has lost continued weight, but she feels that she's been eating enough. In fact, she says she has had a big appetite. Her thyroid medicattion is bein adjusted by her medical team.    Her right face continues to be a big problem. She often feels depressed because of the pain as it inhibiits her from getting out of the house and doing some of the things she wants to do.  She couldn't tolerate the lidocaine gel because it was to painful to put in on her face.  She finds that the oxy IR does help her pain somewhat.   She noticed that the cool, damp weather has affected her knees.   Pain Inventory Average Pain 5 Pain Right Now 8 My pain is sharp, burning, stabbing and aching  In the last 24 hours, has pain interfered with the following? General activity 7 Relation with others 7 Enjoyment of life 7 What TIME of day is your pain at its worst? varies Sleep (in general) Good  Pain is worse with: bending and some activites Pain improves with: rest, heat/ice and medication Relief from Meds: 7  Mobility how many minutes can you walk? 1-2 min Do you have any goals in this area?  no  Function Do you have any goals in this area?  no  Neuro/Psych bladder control problems weakness confusion depression loss of taste or smell  Prior Studies Any changes since last visit?  no  Physicians involved in your care Any changes since last visit?  no       Review of Systems  Constitutional: Positive for unexpected weight change.  HENT: Negative.   Eyes: Negative.   Respiratory: Negative.   Cardiovascular: Negative.   Gastrointestinal: Positive for constipation.  Genitourinary: Negative.   Musculoskeletal: Negative.   Skin: Negative.   Neurological: Negative.   Hematological: Negative.   Psychiatric/Behavioral: Negative.          Objective:   Physical Exam  Constitutional: She is oriented to person, place, and time. She appears well-developed and well-nourished.       She has lost noticeable weight.  HENT:  Head: Normocephalic and atraumatic.  Eyes: Conjunctivae and EOM are normal. Pupils are equal, round, and reactive to light.  Neck: Normal range of motion. Neck supple.  Cardiovascular: Normal rate and regular rhythm.   Pulmonary/Chest: Effort normal. No respiratory distress. She has wheezes.  Abdominal: She exhibits no distension.  Neurological: She is alert and oriented to person, place, and time. No sensory deficit.          Assessment & Plan:  ASSESSMENT:  1. Right-sided trigeminal neuralgia.  2. Postlaminectomy syndrome.  3. Osteoarthritis of the knees.  4. Cardiomyopathy. 5. Weight loss   PLAN:  1. We will stay with gabapentin-if anything it would cause weight gain. Consider another anticonvulsant. 2. Increase pamelor to 100mg  qhs  3. We will use oxycodone 10 mg one q.6 h p.r.n., #100. She is using  typically 3 of these a day.  4. Will try a long agent for her pain. Will use opana ER 10mg  q12. I don't think the fentanyl was helping very much any more because of her weight loss..  5. I will see her back in about a month.

## 2011-09-05 ENCOUNTER — Other Ambulatory Visit: Payer: Self-pay | Admitting: Obstetrics and Gynecology

## 2011-09-05 DIAGNOSIS — N949 Unspecified condition associated with female genital organs and menstrual cycle: Secondary | ICD-10-CM

## 2011-09-09 ENCOUNTER — Ambulatory Visit: Payer: Medicare Other | Admitting: Physical Medicine & Rehabilitation

## 2011-09-10 ENCOUNTER — Encounter: Payer: Self-pay | Admitting: Physical Medicine and Rehabilitation

## 2011-09-10 ENCOUNTER — Encounter: Payer: Medicare Other | Attending: Physical Medicine & Rehabilitation | Admitting: Physical Medicine and Rehabilitation

## 2011-09-10 ENCOUNTER — Other Ambulatory Visit: Payer: Medicare Other

## 2011-09-10 VITALS — BP 97/73 | HR 86 | Resp 14 | Ht 62.0 in | Wt 120.0 lb

## 2011-09-10 DIAGNOSIS — I252 Old myocardial infarction: Secondary | ICD-10-CM | POA: Insufficient documentation

## 2011-09-10 DIAGNOSIS — I509 Heart failure, unspecified: Secondary | ICD-10-CM | POA: Insufficient documentation

## 2011-09-10 DIAGNOSIS — G500 Trigeminal neuralgia: Secondary | ICD-10-CM

## 2011-09-10 DIAGNOSIS — IMO0002 Reserved for concepts with insufficient information to code with codable children: Secondary | ICD-10-CM

## 2011-09-10 DIAGNOSIS — M170 Bilateral primary osteoarthritis of knee: Secondary | ICD-10-CM

## 2011-09-10 DIAGNOSIS — M1710 Unilateral primary osteoarthritis, unspecified knee: Secondary | ICD-10-CM

## 2011-09-10 DIAGNOSIS — R51 Headache: Secondary | ICD-10-CM | POA: Insufficient documentation

## 2011-09-10 MED ORDER — OXYMORPHONE HCL ER 10 MG PO TB12: 10.0000 mg | ORAL_TABLET | Freq: Two times a day (BID) | ORAL | Status: DC

## 2011-09-10 MED ORDER — OXYCODONE HCL 10 MG PO TABS: 10.0000 mg | ORAL_TABLET | Freq: Four times a day (QID) | ORAL | Status: DC | PRN

## 2011-09-10 NOTE — Patient Instructions (Signed)
Continue with medication. Continue with walking program.

## 2011-09-10 NOTE — Progress Notes (Signed)
Subjective:    Patient ID: [name], female    DOB: [birth year], 66 y.o.   MRN: [id number]  HPI The patient is a 66 year old female , who presents with pain right sided face pain . The symptoms started ago. The patient complains about moderate severe pain .  Patient also complains about numbness  In the same distribution . Covering face, applying heat, taking medications , changing positions alleviate the symptoms. Chewing, wind breeze, gentle touch aggrevates the symptoms. The patient grades his pain as a 7 /10.Patient has Hx of brain surgery, MI, CHF. Pain Inventory Average Pain 5 Pain Right Now 7 My pain is intermittent, sharp, burning, dull, stabbing and aching  In the last 24 hours, has pain interfered with the following? General activity 4 Relation with others 5 Enjoyment of life 8 What TIME of day is your pain at its worst? Depends Sleep (in general) Good  Pain is worse with: walking, bending and standing Pain improves with: rest, heat/ice and medication Relief from Meds: 7  Mobility walk without assistance how many minutes can you walk? Varies ability to climb steps?  yes do you drive?  yes  Function retired I need assistance with the following:  household duties  Neuro/Psych loss of taste or smell  Prior Studies Any changes since last visit?  no  Physicians involved in your care Any changes since last visit?  no   Family History  Problem Relation Age of Onset  . Heart disease Father    History   Social History  . Marital Status: Legally Separated    Spouse Name: N/A    Number of Children: N/A  . Years of Education: N/A   Social History Main Topics  . Smoking status: Former Smoker -- 0.5 packs/day for 30 years    Types: Cigarettes    Quit date: 03/14/2011  . Smokeless tobacco: Never Used  . Alcohol Use: No  . Drug Use: No  . Sexually Active: No   Other Topics Concern  . None   Social History Narrative  . None   Past Surgical History    Procedure Date  . Brain surgery     x3  . Appendectomy   . Joint replacement     thumbs and knees replacement  . Tubal ligation   . Back surgery     x2 back surgeries   Past Medical History  Diagnosis Date  . SOB (shortness of breath)   . CAD (coronary artery disease)     Prior LAD stenting with stent thrombosis occurring in a withdrawal phase of her Plavix undertaken because of trigeminal neuralgia and release surgery. It was treated medically.  . Anemia   . CHF (congestive heart failure)   . GERD (gastroesophageal reflux disease)   . Hyperlipidemia   . Osteoarthritis   . Ischemic cardiomyopathy   . Biventricular implantable cardiac defibrillator in situ     St. Jude implanted 2009  . Complication of anesthesia     massive heart attack  . Cancer     small bladder cancer removed no  treatment   BP 97/73  Pulse 86  Resp 14  Ht 5\' 2"  (1.575 m)  Wt 120 lb (54.432 kg)  BMI 21.95 kg/m2  SpO2 95%      Review of Systems  Constitutional: Negative.   HENT: Negative.   Eyes: Negative.   Respiratory: Negative.   Cardiovascular: Positive for leg swelling.  Gastrointestinal: Negative.   Genitourinary: Negative.  Musculoskeletal: Negative.   Skin: Negative.   Neurological: Negative.   Hematological: Negative.   Psychiatric/Behavioral: Negative.        Objective:   Physical Exam  Constitutional: She is oriented to person, place, and time. She appears well-developed.       Very skinny  HENT:  Head: Normocephalic.  Neck: Neck supple.  Musculoskeletal: She exhibits tenderness.  Neurological: She is alert and oriented to person, place, and time. A cranial nerve deficit is present.  Skin: Skin is warm and dry.  Psychiatric: She has a normal mood and affect.   Symmetric normal motor tone is noted throughout. Normal muscle bulk.Patient is very skinny. Muscle testing reveals 5/5 muscle strength of the upper extremity, and 5/5 of the lower extremity. Full range of  motion in upper and lower extremities. ROM of spine is  restricted. Fine motor movements are normal in both hands. Severe pain on gentle touch of right side of her face. DTR in the upper and lower extremity are present and symmetric 2+. No clonus is noted.  Patient arises from chair without difficulty. Narrow based gait with normal arm swing bilateral .       Assessment & Plan:  This is year old female female with 1. Trigeminal neuralgia 2. CHF 3. History of MI 4. History of brain surgery x3, and history of low back surgery Plan : Patient should continue with her medication, she also should continue with her walking program. And she showed continued to see her counselor. Oxycodone and Opana were refilled today.  Follow up in 1 month

## 2011-09-12 ENCOUNTER — Encounter: Payer: Self-pay | Admitting: *Deleted

## 2011-09-23 ENCOUNTER — Encounter: Payer: Medicare Other | Admitting: Internal Medicine

## 2011-09-25 ENCOUNTER — Other Ambulatory Visit: Payer: Self-pay | Admitting: Physical Medicine & Rehabilitation

## 2011-10-10 ENCOUNTER — Encounter: Payer: Self-pay | Admitting: Physical Medicine and Rehabilitation

## 2011-10-10 ENCOUNTER — Encounter: Payer: Medicare Other | Attending: Physical Medicine & Rehabilitation | Admitting: Physical Medicine and Rehabilitation

## 2011-10-10 VITALS — BP 99/58 | HR 92 | Resp 16 | Ht 62.0 in | Wt 117.6 lb

## 2011-10-10 DIAGNOSIS — M170 Bilateral primary osteoarthritis of knee: Secondary | ICD-10-CM

## 2011-10-10 DIAGNOSIS — K219 Gastro-esophageal reflux disease without esophagitis: Secondary | ICD-10-CM | POA: Insufficient documentation

## 2011-10-10 DIAGNOSIS — IMO0002 Reserved for concepts with insufficient information to code with codable children: Secondary | ICD-10-CM

## 2011-10-10 DIAGNOSIS — I252 Old myocardial infarction: Secondary | ICD-10-CM | POA: Insufficient documentation

## 2011-10-10 DIAGNOSIS — Z96659 Presence of unspecified artificial knee joint: Secondary | ICD-10-CM | POA: Insufficient documentation

## 2011-10-10 DIAGNOSIS — Z8551 Personal history of malignant neoplasm of bladder: Secondary | ICD-10-CM | POA: Insufficient documentation

## 2011-10-10 DIAGNOSIS — G500 Trigeminal neuralgia: Secondary | ICD-10-CM

## 2011-10-10 DIAGNOSIS — I509 Heart failure, unspecified: Secondary | ICD-10-CM | POA: Insufficient documentation

## 2011-10-10 DIAGNOSIS — I2510 Atherosclerotic heart disease of native coronary artery without angina pectoris: Secondary | ICD-10-CM | POA: Insufficient documentation

## 2011-10-10 DIAGNOSIS — M1710 Unilateral primary osteoarthritis, unspecified knee: Secondary | ICD-10-CM

## 2011-10-10 DIAGNOSIS — R209 Unspecified disturbances of skin sensation: Secondary | ICD-10-CM | POA: Insufficient documentation

## 2011-10-10 DIAGNOSIS — G8929 Other chronic pain: Secondary | ICD-10-CM | POA: Insufficient documentation

## 2011-10-10 DIAGNOSIS — M1990 Unspecified osteoarthritis, unspecified site: Secondary | ICD-10-CM | POA: Insufficient documentation

## 2011-10-10 DIAGNOSIS — E785 Hyperlipidemia, unspecified: Secondary | ICD-10-CM | POA: Insufficient documentation

## 2011-10-10 DIAGNOSIS — Z96698 Presence of other orthopedic joint implants: Secondary | ICD-10-CM | POA: Insufficient documentation

## 2011-10-10 MED ORDER — OXYCODONE HCL 10 MG PO TABS: 10.0000 mg | ORAL_TABLET | Freq: Four times a day (QID) | ORAL | Status: DC | PRN

## 2011-10-10 MED ORDER — OXYMORPHONE HCL ER 10 MG PO TB12: 10.0000 mg | ORAL_TABLET | Freq: Two times a day (BID) | ORAL | Status: DC

## 2011-10-10 NOTE — Patient Instructions (Signed)
Continue with walking

## 2011-10-10 NOTE — Progress Notes (Signed)
Subjective:    Patient ID: [name], female    DOB: 1946/01/22, 66 y.o.   MRN: [id number]  HPI The patient is a 66 year old female , who presents with chronic right sided face pain . The patient complains about moderate to severe pain . Patient also complains about numbness in the same distribution . Covering face, applying heat, taking medications , changing positions alleviate the symptoms. Chewing, wind breeze, gentle touch aggrevates the symptoms. The patient grades her pain as a 7 /10.Patient has Hx of brain surgery, MI, CHF. She reports, that she is in hosp[facility] care now for her CHF .  Pain Inventory Average Pain 4 Pain Right Now 2 My pain is intermittent, sharp, burning, dull and stabbing  In the last 24 hours, has pain interfered with the following? General activity 3 Relation with others 4 Enjoyment of life 5 What TIME of day is your pain at its worst? no particular time Sleep (in general) Good  Pain is worse with: bending and some activites Pain improves with: heat/ice Relief from Meds: 7  Mobility walk without assistance  Function disabled: date disabled   Neuro/Psych No problems in this area  Prior Studies Any changes since last visit?  no  Physicians involved in your care Any changes since last visit?  no   Family History  Problem Relation Age of Onset  . Heart disease Father    History   Social History  . Marital Status: Legally Separated    Spouse Name: N/A    Number of Children: N/A  . Years of Education: N/A   Social History Main Topics  . Smoking status: Former Smoker -- 0.5 packs/day for 30 years    Types: Cigarettes    Quit date: 03/14/2011  . Smokeless tobacco: Never Used  . Alcohol Use: No  . Drug Use: No  . Sexually Active: No   Other Topics Concern  . None   Social History Narrative  . None   Past Surgical History  Procedure Date  . Brain surgery     x3  . Appendectomy   . Joint replacement     thumbs and knees  replacement  . Tubal ligation   . Back surgery     x2 back surgeries   Past Medical History  Diagnosis Date  . SOB (shortness of breath)   . CAD (coronary artery disease)     Prior LAD stenting with stent thrombosis occurring in a withdrawal phase of her Plavix undertaken because of trigeminal neuralgia and release surgery. It was treated medically.  . Anemia   . CHF (congestive heart failure)   . GERD (gastroesophageal reflux disease)   . Hyperlipidemia   . Osteoarthritis   . Ischemic cardiomyopathy   . Biventricular implantable cardiac defibrillator in situ     St. Jude implanted 2009  . Complication of anesthesia     massive heart attack  . Cancer     small bladder cancer removed no  treatment   BP 99/58  Pulse 92  Resp 16  Ht 5\' 2"  (1.575 m)  Wt 117 lb 9.6 oz (53.343 kg)  BMI 21.51 kg/m2  SpO2 96%     Review of Systems  HENT:       Facial pain  All other systems reviewed and are negative.       Objective:   Physical Exam Constitutional: She is oriented to person, place, and time. She appears well-developed.  Very skinny  HENT:  Head:  Normocephalic.  Neck: Neck supple.  Musculoskeletal: She exhibits tenderness.  Neurological: She is alert and oriented to person, place, and time. A cranial nerve deficit is present.  Skin: Skin is warm and dry.  Psychiatric: She has a normal mood and affect.   Symmetric normal motor tone is noted throughout. Normal muscle bulk.Patient is very skinny. Muscle testing reveals 5/5 muscle strength of the upper extremity, and 5/5 of the lower extremity. Full range of motion in upper and lower extremities. ROM of spine is restricted. Fine motor movements are normal in both hands.  Severe pain on gentle touch of right side of her face.  DTR in the upper and lower extremity are present and symmetric 2+. No clonus is noted.  Patient arises from chair without difficulty. Narrow based gait with normal arm swing bilateral .          Assessment & Plan:  This is a 66 year old female with  1. Trigeminal neuralgia  2. CHF  3. History of MI  4. History of brain surgery x3, and history of low back surgery  Plan : Patient should continue with her medication, she also should continue with her walking program. And she should continued to see her counselor. Oxycodone and Opana were refilled today.  Follow up in 1 month

## 2011-11-10 ENCOUNTER — Encounter: Payer: Self-pay | Admitting: Physical Medicine and Rehabilitation

## 2011-11-10 ENCOUNTER — Encounter
Payer: Medicare Other | Attending: Physical Medicine and Rehabilitation | Admitting: Physical Medicine and Rehabilitation

## 2011-11-10 VITALS — BP 109/72 | HR 102 | Resp 14 | Ht 62.0 in | Wt 129.0 lb

## 2011-11-10 DIAGNOSIS — K219 Gastro-esophageal reflux disease without esophagitis: Secondary | ICD-10-CM | POA: Insufficient documentation

## 2011-11-10 DIAGNOSIS — I509 Heart failure, unspecified: Secondary | ICD-10-CM | POA: Insufficient documentation

## 2011-11-10 DIAGNOSIS — IMO0002 Reserved for concepts with insufficient information to code with codable children: Secondary | ICD-10-CM

## 2011-11-10 DIAGNOSIS — M1710 Unilateral primary osteoarthritis, unspecified knee: Secondary | ICD-10-CM

## 2011-11-10 DIAGNOSIS — Z96698 Presence of other orthopedic joint implants: Secondary | ICD-10-CM | POA: Insufficient documentation

## 2011-11-10 DIAGNOSIS — Z95810 Presence of automatic (implantable) cardiac defibrillator: Secondary | ICD-10-CM | POA: Insufficient documentation

## 2011-11-10 DIAGNOSIS — I2510 Atherosclerotic heart disease of native coronary artery without angina pectoris: Secondary | ICD-10-CM | POA: Insufficient documentation

## 2011-11-10 DIAGNOSIS — G500 Trigeminal neuralgia: Secondary | ICD-10-CM | POA: Insufficient documentation

## 2011-11-10 DIAGNOSIS — E785 Hyperlipidemia, unspecified: Secondary | ICD-10-CM | POA: Insufficient documentation

## 2011-11-10 DIAGNOSIS — F17200 Nicotine dependence, unspecified, uncomplicated: Secondary | ICD-10-CM | POA: Insufficient documentation

## 2011-11-10 DIAGNOSIS — Z8551 Personal history of malignant neoplasm of bladder: Secondary | ICD-10-CM | POA: Insufficient documentation

## 2011-11-10 DIAGNOSIS — Z96659 Presence of unspecified artificial knee joint: Secondary | ICD-10-CM | POA: Insufficient documentation

## 2011-11-10 DIAGNOSIS — R51 Headache: Secondary | ICD-10-CM | POA: Insufficient documentation

## 2011-11-10 DIAGNOSIS — I252 Old myocardial infarction: Secondary | ICD-10-CM | POA: Insufficient documentation

## 2011-11-10 DIAGNOSIS — G8929 Other chronic pain: Secondary | ICD-10-CM | POA: Insufficient documentation

## 2011-11-10 DIAGNOSIS — M170 Bilateral primary osteoarthritis of knee: Secondary | ICD-10-CM

## 2011-11-10 MED ORDER — OXYMORPHONE HCL ER 10 MG PO TB12: 10.0000 mg | ORAL_TABLET | Freq: Two times a day (BID) | ORAL | Status: DC

## 2011-11-10 MED ORDER — OXYCODONE HCL 10 MG PO TABS: 10.0000 mg | ORAL_TABLET | Freq: Four times a day (QID) | ORAL | Status: DC | PRN

## 2011-11-10 NOTE — Patient Instructions (Signed)
Continue to apply heating pad on your face, continue with your walking program.

## 2011-11-10 NOTE — Progress Notes (Signed)
Subjective:    Patient ID: [name], female    DOB: 04-Jan-1946, 66 y.o.   MRN: [id number]  HPI The patient is a 66 year old female , who presents with chronic right sided face pain . The patient complains about moderate to severe pain . Patient also complains about numbness in the same distribution . Covering face, applying heat, taking medications , changing positions alleviate the symptoms. Chewing, wind breeze, gentle touch aggrevates the symptoms. The patient grades her pain as a 7 /10.Patient has Hx of brain surgery, MI, CHF. She reports, that she is in hospitz care now for her CHF . The problem has been stable .  Pain Inventory Average Pain 5 Pain Right Now 5 My pain is sharp, burning, dull and stabbing  In the last 24 hours, has pain interfered with the following? General activity 5 Relation with others 5 Enjoyment of life 5 What TIME of day is your pain at its worst? evening Sleep (in general) Good  Pain is worse with: some activites Pain improves with: medication Relief from Meds: 7  Mobility walk without assistance  Function retired  Neuro/Psych weakness numbness  Prior Studies Any changes since last visit?  no  Physicians involved in your care Any changes since last visit?  no   Family History  Problem Relation Age of Onset  . Heart disease Father    History   Social History  . Marital Status: Legally Separated    Spouse Name: N/A    Number of Children: N/A  . Years of Education: N/A   Social History Main Topics  . Smoking status: Current Everyday Smoker -- 0.5 packs/day for 30 years    Types: Cigarettes    Last Attempt to Quit: 03/14/2011  . Smokeless tobacco: Never Used  . Alcohol Use: No  . Drug Use: No  . Sexually Active: No   Other Topics Concern  . None   Social History Narrative  . None   Past Surgical History  Procedure Date  . Brain surgery     x3  . Appendectomy   . Joint replacement     thumbs and knees replacement   . Tubal ligation   . Back surgery     x2 back surgeries   Past Medical History  Diagnosis Date  . SOB (shortness of breath)   . CAD (coronary artery disease)     Prior LAD stenting with stent thrombosis occurring in a withdrawal phase of her Plavix undertaken because of trigeminal neuralgia and release surgery. It was treated medically.  . Anemia   . CHF (congestive heart failure)   . GERD (gastroesophageal reflux disease)   . Hyperlipidemia   . Osteoarthritis   . Ischemic cardiomyopathy   . Biventricular implantable cardiac defibrillator in situ     St. Jude implanted 2009  . Complication of anesthesia     massive heart attack  . Cancer     small bladder cancer removed no  treatment   BP 109/72  Pulse 102  Resp 14  Ht 5\' 2"  (1.575 m)  Wt 129 lb (58.514 kg)  BMI 23.59 kg/m2  SpO2 99%      Review of Systems  HENT: Negative.   Eyes: Negative.   Respiratory: Negative.   Cardiovascular: Negative.   Gastrointestinal: Negative.   Genitourinary: Negative.   Musculoskeletal: Negative.   Skin: Negative.   Neurological: Positive for weakness and numbness.  Hematological: Negative.   Psychiatric/Behavioral: Negative.  Objective:   Physical Exam Constitutional: She is oriented to person, place, and time. She appears well-developed.  Very skinny  HENT:  Head: Normocephalic.  Neck: Neck supple.  Musculoskeletal: She exhibits tenderness.  Neurological: She is alert and oriented to person, place, and time. A cranial nerve deficit is present.  Skin: Skin is warm and dry.  Psychiatric: She has a normal mood and affect.  Symmetric normal motor tone is noted throughout. Normal muscle bulk.Patient is very skinny. Muscle testing reveals 5/5 muscle strength of the upper extremity, and 5/5 of the lower extremity. Full range of motion in upper and lower extremities. ROM of spine is restricted. Fine motor movements are normal in both hands.  Severe pain on gentle touch of  right side of her face.  DTR in the upper and lower extremity are present and symmetric 2+. No clonus is noted.  Patient arises from chair without difficulty. Narrow based gait with normal arm swing bilateral .        Assessment & Plan:  This is a 66 year old female with  1. Trigeminal neuralgia  2. CHF  3. History of MI  4. History of brain surgery x3, and history of low back surgery  Plan : Patient should continue with her medication, she also should continue with her walking program. And she should continued to see her counselor. Oxycodone and Opana were refilled today. She states, that she is under hospitz care for her heart, and that she will follow up with her cardiologist soon. Follow up in 1 month

## 2011-11-10 NOTE — Addendum Note (Signed)
Addended by: Caryl Ada on: 11/10/2011 02:10 PM   Modules accepted: Orders

## 2011-11-24 ENCOUNTER — Other Ambulatory Visit: Payer: Self-pay | Admitting: Physical Medicine & Rehabilitation

## 2011-12-08 ENCOUNTER — Encounter: Payer: Medicare Other | Admitting: Physical Medicine and Rehabilitation

## 2011-12-10 ENCOUNTER — Other Ambulatory Visit: Payer: Self-pay

## 2011-12-10 DIAGNOSIS — M170 Bilateral primary osteoarthritis of knee: Secondary | ICD-10-CM

## 2011-12-10 DIAGNOSIS — G500 Trigeminal neuralgia: Secondary | ICD-10-CM

## 2011-12-10 MED ORDER — OXYMORPHONE HCL ER 10 MG PO TB12: 10.0000 mg | ORAL_TABLET | Freq: Two times a day (BID) | ORAL | Status: DC

## 2011-12-11 ENCOUNTER — Telehealth: Payer: Self-pay | Admitting: Physical Medicine & Rehabilitation

## 2011-12-11 ENCOUNTER — Encounter: Payer: Self-pay | Admitting: Internal Medicine

## 2011-12-11 DIAGNOSIS — G500 Trigeminal neuralgia: Secondary | ICD-10-CM

## 2011-12-11 DIAGNOSIS — M170 Bilateral primary osteoarthritis of knee: Secondary | ICD-10-CM

## 2011-12-11 MED ORDER — OXYCODONE HCL 10 MG PO TABS: 10.0000 mg | ORAL_TABLET | Freq: Four times a day (QID) | ORAL | Status: DC | PRN

## 2011-12-11 NOTE — Telephone Encounter (Signed)
Rx is ready to be picked up, pt aware. 

## 2011-12-11 NOTE — Telephone Encounter (Signed)
Printed for Karen to sign 

## 2011-12-11 NOTE — Telephone Encounter (Signed)
Has been in accident.  Needs refill on Opana and Oxycodone.

## 2011-12-24 ENCOUNTER — Encounter
Payer: Medicare Other | Attending: Physical Medicine and Rehabilitation | Admitting: Physical Medicine and Rehabilitation

## 2011-12-29 ENCOUNTER — Encounter
Payer: Medicare Other | Attending: Physical Medicine and Rehabilitation | Admitting: Physical Medicine and Rehabilitation

## 2011-12-29 ENCOUNTER — Encounter: Payer: Self-pay | Admitting: Physical Medicine and Rehabilitation

## 2011-12-29 VITALS — BP 108/60 | HR 74 | Resp 14 | Ht 63.0 in | Wt 112.8 lb

## 2011-12-29 DIAGNOSIS — I2510 Atherosclerotic heart disease of native coronary artery without angina pectoris: Secondary | ICD-10-CM | POA: Insufficient documentation

## 2011-12-29 DIAGNOSIS — Z96698 Presence of other orthopedic joint implants: Secondary | ICD-10-CM | POA: Insufficient documentation

## 2011-12-29 DIAGNOSIS — M25561 Pain in right knee: Secondary | ICD-10-CM

## 2011-12-29 DIAGNOSIS — M25569 Pain in unspecified knee: Secondary | ICD-10-CM | POA: Insufficient documentation

## 2011-12-29 DIAGNOSIS — K219 Gastro-esophageal reflux disease without esophagitis: Secondary | ICD-10-CM | POA: Insufficient documentation

## 2011-12-29 DIAGNOSIS — I509 Heart failure, unspecified: Secondary | ICD-10-CM | POA: Insufficient documentation

## 2011-12-29 DIAGNOSIS — Z95810 Presence of automatic (implantable) cardiac defibrillator: Secondary | ICD-10-CM | POA: Insufficient documentation

## 2011-12-29 DIAGNOSIS — R51 Headache: Secondary | ICD-10-CM | POA: Insufficient documentation

## 2011-12-29 DIAGNOSIS — F17200 Nicotine dependence, unspecified, uncomplicated: Secondary | ICD-10-CM | POA: Insufficient documentation

## 2011-12-29 DIAGNOSIS — G500 Trigeminal neuralgia: Secondary | ICD-10-CM

## 2011-12-29 DIAGNOSIS — Z96659 Presence of unspecified artificial knee joint: Secondary | ICD-10-CM | POA: Insufficient documentation

## 2011-12-29 DIAGNOSIS — R209 Unspecified disturbances of skin sensation: Secondary | ICD-10-CM | POA: Insufficient documentation

## 2011-12-29 DIAGNOSIS — I252 Old myocardial infarction: Secondary | ICD-10-CM | POA: Insufficient documentation

## 2011-12-29 DIAGNOSIS — E785 Hyperlipidemia, unspecified: Secondary | ICD-10-CM | POA: Insufficient documentation

## 2011-12-29 DIAGNOSIS — M25562 Pain in left knee: Secondary | ICD-10-CM

## 2011-12-29 DIAGNOSIS — I2589 Other forms of chronic ischemic heart disease: Secondary | ICD-10-CM | POA: Insufficient documentation

## 2011-12-29 DIAGNOSIS — Z5181 Encounter for therapeutic drug level monitoring: Secondary | ICD-10-CM

## 2011-12-29 MED ORDER — HYDROXYZINE PAMOATE 25 MG PO CAPS: 25.0000 mg | ORAL_CAPSULE | Freq: Three times a day (TID) | ORAL | Status: DC | PRN

## 2011-12-29 MED ORDER — OXYCODONE HCL 10 MG PO TABS: 10.0000 mg | ORAL_TABLET | Freq: Four times a day (QID) | ORAL | Status: DC | PRN

## 2011-12-29 MED ORDER — OXYMORPHONE HCL ER 10 MG PO TB12: 10.0000 mg | ORAL_TABLET | Freq: Two times a day (BID) | ORAL | Status: DC

## 2011-12-29 NOTE — Progress Notes (Signed)
Subjective:    Patient ID: [name], female    DOB: September 04, 1945, 66 y.o.   MRN: [id number]  HPI The patient is a 66 year old female , who presents with chronic right sided face pain . The patient complains about moderate to severe pain . Patient also complains about numbness in the same distribution . Covering face, applying heat, taking medications , changing positions alleviate the symptoms. Chewing, wind breeze, gentle touch aggrevates the symptoms. The patient grades her pain as a 7 /10.Patient has Hx of brain surgery, MI, CHF. She reports, that she is in hospitz care now for her CHF .She also complains about knee pain , bilateral. The problem has been stable .  Pain Inventory Average Pain 5 Pain Right Now 5 My pain is sharp, burning, dull, stabbing and aching  In the last 24 hours, has pain interfered with the following? General activity 6 Relation with others 7 Enjoyment of life 8 What TIME of day is your pain at its worst? morning Sleep (in general) Good  Pain is worse with: walking, bending, sitting, standing and some activites Pain improves with: rest and medication Relief from Meds: 5  Mobility walk without assistance ability to climb steps?  yes do you drive?  yes  Function disabled: date disabled   Neuro/Psych weakness trouble walking depression loss of taste or smell  Prior Studies Any changes since last visit?  no  Physicians involved in your care Any changes since last visit?  no   Family History  Problem Relation Age of Onset  . Heart disease Father    History   Social History  . Marital Status: Legally Separated    Spouse Name: N/A    Number of Children: N/A  . Years of Education: N/A   Social History Main Topics  . Smoking status: Current Every Day Smoker -- 0.5 packs/day for 30 years    Types: Cigarettes    Last Attempt to Quit: 03/14/2011  . Smokeless tobacco: Never Used  . Alcohol Use: No  . Drug Use: No  . Sexually Active: No    Other Topics Concern  . None   Social History Narrative  . None   Past Surgical History  Procedure Date  . Brain surgery     x3  . Appendectomy   . Joint replacement     thumbs and knees replacement  . Tubal ligation   . Back surgery     x2 back surgeries   Past Medical History  Diagnosis Date  . SOB (shortness of breath)   . CAD (coronary artery disease)     Prior LAD stenting with stent thrombosis occurring in a withdrawal phase of her Plavix undertaken because of trigeminal neuralgia and release surgery. It was treated medically.  . Anemia   . CHF (congestive heart failure)   . GERD (gastroesophageal reflux disease)   . Hyperlipidemia   . Osteoarthritis   . Ischemic cardiomyopathy   . Biventricular implantable cardiac defibrillator in situ     St. Jude implanted 2009  . Complication of anesthesia     massive heart attack  . Cancer     small bladder cancer removed no  treatment   BP 108/60  Pulse 74  Resp 14  Ht 5\' 3"  (1.6 m)  Wt 112 lb 12.8 oz (51.166 kg)  BMI 19.98 kg/m2  SpO2 100%    Review of Systems  Musculoskeletal: Positive for arthralgias and gait problem.  Psychiatric/Behavioral: Positive for dysphoric mood.  All other systems reviewed and are negative.       Objective:   Physical Exam Constitutional: She is oriented to person, place, and time. She appears well-developed.  Very skinny  HENT:  Head: Normocephalic.  Neck: Neck supple.  Musculoskeletal: She exhibits tenderness.  Neurological: She is alert and oriented to person, place, and time. A cranial nerve deficit is present.  Skin: Skin is warm and dry.  Psychiatric: She has a normal mood and affect. Patient seems to be a little slow today. Symmetric normal motor tone is noted throughout. Normal muscle bulk.Patient is very skinny. Muscle testing reveals 5/5 muscle strength of the upper extremity, and 5/5 of the lower extremity. Full range of motion in upper and lower extremities. ROM  of spine is restricted. Fine motor movements are normal in both hands.  Severe pain on gentle touch of right side of her face.  DTR in the upper and lower extremity are present and symmetric 2+. No clonus is noted.  Patient arises from chair without difficulty. Narrow based gait with normal arm swing bilateral .        Assessment & Plan:  This is a 66 year old female with  1. Trigeminal neuralgia  2. CHF  3. History of MI  4. History of brain surgery x3, and history of low back surgery  Plan : Patient should continue with her medication, she also should continue with her walking program. And she should continued to see her counselor. Oxycodone and Opana were refilled today. She states, that she is under hospitz care for her heart, and that she will follow up with her cardiologist in about 2 weeks. UDS ordered today. Follow up in 1 month

## 2011-12-29 NOTE — Patient Instructions (Signed)
Continue with your walking program. 

## 2012-01-08 ENCOUNTER — Encounter: Payer: Medicare Other | Admitting: Internal Medicine

## 2012-01-12 ENCOUNTER — Other Ambulatory Visit: Payer: Self-pay | Admitting: Physical Medicine & Rehabilitation

## 2012-01-19 ENCOUNTER — Other Ambulatory Visit: Payer: Self-pay | Admitting: Endocrinology

## 2012-01-19 ENCOUNTER — Encounter: Payer: Medicare Other | Admitting: Internal Medicine

## 2012-01-19 DIAGNOSIS — E0590 Thyrotoxicosis, unspecified without thyrotoxic crisis or storm: Secondary | ICD-10-CM

## 2012-01-26 ENCOUNTER — Encounter
Payer: Medicare Other | Attending: Physical Medicine and Rehabilitation | Admitting: Physical Medicine and Rehabilitation

## 2012-01-26 ENCOUNTER — Encounter: Payer: Self-pay | Admitting: Physical Medicine and Rehabilitation

## 2012-01-26 VITALS — BP 103/56 | HR 80 | Resp 14 | Ht 62.0 in | Wt 114.0 lb

## 2012-01-26 DIAGNOSIS — I2510 Atherosclerotic heart disease of native coronary artery without angina pectoris: Secondary | ICD-10-CM | POA: Insufficient documentation

## 2012-01-26 DIAGNOSIS — E785 Hyperlipidemia, unspecified: Secondary | ICD-10-CM | POA: Insufficient documentation

## 2012-01-26 DIAGNOSIS — F17200 Nicotine dependence, unspecified, uncomplicated: Secondary | ICD-10-CM | POA: Insufficient documentation

## 2012-01-26 DIAGNOSIS — I252 Old myocardial infarction: Secondary | ICD-10-CM | POA: Insufficient documentation

## 2012-01-26 DIAGNOSIS — Z96659 Presence of unspecified artificial knee joint: Secondary | ICD-10-CM | POA: Insufficient documentation

## 2012-01-26 DIAGNOSIS — Z95810 Presence of automatic (implantable) cardiac defibrillator: Secondary | ICD-10-CM | POA: Insufficient documentation

## 2012-01-26 DIAGNOSIS — Z96698 Presence of other orthopedic joint implants: Secondary | ICD-10-CM | POA: Insufficient documentation

## 2012-01-26 DIAGNOSIS — I509 Heart failure, unspecified: Secondary | ICD-10-CM | POA: Insufficient documentation

## 2012-01-26 DIAGNOSIS — K219 Gastro-esophageal reflux disease without esophagitis: Secondary | ICD-10-CM | POA: Insufficient documentation

## 2012-01-26 DIAGNOSIS — G500 Trigeminal neuralgia: Secondary | ICD-10-CM | POA: Insufficient documentation

## 2012-01-26 MED ORDER — PROMETHAZINE HCL 25 MG PO TABS: 25.0000 mg | ORAL_TABLET | Freq: Four times a day (QID) | ORAL | Status: DC | PRN

## 2012-01-26 MED ORDER — OXYMORPHONE HCL ER 10 MG PO TB12: 10.0000 mg | ORAL_TABLET | Freq: Two times a day (BID) | ORAL | Status: DC

## 2012-01-26 MED ORDER — OXYCODONE HCL 10 MG PO TABS: 10.0000 mg | ORAL_TABLET | Freq: Four times a day (QID) | ORAL | Status: DC | PRN

## 2012-01-26 NOTE — Progress Notes (Signed)
Subjective:    Patient ID: [name], female    DOB: February 26, 1946, 66 y.o.   MRN: [id number]  HPI The patient is a 66 year old female , who presents with chronic right sided face pain . The patient complains about moderate to severe pain . Patient also complains about numbness in the same distribution . Covering face, applying heat, taking medications , changing positions alleviate the symptoms. Chewing, wind breeze, gentle touch aggrevates the symptoms. The patient grades her pain as a 7 /10.Patient has Hx of brain surgery, MI, CHF .She also complains about knee pain , bilateral. The problem has been stable .She reports, that she is following up with her psychiatrist today.  Pain Inventory Average Pain 6 Pain Right Now 2 My pain is intermittent  In the last 24 hours, has pain interfered with the following? General activity 3 Relation with others 4 Enjoyment of life 5 What TIME of day is your pain at its worst? morning Sleep (in general) Good  Pain is worse with: bending and some activites Pain improves with: rest, heat/ice and medication Relief from Meds: 7  Mobility walk without assistance how many minutes can you walk? varies ability to climb steps?  yes do you drive?  yes Do you have any goals in this area?  no  Function retired I need assistance with the following:  household duties  Neuro/Psych weakness depression anxiety loss of taste or smell  Prior Studies Any changes since last visit?  no  Physicians involved in your care Any changes since last visit?  no   Family History  Problem Relation Age of Onset  . Heart disease Father    History   Social History  . Marital Status: Legally Separated    Spouse Name: N/A    Number of Children: N/A  . Years of Education: N/A   Social History Main Topics  . Smoking status: Current Every Day Smoker -- 0.5 packs/day for 30 years    Types: Cigarettes    Last Attempt to Quit: 03/14/2011  . Smokeless  tobacco: Never Used  . Alcohol Use: No  . Drug Use: No  . Sexually Active: No   Other Topics Concern  . None   Social History Narrative  . None   Past Surgical History  Procedure Date  . Brain surgery     x3  . Appendectomy   . Joint replacement     thumbs and knees replacement  . Tubal ligation   . Back surgery     x2 back surgeries   Past Medical History  Diagnosis Date  . SOB (shortness of breath)   . CAD (coronary artery disease)     Prior LAD stenting with stent thrombosis occurring in a withdrawal phase of her Plavix undertaken because of trigeminal neuralgia and release surgery. It was treated medically.  . Anemia   . CHF (congestive heart failure)   . GERD (gastroesophageal reflux disease)   . Hyperlipidemia   . Osteoarthritis   . Ischemic cardiomyopathy   . Biventricular implantable cardiac defibrillator in situ     St. Jude implanted 2009  . Complication of anesthesia     massive heart attack  . Cancer     small bladder cancer removed no  treatment   BP 103/56  Pulse 80  Resp 14  Ht 5\' 2"  (1.575 m)  Wt 114 lb (51.71 kg)  BMI 20.85 kg/m2  SpO2 95%     Review of Systems  Musculoskeletal:  Positive for myalgias and arthralgias.  Neurological: Positive for weakness.  Psychiatric/Behavioral: Positive for dysphoric mood. The patient is nervous/anxious.   All other systems reviewed and are negative.       Objective:   Physical Exam Constitutional: She is oriented to person, place, and time. She appears well-developed.  Very skinny  HENT:  Head: Normocephalic.  Neck: Neck supple.  Musculoskeletal: She exhibits tenderness.  Neurological: She is alert and oriented to person, place, and time. A cranial nerve deficit is present.  Skin: Skin is warm and dry.  Psychiatric: She has a normal mood and affect. Patient seems to be a little slow today.  Symmetric normal motor tone is noted throughout. Normal muscle bulk.Patient is very skinny. Muscle  testing reveals 5/5 muscle strength of the upper extremity, and 5/5 of the lower extremity. Full range of motion in upper and lower extremities. ROM of spine is restricted. Fine motor movements are normal in both hands.  Severe pain on gentle touch of right side of her face.  DTR in the upper and lower extremity are present and symmetric 2+. No clonus is noted.  Patient arises from chair without difficulty. Narrow based gait with normal arm swing bilateral .        Assessment & Plan:  This is a 66 year old female with  1. Trigeminal neuralgia  2. CHF  3. History of MI  4. History of brain surgery x3, and history of low back surgery  Plan : Patient should continue with her medication, she also should continue with her walking program. And she should continued to see her counselor. Oxycodone and Opana were refilled today.  Follow up in 1 month

## 2012-01-26 NOTE — Patient Instructions (Signed)
Try to stay as active as tolerated 

## 2012-01-28 ENCOUNTER — Encounter (HOSPITAL_COMMUNITY)
Admission: RE | Admit: 2012-01-28 | Discharge: 2012-01-28 | Disposition: A | Discharge status: Home / Self Care | Payer: Medicare Other | Source: Ambulatory Visit | Attending: Endocrinology | Admitting: Endocrinology

## 2012-01-28 DIAGNOSIS — E0590 Thyrotoxicosis, unspecified without thyrotoxic crisis or storm: Secondary | ICD-10-CM | POA: Insufficient documentation

## 2012-01-29 ENCOUNTER — Encounter (HOSPITAL_COMMUNITY)
Admission: RE | Admit: 2012-01-29 | Discharge: 2012-01-29 | Disposition: A | Discharge status: Home / Self Care | Payer: Medicare Other | Source: Ambulatory Visit | Attending: Endocrinology | Admitting: Endocrinology

## 2012-01-29 MED ORDER — SODIUM IODIDE I 131 CAPSULE
10.0000 | Freq: Once | INTRAVENOUS | Status: AC | PRN
  Administered 2012-01-29: 10 via ORAL

## 2012-02-03 ENCOUNTER — Other Ambulatory Visit: Payer: Self-pay | Admitting: Endocrinology

## 2012-02-03 DIAGNOSIS — E0590 Thyrotoxicosis, unspecified without thyrotoxic crisis or storm: Secondary | ICD-10-CM

## 2012-02-09 ENCOUNTER — Telehealth: Payer: Self-pay | Admitting: Physical Medicine & Rehabilitation

## 2012-02-09 NOTE — Telephone Encounter (Signed)
Please advise 

## 2012-02-09 NOTE — Telephone Encounter (Signed)
Does not have enough meds to get to the 16[facility].  Has 5 oxycodone left.

## 2012-02-09 NOTE — Telephone Encounter (Signed)
She always gets this same dosage for one month, basically 3 tablets a day, and 10 extra for worse days.  She should know, she is taking that at least since March/April.Will not fill before due

## 2012-02-10 NOTE — Telephone Encounter (Signed)
[name] has taken her last oxycodone today. Her prescription has on it hand written not to fill until 02/14/12. She has been on this dose for a long period of time and it is not working any longer for the amount of pain that she is in.  Should we bring her in to see you tomorrow?

## 2012-02-11 NOTE — Telephone Encounter (Signed)
Patient called back wanting to talk to karen.

## 2012-02-11 NOTE — Telephone Encounter (Signed)
She HAS to take meds as prescribed, there were difficulties with her in the past, with taking more , or even taking meds we did not prescribe. Ask her whether she had more relief with LYrica than gabapentin, we might switch if that is the casr. 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 I will NOT fill early.

## 2012-02-11 NOTE — Telephone Encounter (Signed)
Spoke to patient on the phone, advised her to get Arnica cream for her arthritic pain in her hands and knees, patient has cardio issues, therefore I did not prescribe NSAIDs, also suggested Ginger tea, 100 % for some pain relief, until she can fill her prescription. Told her if those treatments do not give her relief, or if it gets worse she could call us again, or if it is not in our office hrs. Could go to the ED.

## 2012-02-11 NOTE — Telephone Encounter (Signed)
Clydie Braun spoke with patient about how to take her medication.

## 2012-02-19 ENCOUNTER — Encounter (HOSPITAL_COMMUNITY)
Admission: RE | Admit: 2012-02-19 | Discharge: 2012-02-19 | Disposition: A | Discharge status: Home / Self Care | Payer: Medicare Other | Source: Ambulatory Visit | Attending: Endocrinology | Admitting: Endocrinology

## 2012-02-19 DIAGNOSIS — E0590 Thyrotoxicosis, unspecified without thyrotoxic crisis or storm: Secondary | ICD-10-CM | POA: Insufficient documentation

## 2012-02-19 MED ORDER — SODIUM IODIDE I 131 CAPSULE
21.0000 | Freq: Once | INTRAVENOUS | Status: AC | PRN
  Administered 2012-02-19: 21 via ORAL

## 2012-02-24 ENCOUNTER — Encounter: Payer: Medicare Other | Admitting: Physical Medicine and Rehabilitation

## 2012-03-09 ENCOUNTER — Encounter: Payer: Medicare Other | Admitting: Internal Medicine

## 2012-03-10 ENCOUNTER — Encounter: Payer: Medicare Other | Attending: Physical Medicine & Rehabilitation | Admitting: Physical Medicine & Rehabilitation

## 2012-03-10 ENCOUNTER — Encounter: Payer: Self-pay | Admitting: Physical Medicine & Rehabilitation

## 2012-03-10 VITALS — BP 98/69 | HR 88 | Resp 14 | Ht 62.0 in | Wt 120.0 lb

## 2012-03-10 DIAGNOSIS — G500 Trigeminal neuralgia: Secondary | ICD-10-CM | POA: Insufficient documentation

## 2012-03-10 DIAGNOSIS — IMO0002 Reserved for concepts with insufficient information to code with codable children: Secondary | ICD-10-CM | POA: Insufficient documentation

## 2012-03-10 DIAGNOSIS — M1710 Unilateral primary osteoarthritis, unspecified knee: Secondary | ICD-10-CM

## 2012-03-10 DIAGNOSIS — M170 Bilateral primary osteoarthritis of knee: Secondary | ICD-10-CM

## 2012-03-10 DIAGNOSIS — Z5181 Encounter for therapeutic drug level monitoring: Secondary | ICD-10-CM

## 2012-03-10 MED ORDER — OXYCODONE-ACETAMINOPHEN 10-325 MG PO TABS: 1.0000 | ORAL_TABLET | Freq: Four times a day (QID) | ORAL | Status: DC | PRN

## 2012-03-10 NOTE — Patient Instructions (Signed)
CALL us WITH ANY QUESTIONS REGARDING YOUR MEDICATIONS.
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
## 2012-03-11 ENCOUNTER — Encounter: Payer: Self-pay | Admitting: Internal Medicine

## 2012-03-11 ENCOUNTER — Telehealth: Payer: Self-pay

## 2012-03-11 NOTE — Telephone Encounter (Signed)
i am not surprised. Something was definitely going on. Did she ever provide a sample for the uds?

## 2012-03-11 NOTE — Telephone Encounter (Signed)
Marchelle Folks at [facility] called to make [place] aware patient has had multiple prescribers recently.

## 2012-03-12 NOTE — Telephone Encounter (Signed)
She did provide UDS, results not back, but [place] Controlled substance report does NOT show multiple providers. PCP provides [place], but narcs have been from you, [place] or Clydie Braun.

## 2012-03-15 ENCOUNTER — Encounter: Payer: Self-pay | Admitting: Internal Medicine

## 2012-03-17 ENCOUNTER — Telehealth: Payer: Self-pay

## 2012-03-17 NOTE — Telephone Encounter (Signed)
Left message for patient to call office regarding inconsistent urine drug screen.  Per Dr Riley Kill he will no long treat with narcotics.  Will advise patient when we speak with her.

## 2012-03-25 ENCOUNTER — Encounter: Payer: Self-pay | Admitting: *Deleted

## 2012-03-26 ENCOUNTER — Telehealth: Payer: Self-pay | Admitting: *Deleted

## 2012-03-26 NOTE — Telephone Encounter (Signed)
Patient informed of inconsistent urine drug screen.  She says her psychiatrist gives her these medications.  She will discuss this with Dr Riley Kill at her next visit.

## 2012-03-26 NOTE — Telephone Encounter (Signed)
Returning call from [place].

## 2012-03-29 ENCOUNTER — Telehealth: Payer: Self-pay | Admitting: *Deleted

## 2012-03-29 MED ORDER — TRAMADOL HCL 50 MG PO TABS: 50.0000 mg | ORAL_TABLET | Freq: Four times a day (QID) | ORAL | Status: DC | PRN

## 2012-03-29 NOTE — Telephone Encounter (Signed)
Can offer tramadol. Nothing more

## 2012-03-29 NOTE — Telephone Encounter (Signed)
Patient called again to check on status of Oxycodone Prescription. Notified patient her UDS was inconsistent. Notified her we could call in some Tramadol for her. Patient is fine with that. Also wanted me to tell Dr. Riley Kill that she is not sure why UDS came back inconsistent. Everything she is taking is on her medication list that was printed out during last OV. Please advise directions for Tramadol

## 2012-03-29 NOTE — Telephone Encounter (Signed)
Tramadol sent into pharmacy per order.

## 2012-03-29 NOTE — Telephone Encounter (Signed)
Patient requesting refill on Oxycodone. UDS was inconsistent. Patient is aware she is non-narcotic. Please advise.

## 2012-03-29 NOTE — Telephone Encounter (Signed)
50mg  q 6 prn #60, 1 RF

## 2012-03-30 ENCOUNTER — Telehealth: Payer: Self-pay | Admitting: *Deleted

## 2012-03-30 NOTE — Telephone Encounter (Signed)
I called and spoke with [name] to follow up on her conversation with our practice manager Aurelio Jew about her urine drug screen and her medications.  I reviewed the results and her prescriptions. Oxymorphone at issue, because she has had a prescription for opana in the recent past and it is also a metabolite of oxycodone as well, and either could explain the positive presence of oxymorphone. Fentanyl being present is not a medication she is currently being prescribed, but she did admit to "finding an old patch in a drawer and putting it on because she was out of her oxycodone".  I explained that she should not take "old" narcotics when she is out of her current medications. She stated that she had run out of her oxycodone because she was in so much pain she was having to take it every 4 hours instead of the way it was being prescribed. I discussed that it is unsafe for her to take medication more often than is  prescribed and that in itself is a violation. She must take her medicine as written.  She stated she understood. I told her that Dr Riley Kill is not basing his treatment plan on her UDS but other concerns he has about her condition and safety.  She is now taking tramadol and I explained if she was to begin feeling like she is going through withdrawal from the narcotics she would need to seek treatment in the ER. She does not feel like that will be an issue, but said she understood. She wants to keep a good relationship with Dr Riley Kill and will see him in January at her appointment on the 13 th and hope that things can be 'sorted out'.

## 2012-04-12 ENCOUNTER — Encounter: Payer: Medicare Other | Attending: Physical Medicine & Rehabilitation | Admitting: Physical Medicine & Rehabilitation

## 2012-04-12 ENCOUNTER — Encounter: Payer: Self-pay | Admitting: Physical Medicine & Rehabilitation

## 2012-04-12 VITALS — BP 129/89 | HR 86 | Resp 14 | Ht 62.0 in | Wt 130.0 lb

## 2012-04-12 DIAGNOSIS — M170 Bilateral primary osteoarthritis of knee: Secondary | ICD-10-CM

## 2012-04-12 DIAGNOSIS — M1990 Unspecified osteoarthritis, unspecified site: Secondary | ICD-10-CM

## 2012-04-12 DIAGNOSIS — G500 Trigeminal neuralgia: Secondary | ICD-10-CM | POA: Insufficient documentation

## 2012-04-12 DIAGNOSIS — M1710 Unilateral primary osteoarthritis, unspecified knee: Secondary | ICD-10-CM | POA: Insufficient documentation

## 2012-04-12 DIAGNOSIS — IMO0002 Reserved for concepts with insufficient information to code with codable children: Secondary | ICD-10-CM | POA: Insufficient documentation

## 2012-04-12 MED ORDER — OXCARBAZEPINE 150 MG PO TABS: 150.0000 mg | ORAL_TABLET | Freq: Two times a day (BID) | ORAL | Status: DC

## 2012-04-12 MED ORDER — OXYCODONE-ACETAMINOPHEN 10-325 MG PO TABS: 1.0000 | ORAL_TABLET | Freq: Four times a day (QID) | ORAL | Status: DC | PRN

## 2012-04-12 NOTE — Progress Notes (Signed)
Subjective:    Patient ID: [name], female    DOB: October 01, 1945, 67 y.o.   MRN: [id number]  HPI  [name] is back regarding her chronic facial pain. We had held off on further opana last time given her inconsistent presentation. She apologized today and stated she hadn't slept. She only took her fentanyl patch because she had been out of her other medications and the pain had increased so much. Her UDS was essentially unremarkable other than the fentanyl. She had the oxazepam which would be expected.   She is going to [facility] for a brain stimulator to help with her pain. She is currently in the evaluation phase. She is taking the neurontin as prescri bed. She is no longer using the pamelor as it caused her to sleep walk and to have vivid dreams.  Pain Inventory Average Pain 8 Pain Right Now 7 My pain is intermittent  In the last 24 hours, has pain interfered with the following? General activity 8 Relation with others 9 Enjoyment of life 10 What TIME of day is your pain at its worst? varies Sleep (in general) Fair  Pain is worse with: bending Pain improves with: medication Relief from Meds: did not answer  Mobility walk without assistance Do you have any goals in this area?  no  Function retired Do you have any goals in this area?  no  Neuro/Psych bowel control problems depression loss of taste or smell suicidal thoughts  Prior Studies Any changes since last visit?  no  Physicians involved in your care Any changes since last visit?  no   Family History  Problem Relation Age of Onset  . Heart disease Father    History   Social History  . Marital Status: Legally Separated    Spouse Name: N/A    Number of Children: N/A  . Years of Education: N/A   Social History Main Topics  . Smoking status: Current Every Day Smoker -- 0.5 packs/day for 30 years    Types: Cigarettes    Last Attempt to Quit: 03/14/2011  . Smokeless tobacco: Never Used  . Alcohol Use: No    . Drug Use: No  . Sexually Active: No   Other Topics Concern  . None   Social History Narrative  . None   Past Surgical History  Procedure Date  . Brain surgery     x3  . Appendectomy   . Joint replacement     thumbs and knees replacement  . Tubal ligation   . Back surgery     x2 back surgeries   Past Medical History  Diagnosis Date  . SOB (shortness of breath)   . CAD (coronary artery disease)     Prior LAD stenting with stent thrombosis occurring in a withdrawal phase of her Plavix undertaken because of trigeminal neuralgia and release surgery. It was treated medically.  . Anemia   . CHF (congestive heart failure)   . GERD (gastroesophageal reflux disease)   . Hyperlipidemia   . Osteoarthritis   . Ischemic cardiomyopathy   . Biventricular implantable cardiac defibrillator in situ     St. Jude implanted 2009  . Complication of anesthesia     massive heart attack  . Cancer     small bladder cancer removed no  treatment   BP 129/89  Pulse 86  Resp 14  Ht 5\' 2"  (1.575 m)  Wt 130 lb (58.968 kg)  BMI 23.78 kg/m2  SpO2 98%  Review of Systems  Psychiatric/Behavioral: Positive for suicidal ideas and dysphoric mood.  All other systems reviewed and are negative.       Objective:   Physical Exam  Constitutional: She is oriented to person, place, and time. She appears well-developed and well-nourished.  More alert today. HENT:  Head: Normocephalic and atraumatic.  Eyes: Conjunctivae and EOM are normal. Pupils are equal, round, and reactive to light.  Neck: Normal range of motion. Neck supple.  Cardiovascular: Normal rate and regular rhythm.  Pulmonary/Chest: Effort normal. No respiratory distress.  Abdominal: She exhibits no distension.  Neurological:  Balance better. Continued facial hypersensitivity on the right.  Strength near 5/5. Sensation grossly in tact in the limbs. Musculoskeletal: Right foot notable for bruising over the proximal 4th toe. Hands  and toes are generally tender to touch and show chronic arthritic signs/ sclerosis.  Psych: as a whole much more relaxed. Less anxious, but still tangential and difficult to redrect at times.  Assessment & Plan:   ASSESSMENT:  1. Right-sided trigeminal neuralgia.  2. Postlaminectomy syndrome.  3. Osteoarthritis of the knees.  4. Cardiomyopathy.  5. Weight loss   PLAN:  1. Will try percocet 10/325 for breakthrough pain. Will stay away from a long acting narcotic given the safey factors involved. Her pain is often episodic as well. 2 .Will try low dose trileptal to assista further with neuropathic pain 3. I discussed the fact that she MUST BRING IN ALL MEDS AT [facility] OFFICE VISIT HERE TO INSURE HER SAFETY. 5. My pA will see her back in about a month.  6.To [facility] for brain stimulator. 30 minutes of face to face patient care time were spent during this visit. All questions were encouraged and answered.

## 2012-04-12 NOTE — Patient Instructions (Signed)
YOU MUST BRING ALL OF YOUR MEDS WITH YOU TO EACH AND EVERY VISIT HERE AT OUR OFFICE.

## 2012-04-14 ENCOUNTER — Encounter: Payer: Self-pay | Admitting: Internal Medicine

## 2012-05-12 ENCOUNTER — Encounter: Payer: Self-pay | Admitting: Physical Medicine and Rehabilitation

## 2012-05-12 ENCOUNTER — Encounter
Payer: Medicare Other | Attending: Physical Medicine and Rehabilitation | Admitting: Physical Medicine and Rehabilitation

## 2012-05-12 VITALS — BP 116/50 | HR 94 | Resp 14 | Wt 142.6 lb

## 2012-05-12 DIAGNOSIS — M961 Postlaminectomy syndrome, not elsewhere classified: Secondary | ICD-10-CM | POA: Insufficient documentation

## 2012-05-12 DIAGNOSIS — R634 Abnormal weight loss: Secondary | ICD-10-CM | POA: Insufficient documentation

## 2012-05-12 DIAGNOSIS — I428 Other cardiomyopathies: Secondary | ICD-10-CM | POA: Insufficient documentation

## 2012-05-12 DIAGNOSIS — M1710 Unilateral primary osteoarthritis, unspecified knee: Secondary | ICD-10-CM | POA: Insufficient documentation

## 2012-05-12 DIAGNOSIS — IMO0002 Reserved for concepts with insufficient information to code with codable children: Secondary | ICD-10-CM

## 2012-05-12 DIAGNOSIS — G8929 Other chronic pain: Secondary | ICD-10-CM | POA: Insufficient documentation

## 2012-05-12 DIAGNOSIS — G500 Trigeminal neuralgia: Secondary | ICD-10-CM

## 2012-05-12 DIAGNOSIS — M170 Bilateral primary osteoarthritis of knee: Secondary | ICD-10-CM

## 2012-05-12 DIAGNOSIS — R51 Headache: Secondary | ICD-10-CM | POA: Insufficient documentation

## 2012-05-12 MED ORDER — OXYCODONE-ACETAMINOPHEN 10-325 MG PO TABS: 1.0000 | ORAL_TABLET | Freq: Four times a day (QID) | ORAL | Status: DC | PRN

## 2012-05-12 NOTE — Progress Notes (Signed)
Subjective:    Patient ID: [name], female    DOB: [birth year], 67 y.o.   MRN: [id number]  HPI The patient is a 67 year old female , who presents with chronic right sided face pain . The patient complains about moderate to severe pain . Patient also complains about numbness in the same distribution . Covering face, applying heat, taking medications , changing positions alleviate the symptoms. Chewing, wind breeze, gentle touch aggrevates the symptoms. The patient grades her pain as a 2 /10.Patient has Hx of brain surgery, MI, CHF .She also complains about knee pain , bilateral. The problem has improved, she states, that she had the best 30 days concerning her pain, since a long time.   Pain Inventory Average Pain 2 Pain Right Now 2 My pain is intermittent, sharp, burning and stabbing  In the last 24 hours, has pain interfered with the following? General activity 2 Relation with others 2 Enjoyment of life 4 What TIME of day is your pain at its worst? morning Sleep (in general) Good  Pain is worse with: bending Pain improves with: medication Relief from Meds: 8  Mobility walk without assistance ability to climb steps?  yes do you drive?  yes  Function retired I need assistance with the following:  household duties  Neuro/Psych weakness depression loss of taste or smell  Prior Studies Any changes since last visit?  no  Physicians involved in your care Any changes since last visit?  no   Family History  Problem Relation Age of Onset  . Heart disease Father    History   Social History  . Marital Status: Legally Separated    Spouse Name: N/A    Number of Children: N/A  . Years of Education: N/A   Social History Main Topics  . Smoking status: Current Every Day Smoker -- 0.50 packs/day for 30 years    Types: Cigarettes    Last Attempt to Quit: 03/14/2011  . Smokeless tobacco: Never Used  . Alcohol Use: No  . Drug Use: No  . Sexually Active: No   Other  Topics Concern  . None   Social History Narrative  . None   Past Surgical History  Procedure Laterality Date  . Brain surgery      x3  . Appendectomy    . Joint replacement      thumbs and knees replacement  . Tubal ligation    . Back surgery      x2 back surgeries   Past Medical History  Diagnosis Date  . SOB (shortness of breath)   . CAD (coronary artery disease)     Prior LAD stenting with stent thrombosis occurring in a withdrawal phase of her Plavix undertaken because of trigeminal neuralgia and release surgery. It was treated medically.  . Anemia   . CHF (congestive heart failure)   . GERD (gastroesophageal reflux disease)   . Hyperlipidemia   . Osteoarthritis   . Ischemic cardiomyopathy   . Biventricular implantable cardiac defibrillator in situ     St. Jude implanted 2009  . Complication of anesthesia     massive heart attack  . Cancer     small bladder cancer removed no  treatment   BP 116/50  Pulse 94  Resp 14  Wt 142 lb 9.6 oz (64.683 kg)  BMI 26.08 kg/m2  SpO2 97%   Review of Systems  Neurological: Positive for weakness.  Psychiatric/Behavioral: Positive for dysphoric mood.  All other systems reviewed and  are negative.       Objective:   Physical Exam Constitutional: She is oriented to person, place, and time. She appears well-developed.  Very skinny  HENT:  Head: Normocephalic.  Neck: Neck supple.  Musculoskeletal: She exhibits tenderness.  Neurological: She is alert and oriented to person, place, and time. A cranial nerve deficit is present.  Skin: Skin is warm and dry.  Psychiatric: She has a normal mood and affect. Patient seems to be a little slow today.  Symmetric normal motor tone is noted throughout. Normal muscle bulk.Patient is very skinny. Muscle testing reveals 5/5 muscle strength of the upper extremity, and 5/5 of the lower extremity. Full range of motion in upper and lower extremities. ROM of spine is restricted. Fine motor  movements are normal in both hands.  Severe pain on gentle touch of right side of her face.  DTR in the upper and lower extremity are present and symmetric 2+. No clonus is noted.  Patient arises from chair without difficulty. Narrow based gait with normal arm swing bilateral .        Assessment & Plan:  1. Right-sided trigeminal neuralgia.  2. Postlaminectomy syndrome.  3. Osteoarthritis of the knees.  4. Cardiomyopathy.  5. Weight loss  PLAN:  1. Refilled percocet 10/325 for breakthrough pain. Will stay away from a long acting narcotic given the safey factors involved. Her pain is often episodic as well.  2 .Will try low dose trileptal to assista further with neuropathic pain, since she is taking this medication her pain is less severe.  3. I discussed the fact that she MUST BRING IN ALL MEDS AT [facility] OFFICE VISIT HERE TO INSURE HER SAFETY. Pill count was appropriate. 5. My pA will see her back in about a month.  6.To [facility] for brain stimulator, she is still thinking about this option, has not scheduled an appointment yet.. All questions were encouraged and answered.

## 2012-05-12 NOTE — Patient Instructions (Signed)
Try to stay as active as pain permits. 

## 2012-05-21 ENCOUNTER — Encounter: Payer: Self-pay | Admitting: Internal Medicine

## 2012-05-21 ENCOUNTER — Ambulatory Visit (INDEPENDENT_AMBULATORY_CARE_PROVIDER_SITE_OTHER): Payer: Medicare Other | Admitting: Internal Medicine

## 2012-05-21 VITALS — BP 102/68 | HR 83 | Ht 63.0 in | Wt 144.0 lb

## 2012-05-21 DIAGNOSIS — I5022 Chronic systolic (congestive) heart failure: Secondary | ICD-10-CM

## 2012-05-21 DIAGNOSIS — I2589 Other forms of chronic ischemic heart disease: Secondary | ICD-10-CM

## 2012-05-21 DIAGNOSIS — I2510 Atherosclerotic heart disease of native coronary artery without angina pectoris: Secondary | ICD-10-CM

## 2012-05-21 DIAGNOSIS — J449 Chronic obstructive pulmonary disease, unspecified: Secondary | ICD-10-CM | POA: Insufficient documentation

## 2012-05-21 DIAGNOSIS — Z95810 Presence of automatic (implantable) cardiac defibrillator: Secondary | ICD-10-CM

## 2012-05-21 DIAGNOSIS — G4710 Hypersomnia, unspecified: Secondary | ICD-10-CM | POA: Insufficient documentation

## 2012-05-21 DIAGNOSIS — I255 Ischemic cardiomyopathy: Secondary | ICD-10-CM

## 2012-05-21 LAB — ICD DEVICE OBSERVATION
AL AMPLITUDE: 2.2 mv
AL IMPEDENCE ICD: 300 Ohm
AL THRESHOLD: 0.75 V
BAMS-0003: 90 {beats}/min
BATTERY VOLTAGE: 2.5568 V
DEVICE MODEL ICD: 544456
HV IMPEDENCE: 35 Ohm
MODE SWITCH EPISODES: 4
PACEART VT: 0
RV LEAD THRESHOLD: 1 V
TOT-0006: 20090429000000
TOT-0009: 1
TOT-0010: 30
TZAT-0001FASTVT: 1
TZAT-0012FASTVT: 200 ms
TZAT-0013FASTVT: 2
TZAT-0019FASTVT: 7.5 V
TZAT-0020FASTVT: 1 ms
TZON-0004FASTVT: 12
TZON-0005FASTVT: 6
TZON-0010SLOWVT: 80 ms
TZST-0001FASTVT: 2
TZST-0001FASTVT: 4
TZST-0003FASTVT: 36 J
TZST-0003FASTVT: 36 J
VF: 2

## 2012-05-21 NOTE — Assessment & Plan Note (Signed)
She describes excessive daytime sleepiness.  I've encouraged her to consider sleep study. She is at this point disinclined to acquiesce  to my request

## 2012-05-21 NOTE — Assessment & Plan Note (Signed)
The patient's device was interrogated.  The information was reviewed. No changes were made in the programming.    

## 2012-05-21 NOTE — Assessment & Plan Note (Signed)
I have reviewed this with the patient and with her x-ray reports describe emphysema. I suggested that it would be helpful perhaps to see pulmonary

## 2012-05-21 NOTE — Patient Instructions (Signed)
Your physician has recommended that you have an AV optimization echo. During this procedure, an echocardiogram is performed to optimize the timing of your device using ultrasound and a device programmer. Changes will be made to the device settings to help the heart chambers pump more efficiently. This procedure takes approximately one hour.  Remote monitoring is used to monitor your Pacemaker of ICD from home. This monitoring reduces the number of office visits required to check your device to one time per year. It allows [place] to keep an eye on the functioning of your device to ensure it is working properly. You are scheduled for a device check from home on 08/16/12. You may send your transmission at any time that day. If you have a wireless device, the transmission will be sent automatically. After your physician reviews your transmission, you will receive a postcard with your next transmission date.   Your physician wants you to follow-up in: 1 year with Dr. Graciela Husbands. You will receive a reminder letter in the mail two months in advance. If you don't receive a letter, please call our office to schedule the follow-up appointment.

## 2012-05-21 NOTE — Assessment & Plan Note (Signed)
Patient is euvolemic. She is significant dyspnea on exertion.of her chest x-ray is over the last year or so described emphysema. This is likely contributing. I also wonder with her a short AV delays on her CRT device whether there isn't a cardiac component to his addressable. We'll undertake an AV optimization echo. I've encouraged her to consider pulmonary consultation as well as a sleep study

## 2012-05-21 NOTE — Progress Notes (Signed)
HPI: [name] is a 67 y.o. female Seen in followup for ICD implantation. She has a CRT St. Jude device in 2009. She had a nonspecific IVCD. She does not recall significant interval benefit. She has a history of ischemic heart disease with prior MI She denies chest pain. She does have dyspnea on exertion. She continues to smoke. She has intermittent left greater than right peripheral edema. She has significant daytime somnolence with a propensity to fall sleep   Current Outpatient Prescriptions  Medication Sig Dispense Refill  . carvedilol (COREG) 6.25 MG tablet Take 6.25 mg by mouth 2 (two) times daily with a meal. 1 tab twice a day      . cyclobenzaprine (FLEXERIL) 5 MG tablet Take 2 tablets by mouth 2 (two) times daily as needed.       . furosemide (LASIX) 80 MG tablet Take 80 mg by mouth 2 (two) times daily.       Marland Kitchen gabapentin (NEURONTIN) 800 MG tablet Take 800 mg by mouth 4 (four) times daily as needed. For pain      . hydrOXYzine (VISTARIL) 25 MG capsule Take 1 capsule (25 mg total) by mouth 3 (three) times daily as needed for itching.  90 capsule  1  . levothyroxine (SYNTHROID, LEVOTHROID) 50 MCG tablet Take 75 mcg by mouth daily. 1 tab daily      . NITROSTAT 0.4 MG SL tablet Place 0.4 mg under the tongue every 5 (five) minutes as needed. For chest pain      . oxyCODONE-acetaminophen (PERCOCET) 10-325 MG per tablet Take 1 tablet by mouth every 6 (six) hours as needed for pain.  90 tablet  0  . ramipril (ALTACE) 2.5 MG capsule Take 2.5 mg by mouth daily. daily      . simvastatin (ZOCOR) 10 MG tablet Take 10 mg by mouth daily.       Marland Kitchen spironolactone (ALDACTONE) 25 MG tablet Take 25 mg by mouth daily.        . clorazepate (TRANXENE) 3.75 MG tablet Take 1 tablet by mouth as needed.      . ezetimibe-simvastatin (VYTORIN) 10-10 MG per tablet Take 1 tablet by mouth at bedtime.      . methimazole (TAPAZOLE) 10 MG tablet       . OXcarbazepine (TRILEPTAL) 150 MG tablet Take 1 tablet (150  mg total) by mouth 2 (two) times daily.  90 tablet  3  . promethazine (PHENERGAN) 25 MG tablet Take 1 tablet (25 mg total) by mouth every 6 (six) hours as needed. for nasuea  90 tablet  1  . Sennosides-Docusate Sodium (SENNA PLUS PO) Take by mouth. Take 2-4 tablets bid as needed (max 8 per day)      . traMADol (ULTRAM) 50 MG tablet Take 1 tablet (50 mg total) by mouth every 6 (six) hours as needed for pain.  60 tablet  1  . venlafaxine (EFFEXOR) 75 MG tablet Take 75 mg by mouth 2 (two) times daily.       Marland Kitchen zolpidem (AMBIEN) 10 MG tablet Take 10 mg by mouth at bedtime as needed. For sleep       No current facility-administered medications for this visit.    No Known Allergies  Past Medical History  Diagnosis Date  . SOB (shortness of breath)   . CAD (coronary artery disease)     Prior LAD stenting with stent thrombosis occurring in a withdrawal phase of her Plavix undertaken because of trigeminal neuralgia and release  surgery. It was treated medically.  . Anemia   . CHF (congestive heart failure)   . GERD (gastroesophageal reflux disease)   . Hyperlipidemia   . Osteoarthritis   . Ischemic cardiomyopathy   . Biventricular implantable cardiac defibrillator in situ     St. Jude implanted 2009  . Complication of anesthesia     massive heart attack  . Cancer     small bladder cancer removed no  treatment    Past Surgical History  Procedure Laterality Date  . Brain surgery      x3  . Appendectomy    . Joint replacement      thumbs and knees replacement  . Tubal ligation    . Back surgery      x2 back surgeries    Family History  Problem Relation Age of Onset  . Heart disease Father     History   Social History  . Marital Status: Legally Separated    Spouse Name: N/A    Number of Children: N/A  . Years of Education: N/A   Occupational History  . Not on file.   Social History Main Topics  . Smoking status: Current Every Day Smoker -- 0.50 packs/day for 30 years     Types: Cigarettes    Last Attempt to Quit: 03/14/2011  . Smokeless tobacco: Never Used  . Alcohol Use: No  . Drug Use: No  . Sexually Active: No   Other Topics Concern  . Not on file   Social History Narrative  . No narrative on file    Fourteen point review of systems was negative except as noted in HPI and PMH   PHYSICAL EXAMINATION  Blood pressure 102/68, pulse 83, height 5\' 3"  (1.6 m), weight 144 lb (65.318 kg). Well developed A cachectic appearing female appearing  HENT normal Neck supple with JVP-flat Clear Regular rate and rhythm, no murmurs or gallops Abd-soft with active BS No Clubbing cyanosis edema Skin-warm and dry A & Oriented  Grossly normal sensory and motor function Smells cigarettes  .

## 2012-05-21 NOTE — Assessment & Plan Note (Signed)
.  coninue current meds

## 2012-05-24 HISTORY — PX: EYE SURGERY: SHX253

## 2012-06-09 ENCOUNTER — Encounter: Payer: Medicare Other | Admitting: Physical Medicine and Rehabilitation

## 2012-06-09 ENCOUNTER — Encounter: Payer: Self-pay | Admitting: Physical Medicine and Rehabilitation

## 2012-06-09 ENCOUNTER — Encounter
Payer: Medicare Other | Attending: Physical Medicine and Rehabilitation | Admitting: Physical Medicine and Rehabilitation

## 2012-06-09 VITALS — BP 109/59 | HR 70 | Resp 14 | Ht 63.0 in | Wt 144.6 lb

## 2012-06-09 DIAGNOSIS — R634 Abnormal weight loss: Secondary | ICD-10-CM | POA: Insufficient documentation

## 2012-06-09 DIAGNOSIS — I2589 Other forms of chronic ischemic heart disease: Secondary | ICD-10-CM | POA: Insufficient documentation

## 2012-06-09 DIAGNOSIS — I2510 Atherosclerotic heart disease of native coronary artery without angina pectoris: Secondary | ICD-10-CM | POA: Insufficient documentation

## 2012-06-09 DIAGNOSIS — I428 Other cardiomyopathies: Secondary | ICD-10-CM | POA: Insufficient documentation

## 2012-06-09 DIAGNOSIS — G500 Trigeminal neuralgia: Secondary | ICD-10-CM

## 2012-06-09 DIAGNOSIS — M961 Postlaminectomy syndrome, not elsewhere classified: Secondary | ICD-10-CM | POA: Insufficient documentation

## 2012-06-09 DIAGNOSIS — Z8551 Personal history of malignant neoplasm of bladder: Secondary | ICD-10-CM | POA: Insufficient documentation

## 2012-06-09 DIAGNOSIS — IMO0002 Reserved for concepts with insufficient information to code with codable children: Secondary | ICD-10-CM

## 2012-06-09 DIAGNOSIS — R209 Unspecified disturbances of skin sensation: Secondary | ICD-10-CM | POA: Insufficient documentation

## 2012-06-09 DIAGNOSIS — K219 Gastro-esophageal reflux disease without esophagitis: Secondary | ICD-10-CM | POA: Insufficient documentation

## 2012-06-09 DIAGNOSIS — I509 Heart failure, unspecified: Secondary | ICD-10-CM | POA: Insufficient documentation

## 2012-06-09 DIAGNOSIS — I252 Old myocardial infarction: Secondary | ICD-10-CM | POA: Insufficient documentation

## 2012-06-09 DIAGNOSIS — M1710 Unilateral primary osteoarthritis, unspecified knee: Secondary | ICD-10-CM | POA: Insufficient documentation

## 2012-06-09 DIAGNOSIS — M170 Bilateral primary osteoarthritis of knee: Secondary | ICD-10-CM

## 2012-06-09 DIAGNOSIS — R51 Headache: Secondary | ICD-10-CM | POA: Insufficient documentation

## 2012-06-09 DIAGNOSIS — Z95810 Presence of automatic (implantable) cardiac defibrillator: Secondary | ICD-10-CM | POA: Insufficient documentation

## 2012-06-09 MED ORDER — OXYCODONE-ACETAMINOPHEN 10-325 MG PO TABS: 1.0000 | ORAL_TABLET | Freq: Four times a day (QID) | ORAL | Status: DC | PRN

## 2012-06-09 NOTE — Patient Instructions (Signed)
Stay as active as tolerated. 

## 2012-06-09 NOTE — Progress Notes (Signed)
Subjective:    Patient ID: AMARRIA ANDREASEN, female    DOB: 1945-08-05, 67 y.o.   MRN: [id number]  HPI The patient is a 67 year old female , who presents with chronic right sided face pain . The patient complains about moderate to severe pain . Patient also complains about numbness in the same distribution . Covering face, applying heat, taking medications , changing positions alleviate the symptoms. Chewing, wind breeze, gentle touch aggrevates the symptoms. The patient grades her pain as a 2 /10.Patient has Hx of brain surgery, MI, CHF .She also complains about knee pain , bilateral. The problem has been stable, maybe a little worse with the colder weather.    Pain Inventory Average Pain 8 Pain Right Now 2 My pain is intermittent, sharp, burning, dull and stabbing  In the last 24 hours, has pain interfered with the following? General activity 8 Relation with others 8 Enjoyment of life 10 What TIME of day is your pain at its worst? daytime and evening Sleep (in general) Good  Pain is worse with: bending and some activites Pain improves with: medication Relief from Meds: 7  Mobility walk without assistance how many minutes can you walk? 10-15 ability to climb steps?  yes do you drive?  yes  Function retired I need assistance with the following:  household duties  Neuro/Psych weakness depression anxiety loss of taste or smell  Prior Studies Any changes since last visit?  no  Physicians involved in your care Any changes since last visit?  no   Family History  Problem Relation Age of Onset  . Heart disease Father    History   Social History  . Marital Status: Legally Separated    Spouse Name: N/A    Number of Children: N/A  . Years of Education: N/A   Social History Main Topics  . Smoking status: Current Every Day Smoker -- 0.50 packs/day for 30 years    Types: Cigarettes    Last Attempt to Quit: 03/14/2011  . Smokeless tobacco: Never Used  . Alcohol Use:  No  . Drug Use: No  . Sexually Active: No   Other Topics Concern  . None   Social History Narrative  . None   Past Surgical History  Procedure Laterality Date  . Brain surgery      x3  . Appendectomy    . Joint replacement      thumbs and knees replacement  . Tubal ligation    . Back surgery      x2 back surgeries  . Eye surgery Right 05/24/12   Past Medical History  Diagnosis Date  . SOB (shortness of breath)   . CAD (coronary artery disease)     Prior LAD stenting with stent thrombosis occurring in a withdrawal phase of her Plavix undertaken because of trigeminal neuralgia and release surgery. It was treated medically.  . Anemia   . CHF (congestive heart failure)   . GERD (gastroesophageal reflux disease)   . Hyperlipidemia   . Osteoarthritis   . Ischemic cardiomyopathy   . Biventricular implantable cardiac defibrillator in situ     St. Jude implanted 2009  . Complication of anesthesia     massive heart attack  . Cancer     small bladder cancer removed no  treatment  . Cataract    BP 109/59  Pulse 70  Resp 14  Ht 5\' 3"  (1.6 m)  Wt 144 lb 9.6 oz (65.59 kg)  BMI 25.62 kg/m2  SpO2 97%   Review of Systems  Constitutional: Positive for unexpected weight change.  Neurological: Positive for weakness.  Psychiatric/Behavioral: Positive for dysphoric mood. The patient is nervous/anxious.   All other systems reviewed and are negative.       Objective:   Physical Exam Constitutional: She is oriented to person, place, and time. She appears well-developed.  Very skinny  HENT:  Head: Normocephalic.  Neck: Neck supple.  Musculoskeletal: She exhibits tenderness.  Neurological: She is alert and oriented to person, place, and time. A cranial nerve deficit is present.  Skin: Skin is warm and dry.  Psychiatric: She has a normal mood and affect. Patient seems to be a little slow today.  Symmetric normal motor tone is noted throughout. Normal muscle bulk.Patient is  very skinny. Muscle testing reveals 5/5 muscle strength of the upper extremity, and 5/5 of the lower extremity. Full range of motion in upper and lower extremities. ROM of spine is restricted. Fine motor movements are normal in both hands.  Severe pain on gentle touch of right side of her face.  DTR in the upper and lower extremity are present and symmetric 2+. No clonus is noted.  Patient arises from chair without difficulty. Narrow based gait with normal arm swing bilateral .        Assessment & Plan:  1. Right-sided trigeminal neuralgia.  2. Postlaminectomy syndrome.  3. Osteoarthritis of the knees.  4. Cardiomyopathy.  5. Weight loss  PLAN:  1. Refilled percocet 10/325 for breakthrough pain. Will stay away from a long acting narcotic given the safey factors involved. Her pain is often episodic as well.  2 .Will try low dose trileptal to assista further with neuropathic pain, since she is taking this medication her pain is less severe.  3. I discussed the fact that she MUST BRING IN ALL MEDS AT [facility] OFFICE VISIT HERE TO INSURE HER SAFETY. Pill count was appropriate.  5. My pA will see her back in about a month.  6.To [facility] for brain stimulator, she is still thinking about this option, has not scheduled an appointment yet.. All questions were encouraged and answered. Follow up with Dr. Riley Kill in one month, she wants to ask him whether he could give her prescriptions for 2 month, so that she does not have to come every month. I told her that with percocet should come in every month.

## 2012-06-15 ENCOUNTER — Other Ambulatory Visit: Payer: Self-pay

## 2012-06-15 ENCOUNTER — Ambulatory Visit (INDEPENDENT_AMBULATORY_CARE_PROVIDER_SITE_OTHER): Payer: Medicare Other | Admitting: *Deleted

## 2012-06-15 ENCOUNTER — Encounter: Payer: Self-pay | Admitting: Internal Medicine

## 2012-06-15 ENCOUNTER — Ambulatory Visit (HOSPITAL_COMMUNITY): Payer: Medicare Other | Attending: Internal Medicine | Admitting: Radiology

## 2012-06-15 DIAGNOSIS — I2589 Other forms of chronic ischemic heart disease: Secondary | ICD-10-CM | POA: Insufficient documentation

## 2012-06-15 DIAGNOSIS — J4489 Other specified chronic obstructive pulmonary disease: Secondary | ICD-10-CM | POA: Insufficient documentation

## 2012-06-15 DIAGNOSIS — E785 Hyperlipidemia, unspecified: Secondary | ICD-10-CM | POA: Insufficient documentation

## 2012-06-15 DIAGNOSIS — I2510 Atherosclerotic heart disease of native coronary artery without angina pectoris: Secondary | ICD-10-CM | POA: Insufficient documentation

## 2012-06-15 DIAGNOSIS — F17200 Nicotine dependence, unspecified, uncomplicated: Secondary | ICD-10-CM | POA: Insufficient documentation

## 2012-06-15 DIAGNOSIS — I359 Nonrheumatic aortic valve disorder, unspecified: Secondary | ICD-10-CM | POA: Insufficient documentation

## 2012-06-15 DIAGNOSIS — Z95810 Presence of automatic (implantable) cardiac defibrillator: Secondary | ICD-10-CM

## 2012-06-15 DIAGNOSIS — I255 Ischemic cardiomyopathy: Secondary | ICD-10-CM

## 2012-06-15 DIAGNOSIS — I379 Nonrheumatic pulmonary valve disorder, unspecified: Secondary | ICD-10-CM | POA: Insufficient documentation

## 2012-06-15 DIAGNOSIS — I5022 Chronic systolic (congestive) heart failure: Secondary | ICD-10-CM | POA: Insufficient documentation

## 2012-06-15 DIAGNOSIS — J449 Chronic obstructive pulmonary disease, unspecified: Secondary | ICD-10-CM | POA: Insufficient documentation

## 2012-06-15 DIAGNOSIS — I079 Rheumatic tricuspid valve disease, unspecified: Secondary | ICD-10-CM | POA: Insufficient documentation

## 2012-06-15 LAB — ICD DEVICE OBSERVATION
AL THRESHOLD: 0.75 V
BAMS-0001: 150 {beats}/min
BAMS-0003: 90 {beats}/min
BATTERY VOLTAGE: 2.57 V
DEVICE MODEL ICD: 544456
HV IMPEDENCE: 37 Ohm
LV LEAD IMPEDENCE ICD: 430 Ohm
RV LEAD IMPEDENCE ICD: 360 Ohm
TZAT-0001FASTVT: 1
TZAT-0004FASTVT: 8
TZAT-0012FASTVT: 200 ms
TZAT-0013FASTVT: 2
TZAT-0019FASTVT: 7.5 V
TZON-0003SLOWVT: 400 ms
TZON-0004FASTVT: 12
TZON-0005SLOWVT: 6
TZST-0001FASTVT: 4
TZST-0003FASTVT: 36 J
TZST-0003FASTVT: 36 J
VENTRICULAR PACING ICD: 99 pct

## 2012-06-15 NOTE — Progress Notes (Signed)
Echocardiogram performed with AV Optimization.  

## 2012-06-15 NOTE — Progress Notes (Signed)
AV opt performed.

## 2012-07-06 ENCOUNTER — Encounter: Payer: Medicare Other | Attending: Physical Medicine & Rehabilitation | Admitting: Physical Medicine & Rehabilitation

## 2012-07-06 DIAGNOSIS — IMO0002 Reserved for concepts with insufficient information to code with codable children: Secondary | ICD-10-CM | POA: Insufficient documentation

## 2012-07-06 DIAGNOSIS — G500 Trigeminal neuralgia: Secondary | ICD-10-CM | POA: Insufficient documentation

## 2012-07-06 DIAGNOSIS — M1710 Unilateral primary osteoarthritis, unspecified knee: Secondary | ICD-10-CM | POA: Insufficient documentation

## 2012-07-07 ENCOUNTER — Encounter
Payer: Medicare Other | Attending: Physical Medicine and Rehabilitation | Admitting: Physical Medicine and Rehabilitation

## 2012-07-07 ENCOUNTER — Encounter: Payer: Self-pay | Admitting: Physical Medicine and Rehabilitation

## 2012-07-07 VITALS — BP 107/64 | HR 81 | Resp 14 | Ht 62.0 in | Wt 140.0 lb

## 2012-07-07 DIAGNOSIS — M170 Bilateral primary osteoarthritis of knee: Secondary | ICD-10-CM

## 2012-07-07 DIAGNOSIS — M1710 Unilateral primary osteoarthritis, unspecified knee: Secondary | ICD-10-CM | POA: Insufficient documentation

## 2012-07-07 DIAGNOSIS — R51 Headache: Secondary | ICD-10-CM | POA: Insufficient documentation

## 2012-07-07 DIAGNOSIS — R634 Abnormal weight loss: Secondary | ICD-10-CM | POA: Insufficient documentation

## 2012-07-07 DIAGNOSIS — M25569 Pain in unspecified knee: Secondary | ICD-10-CM | POA: Insufficient documentation

## 2012-07-07 DIAGNOSIS — G8929 Other chronic pain: Secondary | ICD-10-CM | POA: Insufficient documentation

## 2012-07-07 DIAGNOSIS — IMO0002 Reserved for concepts with insufficient information to code with codable children: Secondary | ICD-10-CM

## 2012-07-07 DIAGNOSIS — M961 Postlaminectomy syndrome, not elsewhere classified: Secondary | ICD-10-CM | POA: Insufficient documentation

## 2012-07-07 DIAGNOSIS — I509 Heart failure, unspecified: Secondary | ICD-10-CM | POA: Insufficient documentation

## 2012-07-07 DIAGNOSIS — I252 Old myocardial infarction: Secondary | ICD-10-CM | POA: Insufficient documentation

## 2012-07-07 DIAGNOSIS — I428 Other cardiomyopathies: Secondary | ICD-10-CM | POA: Insufficient documentation

## 2012-07-07 DIAGNOSIS — G500 Trigeminal neuralgia: Secondary | ICD-10-CM | POA: Insufficient documentation

## 2012-07-07 DIAGNOSIS — R209 Unspecified disturbances of skin sensation: Secondary | ICD-10-CM | POA: Insufficient documentation

## 2012-07-07 MED ORDER — OXYCODONE-ACETAMINOPHEN 10-325 MG PO TABS: 1.0000 | ORAL_TABLET | Freq: Four times a day (QID) | ORAL | Status: DC | PRN

## 2012-07-07 NOTE — Progress Notes (Signed)
Subjective:    Patient ID: [name], female    DOB: June 25, 1945, 67 y.o.   MRN: [id number]  HPI The patient is a 67 year old female , who presents with chronic right sided face pain . The patient complains about moderate to severe pain . Patient also complains about numbness in the same distribution . Covering face, applying heat, taking medications , changing positions alleviate the symptoms. Chewing, wind breeze, gentle touch aggrevates the symptoms. The patient grades her pain as a 5 /10.Patient has Hx of brain surgery, MI, CHF .She also complains about knee pain , bilateral. The problem has been stable .   Pain Inventory Average Pain 10 Pain Right Now 10 My pain is sharp, burning, dull and stabbing  In the last 24 hours, has pain interfered with the following? General activity 4 Relation with others 4 Enjoyment of life 5 What TIME of day is your pain at its worst? varies Sleep (in general) Good  Pain is worse with: bending and some activites Pain improves with: medication Relief from Meds: 6  Mobility walk without assistance how many minutes can you walk? varies ability to climb steps?  yes do you drive?  yes Do you have any goals in this area?  no  Function retired I need assistance with the following:  household duties Do you have any goals in this area?  no  Neuro/Psych depression loss of taste or smell  Prior Studies Any changes since last visit?  yes heart ultrasound  Physicians involved in your care Any changes since last visit?  no   Family History  Problem Relation Age of Onset  . Heart disease Father    History   Social History  . Marital Status: Legally Separated    Spouse Name: N/A    Number of Children: N/A  . Years of Education: N/A   Social History Main Topics  . Smoking status: Current Every Day Smoker -- 0.50 packs/day for 30 years    Types: Cigarettes    Last Attempt to Quit: 03/14/2011  . Smokeless tobacco: Never Used  .  Alcohol Use: No  . Drug Use: No  . Sexually Active: No   Other Topics Concern  . None   Social History Narrative  . None   Past Surgical History  Procedure Laterality Date  . Brain surgery      x3  . Appendectomy    . Joint replacement      thumbs and knees replacement  . Tubal ligation    . Back surgery      x2 back surgeries  . Eye surgery Right 05/24/12   Past Medical History  Diagnosis Date  . SOB (shortness of breath)   . CAD (coronary artery disease)     Prior LAD stenting with stent thrombosis occurring in a withdrawal phase of her Plavix undertaken because of trigeminal neuralgia and release surgery. It was treated medically.  . Anemia   . CHF (congestive heart failure)   . GERD (gastroesophageal reflux disease)   . Hyperlipidemia   . Osteoarthritis   . Ischemic cardiomyopathy   . Biventricular implantable cardiac defibrillator in situ     St. Jude implanted 2009  . Complication of anesthesia     massive heart attack  . Cancer     small bladder cancer removed no  treatment  . Cataract    BP 107/64  Pulse 81  Resp 14  Ht 5\' 2"  (1.575 m)  Wt 140 lb (  63.504 kg)  BMI 25.6 kg/m2  SpO2 96%     Review of Systems  Respiratory: Positive for shortness of breath.   Psychiatric/Behavioral: Positive for dysphoric mood.  All other systems reviewed and are negative.       Objective:   Physical Exam Constitutional: She is oriented to person, place, and time. She appears well-developed.  Very skinny  HENT:  Head: Normocephalic.  Neck: Neck supple.  Musculoskeletal: She exhibits tenderness.  Neurological: She is alert and oriented to person, place, and time. A cranial nerve deficit is present.  Skin: Skin is warm and dry.  Psychiatric: She has a normal mood and affect. Patient seems to be a little slow today.  Symmetric normal motor tone is noted throughout. Normal muscle bulk.Patient is very skinny. Muscle testing reveals 5/5 muscle strength of the upper  extremity, and 5/5 of the lower extremity. Full range of motion in upper and lower extremities. ROM of spine is restricted. Fine motor movements are normal in both hands.  Severe pain on gentle touch of right side of her face.  DTR in the upper and lower extremity are present and symmetric 2+. No clonus is noted.  Patient arises from chair without difficulty. Narrow based gait with normal arm swing bilateral .        Assessment & Plan:  1. Right-sided trigeminal neuralgia.  2. Postlaminectomy syndrome.  3. Osteoarthritis of the knees.  4. Cardiomyopathy.  5. Weight loss  PLAN:  1. Refilled percocet 10/325 for breakthrough pain. Will stay away from a long acting narcotic given the safey factors involved. Her pain is often episodic as well.  2 .Will try low dose trileptal to assista further with neuropathic pain, since she is taking this medication her pain is less severe.  3. I discussed the fact that she MUST BRING IN ALL MEDS AT [facility] OFFICE VISIT HERE TO INSURE HER SAFETY. Pill count was appropriate.  5. My pA will see her back in about a month.  6.To [facility] for brain stimulator, she is still thinking about this option, has not scheduled an appointment yet.. All questions were encouraged and answered.  Follow up with Dr. Riley Kill in one month, she wants to ask him whether he could give her prescriptions for 2 month, so that she does not have to come every month. I told her that with percocet should come in every month.

## 2012-07-07 NOTE — Patient Instructions (Signed)
Stay as active as tolerated. 

## 2012-08-05 ENCOUNTER — Telehealth: Payer: Self-pay

## 2012-08-05 DIAGNOSIS — M170 Bilateral primary osteoarthritis of knee: Secondary | ICD-10-CM

## 2012-08-05 DIAGNOSIS — G500 Trigeminal neuralgia: Secondary | ICD-10-CM

## 2012-08-05 NOTE — Telephone Encounter (Signed)
Patient says she cannot afford the $35 copay for her appointment but would like her medication filled.  Please advise.

## 2012-08-06 ENCOUNTER — Encounter: Payer: Medicare Other | Admitting: Physical Medicine & Rehabilitation

## 2012-08-06 MED ORDER — OXYCODONE-ACETAMINOPHEN 10-325 MG PO TABS: 1.0000 | ORAL_TABLET | Freq: Four times a day (QID) | ORAL | Status: DC | PRN

## 2012-08-06 NOTE — Telephone Encounter (Signed)
I WILL REFILL THIS ONE TIME ONLY GIVEN THE HX HERE---HER OTHER OPTION IS TO CHANGE TO HYDROCODONE, WHICH IS C3, IF SHE DESIRES TO "SPREAD OUT" HER VISITS

## 2012-08-06 NOTE — Telephone Encounter (Signed)
Patient aware script is ready.  She does not want to change to hydrocodone so she will keep further appointment.s

## 2012-08-16 ENCOUNTER — Ambulatory Visit (INDEPENDENT_AMBULATORY_CARE_PROVIDER_SITE_OTHER): Payer: Medicare Other | Admitting: *Deleted

## 2012-08-16 DIAGNOSIS — I2589 Other forms of chronic ischemic heart disease: Secondary | ICD-10-CM

## 2012-08-16 DIAGNOSIS — Z95810 Presence of automatic (implantable) cardiac defibrillator: Secondary | ICD-10-CM

## 2012-08-16 DIAGNOSIS — I255 Ischemic cardiomyopathy: Secondary | ICD-10-CM

## 2012-08-17 ENCOUNTER — Encounter: Payer: Self-pay | Admitting: *Deleted

## 2012-08-18 ENCOUNTER — Encounter: Payer: Self-pay | Admitting: Internal Medicine

## 2012-08-18 LAB — REMOTE ICD DEVICE
AL IMPEDENCE ICD: 300 Ohm
LV LEAD IMPEDENCE ICD: 440 Ohm
RV LEAD AMPLITUDE: 12 mv

## 2012-08-26 ENCOUNTER — Other Ambulatory Visit: Payer: Self-pay | Admitting: Physical Medicine & Rehabilitation

## 2012-08-27 ENCOUNTER — Telehealth: Payer: Self-pay | Admitting: *Deleted

## 2012-08-27 MED ORDER — TRAMADOL HCL 50 MG PO TABS: 50.0000 mg | ORAL_TABLET | Freq: Four times a day (QID) | ORAL | Status: DC | PRN

## 2012-08-27 NOTE — Telephone Encounter (Signed)
Do you want to refill [name]'s tramadol?

## 2012-08-27 NOTE — Telephone Encounter (Signed)
Having many dental issues and needs something to get her through the weekend until she can see Dr Riley Kill on Wedndesday. Tramadol ordered

## 2012-08-30 ENCOUNTER — Encounter: Payer: Self-pay | Admitting: *Deleted

## 2012-09-01 ENCOUNTER — Encounter: Payer: Medicare Other | Attending: Physical Medicine & Rehabilitation | Admitting: Physical Medicine & Rehabilitation

## 2012-09-01 ENCOUNTER — Encounter: Payer: Self-pay | Admitting: Physical Medicine & Rehabilitation

## 2012-09-01 VITALS — BP 113/65 | HR 84 | Resp 14 | Ht 62.0 in | Wt 140.0 lb

## 2012-09-01 DIAGNOSIS — M1710 Unilateral primary osteoarthritis, unspecified knee: Secondary | ICD-10-CM

## 2012-09-01 DIAGNOSIS — G500 Trigeminal neuralgia: Secondary | ICD-10-CM | POA: Insufficient documentation

## 2012-09-01 DIAGNOSIS — Z5181 Encounter for therapeutic drug level monitoring: Secondary | ICD-10-CM | POA: Insufficient documentation

## 2012-09-01 DIAGNOSIS — Z79899 Other long term (current) drug therapy: Secondary | ICD-10-CM

## 2012-09-01 DIAGNOSIS — M170 Bilateral primary osteoarthritis of knee: Secondary | ICD-10-CM

## 2012-09-01 DIAGNOSIS — IMO0002 Reserved for concepts with insufficient information to code with codable children: Secondary | ICD-10-CM | POA: Insufficient documentation

## 2012-09-01 MED ORDER — OXCARBAZEPINE 300 MG PO TABS: 300.0000 mg | ORAL_TABLET | Freq: Two times a day (BID) | ORAL | Status: DC

## 2012-09-01 MED ORDER — DICLOFENAC SODIUM 1 % TD GEL: 1.0000 "application " | Freq: Three times a day (TID) | TRANSDERMAL | Status: DC

## 2012-09-01 MED ORDER — OXYCODONE-ACETAMINOPHEN 10-325 MG PO TABS: 1.0000 | ORAL_TABLET | Freq: Four times a day (QID) | ORAL | Status: DC | PRN

## 2012-09-01 NOTE — Patient Instructions (Signed)
CALL ME WITH ANY PROBLEMS OR QUESTIONS (#[phone]).  HAVE A GOOD DAY  

## 2012-09-01 NOTE — Progress Notes (Signed)
Subjective:    Patient ID: [name], female    DOB: 02-06-1946, 67 y.o.   MRN: [id number]  HPI  [name] is back regarding her chronic pain. Not much appears to have changed. She continues to have difficulties with her facial pain, affecting her speech, tolerance of chewing, and general quality of life. She started the trileptal at last visit but did not continue it. We never got a chance to titrate it further. She doesn't recall any intolerance.  She is also complaining of "shin splints," knee pain and bilateral thumb pain  She ran out of her percocet and tramadol was called in.  She has a hard time taking care of her home and sometimes just getting around in her house.  Pain Inventory Average Pain 8 Pain Right Now 8 My pain is sharp, burning, dull and stabbing  In the last 24 hours, has pain interfered with the following? General activity 9 Relation with others 9 Enjoyment of life 9 What TIME of day is your pain at its worst? all Sleep (in general) Good  Pain is worse with: walking, bending and standing Pain improves with: medication Relief from Meds: 5  Mobility walk without assistance ability to climb steps?  yes do you drive?  yes  Function retired I need assistance with the following:  household duties  Neuro/Psych trouble walking depression anxiety suicidal thoughts no plan  Prior Studies Any changes since last visit?  no  Physicians involved in your care Any changes since last visit?  no   Family History  Problem Relation Age of Onset  . Heart disease Father    History   Social History  . Marital Status: Legally Separated    Spouse Name: N/A    Number of Children: N/A  . Years of Education: N/A   Social History Main Topics  . Smoking status: Current Every Day Smoker -- 0.50 packs/day for 30 years    Types: Cigarettes    Last Attempt to Quit: 03/14/2011  . Smokeless tobacco: Never Used  . Alcohol Use: No  . Drug Use: No  . Sexually  Active: No   Other Topics Concern  . None   Social History Narrative  . None   Past Surgical History  Procedure Laterality Date  . Brain surgery      x3  . Appendectomy    . Joint replacement      thumbs and knees replacement  . Tubal ligation    . Back surgery      x2 back surgeries  . Eye surgery Right 05/24/12   Past Medical History  Diagnosis Date  . SOB (shortness of breath)   . CAD (coronary artery disease)     Prior LAD stenting with stent thrombosis occurring in a withdrawal phase of her Plavix undertaken because of trigeminal neuralgia and release surgery. It was treated medically.  . Anemia   . CHF (congestive heart failure)   . GERD (gastroesophageal reflux disease)   . Hyperlipidemia   . Osteoarthritis   . Ischemic cardiomyopathy   . Biventricular implantable cardiac defibrillator in situ     St. Jude implanted 2009  . Complication of anesthesia     massive heart attack  . Cancer     small bladder cancer removed no  treatment  . Cataract    Resp 14  Ht 5\' 2"  (1.575 m)  Wt 140 lb (63.504 kg)  BMI 25.6 kg/m2  BP 113/65  P 84  O2sat 99%  Review of Systems  Musculoskeletal: Positive for gait problem.  Psychiatric/Behavioral: Positive for dysphoric mood. The patient is nervous/anxious.   All other systems reviewed and are negative.       Objective:   Physical Exam  Constitutional: She is oriented to person, place, and time. She appears well-developed and well-nourished.  More alert today.  HENT:  Head: Normocephalic and atraumatic.  Eyes: Conjunctivae and EOM are normal. Pupils are equal, round, and reactive to light.  Neck: Normal range of motion. Neck supple.  Cardiovascular: Normal rate and regular rhythm.  Pulmonary/Chest: Effort normal. No respiratory distress.  Abdominal: She exhibits no distension.  Neurological: Balance better. Continued facial hypersensitivity on the right. Strength near 5/5. Sensation grossly in tact in the limbs  except for below the knees where her fine touch is decreased. Musculoskeletal: Right foot notable for bruising over the proximal 4th toe. Hands and toes are generally tender to touch and show chronic arthritic signs/ sclerosis.  Psych: . Tearful at times, tangential and difficult to redrect at times.    Assessment & Plan:   ASSESSMENT:  1. Right-sided trigeminal neuralgia.  2. Postlaminectomy syndrome.  3. Osteoarthritis of the knees.  4. Cardiomyopathy.  5. Weight loss  6. Peripheral neuropathy   PLAN:  1. Will try percocet 10/325 for breakthrough pain. Will stay away from a long acting narcotic given the safey factors involved. .  2. Trileptal---increase to 300mg  BID. This hopefully will benefit her facial pain as well as her distal peripheral neuropathy. She may take this in addition to the gabapentin. 3. Rx for voltaren gel was written to help her hand and knee pain. Use TID 4. She may use the rest of her current tramadol but I won't refill as she has an rx for percocet now.  5. My PA will see her back in about a month. I can see her back in 4 months 6. She has stopped pursuing [facility] for brain stimulator as she is unsure that this will help her pain.  30 minutes of face to face patient care time were spent during this visit. All questions were encouraged and answered. Discussed the fact that she would be much safer and better off in a smaler living environment such as an apt or condo.

## 2012-09-11 ENCOUNTER — Encounter: Payer: Self-pay | Admitting: Internal Medicine

## 2012-09-29 ENCOUNTER — Encounter
Payer: Medicare Other | Attending: Physical Medicine and Rehabilitation | Admitting: Physical Medicine and Rehabilitation

## 2012-09-29 ENCOUNTER — Encounter: Payer: Self-pay | Admitting: Physical Medicine and Rehabilitation

## 2012-09-29 VITALS — BP 81/48 | HR 65 | Resp 14 | Ht 62.0 in | Wt 143.0 lb

## 2012-09-29 DIAGNOSIS — I509 Heart failure, unspecified: Secondary | ICD-10-CM | POA: Insufficient documentation

## 2012-09-29 DIAGNOSIS — M1710 Unilateral primary osteoarthritis, unspecified knee: Secondary | ICD-10-CM | POA: Insufficient documentation

## 2012-09-29 DIAGNOSIS — Z79899 Other long term (current) drug therapy: Secondary | ICD-10-CM

## 2012-09-29 DIAGNOSIS — G500 Trigeminal neuralgia: Secondary | ICD-10-CM | POA: Insufficient documentation

## 2012-09-29 DIAGNOSIS — M170 Bilateral primary osteoarthritis of knee: Secondary | ICD-10-CM

## 2012-09-29 DIAGNOSIS — R634 Abnormal weight loss: Secondary | ICD-10-CM | POA: Insufficient documentation

## 2012-09-29 DIAGNOSIS — IMO0002 Reserved for concepts with insufficient information to code with codable children: Secondary | ICD-10-CM

## 2012-09-29 DIAGNOSIS — I2510 Atherosclerotic heart disease of native coronary artery without angina pectoris: Secondary | ICD-10-CM | POA: Insufficient documentation

## 2012-09-29 DIAGNOSIS — M961 Postlaminectomy syndrome, not elsewhere classified: Secondary | ICD-10-CM | POA: Insufficient documentation

## 2012-09-29 DIAGNOSIS — Z5181 Encounter for therapeutic drug level monitoring: Secondary | ICD-10-CM

## 2012-09-29 DIAGNOSIS — I428 Other cardiomyopathies: Secondary | ICD-10-CM | POA: Insufficient documentation

## 2012-09-29 DIAGNOSIS — M25561 Pain in right knee: Secondary | ICD-10-CM

## 2012-09-29 DIAGNOSIS — M25569 Pain in unspecified knee: Secondary | ICD-10-CM

## 2012-09-29 DIAGNOSIS — I252 Old myocardial infarction: Secondary | ICD-10-CM | POA: Insufficient documentation

## 2012-09-29 MED ORDER — TRAMADOL HCL 50 MG PO TABS: 50.0000 mg | ORAL_TABLET | Freq: Four times a day (QID) | ORAL | Status: DC | PRN

## 2012-09-29 MED ORDER — OXYCODONE-ACETAMINOPHEN 10-325 MG PO TABS: 1.0000 | ORAL_TABLET | Freq: Four times a day (QID) | ORAL | Status: DC | PRN

## 2012-09-29 NOTE — Progress Notes (Signed)
Subjective:    Patient ID: [name], female    DOB: 1946-03-31, 67 y.o.   MRN: [id number]  HPI The patient is a 67 year old female , who presents with chronic right sided face pain . The patient complains about moderate to severe pain . Patient also complains about numbness in the same distribution . Covering face, applying heat, taking medications , changing positions alleviate the symptoms. Chewing, wind breeze, gentle touch aggrevates the symptoms. The patient grades her pain as a 5 /10.Patient has Hx of brain surgery, MI, CHF .She also complains about knee pain , bilateral, but the Voltaren gel prescribed at the last visit is giving her relief.. The problem has been stable .   Pain Inventory Average Pain 6 Pain Right Now 3 My pain is intermittent, sharp, burning, dull and stabbing  In the last 24 hours, has pain interfered with the following? General activity 4 Relation with others 6 Enjoyment of life 7 What TIME of day is your pain at its worst? varies Sleep (in general) Good  Pain is worse with: bending Pain improves with: medication Relief from Meds: 9  Mobility walk without assistance how many minutes can you walk? 15-30 ability to climb steps?  yes do you drive?  yes Do you have any goals in this area?  no  Function retired I need assistance with the following:  household duties Do you have any goals in this area?  no  Neuro/Psych weakness depression anxiety loss of taste or smell  Prior Studies Any changes since last visit?  no  Physicians involved in your care Any changes since last visit?  no   Family History  Problem Relation Age of Onset  . Heart disease Father    History   Social History  . Marital Status: Legally Separated    Spouse Name: N/A    Number of Children: N/A  . Years of Education: N/A   Social History Main Topics  . Smoking status: Current Every Day Smoker -- 0.50 packs/day for 30 years    Types: Cigarettes    Last  Attempt to Quit: 03/14/2011  . Smokeless tobacco: Never Used  . Alcohol Use: No  . Drug Use: No  . Sexually Active: No   Other Topics Concern  . None   Social History Narrative  . None   Past Surgical History  Procedure Laterality Date  . Brain surgery      x3  . Appendectomy    . Joint replacement      thumbs and knees replacement  . Tubal ligation    . Back surgery      x2 back surgeries  . Eye surgery Right 05/24/12   Past Medical History  Diagnosis Date  . SOB (shortness of breath)   . CAD (coronary artery disease)     Prior LAD stenting with stent thrombosis occurring in a withdrawal phase of her Plavix undertaken because of trigeminal neuralgia and release surgery. It was treated medically.  . Anemia   . CHF (congestive heart failure)   . GERD (gastroesophageal reflux disease)   . Hyperlipidemia   . Osteoarthritis   . Ischemic cardiomyopathy   . Biventricular implantable cardiac defibrillator in situ     St. Jude implanted 2009  . Complication of anesthesia     massive heart attack  . Cancer     small bladder cancer removed no  treatment  . Cataract    BP 81/48  Pulse 65  Resp  14  Ht 5\' 2"  (1.575 m)  Wt 143 lb (64.864 kg)  BMI 26.15 kg/m2  SpO2 95%     Review of Systems  Constitutional: Positive for appetite change.  Neurological: Positive for weakness.  Psychiatric/Behavioral: Positive for dysphoric mood. The patient is nervous/anxious.   All other systems reviewed and are negative.       Objective:   Physical Exam Constitutional: She is oriented to person, place, and time. She appears well-developed.  Very skinny  HENT:  Head: Normocephalic.  Neck: Neck supple.  Musculoskeletal: She exhibits tenderness.  Neurological: She is alert and oriented to person, place, and time. A cranial nerve deficit is present.  Skin: Skin is warm and dry.  Psychiatric: She has a normal mood and affect. Patient seems to be a little slow today.  Symmetric  normal motor tone is noted throughout. Normal muscle bulk.Patient is very skinny. Muscle testing reveals 5/5 muscle strength of the upper extremity, and 5/5 of the lower extremity. Full range of motion in upper and lower extremities. ROM of spine is restricted. Fine motor movements are normal in both hands.  Severe pain on gentle touch of right side of her face.  DTR in the upper and lower extremity are present and symmetric 2+. No clonus is noted.  Patient arises from chair without difficulty. Narrow based gait with normal arm swing bilateral .        Assessment & Plan:  1. Right-sided trigeminal neuralgia.  2. Postlaminectomy syndrome.  3. Osteoarthritis of the knees. Voltaren gel gives her some relief 4. Cardiomyopathy.  5. Weight loss  PLAN:  1. Refilled percocet 10/325 for breakthrough pain. Will stay away from a long acting narcotic given the safey factors involved. Her pain is often episodic as well.  2 .Will try low dose trileptal to assista further with neuropathic pain, since she is taking this medication her pain is less severe.  3. I discussed the fact that she MUST BRING IN ALL MEDS AT [facility] OFFICE VISIT HERE TO INSURE HER SAFETY. Pill count was appropriate.  5. My pA will see her back in about a month.  6.To [facility] for brain stimulator, she is still thinking about this option, has not scheduled an appointment yet.. All questions were encouraged and answered.  Follow up in 1 month

## 2012-09-29 NOTE — Patient Instructions (Signed)
Try to stay as active as tolerated 

## 2012-10-05 ENCOUNTER — Telehealth: Payer: Self-pay | Admitting: Internal Medicine

## 2012-10-05 NOTE — Telephone Encounter (Signed)
Pt Signed ROI, Echo Mailed to Pt Home Address 10/05/12/KM

## 2012-10-25 ENCOUNTER — Encounter: Payer: Self-pay | Admitting: Physical Medicine and Rehabilitation

## 2012-10-25 ENCOUNTER — Encounter
Payer: Medicare Other | Attending: Physical Medicine and Rehabilitation | Admitting: Physical Medicine and Rehabilitation

## 2012-10-25 VITALS — BP 92/58 | HR 82 | Resp 14 | Ht 62.0 in | Wt 137.0 lb

## 2012-10-25 DIAGNOSIS — Z5181 Encounter for therapeutic drug level monitoring: Secondary | ICD-10-CM

## 2012-10-25 DIAGNOSIS — I509 Heart failure, unspecified: Secondary | ICD-10-CM | POA: Insufficient documentation

## 2012-10-25 DIAGNOSIS — I428 Other cardiomyopathies: Secondary | ICD-10-CM | POA: Insufficient documentation

## 2012-10-25 DIAGNOSIS — I252 Old myocardial infarction: Secondary | ICD-10-CM | POA: Insufficient documentation

## 2012-10-25 DIAGNOSIS — I2510 Atherosclerotic heart disease of native coronary artery without angina pectoris: Secondary | ICD-10-CM | POA: Insufficient documentation

## 2012-10-25 DIAGNOSIS — G500 Trigeminal neuralgia: Secondary | ICD-10-CM

## 2012-10-25 DIAGNOSIS — Z79899 Other long term (current) drug therapy: Secondary | ICD-10-CM | POA: Insufficient documentation

## 2012-10-25 DIAGNOSIS — M961 Postlaminectomy syndrome, not elsewhere classified: Secondary | ICD-10-CM | POA: Insufficient documentation

## 2012-10-25 DIAGNOSIS — M170 Bilateral primary osteoarthritis of knee: Secondary | ICD-10-CM

## 2012-10-25 DIAGNOSIS — R634 Abnormal weight loss: Secondary | ICD-10-CM | POA: Insufficient documentation

## 2012-10-25 DIAGNOSIS — M1710 Unilateral primary osteoarthritis, unspecified knee: Secondary | ICD-10-CM | POA: Insufficient documentation

## 2012-10-25 DIAGNOSIS — IMO0002 Reserved for concepts with insufficient information to code with codable children: Secondary | ICD-10-CM

## 2012-10-25 MED ORDER — OXYCODONE-ACETAMINOPHEN 10-325 MG PO TABS: 1.0000 | ORAL_TABLET | Freq: Four times a day (QID) | ORAL | Status: DC | PRN

## 2012-10-25 NOTE — Patient Instructions (Signed)
Stay as active as tolerated. 

## 2012-10-25 NOTE — Progress Notes (Signed)
Subjective:    Patient ID: [name], female    DOB: February 03, 1946, 67 y.o.   MRN: [id number]  HPI The patient is a 67 year old female , who presents with chronic right sided face pain . The patient complains about moderate to severe pain . Patient also complains about numbness in the same distribution . Covering face, applying heat, taking medications , changing positions alleviate the symptoms. Chewing, wind breeze, gentle touch aggrevates the symptoms. The patient grades her pain as a 1 /10.Patient has Hx of brain surgery, MI, CHF .She also complains about knee pain , bilateral, but the Voltaren gel prescribed at the last visit is giving her relief. The problem has been stable .   Pain Inventory Average Pain 4 Pain Right Now 1 My pain is intermittent, sharp, burning, stabbing and aching  In the last 24 hours, has pain interfered with the following? General activity 2 Relation with others 2 Enjoyment of life 2 What TIME of day is your pain at its worst? anytime Sleep (in general) Good  Pain is worse with: walking, bending and some activites Pain improves with: rest and medication Relief from Meds: 8  Mobility walk without assistance how many minutes can you walk? 30-45 ability to climb steps?  yes do you drive?  yes Do you have any goals in this area?  no  Function retired I need assistance with the following:  household duties Do you have any goals in this area?  no  Neuro/Psych weakness depression loss of taste or smell  Prior Studies Any changes since last visit?  no  Physicians involved in your care Had teeth pulled for dentures   Family History  Problem Relation Age of Onset  . Heart disease Father    History   Social History  . Marital Status: Legally Separated    Spouse Name: N/A    Number of Children: N/A  . Years of Education: N/A   Social History Main Topics  . Smoking status: Current Every Day Smoker -- 0.50 packs/day for 30 years   Types: Cigarettes    Last Attempt to Quit: 03/14/2011  . Smokeless tobacco: Never Used  . Alcohol Use: No  . Drug Use: No  . Sexually Active: No   Other Topics Concern  . None   Social History Narrative  . None   Past Surgical History  Procedure Laterality Date  . Brain surgery      x3  . Appendectomy    . Joint replacement      thumbs and knees replacement  . Tubal ligation    . Back surgery      x2 back surgeries  . Eye surgery Right 05/24/12   Past Medical History  Diagnosis Date  . SOB (shortness of breath)   . CAD (coronary artery disease)     Prior LAD stenting with stent thrombosis occurring in a withdrawal phase of her Plavix undertaken because of trigeminal neuralgia and release surgery. It was treated medically.  . Anemia   . CHF (congestive heart failure)   . GERD (gastroesophageal reflux disease)   . Hyperlipidemia   . Osteoarthritis   . Ischemic cardiomyopathy   . Biventricular implantable cardiac defibrillator in situ     St. Jude implanted 2009  . Complication of anesthesia     massive heart attack  . Cancer     small bladder cancer removed no  treatment  . Cataract    BP 92/58  Pulse 82  Resp 14  Ht 5\' 2"  (1.575 m)  Wt 137 lb (62.143 kg)  BMI 25.05 kg/m2  SpO2 94%     Review of Systems  Neurological: Positive for weakness.  Psychiatric/Behavioral: Positive for dysphoric mood.  All other systems reviewed and are negative.       Objective:   Physical Exam Constitutional: She is oriented to person, place, and time. She appears well-developed.  Very skinny  HENT:  Head: Normocephalic.  Neck: Neck supple.  Musculoskeletal: She exhibits tenderness.  Neurological: She is alert and oriented to person, place, and time. A cranial nerve deficit is present.  Skin: Skin is warm and dry.  Psychiatric: She has a normal mood and affect. Patient seems to be a little slow today.  Symmetric normal motor tone is noted throughout. Normal muscle  bulk.Patient is very skinny. Muscle testing reveals 5/5 muscle strength of the upper extremity, and 5/5 of the lower extremity. Full range of motion in upper and lower extremities. ROM of spine is restricted. Fine motor movements are normal in both hands.  Severe pain on gentle touch of right side of her face.  DTR in the upper and lower extremity are present and symmetric 2+. No clonus is noted.  Patient arises from chair without difficulty. Narrow based gait with normal arm swing bilateral .        Assessment & Plan:  1. Right-sided trigeminal neuralgia.  2. Postlaminectomy syndrome.  3. Osteoarthritis of the knees. Voltaren gel gives her some relief  4. Cardiomyopathy.  5. Weight loss  PLAN:  1. Refilled percocet 10/325 for breakthrough pain. Will stay away from a long acting narcotic given the safey factors involved. Her pain is often episodic as well.  2 . Low dose trileptal to assist further with neuropathic pain, since she is taking this medication her pain is less severe.  3. She MUST BRING IN ALL MEDS AT [facility] OFFICE VISIT HERE TO INSURE HER SAFETY. Pill count was appropriate.  5.  PA will see her back in about a month.  6.To [facility] for brain stimulator, she is still thinking about this option, has not scheduled an appointment yet. All questions were encouraged and answered.  Follow up in 1 month

## 2012-10-26 ENCOUNTER — Other Ambulatory Visit: Payer: Medicare Other

## 2012-10-26 ENCOUNTER — Other Ambulatory Visit: Payer: Self-pay | Admitting: *Deleted

## 2012-10-26 ENCOUNTER — Other Ambulatory Visit (INDEPENDENT_AMBULATORY_CARE_PROVIDER_SITE_OTHER): Payer: Medicare Other

## 2012-10-26 DIAGNOSIS — E890 Postprocedural hypothyroidism: Secondary | ICD-10-CM

## 2012-10-27 LAB — T4, FREE: Free T4: 1.2 ng/dL (ref 0.60–1.60)

## 2012-10-28 ENCOUNTER — Ambulatory Visit: Payer: Medicare Other | Admitting: Endocrinology

## 2012-11-03 ENCOUNTER — Ambulatory Visit (INDEPENDENT_AMBULATORY_CARE_PROVIDER_SITE_OTHER): Payer: Medicare Other | Admitting: Endocrinology

## 2012-11-03 ENCOUNTER — Encounter: Payer: Self-pay | Admitting: Endocrinology

## 2012-11-03 VITALS — BP 118/62 | HR 64 | Temp 97.9°F | Resp 10 | Ht 63.0 in | Wt 139.5 lb

## 2012-11-03 DIAGNOSIS — E890 Postprocedural hypothyroidism: Secondary | ICD-10-CM

## 2012-11-03 MED ORDER — LEVOTHYROXINE SODIUM 125 MCG PO TABS: 125.0000 ug | ORAL_TABLET | Freq: Every day | ORAL | Status: DC

## 2012-11-03 NOTE — Progress Notes (Signed)
Patient ID: [name], female   DOB: 1945/08/11, 67 y.o.   MRN: [id number]  Reason for Appointment:  hypothyroidism, followup    History of Present Illness:   She became hypothyroid in 03/2012 following radioactive iodine treatment for hyperthyroidism in 11/13  She was initially started on small doses of levothyroxine at 75 mcg but with her TSH almost 50 in 3/14 the dose was increased to 137 mcg Difficulty in assessing her symptoms since she tends to have chronic nonspecific fatigue from her multiple medical problems She is quite compliant with taking her medication daily but takes it after breakfast  Her TSH was quite normal and follow up in 06/2012 0.73 and she is now here for further followup  Currently she still feels somewhat petite but did not complain of any unusual palpitations, shortness of breath, shakiness or heat intolerance Her weight has gone down about 4 pounds although she is still complaining about being overweight  No visits with results within 1 Week(s) from this visit. Latest known visit with results is:  Appointment on 10/26/2012  Component Date Value Range Status  . Free T4 10/26/2012 1.20  0.60 - 1.60 ng/dL Final  . TSH 40/98/1191 0.10* 0.35 - 5.50 uIU/mL Final      Medication List       This list is accurate as of: 11/03/12  4:59 PM.  Always use your most recent med list.               aspirin 325 MG EC tablet  Take 325 mg by mouth daily.     carvedilol 6.25 MG tablet  Commonly known as:  COREG  Take 6.25 mg by mouth 2 (two) times daily with a meal. 1 tab twice a day     clorazepate 3.75 MG tablet  Commonly known as:  TRANXENE  Take 1 tablet by mouth 2 (two) times daily as needed.     cyclobenzaprine 5 MG tablet  Commonly known as:  FLEXERIL  Take 5 mg by mouth 3 (three) times daily as needed.     diclofenac sodium 1 % Gel  Commonly known as:  VOLTAREN  Apply 1 application topically 3 (three) times daily. Apply to knees and hands      furosemide 80 MG tablet  Commonly known as:  LASIX  Take 80 mg by mouth. 2 tabs in am and one in pm     gabapentin 800 MG tablet  Commonly known as:  NEURONTIN  Take 800 mg by mouth 4 (four) times daily as needed. For pain     hydrOXYzine 25 MG capsule  Commonly known as:  VISTARIL  Take 1 capsule (25 mg total) by mouth 3 (three) times daily as needed for itching.     iron polysaccharides 150 MG capsule  Commonly known as:  NIFEREX  Take 150 mg by mouth 2 (two) times daily.     KLOR-CON M20 20 MEQ tablet  Generic drug:  potassium chloride SA  Take 1 tablet by mouth daily.     levothyroxine 137 MCG tablet  Commonly known as:  SYNTHROID, LEVOTHROID  Take 137 mcg by mouth daily.     NITROSTAT 0.4 MG SL tablet  Generic drug:  nitroGLYCERIN  Place 0.4 mg under the tongue every 5 (five) minutes as needed. For chest pain     ofloxacin 0.3 % ophthalmic solution  Commonly known as:  OCUFLOX  1 drop 4 (four) times daily. right     Oxcarbazepine 300 MG  tablet  Commonly known as:  TRILEPTAL  Take 1 tablet (300 mg total) by mouth 2 (two) times daily.     oxyCODONE-acetaminophen 10-325 MG per tablet  Commonly known as:  PERCOCET  Take 1 tablet by mouth every 6 (six) hours as needed for pain.     promethazine 25 MG tablet  Commonly known as:  PHENERGAN  Take 1 tablet (25 mg total) by mouth every 6 (six) hours as needed. for nasuea     ramipril 2.5 MG capsule  Commonly known as:  ALTACE  Take 2.5 mg by mouth daily. daily     simvastatin 10 MG tablet  Commonly known as:  ZOCOR  Take 10 mg by mouth daily.     spironolactone 25 MG tablet  Commonly known as:  ALDACTONE  Take 25 mg by mouth daily.     traMADol 50 MG tablet  Commonly known as:  ULTRAM  Take 1 tablet (50 mg total) by mouth every 6 (six) hours as needed.     venlafaxine 75 MG tablet  Commonly known as:  EFFEXOR  Take 75 mg by mouth 2 (two) times daily.     zolpidem 10 MG tablet  Commonly known as:  AMBIEN   Take 10 mg by mouth at bedtime as needed. For sleep            Past Medical History  Diagnosis Date  . SOB (shortness of breath)   . CAD (coronary artery disease)     Prior LAD stenting with stent thrombosis occurring in a withdrawal phase of her Plavix undertaken because of trigeminal neuralgia and release surgery. It was treated medically.  . Anemia   . CHF (congestive heart failure)   . GERD (gastroesophageal reflux disease)   . Hyperlipidemia   . Osteoarthritis   . Ischemic cardiomyopathy   . Biventricular implantable cardiac defibrillator in situ     St. Jude implanted 2009  . Complication of anesthesia     massive heart attack  . Cancer     small bladder cancer removed no  treatment  . Cataract     Past Surgical History  Procedure Laterality Date  . Brain surgery      x3  . Appendectomy    . Joint replacement      thumbs and knees replacement  . Tubal ligation    . Back surgery      x2 back surgeries  . Eye surgery Right 05/24/12    Family History  Problem Relation Age of Onset  . Heart disease Father     Social History:  reports that she has been smoking Cigarettes.  She has a 15 pack-year smoking history. She has never used smokeless tobacco. She reports that she does not drink alcohol or use illicit drugs.  Allergies: No Known Allergies  Review of Systems:  CARDIOLOGY: she has history of coronary artery disease with multiple events and interventions History of COPD She has had systolic congestive heart failure History of chronic GI bleed and anemia History of trigeminal neuralgia in the past   .      ENDOCRINOLOGY:  no history of Diabetes.             Examination:   BP 118/62  Pulse 64  Temp(Src) 97.9 F (36.6 C)  Resp 10  Ht 5\' 3"  (1.6 m)  Wt 139 lb 8 oz (63.277 kg)  BMI 24.72 kg/m2  SpO2 98%   General Appearance: pleasant, not anxious or hyperkinetic.Marland Kitchen  Eyes: No proptosis or abnormal appearance of the eyelids   Neck: The  thyroid is not palpable        Neurological: REFLEXES: at biceps are normal         TREMORS: no tremors are present..     Assessments   Post ablative hypothyroidism: She is requiring relatively larger doses of levothyroxine for replacement therapy Since her TSH is now slightly low at 0.1 we'll reduce her dose to 125 mcg Her weight is down slightly butbshe is subjectively not feeling any different  She will need followup in 2 months also    Pooja Camuso 11/03/2012, 4:59 PM

## 2012-11-03 NOTE — Patient Instructions (Signed)
Reduce dose to 125ug

## 2012-11-15 ENCOUNTER — Other Ambulatory Visit: Payer: Self-pay | Admitting: Physical Medicine and Rehabilitation

## 2012-11-22 ENCOUNTER — Encounter: Payer: Self-pay | Admitting: Internal Medicine

## 2012-11-22 ENCOUNTER — Ambulatory Visit (INDEPENDENT_AMBULATORY_CARE_PROVIDER_SITE_OTHER): Payer: Medicare Other | Admitting: *Deleted

## 2012-11-22 DIAGNOSIS — I255 Ischemic cardiomyopathy: Secondary | ICD-10-CM

## 2012-11-22 DIAGNOSIS — I2589 Other forms of chronic ischemic heart disease: Secondary | ICD-10-CM

## 2012-11-22 DIAGNOSIS — Z95810 Presence of automatic (implantable) cardiac defibrillator: Secondary | ICD-10-CM

## 2012-11-22 LAB — REMOTE ICD DEVICE
ATRIAL PACING ICD: 7.9 pct
DEVICE MODEL ICD: 544456
FVT: 1
HV IMPEDENCE: 38 Ohm
RV LEAD AMPLITUDE: 12 mv
RV LEAD IMPEDENCE ICD: 390 Ohm
TZAT-0018FASTVT: NEGATIVE
TZAT-0019FASTVT: 7.5 V
TZON-0003FASTVT: 350 ms
TZON-0004FASTVT: 12
TZON-0010FASTVT: 80 ms
TZST-0001FASTVT: 2
TZST-0001FASTVT: 4
TZST-0003FASTVT: 36 J
VENTRICULAR PACING ICD: 94 pct

## 2012-11-25 ENCOUNTER — Encounter: Payer: Medicare Other | Admitting: Physical Medicine and Rehabilitation

## 2012-11-26 ENCOUNTER — Encounter: Payer: Self-pay | Admitting: Physical Medicine and Rehabilitation

## 2012-11-26 ENCOUNTER — Encounter: Payer: Medicare Other | Attending: Physical Medicine & Rehabilitation | Admitting: Physical Medicine and Rehabilitation

## 2012-11-26 VITALS — BP 106/58 | HR 81 | Resp 14 | Ht 63.0 in | Wt 138.0 lb

## 2012-11-26 DIAGNOSIS — M170 Bilateral primary osteoarthritis of knee: Secondary | ICD-10-CM

## 2012-11-26 DIAGNOSIS — IMO0002 Reserved for concepts with insufficient information to code with codable children: Secondary | ICD-10-CM

## 2012-11-26 DIAGNOSIS — Z79899 Other long term (current) drug therapy: Secondary | ICD-10-CM

## 2012-11-26 DIAGNOSIS — M1710 Unilateral primary osteoarthritis, unspecified knee: Secondary | ICD-10-CM

## 2012-11-26 DIAGNOSIS — G500 Trigeminal neuralgia: Secondary | ICD-10-CM

## 2012-11-26 DIAGNOSIS — Z5181 Encounter for therapeutic drug level monitoring: Secondary | ICD-10-CM

## 2012-11-26 MED ORDER — OXYCODONE-ACETAMINOPHEN 10-325 MG PO TABS: 1.0000 | ORAL_TABLET | Freq: Four times a day (QID) | ORAL | Status: DC | PRN

## 2012-11-26 NOTE — Progress Notes (Signed)
Subjective:    Patient ID: [name], female    DOB: 05-29-1945, 67 y.o.   MRN: [id number]  HPI The patient is a 67 year old female , who presents with chronic right sided face pain . The patient complains about moderate to severe pain . Patient also complains about numbness in the same distribution . Covering face, applying heat, taking medications , changing positions alleviate the symptoms. Chewing, wind breeze, gentle touch aggrevates the symptoms. The patient grades her pain as a 1 /10.Patient has Hx of brain surgery, MI, CHF .She also complains about knee pain , bilateral, but the Voltaren gel prescribed at the last visit is giving her relief. The problem has been stable .   Pain Inventory Average Pain 5 Pain Right Now 7 My pain is intermittent, sharp, burning, dull, stabbing and aching  In the last 24 hours, has pain interfered with the following? General activity 7 Relation with others 8 Enjoyment of life 9 What TIME of day is your pain at its worst? varies Sleep (in general) Good  Pain is worse with: bending, standing and some activites Pain improves with: rest, heat/ice and medication Relief from Meds: 5  Mobility walk without assistance how many minutes can you walk? 20-30 ability to climb steps?  yes do you drive?  yes Do you have any goals in this area?  no  Function retired I need assistance with the following:  household duties and shopping Do you have any goals in this area?  no  Neuro/Psych weakness depression loss of taste or smell  Prior Studies Any changes since last visit?  no  Physicians involved in your care Any changes since last visit?  no   Family History  Problem Relation Age of Onset  . Heart disease Father    History   Social History  . Marital Status: Legally Separated    Spouse Name: N/A    Number of Children: N/A  . Years of Education: N/A   Social History Main Topics  . Smoking status: Current Every Day Smoker -- 0.50  packs/day for 30 years    Types: Cigarettes    Last Attempt to Quit: 03/14/2011  . Smokeless tobacco: Never Used  . Alcohol Use: No  . Drug Use: No  . Sexual Activity: No   Other Topics Concern  . None   Social History Narrative  . None   Past Surgical History  Procedure Laterality Date  . Brain surgery      x3  . Appendectomy    . Joint replacement      thumbs and knees replacement  . Tubal ligation    . Back surgery      x2 back surgeries  . Eye surgery Right 05/24/12   Past Medical History  Diagnosis Date  . SOB (shortness of breath)   . CAD (coronary artery disease)     Prior LAD stenting with stent thrombosis occurring in a withdrawal phase of her Plavix undertaken because of trigeminal neuralgia and release surgery. It was treated medically.  . Anemia   . CHF (congestive heart failure)   . GERD (gastroesophageal reflux disease)   . Hyperlipidemia   . Osteoarthritis   . Ischemic cardiomyopathy   . Biventricular implantable cardiac defibrillator in situ     St. Jude implanted 2009  . Complication of anesthesia     massive heart attack  . Cancer     small bladder cancer removed no  treatment  . Cataract  BP 106/58  Pulse 81  Resp 14  Ht 5\' 3"  (1.6 m)  Wt 138 lb (62.596 kg)  BMI 24.45 kg/m2  SpO2 95%     Review of Systems  HENT:       Loss of taste/smell  Neurological: Positive for weakness.  Psychiatric/Behavioral: Positive for dysphoric mood. The patient is nervous/anxious.   All other systems reviewed and are negative.       Objective:   Physical Exam Constitutional: She is oriented to person, place, and time. She appears well-developed.  Very skinny  HENT:  Head: Normocephalic.  Neck: Neck supple.  Musculoskeletal: She exhibits tenderness.  Neurological: She is alert and oriented to person, place, and time. A cranial nerve deficit is present.  Skin: Skin is warm and dry.  Psychiatric: She has a normal mood and affect. Patient seems to  be a little slow today.  Symmetric normal motor tone is noted throughout. Normal muscle bulk.Patient is very skinny. Muscle testing reveals 5/5 muscle strength of the upper extremity, and 5/5 of the lower extremity. Full range of motion in upper and lower extremities. ROM of spine is restricted. Fine motor movements are normal in both hands.  Severe pain on gentle touch of right side of her face.  DTR in the upper and lower extremity are present and symmetric 2+. No clonus is noted.  Patient arises from chair without difficulty. Narrow based gait with normal arm swing bilateral .        Assessment & Plan:  1. Right-sided trigeminal neuralgia.  2. Postlaminectomy syndrome.  3. Osteoarthritis of the knees. Voltaren gel gives her some relief  4. Cardiomyopathy.  5. Weight loss  PLAN:  1. Refilled percocet 10/325 for breakthrough pain. Will stay away from a long acting narcotic given the safey factors involved. Her pain is often episodic as well.  2 . Low dose trileptal to assist further with neuropathic pain, since she is taking this medication her pain is less severe.  3. She MUST BRING IN ALL MEDS AT [facility] OFFICE VISIT HERE TO INSURE HER SAFETY. Pill count was appropriate.  5. PA will see her back in about a month.  6.To [facility] for brain stimulator, she is still thinking about this option, has not scheduled an appointment yet. All questions were encouraged and answered.  Follow up in 1 month

## 2012-11-26 NOTE — Patient Instructions (Signed)
Stay as active as tolerated. 

## 2012-11-30 ENCOUNTER — Encounter: Payer: Self-pay | Admitting: *Deleted

## 2012-12-23 ENCOUNTER — Encounter: Payer: Medicare Other | Admitting: Physical Medicine and Rehabilitation

## 2012-12-27 ENCOUNTER — Encounter
Payer: Medicare Other | Attending: Physical Medicine and Rehabilitation | Admitting: Physical Medicine and Rehabilitation

## 2012-12-27 ENCOUNTER — Encounter: Payer: Self-pay | Admitting: Physical Medicine and Rehabilitation

## 2012-12-27 VITALS — BP 92/55 | HR 84 | Resp 14 | Ht 63.0 in | Wt 139.0 lb

## 2012-12-27 DIAGNOSIS — Z8551 Personal history of malignant neoplasm of bladder: Secondary | ICD-10-CM | POA: Insufficient documentation

## 2012-12-27 DIAGNOSIS — Z95810 Presence of automatic (implantable) cardiac defibrillator: Secondary | ICD-10-CM | POA: Insufficient documentation

## 2012-12-27 DIAGNOSIS — K219 Gastro-esophageal reflux disease without esophagitis: Secondary | ICD-10-CM | POA: Insufficient documentation

## 2012-12-27 DIAGNOSIS — Z79899 Other long term (current) drug therapy: Secondary | ICD-10-CM

## 2012-12-27 DIAGNOSIS — Z5181 Encounter for therapeutic drug level monitoring: Secondary | ICD-10-CM

## 2012-12-27 DIAGNOSIS — R634 Abnormal weight loss: Secondary | ICD-10-CM | POA: Insufficient documentation

## 2012-12-27 DIAGNOSIS — M170 Bilateral primary osteoarthritis of knee: Secondary | ICD-10-CM

## 2012-12-27 DIAGNOSIS — IMO0002 Reserved for concepts with insufficient information to code with codable children: Secondary | ICD-10-CM

## 2012-12-27 DIAGNOSIS — I2589 Other forms of chronic ischemic heart disease: Secondary | ICD-10-CM | POA: Insufficient documentation

## 2012-12-27 DIAGNOSIS — G500 Trigeminal neuralgia: Secondary | ICD-10-CM | POA: Insufficient documentation

## 2012-12-27 DIAGNOSIS — Z96698 Presence of other orthopedic joint implants: Secondary | ICD-10-CM | POA: Insufficient documentation

## 2012-12-27 DIAGNOSIS — M961 Postlaminectomy syndrome, not elsewhere classified: Secondary | ICD-10-CM | POA: Insufficient documentation

## 2012-12-27 DIAGNOSIS — F17200 Nicotine dependence, unspecified, uncomplicated: Secondary | ICD-10-CM | POA: Insufficient documentation

## 2012-12-27 DIAGNOSIS — I2510 Atherosclerotic heart disease of native coronary artery without angina pectoris: Secondary | ICD-10-CM | POA: Insufficient documentation

## 2012-12-27 DIAGNOSIS — M1710 Unilateral primary osteoarthritis, unspecified knee: Secondary | ICD-10-CM | POA: Insufficient documentation

## 2012-12-27 DIAGNOSIS — R51 Headache: Secondary | ICD-10-CM | POA: Insufficient documentation

## 2012-12-27 DIAGNOSIS — Z96659 Presence of unspecified artificial knee joint: Secondary | ICD-10-CM | POA: Insufficient documentation

## 2012-12-27 DIAGNOSIS — E785 Hyperlipidemia, unspecified: Secondary | ICD-10-CM | POA: Insufficient documentation

## 2012-12-27 DIAGNOSIS — R209 Unspecified disturbances of skin sensation: Secondary | ICD-10-CM | POA: Insufficient documentation

## 2012-12-27 MED ORDER — OXYCODONE-ACETAMINOPHEN 10-325 MG PO TABS: 1.0000 | ORAL_TABLET | Freq: Four times a day (QID) | ORAL | Status: DC | PRN

## 2012-12-27 MED ORDER — PROMETHAZINE HCL 25 MG PO TABS: 25.0000 mg | ORAL_TABLET | Freq: Four times a day (QID) | ORAL | Status: DC | PRN

## 2012-12-27 NOTE — Patient Instructions (Signed)
Stay as active as tolerated. 

## 2012-12-27 NOTE — Progress Notes (Signed)
Subjective:    Patient ID: [name], female    DOB: 12-24-1945, 67 y.o.   MRN: [id number]  HPI The patient is a 67 year old female , who presents with chronic right sided face pain . The patient complains about moderate to severe pain . Patient also complains about numbness in the same distribution . Covering face, applying heat, taking medications , changing positions alleviate the symptoms. Chewing, wind breeze, gentle touch aggrevates the symptoms. The patient grades her pain as a 1 /10.Patient has Hx of brain surgery, MI, CHF .She also complains about knee pain , bilateral, but the Voltaren gel prescribed at the last visit is giving her relief. The problem has been stable .   Pain Inventory Average Pain 3 Pain Right Now 1 My pain is intermittent, sharp, burning, dull, stabbing and aching  In the last 24 hours, has pain interfered with the following? General activity 2 Relation with others 2 Enjoyment of life 2 What TIME of day is your pain at its worst? varies Sleep (in general) Good  Pain is worse with: bending, standing and some activites Pain improves with: heat/ice and medication Relief from Meds: 8  Mobility walk without assistance how many minutes can you walk? 15-30 Do you have any goals in this area?  no  Function disabled: date disabled na I need assistance with the following:  household duties and shopping Do you have any goals in this area?  no  Neuro/Psych depression loss of taste or smell  Prior Studies Any changes since last visit?  no  Physicians involved in your care Any changes since last visit?  no   Family History  Problem Relation Age of Onset  . Heart disease Father    History   Social History  . Marital Status: Legally Separated    Spouse Name: N/A    Number of Children: N/A  . Years of Education: N/A   Social History Main Topics  . Smoking status: Current Every Day Smoker -- 0.50 packs/day for 30 years    Types: Cigarettes     Last Attempt to Quit: 03/14/2011  . Smokeless tobacco: Never Used  . Alcohol Use: No  . Drug Use: No  . Sexual Activity: No   Other Topics Concern  . None   Social History Narrative  . None   Past Surgical History  Procedure Laterality Date  . Brain surgery      x3  . Appendectomy    . Joint replacement      thumbs and knees replacement  . Tubal ligation    . Back surgery      x2 back surgeries  . Eye surgery Right 05/24/12   Past Medical History  Diagnosis Date  . SOB (shortness of breath)   . CAD (coronary artery disease)     Prior LAD stenting with stent thrombosis occurring in a withdrawal phase of her Plavix undertaken because of trigeminal neuralgia and release surgery. It was treated medically.  . Anemia   . CHF (congestive heart failure)   . GERD (gastroesophageal reflux disease)   . Hyperlipidemia   . Osteoarthritis   . Ischemic cardiomyopathy   . Biventricular implantable cardiac defibrillator in situ     St. Jude implanted 2009  . Complication of anesthesia     massive heart attack  . Cancer     small bladder cancer removed no  treatment  . Cataract    BP 92/55  Pulse 84  Resp 14  Ht 5\' 3"  (1.6 m)  Wt 139 lb (63.05 kg)  BMI 24.63 kg/m2  SpO2 94%    Review of Systems  HENT:       Loss of taste/ smell  Psychiatric/Behavioral: Positive for dysphoric mood.  All other systems reviewed and are negative.       Objective:   Physical Exam Constitutional: She is oriented to person, place, and time. She appears well-developed.  Very skinny  HENT:  Head: Normocephalic.  Neck: Neck supple.  Musculoskeletal: She exhibits tenderness.  Neurological: She is alert and oriented to person, place, and time. A cranial nerve deficit is present.  Skin: Skin is warm and dry.  Psychiatric: She has a normal mood and affect. Patient seems to be a little slow today.  Symmetric normal motor tone is noted throughout. Normal muscle bulk.Patient is very skinny.  Muscle testing reveals 5/5 muscle strength of the upper extremity, and 5/5 of the lower extremity. Full range of motion in upper and lower extremities. ROM of spine is restricted. Fine motor movements are normal in both hands.  Severe pain on gentle touch of right side of her face.  DTR in the upper and lower extremity are present and symmetric 2+. No clonus is noted.  Patient arises from chair without difficulty. Narrow based gait with normal arm swing bilateral .        Assessment & Plan:  1. Right-sided trigeminal neuralgia.  2. Postlaminectomy syndrome.  3. Osteoarthritis of the knees. Voltaren gel gives her some relief  4. Cardiomyopathy.  5. Weight loss  PLAN:  1. Refilled percocet 10/325 for breakthrough pain. Will stay away from a long acting narcotic given the safey factors involved. Her pain is often episodic as well.  2 . Low dose trileptal to assist further with neuropathic pain, since she is taking this medication her pain is less severe.  3. She MUST BRING IN ALL MEDS AT  [facility] OFFICE VISIT HERE TO INSURE HER SAFETY. Pill count was appropriate.  5. PA will see her back in about a month.  6.To [facility] for brain stimulator, she is still thinking about this option, has not scheduled an appointment yet. All questions were encouraged and answered.  Follow up in 1 month

## 2013-01-18 ENCOUNTER — Other Ambulatory Visit: Payer: Self-pay | Admitting: Physical Medicine & Rehabilitation

## 2013-01-19 ENCOUNTER — Other Ambulatory Visit: Payer: Self-pay | Admitting: Cardiology

## 2013-01-25 ENCOUNTER — Encounter: Payer: Self-pay | Admitting: Physical Medicine and Rehabilitation

## 2013-01-25 ENCOUNTER — Encounter
Payer: Medicare Other | Attending: Physical Medicine and Rehabilitation | Admitting: Physical Medicine and Rehabilitation

## 2013-01-25 VITALS — BP 99/66 | HR 80 | Resp 14 | Ht 63.0 in | Wt 145.0 lb

## 2013-01-25 DIAGNOSIS — Z5181 Encounter for therapeutic drug level monitoring: Secondary | ICD-10-CM

## 2013-01-25 DIAGNOSIS — I2510 Atherosclerotic heart disease of native coronary artery without angina pectoris: Secondary | ICD-10-CM | POA: Insufficient documentation

## 2013-01-25 DIAGNOSIS — M25569 Pain in unspecified knee: Secondary | ICD-10-CM | POA: Insufficient documentation

## 2013-01-25 DIAGNOSIS — E785 Hyperlipidemia, unspecified: Secondary | ICD-10-CM | POA: Insufficient documentation

## 2013-01-25 DIAGNOSIS — R634 Abnormal weight loss: Secondary | ICD-10-CM | POA: Insufficient documentation

## 2013-01-25 DIAGNOSIS — K219 Gastro-esophageal reflux disease without esophagitis: Secondary | ICD-10-CM | POA: Insufficient documentation

## 2013-01-25 DIAGNOSIS — Z79899 Other long term (current) drug therapy: Secondary | ICD-10-CM

## 2013-01-25 DIAGNOSIS — Z95810 Presence of automatic (implantable) cardiac defibrillator: Secondary | ICD-10-CM | POA: Insufficient documentation

## 2013-01-25 DIAGNOSIS — G500 Trigeminal neuralgia: Secondary | ICD-10-CM

## 2013-01-25 DIAGNOSIS — IMO0002 Reserved for concepts with insufficient information to code with codable children: Secondary | ICD-10-CM

## 2013-01-25 DIAGNOSIS — R209 Unspecified disturbances of skin sensation: Secondary | ICD-10-CM | POA: Insufficient documentation

## 2013-01-25 DIAGNOSIS — M961 Postlaminectomy syndrome, not elsewhere classified: Secondary | ICD-10-CM | POA: Insufficient documentation

## 2013-01-25 DIAGNOSIS — M170 Bilateral primary osteoarthritis of knee: Secondary | ICD-10-CM

## 2013-01-25 DIAGNOSIS — M1710 Unilateral primary osteoarthritis, unspecified knee: Secondary | ICD-10-CM | POA: Insufficient documentation

## 2013-01-25 DIAGNOSIS — I252 Old myocardial infarction: Secondary | ICD-10-CM | POA: Insufficient documentation

## 2013-01-25 DIAGNOSIS — I428 Other cardiomyopathies: Secondary | ICD-10-CM | POA: Insufficient documentation

## 2013-01-25 MED ORDER — OXYCODONE-ACETAMINOPHEN 10-325 MG PO TABS: 1.0000 | ORAL_TABLET | Freq: Four times a day (QID) | ORAL | Status: DC | PRN

## 2013-01-25 MED ORDER — PROMETHAZINE HCL 25 MG PO TABS: 25.0000 mg | ORAL_TABLET | Freq: Four times a day (QID) | ORAL | Status: DC | PRN

## 2013-01-25 NOTE — Progress Notes (Signed)
Subjective:    Patient ID: [name], female    DOB: 1946-02-29, 67 y.o.   MRN: [id number]  HPI The patient is a 67 year old female , who presents with chronic right sided face pain . The patient complains about moderate to severe pain . Patient also complains about numbness in the same distribution . Covering face, applying heat, taking medications , changing positions alleviate the symptoms. Chewing, wind breeze, gentle touch aggrevates the symptoms. The patient grades her pain as a 7 /10.Patient has Hx of brain surgery, MI, CHF .She also complains about knee pain , bilateral, but the Voltaren gel prescribed at the last visit is giving her relief. The problem has been stable .  Pain Inventory Average Pain 4 Pain Right Now 7 My pain is sharp, burning, dull, stabbing and aching  In the last 24 hours, has pain interfered with the following? General activity 3 Relation with others 3 Enjoyment of life 3 What TIME of day is your pain at its worst? anytime Sleep (in general) Good  Pain is worse with: bending, standing and some activites Pain improves with: rest and medication Relief from Meds: 8  Mobility walk without assistance how many minutes can you walk? 15-30 ability to climb steps?  yes do you drive?  yes Do you have any goals in this area?  no  Function retired I need assistance with the following:  household duties Do you have any goals in this area?  no  Neuro/Psych trouble walking depression  Prior Studies Any changes since last visit?  no  Physicians involved in your care Any changes since last visit?  no   Family History  Problem Relation Age of Onset  . Heart disease Father    History   Social History  . Marital Status: Legally Separated    Spouse Name: N/A    Number of Children: N/A  . Years of Education: N/A   Social History Main Topics  . Smoking status: Current Every Day Smoker -- 0.50 packs/day for 30 years    Types: Cigarettes    Last  Attempt to Quit: 03/14/2011  . Smokeless tobacco: Never Used  . Alcohol Use: No  . Drug Use: No  . Sexual Activity: No   Other Topics Concern  . None   Social History Narrative  . None   Past Surgical History  Procedure Laterality Date  . Brain surgery      x3  . Appendectomy    . Joint replacement      thumbs and knees replacement  . Tubal ligation    . Back surgery      x2 back surgeries  . Eye surgery Right 05/24/12   Past Medical History  Diagnosis Date  . SOB (shortness of breath)   . CAD (coronary artery disease)     Prior LAD stenting with stent thrombosis occurring in a withdrawal phase of her Plavix undertaken because of trigeminal neuralgia and release surgery. It was treated medically.  . Anemia   . CHF (congestive heart failure)   . GERD (gastroesophageal reflux disease)   . Hyperlipidemia   . Osteoarthritis   . Ischemic cardiomyopathy   . Biventricular implantable cardiac defibrillator in situ     St. Jude implanted 2009  . Complication of anesthesia     massive heart attack  . Cancer     small bladder cancer removed no  treatment  . Cataract    BP 99/66  Pulse 80  Resp  14  Ht 5\' 3"  (1.6 m)  Wt 145 lb (65.772 kg)  BMI 25.69 kg/m2  SpO2 94%     Review of Systems  Musculoskeletal: Positive for gait problem.  Neurological: Positive for dizziness and weakness.  Psychiatric/Behavioral: Positive for dysphoric mood.  All other systems reviewed and are negative.       Objective:   Physical Exam Constitutional: She is oriented to person, place, and time. She appears well-developed.  Very skinny  HENT:  Head: Normocephalic.  Neck: Neck supple.  Musculoskeletal: She exhibits tenderness.  Neurological: She is alert and oriented to person, place, and time. A cranial nerve deficit is present.  Skin: Skin is warm and dry.  Psychiatric: She has a normal mood and affect. Patient seems to be a little slow today.  Symmetric normal motor tone is noted  throughout. Normal muscle bulk.Patient is very skinny. Muscle testing reveals 5/5 muscle strength of the upper extremity, and 5/5 of the lower extremity. Full range of motion in upper and lower extremities. ROM of spine is restricted. Fine motor movements are normal in both hands.  Severe pain on gentle touch of right side of her face.  DTR in the upper and lower extremity are present and symmetric 2+. No clonus is noted.  Patient arises from chair without difficulty. Narrow based gait with normal arm swing bilateral .         Assessment & Plan:  1. Right-sided trigeminal neuralgia.  2. Postlaminectomy syndrome.  3. Osteoarthritis of the knees. Voltaren gel gives her some relief  4. Cardiomyopathy.  5. Weight loss  PLAN:  1. Refilled percocet 10/325 for breakthrough pain. Will stay away from a long acting narcotic given the safey factors involved. Her pain is often episodic as well.  2 . Low dose trileptal to assist further with neuropathic pain, since she is taking this medication her pain is less severe.  3. She MUST BRING IN ALL MEDS AT [facility] OFFICE VISIT HERE TO INSURE HER SAFETY. Pill count was appropriate.  5. PA will see her back in about a month.  6.To [facility] for brain stimulator, she is still thinking about this option, has not scheduled an appointment yet. All questions were encouraged and answered.  Follow up in 1 month

## 2013-01-25 NOTE — Patient Instructions (Signed)
Stay as active as tolerated. 

## 2013-01-26 ENCOUNTER — Other Ambulatory Visit: Payer: Medicare Other

## 2013-02-02 ENCOUNTER — Ambulatory Visit: Payer: Medicare Other | Admitting: Endocrinology

## 2013-02-08 ENCOUNTER — Other Ambulatory Visit (INDEPENDENT_AMBULATORY_CARE_PROVIDER_SITE_OTHER): Payer: Medicare Other

## 2013-02-08 DIAGNOSIS — E890 Postprocedural hypothyroidism: Secondary | ICD-10-CM

## 2013-02-10 ENCOUNTER — Ambulatory Visit: Payer: Medicare Other | Admitting: Endocrinology

## 2013-02-11 ENCOUNTER — Encounter: Payer: Self-pay | Admitting: Endocrinology

## 2013-02-11 ENCOUNTER — Ambulatory Visit (INDEPENDENT_AMBULATORY_CARE_PROVIDER_SITE_OTHER): Payer: Medicare Other | Admitting: Endocrinology

## 2013-02-11 VITALS — BP 124/82 | HR 85 | Temp 98.6°F | Resp 12 | Ht 63.0 in | Wt 146.2 lb

## 2013-02-11 DIAGNOSIS — E890 Postprocedural hypothyroidism: Secondary | ICD-10-CM

## 2013-02-11 MED ORDER — LEVOTHYROXINE SODIUM 125 MCG PO TABS: 125.0000 ug | ORAL_TABLET | Freq: Every day | ORAL | Status: DC

## 2013-02-11 NOTE — Progress Notes (Signed)
Patient ID: [name], female   DOB: October 25, 1945, 67 y.o.   MRN: [id number]  Reason for Appointment:  hypothyroidism, followup  History of Present Illness:   She became hypothyroid in 03/2012 following radioactive iodine treatment for hyperthyroidism in 11/13  She was initially started on small doses of levothyroxine at 75 mcg but with her TSH almost 50 in 3/14 the dose was increased to 137 mcg With this her TSH was normal in 1/14 Subsequently has required relatively lower dose and on her last visit because of TSH of 0.1 her dose was reduced to 125 mcg Difficulty in assessing her symptoms since she tends to have chronic nonspecific fatigue from her multiple medical problems She is quite compliant with taking her medication daily but takes it after breakfast  Currently she still feels "lazy" but has no unusual heat or cold intolerance, palpitations or significant weight change  Wt Readings from Last 3 Encounters:  02/11/13 146 lb 3.2 oz (66.316 kg)  01/25/13 145 lb (65.772 kg)  12/27/12 139 lb (63.05 kg)    Appointment on 02/08/2013  Component Date Value Range Status  . Free T4 02/08/2013 0.64  0.60 - 1.60 ng/dL Final  . TSH 46/96/2952 2.29  0.35 - 5.50 uIU/mL Final       Medication List       This list is accurate as of: 02/11/13 10:06 AM.  Always use your most recent med list.               aspirin 325 MG EC tablet  Take 325 mg by mouth daily.     carvedilol 6.25 MG tablet  Commonly known as:  COREG  Take 6.25 mg by mouth 2 (two) times daily with a meal. 1 tab twice a day     clorazepate 3.75 MG tablet  Commonly known as:  TRANXENE  Take 1 tablet by mouth 2 (two) times daily as needed.     diclofenac sodium 1 % Gel  Commonly known as:  VOLTAREN  Apply 1 application topically 3 (three) times daily. Apply to knees and hands     furosemide 80 MG tablet  Commonly known as:  LASIX  Take 80 mg by mouth. 2 tabs in am and one in pm     gabapentin 800 MG tablet   Commonly known as:  NEURONTIN  Take 800 mg by mouth 4 (four) times daily as needed. For pain     iron polysaccharides 150 MG capsule  Commonly known as:  NIFEREX  Take 150 mg by mouth 2 (two) times daily.     levothyroxine 125 MCG tablet  Commonly known as:  SYNTHROID, LEVOTHROID  Take 1 tablet (125 mcg total) by mouth daily.     NITROSTAT 0.4 MG SL tablet  Generic drug:  nitroGLYCERIN  Place 0.4 mg under the tongue every 5 (five) minutes as needed. For chest pain     Oxcarbazepine 300 MG tablet  Commonly known as:  TRILEPTAL  TAKE 1 TABLET (300 MG TOTAL) BY MOUTH 2 (TWO) TIMES DAILY.     oxyCODONE-acetaminophen 10-325 MG per tablet  Commonly known as:  PERCOCET  Take 1 tablet by mouth every 6 (six) hours as needed for pain.     potassium chloride SA 20 MEQ tablet  Commonly known as:  KLOR-CON M20  Take 1 tablet (20 mEq total) by mouth 2 (two) times daily.     promethazine 25 MG tablet  Commonly known as:  PHENERGAN  Take 1 tablet (  25 mg total) by mouth every 6 (six) hours as needed. for nasuea     ramipril 2.5 MG capsule  Commonly known as:  ALTACE  Take 2.5 mg by mouth daily. daily     simvastatin 10 MG tablet  Commonly known as:  ZOCOR  Take 10 mg by mouth daily.     spironolactone 25 MG tablet  Commonly known as:  ALDACTONE  Take 25 mg by mouth daily.     traMADol 50 MG tablet  Commonly known as:  ULTRAM  TAKE 1 TABLET (50 MG TOTAL) BY MOUTH EVERY 6 (SIX) HOURS AS NEEDED.     venlafaxine 75 MG tablet  Commonly known as:  EFFEXOR  Take 75 mg by mouth 2 (two) times daily.     zolpidem 10 MG tablet  Commonly known as:  AMBIEN  Take 10 mg by mouth at bedtime as needed. For sleep            Past Medical History  Diagnosis Date  . SOB (shortness of breath)   . CAD (coronary artery disease)     Prior LAD stenting with stent thrombosis occurring in a withdrawal phase of her Plavix undertaken because of trigeminal neuralgia and release surgery. It was  treated medically.  . Anemia   . CHF (congestive heart failure)   . GERD (gastroesophageal reflux disease)   . Hyperlipidemia   . Osteoarthritis   . Ischemic cardiomyopathy   . Biventricular implantable cardiac defibrillator in situ     St. Jude implanted 2009  . Complication of anesthesia     massive heart attack  . Cancer     small bladder cancer removed no  treatment  . Cataract     Past Surgical History  Procedure Laterality Date  . Brain surgery      x3  . Appendectomy    . Joint replacement      thumbs and knees replacement  . Tubal ligation    . Back surgery      x2 back surgeries  . Eye surgery Right 05/24/12    Family History  Problem Relation Age of Onset  . Heart disease Father     Social History:  reports that she has been smoking Cigarettes.  She has a 15 pack-year smoking history. She has never used smokeless tobacco. She reports that she does not drink alcohol or use illicit drugs.  Allergies: No Known Allergies  Review of Systems:  CARDIOLOGY: she has history of coronary artery disease with multiple events and interventions History of COPD She has had systolic congestive heart failure History of chronic GI bleed and anemia History of trigeminal neuralgia in the past   .      As no history of Diabetes.             Examination:   BP 124/82  Pulse 85  Temp(Src) 98.6 F (37 C)  Resp 12  Ht 5\' 3"  (1.6 m)  Wt 146 lb 3.2 oz (66.316 kg)  BMI 25.90 kg/m2  SpO2 96%   General Appearance: pleasant, not anxious, slightly pale        Eyes:  external appearance normal  Neck: The thyroid is not palpable        Neurological: REFLEXES: at biceps are normal             Assessments   Post ablative hypothyroidism: She is requiring relatively large doses of levothyroxine for replacement therapy for her weight However her TSH is now  excellent at 2.3 with her current regimen of 125 mcg Discussed that her fatigue and other symptoms are likely to be related  to her hypothyroidism Reminded her to take the medication consistently in the morning before breakfast  She will need followup in 6 months   [name] 02/11/2013, 10:06 AM

## 2013-02-23 ENCOUNTER — Encounter
Payer: Medicare Other | Attending: Physical Medicine and Rehabilitation | Admitting: Physical Medicine and Rehabilitation

## 2013-02-23 ENCOUNTER — Encounter: Payer: Self-pay | Admitting: Physical Medicine and Rehabilitation

## 2013-02-23 VITALS — BP 103/58 | HR 73 | Resp 14 | Ht 63.0 in | Wt 140.0 lb

## 2013-02-23 DIAGNOSIS — M961 Postlaminectomy syndrome, not elsewhere classified: Secondary | ICD-10-CM

## 2013-02-23 DIAGNOSIS — M170 Bilateral primary osteoarthritis of knee: Secondary | ICD-10-CM

## 2013-02-23 DIAGNOSIS — M1710 Unilateral primary osteoarthritis, unspecified knee: Secondary | ICD-10-CM | POA: Insufficient documentation

## 2013-02-23 DIAGNOSIS — IMO0002 Reserved for concepts with insufficient information to code with codable children: Secondary | ICD-10-CM

## 2013-02-23 DIAGNOSIS — I2589 Other forms of chronic ischemic heart disease: Secondary | ICD-10-CM | POA: Insufficient documentation

## 2013-02-23 DIAGNOSIS — Z79899 Other long term (current) drug therapy: Secondary | ICD-10-CM

## 2013-02-23 DIAGNOSIS — Z5181 Encounter for therapeutic drug level monitoring: Secondary | ICD-10-CM

## 2013-02-23 DIAGNOSIS — G500 Trigeminal neuralgia: Secondary | ICD-10-CM | POA: Insufficient documentation

## 2013-02-23 MED ORDER — OXYCODONE-ACETAMINOPHEN 10-325 MG PO TABS: 1.0000 | ORAL_TABLET | Freq: Four times a day (QID) | ORAL | Status: DC | PRN

## 2013-02-23 NOTE — Progress Notes (Signed)
Subjective:    Patient ID: [name], female    DOB: Aug 29, 1945, 67 y.o.   MRN: [id number]  HPI The patient is a 67 year old female , who presents with chronic right sided face pain . The patient complains about moderate to severe pain . Patient also complains about numbness in the same distribution . Covering face, applying heat, taking medications , changing positions alleviate the symptoms. Chewing, wind breeze, gentle touch aggrevates the symptoms. The patient grades her pain as a 2 /10.Patient has Hx of brain surgery, MI, CHF .She also complains about knee pain , bilateral, but the Voltaren gel prescribed at the last visit is giving her relief. The problem has been stable .Although she reports that she fell 4 weeks ago, where she hurt her right eye, her vision was impaired , she is following up with an eye doctor, and her vision has improved some  Pain Inventory Average Pain 2 Pain Right Now 2 My pain is sharp, burning, dull, stabbing and aching  In the last 24 hours, has pain interfered with the following? General activity 2 Relation with others 2 Enjoyment of life 2 What TIME of day is your pain at its worst? varies Sleep (in general) Good  Pain is worse with: bending and standing Pain improves with: rest and medication Relief from Meds: 8  Mobility walk without assistance how many minutes can you walk? 30 ability to climb steps?  yes do you drive?  yes transfers alone Do you have any goals in this area?  no  Function retired I need assistance with the following:  household duties Do you have any goals in this area?  no  Neuro/Psych weakness dizziness depression loss of taste or smell  Prior Studies Any changes since last visit?  yes  Physicians involved in your care Any changes since last visit?  no   Family History  Problem Relation Age of Onset  . Heart disease Father    History   Social History  . Marital Status: Legally Separated    Spouse  Name: N/A    Number of Children: N/A  . Years of Education: N/A   Social History Main Topics  . Smoking status: Current Every Day Smoker -- 0.50 packs/day for 30 years    Types: Cigarettes    Last Attempt to Quit: 03/14/2011  . Smokeless tobacco: Never Used  . Alcohol Use: No  . Drug Use: No  . Sexual Activity: No   Other Topics Concern  . None   Social History Narrative  . None   Past Surgical History  Procedure Laterality Date  . Brain surgery      x3  . Appendectomy    . Joint replacement      thumbs and knees replacement  . Tubal ligation    . Back surgery      x2 back surgeries  . Eye surgery Right 05/24/12   Past Medical History  Diagnosis Date  . SOB (shortness of breath)   . CAD (coronary artery disease)     Prior LAD stenting with stent thrombosis occurring in a withdrawal phase of her Plavix undertaken because of trigeminal neuralgia and release surgery. It was treated medically.  . Anemia   . CHF (congestive heart failure)   . GERD (gastroesophageal reflux disease)   . Hyperlipidemia   . Osteoarthritis   . Ischemic cardiomyopathy   . Biventricular implantable cardiac defibrillator in situ     St. Jude implanted 2009  .  Complication of anesthesia     massive heart attack  . Cancer     small bladder cancer removed no  treatment  . Cataract    BP 103/58  Pulse 73  Resp 14  Ht 5\' 3"  (1.6 m)  Wt 140 lb (63.504 kg)  BMI 24.81 kg/m2  SpO2 98%     Review of Systems  HENT:       Loss of taste or smell  Neurological: Positive for dizziness and weakness.  All other systems reviewed and are negative.       Objective:   Physical Exam Constitutional: She is oriented to person, place, and time. She appears well-developed.  Very skinny  HENT:  Head: Normocephalic.  Neck: Neck supple.  Musculoskeletal: She exhibits tenderness.  Neurological: She is alert and oriented to person, place, and time. A cranial nerve deficit is present.  Skin: Skin is  warm and dry.  Psychiatric: She has a normal mood and affect. Patient seems to be a little slow today.  Symmetric normal motor tone is noted throughout. Normal muscle bulk.Patient is very skinny. Muscle testing reveals 5/5 muscle strength of the upper extremity, and 5/5 of the lower extremity. Full range of motion in upper and lower extremities. ROM of spine is restricted. Fine motor movements are normal in both hands.  Severe pain on gentle touch of right side of her face.  DTR in the upper and lower extremity are present and symmetric 2+. No clonus is noted.  Patient arises from chair without difficulty. Narrow based gait with normal arm swing bilateral .        Assessment & Plan:  1. Right-sided trigeminal neuralgia.  2. Postlaminectomy syndrome.  3. Osteoarthritis of the knees. Voltaren gel gives her some relief  4. Cardiomyopathy.  5. Weight loss  PLAN:  1. Refilled percocet 10/325 for breakthrough pain. Will stay away from a long acting narcotic given the safey factors involved. Her pain is often episodic as well.  2 . Low dose trileptal to assist further with neuropathic pain, since she is taking this medication her pain is less severe.  3. She MUST BRING IN ALL MEDS AT [facility] OFFICE VISIT HERE TO INSURE HER SAFETY. Pill count was appropriate.  5. PA will see her back in about a month.  6.To [facility] for brain stimulator, she is still thinking about this option, has not scheduled an appointment yet. All questions were encouraged and answered.  7. She fell 4 weeks ago, where she hurt her right eye, she has followed up with the ophthamologist, and will follow up with him again next week. Follow up in 1 month

## 2013-02-23 NOTE — Patient Instructions (Signed)
Stay as active as tolerated. 

## 2013-02-28 ENCOUNTER — Encounter (INDEPENDENT_AMBULATORY_CARE_PROVIDER_SITE_OTHER): Payer: Medicare Other | Admitting: *Deleted

## 2013-02-28 DIAGNOSIS — I509 Heart failure, unspecified: Secondary | ICD-10-CM

## 2013-02-28 DIAGNOSIS — I255 Ischemic cardiomyopathy: Secondary | ICD-10-CM

## 2013-02-28 DIAGNOSIS — I5022 Chronic systolic (congestive) heart failure: Secondary | ICD-10-CM

## 2013-02-28 LAB — MDC_IDC_ENUM_SESS_TYPE_REMOTE
Battery Remaining Longevity: 1 mo
Battery Voltage: 2.48 V
Brady Statistic AS VP Percent: 83 %
Brady Statistic RA Percent Paced: 11 %
Date Time Interrogation Session: 20141201070707
HighPow Impedance: 40 Ohm
Lead Channel Impedance Value: 310 Ohm
Lead Channel Pacing Threshold Amplitude: 0.75 V
Lead Channel Pacing Threshold Pulse Width: 0.5 ms
Lead Channel Sensing Intrinsic Amplitude: 2.5 mV
Lead Channel Setting Pacing Amplitude: 2 V
Lead Channel Setting Pacing Amplitude: 2 V
Lead Channel Setting Pacing Amplitude: 2.5 V
Lead Channel Setting Pacing Pulse Width: 0.5 ms
Lead Channel Setting Pacing Pulse Width: 0.5 ms
Lead Channel Setting Sensing Sensitivity: 0.3 mV
Zone Setting Detection Interval: 300 ms
Zone Setting Detection Interval: 400 ms

## 2013-03-04 ENCOUNTER — Ambulatory Visit (INDEPENDENT_AMBULATORY_CARE_PROVIDER_SITE_OTHER): Payer: Medicare Other | Admitting: Cardiology

## 2013-03-04 ENCOUNTER — Encounter (INDEPENDENT_AMBULATORY_CARE_PROVIDER_SITE_OTHER): Payer: Self-pay

## 2013-03-04 ENCOUNTER — Encounter: Payer: Self-pay | Admitting: Cardiology

## 2013-03-04 VITALS — BP 104/67 | HR 65 | Ht 63.0 in | Wt 145.8 lb

## 2013-03-04 DIAGNOSIS — I2589 Other forms of chronic ischemic heart disease: Secondary | ICD-10-CM

## 2013-03-04 DIAGNOSIS — I255 Ischemic cardiomyopathy: Secondary | ICD-10-CM

## 2013-03-04 DIAGNOSIS — I5022 Chronic systolic (congestive) heart failure: Secondary | ICD-10-CM

## 2013-03-04 DIAGNOSIS — I10 Essential (primary) hypertension: Secondary | ICD-10-CM

## 2013-03-04 DIAGNOSIS — I2510 Atherosclerotic heart disease of native coronary artery without angina pectoris: Secondary | ICD-10-CM

## 2013-03-04 DIAGNOSIS — Z95810 Presence of automatic (implantable) cardiac defibrillator: Secondary | ICD-10-CM

## 2013-03-04 NOTE — Patient Instructions (Addendum)
Your physician wants you to follow-up in: 6 months with Dr. Skains You will receive a reminder letter in the mail two months in advance. If you don't receive a letter, please call our office to schedule the follow-up appointment.  Your physician recommends that you continue on your current medications as directed. Please refer to the Current Medication list given to you today.  

## 2013-03-04 NOTE — Progress Notes (Signed)
[address] [place], [place]  [address] Phone: [phone] Fax:  [phone]  Date:  03/04/2013   ID:  [name], DOB 07-Feb-1946, MRN [id number]  PCP:  Johny Blamer, MD   History of Present Illness: [name] is a 67 y.o. female with ischemic cardiomyopathy ejection fraction in the 15-20% range, Ischemic cardiomyopathy status post Bi-V pacemaker, coronary artery disease with bare-metal stent implantation for in-stent restenosis here for followup. Dr. Graciela Husbands who provided her with AV optimization of her device. She was told that her device at baseline was fairly synchronized.  She is battled with long-standing depression. Overall her breathing is improved.     Wt Readings from Last 3 Encounters:  03/04/13 145 lb 12.8 oz (66.134 kg)  02/23/13 140 lb (63.504 kg)  02/11/13 146 lb 3.2 oz (66.316 kg)     Past Medical History  Diagnosis Date  . SOB (shortness of breath)   . CAD (coronary artery disease)     Prior LAD stenting with stent thrombosis occurring in a withdrawal phase of her Plavix undertaken because of trigeminal neuralgia and release surgery. It was treated medically.  . Anemia   . CHF (congestive heart failure)   . GERD (gastroesophageal reflux disease)   . Hyperlipidemia   . Osteoarthritis   . Ischemic cardiomyopathy   . Biventricular implantable cardiac defibrillator in situ     St. Jude implanted 2009  . Complication of anesthesia     massive heart attack  . Cancer     small bladder cancer removed no  treatment  . Cataract     Past Surgical History  Procedure Laterality Date  . Brain surgery      x3  . Appendectomy    . Joint replacement      thumbs and knees replacement  . Tubal ligation    . Back surgery      x2 back surgeries  . Eye surgery Right 05/24/12    Current Outpatient Prescriptions  Medication Sig Dispense Refill  . aspirin 325 MG EC tablet Take 325 mg by mouth daily.      . carvedilol (COREG) 6.25  MG tablet Take 6.25 mg by mouth 2 (two) times daily with a meal. 1 tab twice a day      . clorazepate (TRANXENE) 3.75 MG tablet Take 1 tablet by mouth 2 (two) times daily as needed.       . diclofenac sodium (VOLTAREN) 1 % GEL Apply 1 application topically 3 (three) times daily. Apply to knees and hands  3 Tube  3  . furosemide (LASIX) 80 MG tablet Take 80 mg by mouth. 2 tabs in am and one in pm      . gabapentin (NEURONTIN) 800 MG tablet Take 800 mg by mouth 4 (four) times daily as needed. For pain      . iron polysaccharides (NIFEREX) 150 MG capsule Take 150 mg by mouth 2 (two) times daily.      Marland Kitchen levothyroxine (SYNTHROID, LEVOTHROID) 125 MCG tablet Take 1 tablet (125 mcg total) by mouth daily.  30 tablet  5  . NITROSTAT 0.4 MG SL tablet Place 0.4 mg under the tongue every 5 (five) minutes as needed. For chest pain      . Oxcarbazepine (TRILEPTAL) 300 MG tablet TAKE 1 TABLET (300 MG TOTAL) BY MOUTH 2 (TWO) TIMES DAILY.  60 tablet  3  . oxyCODONE-acetaminophen (PERCOCET) 10-325 MG per tablet Take 1 tablet  by mouth every 6 (six) hours as needed for pain.  90 tablet  0  . potassium chloride SA (KLOR-CON M20) 20 MEQ tablet Take 1 tablet (20 mEq total) by mouth 2 (two) times daily.  60 tablet  4  . promethazine (PHENERGAN) 25 MG tablet Take 1 tablet (25 mg total) by mouth every 6 (six) hours as needed. for nasuea  90 tablet  1  . ramipril (ALTACE) 2.5 MG capsule Take 2.5 mg by mouth daily. daily      . simvastatin (ZOCOR) 10 MG tablet Take 10 mg by mouth daily.       Marland Kitchen spironolactone (ALDACTONE) 25 MG tablet Take 25 mg by mouth daily.        Marland Kitchen tobramycin-dexamethasone (TOBRADEX) ophthalmic solution       . traMADol (ULTRAM) 50 MG tablet TAKE 1 TABLET (50 MG TOTAL) BY MOUTH EVERY 6 (SIX) HOURS AS NEEDED.  60 tablet  3  . venlafaxine (EFFEXOR) 75 MG tablet Take 75 mg by mouth 2 (two) times daily.       Marland Kitchen zolpidem (AMBIEN) 10 MG tablet Take 10 mg by mouth at bedtime as needed. For sleep       No  current facility-administered medications for this visit.    Allergies:   No Known Allergies  Social History:  The patient  reports that she has been smoking Cigarettes.  She has a 15 pack-year smoking history. She has never used smokeless tobacco. She reports that she does not drink alcohol or use illicit drugs.   ROS:  Please see the history of present illness.   Positive for depression, cardiomyopathy. No syncope, no fevers, no chest pain, no significant shortness of breath    PHYSICAL EXAM: VS:  BP 104/67  Pulse 65  Ht 5\' 3"  (1.6 m)  Wt 145 lb 12.8 oz (66.134 kg)  BMI 25.83 kg/m2 Well nourished, well developed, in no acute distress HEENT: normal Neck: no JVD Cardiac:  normal S1, S2; RRR; no murmur Lungs:  clear to auscultation bilaterally, no wheezing, rhonchi or rales Abd: soft, nontender, no hepatomegaly Ext: trace edema Skin: warm and dry Neuro: no focal abnormalities noted  EKG:  Ventricular pacing, sinus rhythm, 65.     ASSESSMENT AND PLAN:  1. Chronic systolic heart failure-currently well compensated. Ejection fraction of 20% range. No changes made in medications. NYHA class II symptoms. I reviewed with her the physiology at her request. 2. Biventricular ICD-fairly well synchronized according to review of Dr. Odessa Fleming note. 3. Hypertension-well-controlled. At times, slightly hypotensive. This may exacerbate dizziness. We have cut back her medications on previous visit. Continue as is. 4. We'll see back in 6 months. If lab work not performed at Dr. Johnathan Hausen office, we will repeat then.  Signed, Donato Schultz, MD [facility]  03/04/2013 3:09 PM

## 2013-03-08 ENCOUNTER — Encounter: Payer: Self-pay | Admitting: *Deleted

## 2013-03-17 ENCOUNTER — Other Ambulatory Visit: Payer: Self-pay | Admitting: *Deleted

## 2013-03-17 DIAGNOSIS — M170 Bilateral primary osteoarthritis of knee: Secondary | ICD-10-CM

## 2013-03-17 DIAGNOSIS — Z5181 Encounter for therapeutic drug level monitoring: Secondary | ICD-10-CM

## 2013-03-17 DIAGNOSIS — IMO0002 Reserved for concepts with insufficient information to code with codable children: Secondary | ICD-10-CM

## 2013-03-17 DIAGNOSIS — Z79899 Other long term (current) drug therapy: Secondary | ICD-10-CM

## 2013-03-17 DIAGNOSIS — M1710 Unilateral primary osteoarthritis, unspecified knee: Secondary | ICD-10-CM

## 2013-03-17 DIAGNOSIS — G500 Trigeminal neuralgia: Secondary | ICD-10-CM

## 2013-03-17 MED ORDER — OXYCODONE-ACETAMINOPHEN 10-325 MG PO TABS: 1.0000 | ORAL_TABLET | Freq: Four times a day (QID) | ORAL | Status: DC | PRN

## 2013-03-17 MED ORDER — TRAMADOL HCL 50 MG PO TABS: 50.0000 mg | ORAL_TABLET | Freq: Four times a day (QID) | ORAL | Status: DC | PRN

## 2013-03-17 NOTE — Telephone Encounter (Signed)
RX printed early for controlled medication for the visit with RN on 03/28/13 (to be signed by MD) 

## 2013-03-28 ENCOUNTER — Encounter: Payer: Self-pay | Admitting: Internal Medicine

## 2013-03-28 ENCOUNTER — Encounter: Payer: Medicare Other | Attending: Physical Medicine & Rehabilitation | Admitting: *Deleted

## 2013-03-28 ENCOUNTER — Encounter: Payer: Self-pay | Admitting: *Deleted

## 2013-03-28 VITALS — BP 107/62 | HR 87 | Resp 14

## 2013-03-28 DIAGNOSIS — M170 Bilateral primary osteoarthritis of knee: Secondary | ICD-10-CM

## 2013-03-28 DIAGNOSIS — G500 Trigeminal neuralgia: Secondary | ICD-10-CM | POA: Insufficient documentation

## 2013-03-28 DIAGNOSIS — M961 Postlaminectomy syndrome, not elsewhere classified: Secondary | ICD-10-CM | POA: Insufficient documentation

## 2013-03-28 DIAGNOSIS — M1710 Unilateral primary osteoarthritis, unspecified knee: Secondary | ICD-10-CM | POA: Insufficient documentation

## 2013-03-28 DIAGNOSIS — I2589 Other forms of chronic ischemic heart disease: Secondary | ICD-10-CM | POA: Insufficient documentation

## 2013-03-28 NOTE — Progress Notes (Signed)
Here for pill count and medication refills. Oxycodone apap 10-325 # 90  Fill date  03/15/13  Today NV#43  Had a fall at home and hit her head and injured her eye.  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  Sees Dr Lovell Sheehan with [facility] and has been seeing weekly.  Says it is 30% healed now.  No other changes. Med list unchanged.  Pain level today is 6.  Follow up one month with RN for pill count and med refill and 2 month with Dr Riley Kill.

## 2013-03-28 NOTE — Patient Instructions (Signed)
Follow up one month with RN for pill count and med refill 

## 2013-04-04 ENCOUNTER — Ambulatory Visit: Payer: Medicare Other | Admitting: *Deleted

## 2013-04-04 LAB — MDC_IDC_ENUM_SESS_TYPE_REMOTE
Battery Remaining Longevity: 0 mo
Brady Statistic AP VP Percent: 13 %
Brady Statistic AS VS Percent: 2.7 %
Date Time Interrogation Session: 20150105071833
HIGH POWER IMPEDANCE MEASURED VALUE: 38 Ohm
Implantable Pulse Generator Serial Number: 544456
Lead Channel Impedance Value: 300 Ohm
Lead Channel Impedance Value: 440 Ohm
Lead Channel Pacing Threshold Amplitude: 1 V
Lead Channel Pacing Threshold Amplitude: 1 V
Lead Channel Pacing Threshold Pulse Width: 0.5 ms
Lead Channel Sensing Intrinsic Amplitude: 12 mV
Lead Channel Sensing Intrinsic Amplitude: 2.2 mV
Lead Channel Setting Pacing Amplitude: 2 V
Lead Channel Setting Pacing Amplitude: 2.5 V
Lead Channel Setting Pacing Pulse Width: 0.5 ms
Lead Channel Setting Pacing Pulse Width: 0.5 ms
Lead Channel Setting Sensing Sensitivity: 0.3 mV
MDC IDC MSMT BATTERY VOLTAGE: 2.44 V
MDC IDC MSMT LEADCHNL LV PACING THRESHOLD PULSEWIDTH: 0.5 ms
MDC IDC MSMT LEADCHNL RA PACING THRESHOLD AMPLITUDE: 0.75 V
MDC IDC MSMT LEADCHNL RA PACING THRESHOLD PULSEWIDTH: 0.5 ms
MDC IDC MSMT LEADCHNL RV IMPEDANCE VALUE: 360 Ohm
MDC IDC SET LEADCHNL RA PACING AMPLITUDE: 2 V
MDC IDC SET ZONE DETECTION INTERVAL: 350 ms
MDC IDC STAT BRADY AP VS PERCENT: 1 %
MDC IDC STAT BRADY AS VP PERCENT: 82 %
MDC IDC STAT BRADY RA PERCENT PACED: 12 %
Zone Setting Detection Interval: 300 ms
Zone Setting Detection Interval: 400 ms

## 2013-04-12 ENCOUNTER — Telehealth: Payer: Self-pay | Admitting: Internal Medicine

## 2013-04-12 ENCOUNTER — Encounter: Payer: Medicare Other | Admitting: Internal Medicine

## 2013-04-12 NOTE — Telephone Encounter (Addendum)
Pt rescheduled with Ileene Hutchinson, [facility] 1/21 at 3pm.   Pt agreeable to plan.

## 2013-04-12 NOTE — Telephone Encounter (Signed)
New Prob    Pt rescheduled her appt with Dr. Caryl Comes to late February due to an appointment conflict. Pt states Dr. Caryl Comes planned to speak to her regarding replacing her Defib. She wants to make sure this conversation can wait until the end of Feb. Please call.

## 2013-04-19 ENCOUNTER — Other Ambulatory Visit: Payer: Self-pay | Admitting: *Deleted

## 2013-04-19 DIAGNOSIS — Z5181 Encounter for therapeutic drug level monitoring: Secondary | ICD-10-CM

## 2013-04-19 DIAGNOSIS — M1710 Unilateral primary osteoarthritis, unspecified knee: Secondary | ICD-10-CM

## 2013-04-19 DIAGNOSIS — Z79899 Other long term (current) drug therapy: Secondary | ICD-10-CM

## 2013-04-19 DIAGNOSIS — G500 Trigeminal neuralgia: Secondary | ICD-10-CM

## 2013-04-19 DIAGNOSIS — M170 Bilateral primary osteoarthritis of knee: Secondary | ICD-10-CM

## 2013-04-19 DIAGNOSIS — IMO0002 Reserved for concepts with insufficient information to code with codable children: Secondary | ICD-10-CM

## 2013-04-19 MED ORDER — OXYCODONE-ACETAMINOPHEN 10-325 MG PO TABS: 1.0000 | ORAL_TABLET | Freq: Four times a day (QID) | ORAL | Status: DC | PRN

## 2013-04-19 NOTE — Telephone Encounter (Signed)
RX printed early for controlled medication for the visit with RN on 04/25/13 (to be signed by MD)

## 2013-04-20 ENCOUNTER — Encounter: Payer: Medicare Other | Admitting: Cardiology

## 2013-04-25 ENCOUNTER — Encounter: Payer: Medicare Other | Admitting: *Deleted

## 2013-04-28 ENCOUNTER — Other Ambulatory Visit: Payer: Self-pay

## 2013-04-28 MED ORDER — SPIRONOLACTONE 25 MG PO TABS: 25.0000 mg | ORAL_TABLET | Freq: Every day | ORAL | Status: DC

## 2013-05-03 ENCOUNTER — Encounter: Payer: Self-pay | Admitting: *Deleted

## 2013-05-03 ENCOUNTER — Ambulatory Visit (INDEPENDENT_AMBULATORY_CARE_PROVIDER_SITE_OTHER): Payer: Medicare Other | Admitting: Cardiology

## 2013-05-03 ENCOUNTER — Encounter: Payer: Self-pay | Admitting: Cardiology

## 2013-05-03 VITALS — BP 115/70 | HR 74 | Wt 147.0 lb

## 2013-05-03 DIAGNOSIS — I5022 Chronic systolic (congestive) heart failure: Secondary | ICD-10-CM

## 2013-05-03 DIAGNOSIS — I2510 Atherosclerotic heart disease of native coronary artery without angina pectoris: Secondary | ICD-10-CM

## 2013-05-03 DIAGNOSIS — Z4502 Encounter for adjustment and management of automatic implantable cardiac defibrillator: Secondary | ICD-10-CM

## 2013-05-03 DIAGNOSIS — I2589 Other forms of chronic ischemic heart disease: Secondary | ICD-10-CM

## 2013-05-03 DIAGNOSIS — Z95810 Presence of automatic (implantable) cardiac defibrillator: Secondary | ICD-10-CM

## 2013-05-03 DIAGNOSIS — I255 Ischemic cardiomyopathy: Secondary | ICD-10-CM

## 2013-05-03 LAB — MDC_IDC_ENUM_SESS_TYPE_INCLINIC
Battery Remaining Longevity: 0 mo
Battery Voltage: 2.42 V
Brady Statistic RV Percent Paced: 95 %
HighPow Impedance: 36 Ohm
Implantable Pulse Generator Serial Number: 544456
Lead Channel Impedance Value: 300 Ohm
Lead Channel Pacing Threshold Amplitude: 0.75 V
Lead Channel Pacing Threshold Amplitude: 1 V
Lead Channel Pacing Threshold Amplitude: 1.75 V
Lead Channel Pacing Threshold Pulse Width: 0.5 ms
Lead Channel Pacing Threshold Pulse Width: 0.5 ms
Lead Channel Pacing Threshold Pulse Width: 0.8 ms
Lead Channel Sensing Intrinsic Amplitude: 2.9 mV
Lead Channel Setting Pacing Amplitude: 2 V
Lead Channel Setting Pacing Pulse Width: 0.8 ms
MDC IDC MSMT LEADCHNL LV IMPEDANCE VALUE: 437.5 Ohm
MDC IDC MSMT LEADCHNL LV PACING THRESHOLD AMPLITUDE: 1 V
MDC IDC MSMT LEADCHNL LV PACING THRESHOLD PULSEWIDTH: 0.5 ms
MDC IDC MSMT LEADCHNL LV PACING THRESHOLD PULSEWIDTH: 0.5 ms
MDC IDC MSMT LEADCHNL RA PACING THRESHOLD AMPLITUDE: 0.75 V
MDC IDC MSMT LEADCHNL RA PACING THRESHOLD PULSEWIDTH: 0.5 ms
MDC IDC MSMT LEADCHNL RV IMPEDANCE VALUE: 362.5 Ohm
MDC IDC MSMT LEADCHNL RV PACING THRESHOLD AMPLITUDE: 1.25 V
MDC IDC MSMT LEADCHNL RV SENSING INTR AMPL: 12 mV
MDC IDC SESS DTM: 20150203171646
MDC IDC SET LEADCHNL LV PACING AMPLITUDE: 2 V
MDC IDC SET LEADCHNL LV PACING PULSEWIDTH: 0.5 ms
MDC IDC SET LEADCHNL RV PACING AMPLITUDE: 2.5 V
MDC IDC SET LEADCHNL RV SENSING SENSITIVITY: 0.3 mV
MDC IDC SET ZONE DETECTION INTERVAL: 300 ms
MDC IDC SET ZONE DETECTION INTERVAL: 400 ms
MDC IDC STAT BRADY RA PERCENT PACED: 12 %
Zone Setting Detection Interval: 350 ms

## 2013-05-03 NOTE — Progress Notes (Signed)
 Patient ID: [name] MRN: [id number], DOB/AGE: 70/05/1945   Date of Visit: 05/03/2013  Primary Physician: HARRIS, WILLIAM, MD Primary Cardiologist: Mark Skains, MD Primary EP: Steve Klein, MD Reason for Visit: EP/device follow-up  History of Present Illness  [name] is a 67 y.o. female with an ischemic CM, EF 20-25%, chronic systolic HF s/p CRT-D implant 2009 and CAD who presents today for routine electrophysiology followup. Her battery is at [facility].  Since last being seen in our clinic, she reports she is doing well and has no complaints. She has intermittent LE swelling but denies worsening and states it usually resolves within one day. She sleeps on 2-3 pillows chronically and has done this for years. She denies chest pain or shortness of breath. She denies palpitations, dizziness, near syncope or syncope. She denies PND or recent weight gain. She is compliant and tolerating medications without difficulty.  Past Medical History Past Medical History  Diagnosis Date  . SOB (shortness of breath)   . CAD (coronary artery disease)     Prior LAD stenting with stent thrombosis occurring in a withdrawal phase of her Plavix undertaken because of trigeminal neuralgia and release surgery. It was treated medically.  . Anemia   . CHF (congestive heart failure)   . GERD (gastroesophageal reflux disease)   . Hyperlipidemia   . Osteoarthritis   . Ischemic cardiomyopathy   . Biventricular implantable cardiac defibrillator in situ     St. Jude implanted 2009  . Complication of anesthesia     massive heart attack  . Cancer     small bladder cancer removed no  treatment  . Cataract     Past Surgical History Past Surgical History  Procedure Laterality Date  . Brain surgery      x3  . Appendectomy    . Joint replacement      thumbs and knees replacement  . Tubal ligation    . Back surgery      x2 back surgeries  . Eye surgery Right 05/24/12    Allergies/Intolerances No Known  Allergies  Current Home Medications Current Outpatient Prescriptions  Medication Sig Dispense Refill  . aspirin 325 MG EC tablet Take 325 mg by mouth daily.      . carvedilol (COREG) 6.25 MG tablet Take 6.25 mg by mouth 2 (two) times daily with a meal. 1 tab twice a day      . clorazepate (TRANXENE) 3.75 MG tablet Take 1 tablet by mouth 2 (two) times daily as needed.       . diclofenac sodium (VOLTAREN) 1 % GEL Apply 1 application topically 3 (three) times daily. Apply to knees and hands  3 Tube  3  . furosemide (LASIX) 80 MG tablet Take 80 mg by mouth. 2 tabs in am and one in pm      . gabapentin (NEURONTIN) 800 MG tablet Take 800 mg by mouth 4 (four) times daily as needed. For pain      . iron polysaccharides (NIFEREX) 150 MG capsule Take 150 mg by mouth 2 (two) times daily.      . levothyroxine (SYNTHROID, LEVOTHROID) 125 MCG tablet Take 1 tablet (125 mcg total) by mouth daily.  30 tablet  5  . NITROSTAT 0.4 MG SL tablet Place 0.4 mg under the tongue every 5 (five) minutes as needed. For chest pain      . Oxcarbazepine (TRILEPTAL) 300 MG tablet TAKE 1 TABLET (300 MG TOTAL) BY MOUTH 2 (TWO) TIMES DAILY.    60 tablet  3  . oxyCODONE-acetaminophen (PERCOCET) 10-325 MG per tablet Take 1 tablet by mouth every 6 (six) hours as needed for pain.  90 tablet  0  . potassium chloride SA (KLOR-CON M20) 20 MEQ tablet Take 1 tablet (20 mEq total) by mouth 2 (two) times daily.  60 tablet  4  . promethazine (PHENERGAN) 25 MG tablet Take 1 tablet (25 mg total) by mouth every 6 (six) hours as needed. for nasuea  90 tablet  1  . ramipril (ALTACE) 2.5 MG capsule Take 2.5 mg by mouth daily. daily      . simvastatin (ZOCOR) 10 MG tablet Take 10 mg by mouth daily.       Marland Kitchen spironolactone (ALDACTONE) 25 MG tablet Take 1 tablet (25 mg total) by mouth daily.  30 tablet  10  . tobramycin-dexamethasone (TOBRADEX) ophthalmic solution       . traMADol (ULTRAM) 50 MG tablet Take 1 tablet (50 mg total) by mouth every 6 (six)  hours as needed.  60 tablet  3  . venlafaxine (EFFEXOR) 75 MG tablet Take 75 mg by mouth 2 (two) times daily.       Marland Kitchen zolpidem (AMBIEN) 10 MG tablet Take 10 mg by mouth at bedtime as needed. For sleep       No current facility-administered medications for this visit.    Social History History   Social History  . Marital Status: Legally Separated    Spouse Name: N/A    Number of Children: N/A  . Years of Education: N/A   Occupational History  . Not on file.   Social History Main Topics  . Smoking status: Current Every Day Smoker -- 0.50 packs/day for 30 years    Types: Cigarettes    Last Attempt to Quit: 03/14/2011  . Smokeless tobacco: Never Used  . Alcohol Use: No  . Drug Use: No  . Sexual Activity: No   Other Topics Concern  . Not on file   Social History Narrative  . No narrative on file     Review of Systems General: No chills, fever, night sweats or weight changes Cardiovascular: No chest pain, dyspnea on exertion, edema, orthopnea, palpitations, paroxysmal nocturnal dyspnea Dermatological: No rash, lesions or masses Respiratory: No cough, dyspnea Urologic: No hematuria, dysuria Abdominal: No nausea, vomiting, diarrhea, bright red blood per rectum, melena, or hematemesis Neurologic: No visual changes, weakness, changes in mental status All other systems reviewed and are otherwise negative except as noted above.  Physical Exam Vitals: Blood pressure 115/70, pulse 74, weight 147 lb (66.679 kg).  General: Well developed, well appearing 68 y.o. female in no acute distress. HEENT: Normocephalic, atraumatic. EOMs intact. Sclera nonicteric. Oropharynx clear.  Neck: Supple without bruits. No JVD. Lungs: Respirations regular and unlabored, CTA bilaterally. No wheezes, rales or rhonchi. Heart: RRR. S1, S2 present. No murmurs, rub, S3 or S4. Abdomen: Soft, non-tender, non-distended. BS present x 4 quadrants. No hepatosplenomegaly.  Extremities: No clubbing, cyanosis or  edema. DP/PT/Radials 2+ and equal bilaterally. Psych: Normal affect. Neuro: Alert and oriented X 3. Moves all extremities spontaneously.   Diagnostics 12-lead ECG today - A sensed V paced at 74 bpm; QRS 118 Device interrogation today - Normal device function. Battery at [facility] since 04/04/2013. Stable lead impedances, sensing and thresholds. BiV paced 95% of time. No ventricular arrhythmias. Mode switch <1%. AT/AF burden 0%. No programming changes made. See PaceArt report for full details.   Assessment and Plan  1. Ischemic CM, EF 20-25%  by echo March 2014 with chronic systolic HF s/p BiV ICD implant 2009 for CRT and primary prevention SCD - battery at [facility] since 04/04/2013 otherwise normal device function  - no programming changes made  - discussed need for BiV ICD generator change  - risks, benefits and alternatives to BiV ICD generator change were discussed in detail; these risks include, but are not limited to, lead dislodgement, bleeding and infection; this will be scheduled with Dr. Caryl Comes at the next available time  - as required for NCDR ICD registry, will update echo - she is euvolemic and will continue medical therapy with Coreg and ACEI for chronic systolic HF  2. CAD - stable  - continue medical therapy  Signed, Ileene Hutchinson, PA-C 05/03/2013, 3:55 PM

## 2013-05-03 NOTE — Patient Instructions (Addendum)
Your Physician recommends you have a Generator Change 05/11/13 at 4:00 pm   Your physician has requested that you have an echocardiogram 05/05/13 at 8:30 am  Echocardiography is a painless test that uses sound waves to create images of your heart. It provides your doctor with information about the size and shape of your heart and how well your heart's chambers and valves are working. This procedure takes approximately one hour. There are no restrictions for this procedure.     Your physician recommends that you have lab work today: BMET,CBC,INR     Your physician recommends that you continue on your current medications as directed. Please refer to the Current Medication list given to you today.

## 2013-05-04 LAB — CBC WITH DIFFERENTIAL/PLATELET
BASOS PCT: 0.1 % (ref 0.0–3.0)
Basophils Absolute: 0 10*3/uL (ref 0.0–0.1)
EOS PCT: 1.6 % (ref 0.0–5.0)
Eosinophils Absolute: 0.1 10*3/uL (ref 0.0–0.7)
HEMATOCRIT: 36 % (ref 36.0–46.0)
Hemoglobin: 11.6 g/dL — ABNORMAL LOW (ref 12.0–15.0)
Lymphocytes Relative: 28.8 % (ref 12.0–46.0)
Lymphs Abs: 2.6 10*3/uL (ref 0.7–4.0)
MCHC: 32.1 g/dL (ref 30.0–36.0)
MCV: 94.3 fl (ref 78.0–100.0)
Monocytes Absolute: 1.1 10*3/uL — ABNORMAL HIGH (ref 0.1–1.0)
Monocytes Relative: 11.5 % (ref 3.0–12.0)
Neutro Abs: 5.3 10*3/uL (ref 1.4–7.7)
Neutrophils Relative %: 58 % (ref 43.0–77.0)
Platelets: 270 10*3/uL (ref 150.0–400.0)
RBC: 3.81 Mil/uL — AB (ref 3.87–5.11)
RDW: 16.3 % — ABNORMAL HIGH (ref 11.5–14.6)
WBC: 9.2 10*3/uL (ref 4.5–10.5)

## 2013-05-04 LAB — BASIC METABOLIC PANEL
BUN: 14 mg/dL (ref 6–23)
CHLORIDE: 102 meq/L (ref 96–112)
CO2: 31 meq/L (ref 19–32)
Calcium: 9.3 mg/dL (ref 8.4–10.5)
Creatinine, Ser: 0.7 mg/dL (ref 0.4–1.2)
GFR: 82.68 mL/min (ref >60.00)
Glucose, Bld: 73 mg/dL (ref 70–99)
Potassium: 4.4 mEq/L (ref 3.5–5.1)
Sodium: 137 mEq/L (ref 135–145)

## 2013-05-04 LAB — PROTIME-INR
INR: 1.1 ratio — ABNORMAL HIGH (ref 0.8–1.0)
Prothrombin Time: 11.1 s (ref 10.2–12.4)

## 2013-05-05 ENCOUNTER — Ambulatory Visit (HOSPITAL_COMMUNITY): Payer: Medicare Other | Attending: Internal Medicine | Admitting: Radiology

## 2013-05-05 DIAGNOSIS — I509 Heart failure, unspecified: Secondary | ICD-10-CM | POA: Insufficient documentation

## 2013-05-05 DIAGNOSIS — I379 Nonrheumatic pulmonary valve disorder, unspecified: Secondary | ICD-10-CM | POA: Insufficient documentation

## 2013-05-05 DIAGNOSIS — F17200 Nicotine dependence, unspecified, uncomplicated: Secondary | ICD-10-CM | POA: Insufficient documentation

## 2013-05-05 DIAGNOSIS — I2589 Other forms of chronic ischemic heart disease: Secondary | ICD-10-CM | POA: Insufficient documentation

## 2013-05-05 DIAGNOSIS — I255 Ischemic cardiomyopathy: Secondary | ICD-10-CM

## 2013-05-05 DIAGNOSIS — I059 Rheumatic mitral valve disease, unspecified: Secondary | ICD-10-CM | POA: Insufficient documentation

## 2013-05-05 DIAGNOSIS — E785 Hyperlipidemia, unspecified: Secondary | ICD-10-CM | POA: Insufficient documentation

## 2013-05-05 DIAGNOSIS — I2510 Atherosclerotic heart disease of native coronary artery without angina pectoris: Secondary | ICD-10-CM | POA: Insufficient documentation

## 2013-05-05 DIAGNOSIS — I079 Rheumatic tricuspid valve disease, unspecified: Secondary | ICD-10-CM | POA: Insufficient documentation

## 2013-05-05 NOTE — Progress Notes (Signed)
Echocardiogram performed.  

## 2013-05-09 ENCOUNTER — Encounter: Payer: Medicare Other | Admitting: Internal Medicine

## 2013-05-10 MED ORDER — SODIUM CHLORIDE 0.9 % IR SOLN
80.0000 mg | Status: AC
  Filled 2013-05-10: qty 2

## 2013-05-10 MED ORDER — CEFAZOLIN SODIUM-DEXTROSE 2-3 GM-% IV SOLR
2.0000 g | INTRAVENOUS | Status: AC
  Filled 2013-05-10: qty 50

## 2013-05-10 MED ORDER — SODIUM CHLORIDE 0.9 % IV SOLN
INTRAVENOUS | Status: DC
  Administered 2013-05-11: 15:00:00 via INTRAVENOUS

## 2013-05-11 ENCOUNTER — Encounter (HOSPITAL_COMMUNITY)
Admission: RE | Disposition: A | Discharge status: Home / Self Care | Payer: Self-pay | Source: Ambulatory Visit | Attending: Internal Medicine

## 2013-05-11 ENCOUNTER — Ambulatory Visit (HOSPITAL_COMMUNITY)
Admission: RE | Admit: 2013-05-11 | Discharge: 2013-05-11 | Disposition: A | Discharge status: Home / Self Care | Payer: Medicare Other | Source: Ambulatory Visit | Attending: Internal Medicine | Admitting: Internal Medicine

## 2013-05-11 DIAGNOSIS — I5022 Chronic systolic (congestive) heart failure: Secondary | ICD-10-CM

## 2013-05-11 DIAGNOSIS — I2589 Other forms of chronic ischemic heart disease: Secondary | ICD-10-CM

## 2013-05-11 DIAGNOSIS — Z7982 Long term (current) use of aspirin: Secondary | ICD-10-CM | POA: Insufficient documentation

## 2013-05-11 DIAGNOSIS — F17200 Nicotine dependence, unspecified, uncomplicated: Secondary | ICD-10-CM | POA: Insufficient documentation

## 2013-05-11 DIAGNOSIS — M1990 Unspecified osteoarthritis, unspecified site: Secondary | ICD-10-CM | POA: Insufficient documentation

## 2013-05-11 DIAGNOSIS — H269 Unspecified cataract: Secondary | ICD-10-CM | POA: Insufficient documentation

## 2013-05-11 DIAGNOSIS — I255 Ischemic cardiomyopathy: Secondary | ICD-10-CM

## 2013-05-11 DIAGNOSIS — K219 Gastro-esophageal reflux disease without esophagitis: Secondary | ICD-10-CM | POA: Insufficient documentation

## 2013-05-11 DIAGNOSIS — Z4502 Encounter for adjustment and management of automatic implantable cardiac defibrillator: Secondary | ICD-10-CM | POA: Insufficient documentation

## 2013-05-11 DIAGNOSIS — I509 Heart failure, unspecified: Secondary | ICD-10-CM | POA: Insufficient documentation

## 2013-05-11 DIAGNOSIS — E785 Hyperlipidemia, unspecified: Secondary | ICD-10-CM | POA: Insufficient documentation

## 2013-05-11 DIAGNOSIS — Z9861 Coronary angioplasty status: Secondary | ICD-10-CM | POA: Insufficient documentation

## 2013-05-11 DIAGNOSIS — Z95810 Presence of automatic (implantable) cardiac defibrillator: Secondary | ICD-10-CM

## 2013-05-11 DIAGNOSIS — Z8551 Personal history of malignant neoplasm of bladder: Secondary | ICD-10-CM | POA: Insufficient documentation

## 2013-05-11 DIAGNOSIS — I2510 Atherosclerotic heart disease of native coronary artery without angina pectoris: Secondary | ICD-10-CM

## 2013-05-11 HISTORY — PX: BIV PACEMAKER GENERATOR CHANGE OUT: SHX5746

## 2013-05-11 LAB — SURGICAL PCR SCREEN
MRSA, PCR: NEGATIVE
Staphylococcus aureus: NEGATIVE

## 2013-05-11 SURGERY — BIV PACEMAKER GENERATOR CHANGE OUT
Anesthesia: Moderate Sedation | Laterality: Bilateral

## 2013-05-11 MED ORDER — MIDAZOLAM HCL 5 MG/5ML IJ SOLN
INTRAMUSCULAR | Status: AC
  Filled 2013-05-11: qty 5

## 2013-05-11 MED ORDER — LIDOCAINE HCL (PF) 1 % IJ SOLN
INTRAMUSCULAR | Status: AC
  Filled 2013-05-11: qty 60

## 2013-05-11 MED ORDER — FENTANYL CITRATE 0.05 MG/ML IJ SOLN
INTRAMUSCULAR | Status: AC
  Filled 2013-05-11: qty 2

## 2013-05-11 MED ORDER — MUPIROCIN 2 % EX OINT
TOPICAL_OINTMENT | CUTANEOUS | Status: AC
  Administered 2013-05-11: 1 via NASAL
  Filled 2013-05-11: qty 22

## 2013-05-11 MED ORDER — SODIUM CHLORIDE 0.9 % IV SOLN: INTRAVENOUS | Status: DC

## 2013-05-11 MED ORDER — CHLORHEXIDINE GLUCONATE 4 % EX LIQD: 60.0000 mL | Freq: Once | CUTANEOUS | Status: DC

## 2013-05-11 MED ORDER — ACETAMINOPHEN 325 MG PO TABS
325.0000 mg | ORAL_TABLET | ORAL | Status: DC | PRN
  Administered 2013-05-11: 650 mg via ORAL
  Filled 2013-05-11 (×2): qty 2

## 2013-05-11 MED ORDER — HEPARIN (PORCINE) IN NACL 2-0.9 UNIT/ML-% IJ SOLN
INTRAMUSCULAR | Status: AC
  Filled 2013-05-11: qty 1000

## 2013-05-11 MED ORDER — MUPIROCIN 2 % EX OINT
TOPICAL_OINTMENT | Freq: Two times a day (BID) | CUTANEOUS | Status: DC
  Administered 2013-05-11: 1 via NASAL
  Filled 2013-05-11: qty 22

## 2013-05-11 MED ORDER — ONDANSETRON HCL 4 MG/2ML IJ SOLN: 4.0000 mg | Freq: Four times a day (QID) | INTRAMUSCULAR | Status: DC | PRN

## 2013-05-11 NOTE — Progress Notes (Signed)
Discharge instruction given per MD order.  Pt and Cg able to verbalize instruction.  Pt to car via wheelchair.

## 2013-05-11 NOTE — H&P (View-Only) (Signed)
Patient ID: [name] MRN: [id number], DOB/AGE: 1945-12-15   Date of Visit: 05/03/2013  Primary Physician: Shirline Frees, MD Primary Cardiologist: Candee Furbish, MD Primary EP: Jolyn Nap, MD Reason for Visit: EP/device follow-up  History of Present Illness  [name] is a 68 y.o. female with an ischemic CM, EF 0000000, chronic systolic HF s/p CRT-D implant 2009 and CAD who presents today for routine electrophysiology followup. Her battery is at [facility].  Since last being seen in our clinic, she reports she is doing well and has no complaints. She has intermittent LE swelling but denies worsening and states it usually resolves within one day. She sleeps on 2-3 pillows chronically and has done this for years. She denies chest pain or shortness of breath. She denies palpitations, dizziness, near syncope or syncope. She denies PND or recent weight gain. She is compliant and tolerating medications without difficulty.  Past Medical History Past Medical History  Diagnosis Date  . SOB (shortness of breath)   . CAD (coronary artery disease)     Prior LAD stenting with stent thrombosis occurring in a withdrawal phase of her Plavix undertaken because of trigeminal neuralgia and release surgery. It was treated medically.  . Anemia   . CHF (congestive heart failure)   . GERD (gastroesophageal reflux disease)   . Hyperlipidemia   . Osteoarthritis   . Ischemic cardiomyopathy   . Biventricular implantable cardiac defibrillator in situ     St. Jude implanted 2009  . Complication of anesthesia     massive heart attack  . Cancer     small bladder cancer removed no  treatment  . Cataract     Past Surgical History Past Surgical History  Procedure Laterality Date  . Brain surgery      x3  . Appendectomy    . Joint replacement      thumbs and knees replacement  . Tubal ligation    . Back surgery      x2 back surgeries  . Eye surgery Right 05/24/12    Allergies/Intolerances No Known  Allergies  Current Home Medications Current Outpatient Prescriptions  Medication Sig Dispense Refill  . aspirin 325 MG EC tablet Take 325 mg by mouth daily.      . carvedilol (COREG) 6.25 MG tablet Take 6.25 mg by mouth 2 (two) times daily with a meal. 1 tab twice a day      . clorazepate (TRANXENE) 3.75 MG tablet Take 1 tablet by mouth 2 (two) times daily as needed.       . diclofenac sodium (VOLTAREN) 1 % GEL Apply 1 application topically 3 (three) times daily. Apply to knees and hands  3 Tube  3  . furosemide (LASIX) 80 MG tablet Take 80 mg by mouth. 2 tabs in am and one in pm      . gabapentin (NEURONTIN) 800 MG tablet Take 800 mg by mouth 4 (four) times daily as needed. For pain      . iron polysaccharides (NIFEREX) 150 MG capsule Take 150 mg by mouth 2 (two) times daily.      Marland Kitchen levothyroxine (SYNTHROID, LEVOTHROID) 125 MCG tablet Take 1 tablet (125 mcg total) by mouth daily.  30 tablet  5  . NITROSTAT 0.4 MG SL tablet Place 0.4 mg under the tongue every 5 (five) minutes as needed. For chest pain      . Oxcarbazepine (TRILEPTAL) 300 MG tablet TAKE 1 TABLET (300 MG TOTAL) BY MOUTH 2 (TWO) TIMES DAILY.  60 tablet  3  . oxyCODONE-acetaminophen (PERCOCET) 10-325 MG per tablet Take 1 tablet by mouth every 6 (six) hours as needed for pain.  90 tablet  0  . potassium chloride SA (KLOR-CON M20) 20 MEQ tablet Take 1 tablet (20 mEq total) by mouth 2 (two) times daily.  60 tablet  4  . promethazine (PHENERGAN) 25 MG tablet Take 1 tablet (25 mg total) by mouth every 6 (six) hours as needed. for nasuea  90 tablet  1  . ramipril (ALTACE) 2.5 MG capsule Take 2.5 mg by mouth daily. daily      . simvastatin (ZOCOR) 10 MG tablet Take 10 mg by mouth daily.       Marland Kitchen spironolactone (ALDACTONE) 25 MG tablet Take 1 tablet (25 mg total) by mouth daily.  30 tablet  10  . tobramycin-dexamethasone (TOBRADEX) ophthalmic solution       . traMADol (ULTRAM) 50 MG tablet Take 1 tablet (50 mg total) by mouth every 6 (six)  hours as needed.  60 tablet  3  . venlafaxine (EFFEXOR) 75 MG tablet Take 75 mg by mouth 2 (two) times daily.       Marland Kitchen zolpidem (AMBIEN) 10 MG tablet Take 10 mg by mouth at bedtime as needed. For sleep       No current facility-administered medications for this visit.    Social History History   Social History  . Marital Status: Legally Separated    Spouse Name: N/A    Number of Children: N/A  . Years of Education: N/A   Occupational History  . Not on file.   Social History Main Topics  . Smoking status: Current Every Day Smoker -- 0.50 packs/day for 30 years    Types: Cigarettes    Last Attempt to Quit: 03/14/2011  . Smokeless tobacco: Never Used  . Alcohol Use: No  . Drug Use: No  . Sexual Activity: No   Other Topics Concern  . Not on file   Social History Narrative  . No narrative on file     Review of Systems General: No chills, fever, night sweats or weight changes Cardiovascular: No chest pain, dyspnea on exertion, edema, orthopnea, palpitations, paroxysmal nocturnal dyspnea Dermatological: No rash, lesions or masses Respiratory: No cough, dyspnea Urologic: No hematuria, dysuria Abdominal: No nausea, vomiting, diarrhea, bright red blood per rectum, melena, or hematemesis Neurologic: No visual changes, weakness, changes in mental status All other systems reviewed and are otherwise negative except as noted above.  Physical Exam Vitals: Blood pressure 115/70, pulse 74, weight 147 lb (66.679 kg).  General: Well developed, well appearing 68 y.o. female in no acute distress. HEENT: Normocephalic, atraumatic. EOMs intact. Sclera nonicteric. Oropharynx clear.  Neck: Supple without bruits. No JVD. Lungs: Respirations regular and unlabored, CTA bilaterally. No wheezes, rales or rhonchi. Heart: RRR. S1, S2 present. No murmurs, rub, S3 or S4. Abdomen: Soft, non-tender, non-distended. BS present x 4 quadrants. No hepatosplenomegaly.  Extremities: No clubbing, cyanosis or  edema. DP/PT/Radials 2+ and equal bilaterally. Psych: Normal affect. Neuro: Alert and oriented X 3. Moves all extremities spontaneously.   Diagnostics 12-lead ECG today - A sensed V paced at 74 bpm; QRS 118 Device interrogation today - Normal device function. Battery at [facility] since 04/04/2013. Stable lead impedances, sensing and thresholds. BiV paced 95% of time. No ventricular arrhythmias. Mode switch <1%. AT/AF burden 0%. No programming changes made. See PaceArt report for full details.   Assessment and Plan  1. Ischemic CM, EF 20-25%  by echo March 2014 with chronic systolic HF s/p BiV ICD implant 2009 for CRT and primary prevention SCD - battery at [facility] since 04/04/2013 otherwise normal device function  - no programming changes made  - discussed need for BiV ICD generator change  - risks, benefits and alternatives to BiV ICD generator change were discussed in detail; these risks include, but are not limited to, lead dislodgement, bleeding and infection; this will be scheduled with Dr. Caryl Comes at the next available time  - as required for NCDR ICD registry, will update echo - she is euvolemic and will continue medical therapy with Coreg and ACEI for chronic systolic HF  2. CAD - stable  - continue medical therapy  Signed, Ileene Hutchinson, PA-C 05/03/2013, 3:55 PM

## 2013-05-11 NOTE — Discharge Instructions (Signed)
Pacemaker Battery Change, Care After  °Refer to this sheet in the next few weeks. These instructions provide you with information on caring for yourself after your procedure. Your health care provider may also give you more specific instructions. Your treatment has been planned according to current medical practices, but problems sometimes occur. Call your health care provider if you have any problems or questions after your procedure. °WHAT TO EXPECT AFTER THE PROCEDURE °After your procedure, it is typical to have the following sensations: °· Soreness at the pacemaker site. °HOME CARE INSTRUCTIONS  °· Keep the incision clean and dry. °· Unless advised otherwise, you may shower beginning 48 hours after your procedure. °· For the first week after the replacement, avoid stretching motions that pull at the incision site and avoid heavy exercise with the arm on the same side as the incision. °· Only take over-the-counter or prescription medicines for pain, discomfort, or fever as directed by your health care provider. °· Your health care provider will tell you when you will need to next test your pacemaker by telephone or when to return to the office for follow up for removal of stitches. °SEEK MEDICAL CARE IF:  °· You have pain at the incision site that is not relieved by over-the-counter or prescription medicine. °· There is drainage or pus from the incision site. °· There is swelling larger than a lime at the incision site. °· You develop red streaking that extends above or below the incision site. °· You feel brief, intermittent palpitations, lightheadedness, or any symptoms that you feel might be related to your heart. °SEEK IMMEDIATE MEDICAL CARE IF:  °· You experience chest pain that is different than the pain at the pacemaker site. °· Shortness of breath. °· Palpitations or irregular heart beat. °· Lightheadedness that does not go away quickly. °· Fainting. °· You have pain that gets worse and is not relieved by  medicine. °MAKE SURE YOU:  °· Understand these instructions. °· Will watch your condition. °· Will get help right away if you are not doing well or get worse. °Document Released: 01/05/2013 Document Reviewed: 09/29/2012 °[employer]® Patient Information ©2014 [employer]. ° °

## 2013-05-11 NOTE — CV Procedure (Signed)
Preoperative diagnosis  ICM @[facility]   Device migration to the axiolla Postoperative diagnosis same/   Procedure: Generator replacement  pocketrevision  Following informed consent the patient was brought to the electrophysiology laboratory in place of the fluoroscopic table in the supine position after routine prep and drape lidocaine was infiltrated in the region of the previous incision and carried down to later the device pocket using sharp dissection and electrocautery. The pocket was opened the device was freed up and was explanted.  Interrogation of the previously implanted ICD ventricular lead [employer] 7120   demonstrated an R wave of 19.1  millivolts., and impedance of 259 ohms, and a pacing threshold of 1.5 volts at 0.5 msec.     Interrogation of the previously implanted Left ventricular lead [place] 0175  demonstrated an R wave of 9.7  millivolts., and impedance of 608 ohms, and a pacing threshold of 1.3 volts at 0.5 msec.    The previously implanted atrial lead [place] 1025 demonstrated a P-wave amplitude of 2.1 milllivolts  and impedance of  295 ohms, and a pacing threshold of 0.6 volts at @ 0.55milliseconds.  The leads were inspected. Repair was not  needed. The leads were then attached to a St Jude  pulse generator, serial number [id number].    Through the device the P-wave amplitude  Was  1.9 milllivolts and impedance of  290 ohms, and a pacing threshold of 0.75 volts at @ 0.94milliseconds; the ICD ventricular lead demonstrated an R wave of 11.7  millivolts., and impedance of 390 ohms, and a pacing threshold of 1.5 volts at 0.8 msec and the  Left ventricular lead demonstrated an  impedance of 460 ohms, and a pacing threshold of 1.0 volts at 0.5 msec.    High voltage impedances were 37 ohms  Because of migration  The device pocket was opened medially about 2' and the device was secured with two anchoring sutures in this position  surgicell was used both for hemostatic support as well as bulk  to promote stabilization of the device in the new location   The pocket was irrigated with antibiotic containing saline solution hemostasis was assured and the leads and the device were placed in the pocket. The wound was then closed in 2 layers in normal fashion.  The patient tolerated the procedure without apparent complication.  DFT testing was not performed  Virl Axe   \

## 2013-05-11 NOTE — Interval H&P Note (Signed)
History and Physical Interval Note:  05/11/2013 2:44 PM  [name]  has presented today for surgery, with the diagnosis of eol  The various methods of treatment have been discussed with the patient and family. After consideration of risks, benefits and other options for treatment, the patient has consented to  Procedure(s): BIV PACEMAKER GENERATOR CHANGE OUT (Bilateral) as a surgical intervention .  The patient's history has been reviewed, patient examined, no change in status, stable for surgery.  I have reviewed the patient's chart and labs.  Questions were answered to the patient's satisfaction.     Virl Axe  ICD Criteria  Current LVEF (within 6 months):15%  NYHA Functional Classification: Class III  Heart Failure History:  Yes, Duration of heart failure since onset is > 9 months  Non-Ischemic Dilated Cardiomyopathy History:  Yes, timeframe is > 9 months  Atrial Fibrillation/Atrial Flutter:  No.  Ventricular Tachycardia History:  No.  Cardiac Arrest History:  No  History of Syndromes with Risk of Sudden Death:  No.  Previous ICD:  Yes, ICD Type:  CRT-D, Reason for ICD:  Primary prevention.  LVEF is not available  Electrophysiology Study: No.  Prior MI: Yes, Most recent MI timeframe is > 40 days.  PPM: No.  OSA:  No  Patient Life Expectancy of >=1 year: Yes.  Anticoagulation Therapy:  Patient is NOT on anticoagulation therapy.   Beta Blocker Therapy:  Yes.   Ace Inhibitor/ARB Therapy:  Yes.

## 2013-05-12 ENCOUNTER — Telehealth: Payer: Self-pay | Admitting: Internal Medicine

## 2013-05-12 NOTE — Telephone Encounter (Signed)
Follow Up  Pt called states that she fell during the night on her defib// she says that it hurts, but she is not sure if it is ok or not// Please call back to discuss.

## 2013-05-12 NOTE — Telephone Encounter (Signed)
New problem   Pt fell last night on her surgical site and need to speak to nurse concerning this. Please call pt.

## 2013-05-12 NOTE — Telephone Encounter (Signed)
Spoke with pt and after lengthy conversation---sharpe pains and has one speck of blood on bandage. Pt was concerned. Advised to keep watch on site and to call with any further problems.

## 2013-05-12 NOTE — Telephone Encounter (Signed)
Routing to device clinic 

## 2013-05-16 ENCOUNTER — Encounter: Payer: Self-pay | Admitting: Internal Medicine

## 2013-05-17 ENCOUNTER — Encounter (HOSPITAL_COMMUNITY): Payer: Self-pay | Admitting: *Deleted

## 2013-05-18 ENCOUNTER — Telehealth: Payer: Self-pay | Admitting: Internal Medicine

## 2013-05-18 NOTE — Telephone Encounter (Signed)
New message        Pt has 2 questions and wants a call back. Pt does not know what to do with the Mupirocin ointment, and if the device check is important for her to keep.

## 2013-05-18 NOTE — Telephone Encounter (Signed)
Informed pt I could not find anywhere the need for Mupirocin (d/c papers did not mention it, and hospital prescreen lab was negative). Also advised pt she needs to keep wound check appt. But if weather/roads keep her from coming in she needs to schedule f/u wound check asap. I further explained that we have a certain numbers of days we like to see pt in office after procedure to help her understand the importance of wound check.  Pt agreeable to plan.

## 2013-05-19 ENCOUNTER — Ambulatory Visit (INDEPENDENT_AMBULATORY_CARE_PROVIDER_SITE_OTHER): Payer: Medicare Other | Admitting: *Deleted

## 2013-05-19 DIAGNOSIS — I5022 Chronic systolic (congestive) heart failure: Secondary | ICD-10-CM

## 2013-05-19 DIAGNOSIS — I255 Ischemic cardiomyopathy: Secondary | ICD-10-CM

## 2013-05-19 DIAGNOSIS — I2589 Other forms of chronic ischemic heart disease: Secondary | ICD-10-CM

## 2013-05-19 LAB — MDC_IDC_ENUM_SESS_TYPE_INCLINIC
Brady Statistic RV Percent Paced: 96 %
Date Time Interrogation Session: 20150219154834
HighPow Impedance: 34.29 Ohm
Lead Channel Impedance Value: 387.5 Ohm
Lead Channel Impedance Value: 450 Ohm
Lead Channel Pacing Threshold Amplitude: 0.75 V
Lead Channel Pacing Threshold Amplitude: 1 V
Lead Channel Pacing Threshold Amplitude: 1 V
Lead Channel Pacing Threshold Amplitude: 1.25 V
Lead Channel Pacing Threshold Pulse Width: 0.5 ms
Lead Channel Pacing Threshold Pulse Width: 0.5 ms
Lead Channel Pacing Threshold Pulse Width: 0.5 ms
Lead Channel Pacing Threshold Pulse Width: 1 ms
Lead Channel Sensing Intrinsic Amplitude: 3 mV
Lead Channel Setting Pacing Amplitude: 2 V
Lead Channel Setting Pacing Amplitude: 2.5 V
Lead Channel Setting Pacing Pulse Width: 0.5 ms
Lead Channel Setting Pacing Pulse Width: 1 ms
MDC IDC MSMT BATTERY REMAINING LONGEVITY: 69.6 mo
MDC IDC MSMT LEADCHNL LV PACING THRESHOLD PULSEWIDTH: 0.5 ms
MDC IDC MSMT LEADCHNL RA IMPEDANCE VALUE: 275 Ohm
MDC IDC MSMT LEADCHNL RA PACING THRESHOLD AMPLITUDE: 0.75 V
MDC IDC MSMT LEADCHNL RV PACING THRESHOLD AMPLITUDE: 1.25 V
MDC IDC MSMT LEADCHNL RV PACING THRESHOLD PULSEWIDTH: 1 ms
MDC IDC MSMT LEADCHNL RV SENSING INTR AMPL: 11.7 mV
MDC IDC PG SERIAL: 7170199
MDC IDC SET LEADCHNL RA PACING AMPLITUDE: 2 V
MDC IDC SET LEADCHNL RV SENSING SENSITIVITY: 0.5 mV
MDC IDC SET ZONE DETECTION INTERVAL: 350 ms
MDC IDC SET ZONE DETECTION INTERVAL: 400 ms
MDC IDC STAT BRADY RA PERCENT PACED: 7.2 %
Zone Setting Detection Interval: 300 ms

## 2013-05-19 NOTE — Progress Notes (Signed)
Wound check appointment. Wound without redness or edema. Incision edges approximated, wound well healed. Normal device function. Thresholds, sensing, and impedances consistent with implant measurements. Device programmed at appropriate safety margins. Histogram distribution appropriate for patient and level of activity. No mode switches or ventricular arrhythmias noted. Patient educated about wound care, arm mobility, lifting restrictions, shock plan. ROV in 3 months with SK. 

## 2013-05-23 ENCOUNTER — Encounter: Payer: Self-pay | Admitting: Physical Medicine & Rehabilitation

## 2013-05-23 ENCOUNTER — Encounter: Payer: Medicare Other | Attending: Physical Medicine & Rehabilitation | Admitting: Physical Medicine & Rehabilitation

## 2013-05-23 VITALS — BP 101/61 | HR 80 | Resp 14 | Ht 62.5 in | Wt 148.0 lb

## 2013-05-23 DIAGNOSIS — M170 Bilateral primary osteoarthritis of knee: Secondary | ICD-10-CM

## 2013-05-23 DIAGNOSIS — Z79899 Other long term (current) drug therapy: Secondary | ICD-10-CM

## 2013-05-23 DIAGNOSIS — IMO0002 Reserved for concepts with insufficient information to code with codable children: Secondary | ICD-10-CM

## 2013-05-23 DIAGNOSIS — M961 Postlaminectomy syndrome, not elsewhere classified: Secondary | ICD-10-CM | POA: Insufficient documentation

## 2013-05-23 DIAGNOSIS — G500 Trigeminal neuralgia: Secondary | ICD-10-CM | POA: Insufficient documentation

## 2013-05-23 DIAGNOSIS — M1710 Unilateral primary osteoarthritis, unspecified knee: Secondary | ICD-10-CM

## 2013-05-23 DIAGNOSIS — Z5181 Encounter for therapeutic drug level monitoring: Secondary | ICD-10-CM | POA: Insufficient documentation

## 2013-05-23 MED ORDER — OXYCODONE-ACETAMINOPHEN 10-325 MG PO TABS: 1.0000 | ORAL_TABLET | Freq: Four times a day (QID) | ORAL | Status: DC | PRN

## 2013-05-23 NOTE — Patient Instructions (Signed)

## 2013-05-23 NOTE — Progress Notes (Signed)
Subjective:    Patient ID: [name], female    DOB: 02-26-1946, 68 y.o.   MRN: [id number]  HPI  [name] is back regarding her multiple pain complaints. She has been doing fairly well except for her low back which has flared a bit. She fell in early November and hit her right eye. She states that this didn't have an effect on her low back. She also slipped on a bathroom rug a month or two ago which she thinks might have triggered more of her symptoms. Pain is usually in the right low back, a little more tender when she sits or stands for a long period of time. The pain has generally improved over the last few weeks since she slipped.       Pain Inventory Average Pain 3 Pain Right Now 2 My pain is sharp, burning, dull, stabbing and aching  In the last 24 hours, has pain interfered with the following? General activity 2 Relation with others 2 Enjoyment of life 2 What TIME of day is your pain at its worst? constant  Sleep (in general) Good  Pain is worse with: bending and some activites Pain improves with: rest, heat/ice and medication Relief from Meds: 9  Mobility walk without assistance how many minutes can you walk? a lot do you drive?  yes Do you have any goals in this area?  no  Function disabled: date disabled . I need assistance with the following:  household duties and shopping Do you have any goals in this area?  no  Neuro/Psych dizziness depression anxiety loss of taste or smell  Prior Studies Any changes since last visit?  no  Physicians involved in your care Any changes since last visit?  no   Family History  Problem Relation Age of Onset  . Heart disease Father    History   Social History  . Marital Status: Legally Separated    Spouse Name: N/A    Number of Children: N/A  . Years of Education: N/A   Social History Main Topics  . Smoking status: Current Every Day Smoker -- 0.50 packs/day for 30 years    Types: Cigarettes    Last  Attempt to Quit: 03/14/2011  . Smokeless tobacco: Never Used  . Alcohol Use: No  . Drug Use: No  . Sexual Activity: No   Other Topics Concern  . None   Social History Narrative  . None   Past Surgical History  Procedure Laterality Date  . Brain surgery      x3  . Appendectomy    . Joint replacement      thumbs and knees replacement  . Tubal ligation    . Back surgery      x2 back surgeries  . Eye surgery Right 05/24/12  . Bi-ventricular implantable cardioverter defibrillator  (crt-d)  2009; 05/2013    STJ CRTD generator change by Dr Caryl Comes 05/2013   Past Medical History  Diagnosis Date  . SOB (shortness of breath)   . CAD (coronary artery disease)     Prior LAD stenting with stent thrombosis occurring in a withdrawal phase of her Plavix undertaken because of trigeminal neuralgia and release surgery. It was treated medically.  . Anemia   . CHF (congestive heart failure)   . GERD (gastroesophageal reflux disease)   . Hyperlipidemia   . Osteoarthritis   . Ischemic cardiomyopathy   . Complication of anesthesia     massive heart attack  . Cancer  small bladder cancer removed no  treatment  . Cataract    BP 101/61  Pulse 80  Resp 14  Ht 5' 2.5" (1.588 m)  Wt 148 lb (67.132 kg)  BMI 26.62 kg/m2  SpO2 95%  Opioid Risk Score:   Fall Risk Score: High Fall Risk (>13 points) (patient educated handout given)   Review of Systems  Musculoskeletal: Positive for arthralgias and myalgias.  Neurological: Positive for dizziness.  Psychiatric/Behavioral: Positive for dysphoric mood. The patient is nervous/anxious.   All other systems reviewed and are negative.       Objective:   Physical Exam  Constitutional: She is oriented to person, place, and time. She appears well-developed and well-nourished.  More alert today.  HENT:  Head: Normocephalic and atraumatic.  Eyes: Conjunctivae and EOM are normal. Pupils are equal, round, and reactive to light.  Neck: Normal range  of motion. Neck supple.  Cardiovascular: Normal rate and regular rhythm.  Pulmonary/Chest: Effort normal. No respiratory distress.  Abdominal: She exhibits no distension.  Neurological: Balance better. Continued facial hypersensitivity on the right. Strength near 5/5. Sensation grossly in tact in the limbs except for below the knees where her fine touch is decreased.  Musculoskeletal:  Lumbar spine tender with hyperextension and side bending, especially on the right. She had difficulty moving to neutral from a flexed position.  Hands and toes are generally tender to touch and show chronic arthritic signs/ sclerosis.  Psych: . Mood is very pleasant. .    Assessment & Plan:   ASSESSMENT:  1. Right-sided trigeminal neuralgia.  2. Lumbar postlaminectomy syndrome.  3. Osteoarthritis of the knees.  4. Cardiomyopathy.  5. Weight loss  6. Peripheral neuropathy, risk factor for falls.   PLAN:  1.  percocet 10/325 for breakthrough pain. Will stay away from a long acting narcotic given the safey factors involved. .  2. Trileptal---continue 300mg  BID in addition to the gabapentin.  3. Rx for voltaren gel was written to help her hand and knee pain. Use TID  4.  Reviewed lumbar ROM and pilates principles at length. Discussed safe guarding the house given her increased risk for falling which will not decrease moving forward.  5. My PA will see her back in about a month. I can see her back in 3-4 months  .

## 2013-05-26 ENCOUNTER — Encounter: Payer: Medicare Other | Admitting: Internal Medicine

## 2013-05-27 ENCOUNTER — Other Ambulatory Visit: Payer: Self-pay

## 2013-05-27 MED ORDER — SIMVASTATIN 10 MG PO TABS: 10.0000 mg | ORAL_TABLET | Freq: Every day | ORAL | Status: DC

## 2013-06-02 ENCOUNTER — Other Ambulatory Visit: Payer: Self-pay

## 2013-06-02 MED ORDER — FUROSEMIDE 80 MG PO TABS: ORAL_TABLET | ORAL | Status: DC

## 2013-06-09 ENCOUNTER — Encounter: Payer: Self-pay | Admitting: Internal Medicine

## 2013-06-13 ENCOUNTER — Other Ambulatory Visit: Payer: Self-pay | Admitting: *Deleted

## 2013-06-13 DIAGNOSIS — Z79899 Other long term (current) drug therapy: Secondary | ICD-10-CM

## 2013-06-13 DIAGNOSIS — M170 Bilateral primary osteoarthritis of knee: Secondary | ICD-10-CM

## 2013-06-13 DIAGNOSIS — M1710 Unilateral primary osteoarthritis, unspecified knee: Secondary | ICD-10-CM

## 2013-06-13 DIAGNOSIS — Z5181 Encounter for therapeutic drug level monitoring: Secondary | ICD-10-CM

## 2013-06-13 DIAGNOSIS — IMO0002 Reserved for concepts with insufficient information to code with codable children: Secondary | ICD-10-CM

## 2013-06-13 DIAGNOSIS — G500 Trigeminal neuralgia: Secondary | ICD-10-CM

## 2013-06-13 MED ORDER — OXYCODONE-ACETAMINOPHEN 10-325 MG PO TABS: 1.0000 | ORAL_TABLET | Freq: Four times a day (QID) | ORAL | Status: DC | PRN

## 2013-06-13 MED ORDER — TRAMADOL HCL 50 MG PO TABS: 50.0000 mg | ORAL_TABLET | Freq: Four times a day (QID) | ORAL | Status: DC | PRN

## 2013-06-13 NOTE — Telephone Encounter (Signed)
RX printed for MD to sign for RN visit 06/17/13 

## 2013-06-17 ENCOUNTER — Telehealth: Payer: Self-pay

## 2013-06-17 NOTE — Telephone Encounter (Signed)
Patient called to cancel appointment.  She has had to walk a lot and cannot come in.  She will be ok with her medications for the weekend.  She will call back to schedule appointment.

## 2013-06-28 ENCOUNTER — Encounter: Payer: Medicare Other | Attending: Physical Medicine & Rehabilitation | Admitting: *Deleted

## 2013-06-28 ENCOUNTER — Encounter: Payer: Self-pay | Admitting: *Deleted

## 2013-06-28 DIAGNOSIS — M961 Postlaminectomy syndrome, not elsewhere classified: Secondary | ICD-10-CM

## 2013-06-28 DIAGNOSIS — Z5181 Encounter for therapeutic drug level monitoring: Secondary | ICD-10-CM | POA: Insufficient documentation

## 2013-06-28 DIAGNOSIS — IMO0002 Reserved for concepts with insufficient information to code with codable children: Secondary | ICD-10-CM | POA: Insufficient documentation

## 2013-06-28 DIAGNOSIS — M1710 Unilateral primary osteoarthritis, unspecified knee: Secondary | ICD-10-CM | POA: Insufficient documentation

## 2013-06-28 DIAGNOSIS — Z79899 Other long term (current) drug therapy: Secondary | ICD-10-CM | POA: Insufficient documentation

## 2013-06-28 DIAGNOSIS — G500 Trigeminal neuralgia: Secondary | ICD-10-CM | POA: Insufficient documentation

## 2013-06-28 NOTE — Progress Notes (Signed)
Here for pill count and medication refills. Percocet 10/325 # 58  Fill date 05/30/13 Today NV# 10 VSS  She has had surgery on her right eye and was told she might loose her eye.  She was carrying her cat and news paper in one morning and the cat decided to jump out of her arms and she fell hitting her eye on a "conk" shell.  She is wearing dark glasses today and has taken public transportation. She is impressed that [facility] came and picked her up at her house and dropped her off here for free, when she was paying neighbors and friends around $30 to transport her to all of her appointments. Her pill count is appropriate and I have given her refills on her percocet for the month and tramadol.  She has a pocket of fluid on her left elbow and was asking what to do about it.  She bumped it a few weeks ago.  I suggested she ice it and apply compression bandage and see if that helps.  At some point if fluid is not reabsorbed she may have to have it drained.  I spoke with Dr Naaman Plummer and he agreed.  Return in one month for med refill and two months with Dr Naaman Plummer

## 2013-06-28 NOTE — Patient Instructions (Signed)
Follow up one month with RN med refill and 2 months with [name]

## 2013-07-05 ENCOUNTER — Encounter: Payer: Self-pay | Admitting: Internal Medicine

## 2013-07-25 ENCOUNTER — Encounter: Payer: Medicare Other | Admitting: Registered Nurse

## 2013-07-27 ENCOUNTER — Encounter: Payer: Medicare Other | Attending: Physical Medicine & Rehabilitation | Admitting: Registered Nurse

## 2013-07-27 ENCOUNTER — Encounter: Payer: Self-pay | Admitting: Registered Nurse

## 2013-07-27 VITALS — BP 86/59 | HR 68 | Resp 14 | Ht 63.0 in | Wt 151.0 lb

## 2013-07-27 DIAGNOSIS — G500 Trigeminal neuralgia: Secondary | ICD-10-CM

## 2013-07-27 DIAGNOSIS — Z5181 Encounter for therapeutic drug level monitoring: Secondary | ICD-10-CM

## 2013-07-27 DIAGNOSIS — M170 Bilateral primary osteoarthritis of knee: Secondary | ICD-10-CM

## 2013-07-27 DIAGNOSIS — M961 Postlaminectomy syndrome, not elsewhere classified: Secondary | ICD-10-CM

## 2013-07-27 DIAGNOSIS — M1710 Unilateral primary osteoarthritis, unspecified knee: Secondary | ICD-10-CM

## 2013-07-27 DIAGNOSIS — Z79899 Other long term (current) drug therapy: Secondary | ICD-10-CM

## 2013-07-27 DIAGNOSIS — IMO0002 Reserved for concepts with insufficient information to code with codable children: Secondary | ICD-10-CM

## 2013-07-27 MED ORDER — OXYCODONE-ACETAMINOPHEN 10-325 MG PO TABS: 1.0000 | ORAL_TABLET | Freq: Four times a day (QID) | ORAL | Status: DC | PRN

## 2013-07-27 NOTE — Progress Notes (Signed)
Subjective:    Patient ID: [name], female    DOB: 04-07-1945, 68 y.o.   MRN: [id number]  HPI: [name] is a 68 year old female who returns for follow up for chronic pain and medication refill. She says her pain is located right face numbness. She rates her pain 1. At times she has pain in bilateral knees, no pain at this time. She admits to suicidal thoughts no plan to harm herself. She is seeing a psychiatrist and has been seeing Dr. Casimiro Needle for many years.. Her current exercise regime is walking. She hasn't had any falls in the last month. She had a fall over a month ago in her bathroom, she tripped over the rug. She has removed the bathroom rug and no falls since. She was educated on falls prevention and verbalized understanding.  Pain Inventory Average Pain 2 Pain Right Now 1 My pain is sharp, stabbing and aching  In the last 24 hours, has pain interfered with the following? General activity 2 Relation with others 2 Enjoyment of life 4 What TIME of day is your pain at its worst? varies Sleep (in general) Good  Pain is worse with: bending, some activites and chewing Pain improves with: rest and medication Relief from Meds: 6  Mobility walk without assistance ability to climb steps?  yes do you drive?  yes  Function retired I need assistance with the following:  household duties  Neuro/Psych weakness dizziness depression anxiety loss of taste or smell suicidal thoughts  Does not have a plan or intention. These are just thoughts. Has a psychiatrist  Prior Studies Any changes since last visit?  no  Physicians involved in your care Any changes since last visit?  no   Family History  Problem Relation Age of Onset  . Heart disease Father    History   Social History  . Marital Status: Legally Separated    Spouse Name: N/A    Number of Children: N/A  . Years of Education: N/A   Social History Main Topics  . Smoking status: Current Every  Day Smoker -- 0.50 packs/day for 30 years    Types: Cigarettes    Last Attempt to Quit: 03/14/2011  . Smokeless tobacco: Never Used  . Alcohol Use: No  . Drug Use: No  . Sexual Activity: No   Other Topics Concern  . None   Social History Narrative  . None   Past Surgical History  Procedure Laterality Date  . Brain surgery      x3  . Appendectomy    . Joint replacement      thumbs and knees replacement  . Tubal ligation    . Back surgery      x2 back surgeries  . Eye surgery Right 05/24/12  . Bi-ventricular implantable cardioverter defibrillator  (crt-d)  2009; 05/2013    STJ CRTD generator change by Dr Caryl Comes 05/2013   Past Medical History  Diagnosis Date  . SOB (shortness of breath)   . CAD (coronary artery disease)     Prior LAD stenting with stent thrombosis occurring in a withdrawal phase of her Plavix undertaken because of trigeminal neuralgia and release surgery. It was treated medically.  . Anemia   . CHF (congestive heart failure)   . GERD (gastroesophageal reflux disease)   . Hyperlipidemia   . Osteoarthritis   . Ischemic cardiomyopathy   . Complication of anesthesia     massive heart attack  . Cancer  small bladder cancer removed no  treatment  . Cataract    BP 86/59  Pulse 68  Resp 14  Ht 5\' 3"  (1.6 m)  Wt 151 lb (68.493 kg)  BMI 26.76 kg/m2  SpO2 97%  Opioid Risk Score:   Fall Risk Score: Moderate Fall Risk (6-13 points) (educated and handout given on fall prevention  in the home at previous visit)  Review of Systems  Neurological: Positive for dizziness and weakness.  Psychiatric/Behavioral: Positive for suicidal ideas and dysphoric mood. The patient is nervous/anxious.        Does not have a plan or intention  All other systems reviewed and are negative.      Objective:   Physical Exam  Nursing note and vitals reviewed. Constitutional: She is oriented to person, place, and time. She appears well-developed and well-nourished.  HENT:    Head: Normocephalic.  Right Facial: Tenderness  Neck: Normal range of motion. Neck supple.  Cardiovascular: Normal rate and regular rhythm.   Pulmonary/Chest: Effort normal and breath sounds normal.  Musculoskeletal: Normal range of motion.  Normal Muscle Bulk: Muscle testing reveals Upper and Lower extremities Muscle Strength 5/5/ Full ROM Noo Back or paraspinal tenderness noted.  Neurological: She is alert and oriented to person, place, and time.  Skin: Skin is warm and dry.  Psychiatric: She has a normal mood and affect.          Assessment & Plan:   1. Right sided trigeminal neuralgia: Refilled: oxyCODONE 10/325mg  one tablet every 6 hors as needed. #90  2. Lumbar postlaminectomy syndrome.: Stable at this time.  3. Osteoarthritis of the knees: Continue with Voltaren gel  20 minutes of face toface patient care time was spent during this visit. All questions were encouraged and answered.  F/U in 1 month

## 2013-08-03 ENCOUNTER — Other Ambulatory Visit: Payer: Self-pay | Admitting: *Deleted

## 2013-08-03 MED ORDER — RAMIPRIL 2.5 MG PO CAPS: 2.5000 mg | ORAL_CAPSULE | Freq: Every day | ORAL | Status: DC

## 2013-08-08 ENCOUNTER — Other Ambulatory Visit: Payer: Medicare Other

## 2013-08-08 ENCOUNTER — Other Ambulatory Visit (INDEPENDENT_AMBULATORY_CARE_PROVIDER_SITE_OTHER): Payer: Medicare Other

## 2013-08-08 DIAGNOSIS — E890 Postprocedural hypothyroidism: Secondary | ICD-10-CM

## 2013-08-08 LAB — TSH: TSH: 0.19 u[IU]/mL — AB (ref 0.35–4.50)

## 2013-08-08 LAB — T4, FREE: Free T4: 0.91 ng/dL (ref 0.60–1.60)

## 2013-08-09 ENCOUNTER — Other Ambulatory Visit: Payer: Self-pay | Admitting: Physical Medicine & Rehabilitation

## 2013-08-10 ENCOUNTER — Other Ambulatory Visit: Payer: Self-pay

## 2013-08-10 MED ORDER — RAMIPRIL 2.5 MG PO CAPS: 2.5000 mg | ORAL_CAPSULE | Freq: Every day | ORAL | Status: DC

## 2013-08-11 ENCOUNTER — Ambulatory Visit: Payer: Medicare Other | Admitting: Endocrinology

## 2013-08-11 NOTE — Progress Notes (Signed)
Quick Note:  Please let patient know that the lab result is abnormal and f/u appt needed ______

## 2013-08-12 ENCOUNTER — Other Ambulatory Visit: Payer: Self-pay

## 2013-08-12 MED ORDER — OXCARBAZEPINE 300 MG PO TABS: ORAL_TABLET | ORAL | Status: DC

## 2013-08-25 ENCOUNTER — Encounter: Payer: Medicare Other | Admitting: Internal Medicine

## 2013-08-25 ENCOUNTER — Encounter: Payer: Medicare Other | Attending: Physical Medicine & Rehabilitation | Admitting: Registered Nurse

## 2013-08-25 ENCOUNTER — Encounter: Payer: Self-pay | Admitting: Registered Nurse

## 2013-08-25 VITALS — BP 117/65 | HR 83 | Resp 14 | Wt 150.0 lb

## 2013-08-25 DIAGNOSIS — Z79899 Other long term (current) drug therapy: Secondary | ICD-10-CM | POA: Insufficient documentation

## 2013-08-25 DIAGNOSIS — M1710 Unilateral primary osteoarthritis, unspecified knee: Secondary | ICD-10-CM | POA: Insufficient documentation

## 2013-08-25 DIAGNOSIS — IMO0002 Reserved for concepts with insufficient information to code with codable children: Secondary | ICD-10-CM

## 2013-08-25 DIAGNOSIS — M170 Bilateral primary osteoarthritis of knee: Secondary | ICD-10-CM

## 2013-08-25 DIAGNOSIS — G500 Trigeminal neuralgia: Secondary | ICD-10-CM | POA: Insufficient documentation

## 2013-08-25 DIAGNOSIS — M961 Postlaminectomy syndrome, not elsewhere classified: Secondary | ICD-10-CM | POA: Insufficient documentation

## 2013-08-25 DIAGNOSIS — Z5181 Encounter for therapeutic drug level monitoring: Secondary | ICD-10-CM

## 2013-08-25 MED ORDER — OXYCODONE-ACETAMINOPHEN 10-325 MG PO TABS: 1.0000 | ORAL_TABLET | Freq: Three times a day (TID) | ORAL | Status: DC | PRN

## 2013-08-25 NOTE — Progress Notes (Signed)
Subjective:    Patient ID: [name], female    DOB: 28-Mar-1946, 68 y.o.   MRN: [id number]  HPI: [name] is a 68 year old female who returns for follow up for chronic pain and medication refill. She says her pain is located in her bilateral lower extremities. Left greater than right. Her pain has increased in intensity and she is becoming more sedentary. Her current exercise regime is walking.    Pain Inventory Average Pain 8 Pain Right Now 8 My pain is constant, sharp, stabbing, tingling and aching  In the last 24 hours, has pain interfered with the following? General activity 8 Relation with others 9 Enjoyment of life 10 What TIME of day is your pain at its worst? morning and daytime Sleep (in general) Good  Pain is worse with: walking and standing Pain improves with: heat/ice and medication Relief from Meds: 5  Mobility walk without assistance  Function retired I need assistance with the following:  household duties and shopping  Neuro/Psych depression anxiety  Prior Studies Any changes since last visit?  no  Physicians involved in your care Any changes since last visit?  no   Family History  Problem Relation Age of Onset  . Heart disease Father    History   Social History  . Marital Status: Legally Separated    Spouse Name: N/A    Number of Children: N/A  . Years of Education: N/A   Social History Main Topics  . Smoking status: Current Every Day Smoker -- 0.50 packs/day for 30 years    Types: Cigarettes    Last Attempt to Quit: 03/14/2011  . Smokeless tobacco: Never Used  . Alcohol Use: No  . Drug Use: No  . Sexual Activity: No   Other Topics Concern  . None   Social History Narrative  . None   Past Surgical History  Procedure Laterality Date  . Brain surgery      x3  . Appendectomy    . Joint replacement      thumbs and knees replacement  . Tubal ligation    . Back surgery      x2 back surgeries  . Eye surgery  Right 05/24/12  . Bi-ventricular implantable cardioverter defibrillator  (crt-d)  2009; 05/2013    STJ CRTD generator change by Dr Caryl Comes 05/2013   Past Medical History  Diagnosis Date  . SOB (shortness of breath)   . CAD (coronary artery disease)     Prior LAD stenting with stent thrombosis occurring in a withdrawal phase of her Plavix undertaken because of trigeminal neuralgia and release surgery. It was treated medically.  . Anemia   . CHF (congestive heart failure)   . GERD (gastroesophageal reflux disease)   . Hyperlipidemia   . Osteoarthritis   . Ischemic cardiomyopathy   . Complication of anesthesia     massive heart attack  . Cancer     small bladder cancer removed no  treatment  . Cataract    BP 117/65  Pulse 83  Resp 14  Wt 150 lb (68.04 kg)  SpO2 98%  Opioid Risk Score:   Fall Risk Score: High Fall Risk (>13 points) (high risk due to depth perception altered by use of only one eye.  Educated and handout given for fall prevention in the home at prior visit)  Review of Systems  Constitutional:       Loss of taste/smell  Musculoskeletal:  Leg pain  Psychiatric/Behavioral: Positive for dysphoric mood. The patient is nervous/anxious.   All other systems reviewed and are negative.      Objective:   Physical Exam  Nursing note and vitals reviewed. Constitutional: She is oriented to person, place, and time. She appears well-developed and well-nourished.  HENT:  Head: Normocephalic and atraumatic.  Neck: Normal range of motion. Neck supple.  Cardiovascular: Normal rate and regular rhythm.   Pulmonary/Chest: Effort normal and breath sounds normal.  Musculoskeletal:  Normal Muscle Bulk: Muscle testing Reveals: Upper Extremities: Full ROM and Muscle strength 5/5. Lower extremities: Full ROM and Muscle strength 5/5 Arises from Chair with ease Narrow Based gait   Neurological: She is alert and oriented to person, place, and time.  Skin: Skin is warm and dry.    Psychiatric: She has a normal mood and affect.          Assessment & Plan:  1. Right sided trigeminal neuralgia: Refilled: oxyCODONE 10/325mg  one tablet every 8 hours as needed.Increased to 100 tablets. May take an extra tablet when she is in severe pain No more than 4 tablets a day.  2. Lumbar postlaminectomy syndrome.: Stable at this time.  3. Osteoarthritis of the knees: Continue with Voltaren gel  20 minutes of face to face patient care time was spent during this visit. All questions were encouraged and answered.  F/U in 1 month

## 2013-08-30 ENCOUNTER — Ambulatory Visit: Payer: Medicare Other | Admitting: Physical Medicine & Rehabilitation

## 2013-09-02 ENCOUNTER — Other Ambulatory Visit: Payer: Self-pay | Admitting: Physical Medicine & Rehabilitation

## 2013-09-02 NOTE — Progress Notes (Signed)
Urine drug screen 08/25/13 was consistent

## 2013-09-13 ENCOUNTER — Other Ambulatory Visit: Payer: Self-pay | Admitting: Cardiology

## 2013-09-13 ENCOUNTER — Telehealth: Payer: Self-pay | Admitting: Cardiology

## 2013-09-13 NOTE — Telephone Encounter (Signed)
Pt calling to confirm her current med list on file.  Went over pts current med list thoroughly with her, so she could endorse this with her Nurse Tourist information centre manager with [employer].  Pt very grateful for all the assistance provided.  Told pt she can call anytime and we will be glad to help her.  Also updated pt on next OV with Dr Marlou Porch for 7/15.  Pt confirmed.

## 2013-09-13 NOTE — Telephone Encounter (Signed)
New message    Should pt still be taking klor-con 20mg ?  She has been off of it since April.  She last had it filled in march and did not refill it. The [employer] case manager nurse called her and discovered this.  The nurse goes over her medications twice a year.cv

## 2013-09-19 ENCOUNTER — Ambulatory Visit: Payer: Medicare Other | Admitting: Endocrinology

## 2013-09-20 ENCOUNTER — Encounter: Payer: Self-pay | Admitting: Registered Nurse

## 2013-09-20 ENCOUNTER — Encounter: Payer: Medicare Other | Attending: Physical Medicine & Rehabilitation | Admitting: Registered Nurse

## 2013-09-20 VITALS — BP 102/59 | HR 86 | Resp 14 | Ht 63.0 in | Wt 149.0 lb

## 2013-09-20 DIAGNOSIS — G500 Trigeminal neuralgia: Secondary | ICD-10-CM

## 2013-09-20 DIAGNOSIS — M961 Postlaminectomy syndrome, not elsewhere classified: Secondary | ICD-10-CM

## 2013-09-20 DIAGNOSIS — M170 Bilateral primary osteoarthritis of knee: Secondary | ICD-10-CM

## 2013-09-20 DIAGNOSIS — Z79899 Other long term (current) drug therapy: Secondary | ICD-10-CM | POA: Insufficient documentation

## 2013-09-20 DIAGNOSIS — Z5181 Encounter for therapeutic drug level monitoring: Secondary | ICD-10-CM

## 2013-09-20 DIAGNOSIS — M1710 Unilateral primary osteoarthritis, unspecified knee: Secondary | ICD-10-CM

## 2013-09-20 DIAGNOSIS — IMO0002 Reserved for concepts with insufficient information to code with codable children: Secondary | ICD-10-CM | POA: Insufficient documentation

## 2013-09-20 MED ORDER — OXYCODONE-ACETAMINOPHEN 10-325 MG PO TABS: 1.0000 | ORAL_TABLET | Freq: Three times a day (TID) | ORAL | Status: DC | PRN

## 2013-09-20 NOTE — Progress Notes (Signed)
Subjective:    Patient ID: [name], female    DOB: 19-Sep-1945, 68 y.o.   MRN: [id number]  HPI: [name] is a 68 year old female who returns for follow up for chronic pain and medication refill. She says her pain is located in her bilateral legs,left greater than right. She says the right side of her face only hurts if she drinks something cold or when the wind blows. Occassionally her right eyes is bothersome. She does not follow and exercise routine. She has been encouraged to start. Demonstrated chair exercises and she verbalizes understanding. She says she has increase intensity of pain in her  legs was looking for a change in medication. I let her know we just increased her medication tablets last month. I also let her know I will speak to Dr. Naaman Plummer. She verbalizes understanding.   She says she seen her Orthopedidst Dr. Dan Europe and hex-ray her left foot due to pain. She says he told her she had DJD.  Pain Inventory Average Pain 6 Pain Right Now 6 My pain is intermittent, sharp and aching  In the last 24 hours, has pain interfered with the following? General activity 6 Relation with others 6 Enjoyment of life 6 What TIME of day is your pain at its worst? night Sleep (in general) Good  Pain is worse with: walking, bending and standing Pain improves with: rest, heat/ice and medication Relief from Meds: 7  Mobility walk without assistance walk with assistance use a cane how many minutes can you walk? 10 ability to climb steps?  yes do you drive?  no Do you have any goals in this area?  no  Function retired I need assistance with the following:  household duties Do you have any goals in this area?  no  Neuro/Psych weakness depression anxiety loss of taste or smell  Prior Studies Any changes since last visit?  no  Physicians involved in your care Any changes since last visit?  no   Family History  Problem Relation Age of Onset  . Heart disease  Father    History   Social History  . Marital Status: Legally Separated    Spouse Name: N/A    Number of Children: N/A  . Years of Education: N/A   Social History Main Topics  . Smoking status: Current Every Day Smoker -- 0.50 packs/day for 30 years    Types: Cigarettes    Last Attempt to Quit: 03/14/2011  . Smokeless tobacco: Never Used  . Alcohol Use: No  . Drug Use: No  . Sexual Activity: No   Other Topics Concern  . None   Social History Narrative  . None   Past Surgical History  Procedure Laterality Date  . Brain surgery      x3  . Appendectomy    . Joint replacement      thumbs and knees replacement  . Tubal ligation    . Back surgery      x2 back surgeries  . Eye surgery Right 05/24/12  . Bi-ventricular implantable cardioverter defibrillator  (crt-d)  2009; 05/2013    STJ CRTD generator change by Dr Caryl Comes 05/2013   Past Medical History  Diagnosis Date  . SOB (shortness of breath)   . CAD (coronary artery disease)     Prior LAD stenting with stent thrombosis occurring in a withdrawal phase of her Plavix undertaken because of trigeminal neuralgia and release surgery. It was treated medically.  . Anemia   .  CHF (congestive heart failure)   . GERD (gastroesophageal reflux disease)   . Hyperlipidemia   . Osteoarthritis   . Ischemic cardiomyopathy   . Complication of anesthesia     massive heart attack  . Cancer     small bladder cancer removed no  treatment  . Cataract    BP 102/59  Pulse 86  Resp 14  Ht 5\' 3"  (1.6 m)  Wt 149 lb (67.586 kg)  BMI 26.40 kg/m2  SpO2 96%  Opioid Risk Score:   Fall Risk Score: High Fall Risk (>13 points) (pt educated on fall risk, brochure given to pt previously )   Review of Systems  HENT:       Loss of smell or taste  Neurological: Positive for weakness.  Psychiatric/Behavioral: The patient is nervous/anxious.        Depression  All other systems reviewed and are negative.      Objective:   Physical Exam    Nursing note and vitals reviewed. Constitutional: She is oriented to person, place, and time. She appears well-developed and well-nourished.  HENT:  Head: Normocephalic and atraumatic.  Neck: Normal range of motion. Neck supple.  Cardiovascular: Normal rate, regular rhythm and normal heart sounds.   Pulmonary/Chest: Effort normal and breath sounds normal.  Musculoskeletal:  Normal Muscle Bulk and Muscle testing Reveals: Upper Extremities: Full ROM and Muscle Strength 5/5 Back without spinal or paraspinal tenderness Lower Extremities: Right Leg with Full ROM and Muscle Strength 5/5 Left Leg: Left Leg Flexion Produces Pain into Dorsal portion of foot. wearing a soft shoe on left foot from [place] from chair with ease Wide Based gait   Neurological: She is alert and oriented to person, place, and time.  Skin: Skin is warm and dry.  Psychiatric: She has a normal mood and affect.          Assessment & Plan:  1. Right sided trigeminal neuralgia: Refilled: oxyCODONE 10/325mg  one tablet every 8 hours as needed # 100 tablets. May take an extra tablet when she is in severe pain No more than 4 tablets a day.  2. Lumbar postlaminectomy syndrome.: Stable at this time.  3. Osteoarthritis of the knees: Continue with Voltaren gel   20 minutes of face to face patient care time was spent during this visit. All questions were encouraged and answered.   F/U in 1 month

## 2013-10-03 ENCOUNTER — Other Ambulatory Visit: Payer: Self-pay | Admitting: Endocrinology

## 2013-10-07 ENCOUNTER — Ambulatory Visit (INDEPENDENT_AMBULATORY_CARE_PROVIDER_SITE_OTHER): Payer: Medicare Other | Admitting: Endocrinology

## 2013-10-07 ENCOUNTER — Encounter: Payer: Self-pay | Admitting: Endocrinology

## 2013-10-07 VITALS — BP 81/58 | HR 78 | Temp 97.8°F | Resp 12 | Ht 63.0 in | Wt 145.4 lb

## 2013-10-07 DIAGNOSIS — E890 Postprocedural hypothyroidism: Secondary | ICD-10-CM

## 2013-10-07 MED ORDER — LEVOTHYROXINE SODIUM 112 MCG PO TABS: 112.0000 ug | ORAL_TABLET | Freq: Every day | ORAL | Status: DC

## 2013-10-07 NOTE — Patient Instructions (Signed)
Take new dose of thyroid

## 2013-10-07 NOTE — Progress Notes (Addendum)
Patient ID: [name], female   DOB: [birth year], 68 y.o.   MRN: [id number]   Reason for Appointment:  Post ablative hypothyroidism, followup  History of Present Illness:    She became hypothyroid in 03/2012 following radioactive iodine treatment for hyperthyroidism in 11/13  She was initially started with 75 mcg of levothyroxine but with her TSH being nearly 50 in 3/14 the dose was increased to 137 mcg With this her TSH was normal in 1/14 Subsequently has required relatively lower dose and since 8/14 has been on 125 mcg Difficulty in assessing her symptoms since she tends to have chronic  fatigue from her multiple medical problems She is complaining of getting to sleep more recently Does not complain of heat or cold intolerance, nervousness or palpitations She has lost a little weight since about 6 weeks ago She is quite compliant with taking her medication daily but takes it after breakfast   Wt Readings from Last 3 Encounters:  10/07/13 145 lb 6.4 oz (65.953 kg)  09/20/13 149 lb (67.586 kg)  08/25/13 150 lb (68.04 kg)   Labs:  Lab Results  Component Value Date   FREET4 0.91 08/08/2013   FREET4 0.64 02/08/2013   FREET4 1.20 10/26/2012   TSH 0.19* 08/08/2013   TSH 2.29 02/08/2013   TSH 0.10* 10/26/2012        Medication List       This list is accurate as of: 10/07/13  2:35 PM.  Always use your most recent med list.               artificial tears ointment  Place into the right eye as needed.     aspirin 325 MG EC tablet  Take 325 mg by mouth daily.     bacitracin ophthalmic ointment     carvedilol 6.25 MG tablet  Commonly known as:  COREG  Take 6.25 mg by mouth 2 (two) times daily with a meal. 1 tab twice a day     clorazepate 3.75 MG tablet  Commonly known as:  TRANXENE  Take 1 tablet by mouth 2 (two) times daily as needed.     diclofenac sodium 1 % Gel  Commonly known as:  VOLTAREN  Apply 1 application topically 3 (three) times daily. Apply to knees  and hands     furosemide 80 MG tablet  Commonly known as:  LASIX  2 tabs in am and one in pm     gabapentin 800 MG tablet  Commonly known as:  NEURONTIN  Take 800 mg by mouth 4 (four) times daily as needed. For pain     iron polysaccharides 150 MG capsule  Commonly known as:  NIFEREX  Take 150 mg by mouth 2 (two) times daily.     levothyroxine 125 MCG tablet  Commonly known as:  SYNTHROID, LEVOTHROID  TAKE 1 TABLET (125 MCG TOTAL) BY MOUTH DAILY.     NITROSTAT 0.4 MG SL tablet  Generic drug:  nitroGLYCERIN  TAKE 1 TABLET AS DIRECTED SUBLINGUALLY AS DIRECTED FOR CHEST PAIN     Oxcarbazepine 300 MG tablet  Commonly known as:  TRILEPTAL  TAKE 1 TABLET (300 MG TOTAL) BY MOUTH 2 (TWO) TIMES DAILY.     oxyCODONE-acetaminophen 10-325 MG per tablet  Commonly known as:  PERCOCET  Take 1 tablet by mouth every 8 (eight) hours as needed for pain. May take an extra tablet when the pain is severe. No more than 4 tablets a day     potassium  chloride SA 20 MEQ tablet  Commonly known as:  KLOR-CON M20  Take 1 tablet (20 mEq total) by mouth 2 (two) times daily.     prednisoLONE acetate 1 % ophthalmic suspension  Commonly known as:  PRED FORTE     promethazine 25 MG tablet  Commonly known as:  PHENERGAN  Take 1 tablet (25 mg total) by mouth every 6 (six) hours as needed. for nasuea     ramipril 2.5 MG capsule  Commonly known as:  ALTACE  Take 1 capsule (2.5 mg total) by mouth daily. daily     REFRESH TEARS 0.5 % Soln  Generic drug:  carboxymethylcellulose  1 drop.     simvastatin 10 MG tablet  Commonly known as:  ZOCOR  Take 1 tablet (10 mg total) by mouth daily.     sodium chloride 5 % ophthalmic ointment  Commonly known as:  MURO 416  Place 1 application into the right eye.     sodium chloride 2 % ophthalmic solution  Commonly known as:  MURO 128  Place 1 drop into the right eye.     spironolactone 25 MG tablet  Commonly known as:  ALDACTONE  Take 1 tablet (25 mg total)  by mouth daily.     tobramycin-dexamethasone ophthalmic solution  Commonly known as:  TOBRADEX     traMADol 50 MG tablet  Commonly known as:  ULTRAM  Take 1 tablet (50 mg total) by mouth every 6 (six) hours as needed.     valACYclovir 500 MG tablet  Commonly known as:  VALTREX  Take 1 tablet by mouth as directed.     venlafaxine 75 MG tablet  Commonly known as:  EFFEXOR  Take 75 mg by mouth 2 (two) times daily.     VIGAMOX 0.5 % ophthalmic solution  Generic drug:  moxifloxacin     zolpidem 10 MG tablet  Commonly known as:  AMBIEN  Take 10 mg by mouth at bedtime as needed. For sleep            Past Medical History  Diagnosis Date  . SOB (shortness of breath)   . CAD (coronary artery disease)     Prior LAD stenting with stent thrombosis occurring in a withdrawal phase of her Plavix undertaken because of trigeminal neuralgia and release surgery. It was treated medically.  . Anemia   . CHF (congestive heart failure)   . GERD (gastroesophageal reflux disease)   . Hyperlipidemia   . Osteoarthritis   . Ischemic cardiomyopathy   . Complication of anesthesia     massive heart attack  . Cancer     small bladder cancer removed no  treatment  . Cataract     Past Surgical History  Procedure Laterality Date  . Brain surgery      x3  . Appendectomy    . Joint replacement      thumbs and knees replacement  . Tubal ligation    . Back surgery      x2 back surgeries  . Eye surgery Right 05/24/12  . Bi-ventricular implantable cardioverter defibrillator  (crt-d)  2009; 05/2013    STJ CRTD generator change by Dr Caryl Comes 05/2013    Family History  Problem Relation Age of Onset  . Heart disease Father     Social History:  reports that she has been smoking Cigarettes.  She has a 15 pack-year smoking history. She has never used smokeless tobacco. She reports that she does not drink alcohol or  use illicit drugs.  Allergies: No Known Allergies  Review of Systems:  CARDIOLOGY:  she has history of coronary artery disease with multiple events and interventions History of COPD She has had systolic congestive heart failure History of chronic GI bleed and anemia History of trigeminal neuralgia in the past   .       No  history of Diabetes.     She has had some hair loss         Examination:   BP 81/58  Pulse 78  Temp(Src) 97.8 F (36.6 C)  Resp 12  Ht 5\' 3"  (1.6 m)  Wt 145 lb 6.4 oz (65.953 kg)  BMI 25.76 kg/m2  SpO2 97%   General Appearance: pleasant, not anxious, averagely built        Eyes:  external appearance normal  Neck: The thyroid is not palpable        Neurological: REFLEXES: at biceps are normal             Assessments   Post ablative hypothyroidism:  Although her TSH had been fairly consistent it is now trending lower without increase in free T4 She is asymptomatic and has not  have any goiter on exam Has been consistent with taking her medication daily We will reduce her dose to 112 mcg  She will need followup in 3 months   Hung Rhinesmith 10/07/2013, 2:35 PM

## 2013-10-12 ENCOUNTER — Ambulatory Visit (INDEPENDENT_AMBULATORY_CARE_PROVIDER_SITE_OTHER): Payer: Medicare Other | Admitting: Cardiology

## 2013-10-12 ENCOUNTER — Encounter: Payer: Self-pay | Admitting: Cardiology

## 2013-10-12 VITALS — BP 100/60 | HR 80 | Ht 64.0 in | Wt 146.0 lb

## 2013-10-12 DIAGNOSIS — Z95810 Presence of automatic (implantable) cardiac defibrillator: Secondary | ICD-10-CM

## 2013-10-12 DIAGNOSIS — Z79899 Other long term (current) drug therapy: Secondary | ICD-10-CM

## 2013-10-12 DIAGNOSIS — I10 Essential (primary) hypertension: Secondary | ICD-10-CM

## 2013-10-12 DIAGNOSIS — I5022 Chronic systolic (congestive) heart failure: Secondary | ICD-10-CM

## 2013-10-12 NOTE — Addendum Note (Signed)
Addended by: Avie Echevaria on: 10/12/2013 04:27 PM   Modules accepted: Orders

## 2013-10-12 NOTE — Patient Instructions (Signed)
Your physician recommends that you return for lab work today for [employer].  Your physician recommends that you continue on your current medications as directed. Please refer to the Current Medication list given to you today.  Your physician wants you to follow-up in: 6 months with [name]. You will receive a reminder letter in the mail two months in advance. If you don't receive a letter, please call our office to schedule the follow-up appointment.

## 2013-10-12 NOTE — Progress Notes (Signed)
[facility] [address], [address], [place]  [address] Phone: [phone] Fax:  [phone]  Date:  10/12/2013   ID:  [name], DOB 1945-04-11, MRN [id number]  PCP:  Shirline Frees, MD   History of Present Illness: [name] is a 68 y.o. female with ischemic cardiomyopathy ejection fraction in the 15-20% range, Ischemic cardiomyopathy status post Bi-V pacemaker, coronary artery disease with bare-metal stent implantation for in-stent restenosis here for followup. Dr. Caryl Comes who provided her with AV optimization of her device. She was told that her device at baseline was fairly synchronized.  She is battled with long-standing depression. Overall her breathing is improved.   In 2012, we had a long discussion about her cardiomyopathy and overall prognosis she has done remarkably well since then.    Wt Readings from Last 3 Encounters:  10/12/13 146 lb (66.225 kg)  10/07/13 145 lb 6.4 oz (65.953 kg)  09/20/13 149 lb (67.586 kg)     Past Medical History  Diagnosis Date  . SOB (shortness of breath)   . CAD (coronary artery disease)     Prior LAD stenting with stent thrombosis occurring in a withdrawal phase of her Plavix undertaken because of trigeminal neuralgia and release surgery. It was treated medically.  . Anemia   . CHF (congestive heart failure)   . GERD (gastroesophageal reflux disease)   . Hyperlipidemia   . Osteoarthritis   . Ischemic cardiomyopathy   . Complication of anesthesia     massive heart attack  . Cancer     small bladder cancer removed no  treatment  . Cataract     Past Surgical History  Procedure Laterality Date  . Brain surgery      x3  . Appendectomy    . Joint replacement      thumbs and knees replacement  . Tubal ligation    . Back surgery      x2 back surgeries  . Eye surgery Right 05/24/12  . Bi-ventricular implantable cardioverter defibrillator  (crt-d)  2009; 05/2013    STJ CRTD generator change by Dr Caryl Comes 05/2013     Current Outpatient Prescriptions  Medication Sig Dispense Refill  . Artificial Tear Ointment (ARTIFICIAL TEARS) ointment Place into the right eye as needed.      Marland Kitchen aspirin 325 MG EC tablet Take 325 mg by mouth daily.      . bacitracin ophthalmic ointment       . carboxymethylcellulose (REFRESH TEARS) 0.5 % SOLN 1 drop.      . carvedilol (COREG) 6.25 MG tablet Take 6.25 mg by mouth 2 (two) times daily with a meal. 1 tab twice a day      . clorazepate (TRANXENE) 3.75 MG tablet Take 1 tablet by mouth 2 (two) times daily as needed.       . diclofenac sodium (VOLTAREN) 1 % GEL Apply 1 application topically 3 (three) times daily. Apply to knees and hands  3 Tube  3  . furosemide (LASIX) 80 MG tablet 2 tabs in am and one in pm  90 tablet  6  . gabapentin (NEURONTIN) 800 MG tablet Take 800 mg by mouth 4 (four) times daily as needed. For pain      . iron polysaccharides (NIFEREX) 150 MG capsule Take 150 mg by mouth 2 (two) times daily.      Marland Kitchen levothyroxine (SYNTHROID, LEVOTHROID) 112 MCG tablet Take 1 tablet (112 mcg total) by mouth daily before breakfast.  30 tablet  3  . NITROSTAT 0.4 MG SL tablet TAKE 1 TABLET AS DIRECTED SUBLINGUALLY AS DIRECTED FOR CHEST PAIN  25 tablet  1  . Oxcarbazepine (TRILEPTAL) 300 MG tablet TAKE 1 TABLET (300 MG TOTAL) BY MOUTH 2 (TWO) TIMES DAILY.  60 tablet  3  . oxyCODONE-acetaminophen (PERCOCET) 10-325 MG per tablet Take 1 tablet by mouth every 8 (eight) hours as needed for pain. May take an extra tablet when the pain is severe. No more than 4 tablets a day  100 tablet  0  . potassium chloride SA (KLOR-CON M20) 20 MEQ tablet Take 1 tablet (20 mEq total) by mouth 2 (two) times daily.  60 tablet  4  . prednisoLONE acetate (PRED FORTE) 1 % ophthalmic suspension       . promethazine (PHENERGAN) 25 MG tablet Take 1 tablet (25 mg total) by mouth every 6 (six) hours as needed. for nasuea  90 tablet  1  . ramipril (ALTACE) 2.5 MG capsule Take 1 capsule (2.5 mg total) by  mouth daily. daily  90 capsule  0  . simvastatin (ZOCOR) 10 MG tablet Take 1 tablet (10 mg total) by mouth daily.  30 tablet  6  . sodium chloride (MURO 128) 2 % ophthalmic solution Place 1 drop into the right eye.      . sodium chloride (MURO 128) 5 % ophthalmic ointment Place 1 application into the right eye.      . spironolactone (ALDACTONE) 25 MG tablet Take 1 tablet (25 mg total) by mouth daily.  30 tablet  10  . tobramycin-dexamethasone (TOBRADEX) ophthalmic solution       . traMADol (ULTRAM) 50 MG tablet Take 1 tablet (50 mg total) by mouth every 6 (six) hours as needed.  60 tablet  3  . valACYclovir (VALTREX) 500 MG tablet Take 1 tablet by mouth as directed.      . venlafaxine (EFFEXOR) 75 MG tablet Take 75 mg by mouth 2 (two) times daily.       Marland Kitchen VIGAMOX 0.5 % ophthalmic solution       . zolpidem (AMBIEN) 10 MG tablet Take 10 mg by mouth at bedtime as needed. For sleep       No current facility-administered medications for this visit.    Allergies:   No Known Allergies  Social History:  The patient  reports that she has been smoking Cigarettes.  She has a 15 pack-year smoking history. She has never used smokeless tobacco. She reports that she does not drink alcohol or use illicit drugs.   ROS:  Please see the history of present illness.   Positive for depression, cardiomyopathy. No syncope, no fevers, no chest pain, no significant shortness of breath    PHYSICAL EXAM: VS:  BP 100/60  Pulse 80  Ht 5\' 4"  (1.626 m)  Wt 146 lb (66.225 kg)  BMI 25.05 kg/m2 Well nourished, well developed, in no acute distress HEENT: right eye sewn shut Neck: no JVD Cardiac:  normal S1, S2; RRR; no murmur Lungs:  clear to auscultation bilaterally, no wheezing, rhonchi or rales Abd: soft, nontender, no hepatomegaly Ext: trace edema Skin: warm and dry Neuro: no focal abnormalities noted  EKG:  Ventricular pacing, sinus rhythm, 65.     ASSESSMENT AND PLAN:  1. Chronic systolic heart  failure-currently well compensated. Ejection fraction of 20% range. No changes made in medications. NYHA class II symptoms. BP was low at one point in the 80s. Monitor. 2. Biventricular ICD-fairly well  synchronized according to review of Dr. Olin Pia note. 3. Hypertension-well-controlled. At times, slightly hypotensive. This may exacerbate dizziness. We have cut back her medications on previous visit. Continue as is. 4. We'll see back in 6 months. Check labs today  Signed, Candee Furbish, MD [facility]  10/12/2013 4:08 PM

## 2013-10-13 LAB — BASIC METABOLIC PANEL
BUN: 37 mg/dL — ABNORMAL HIGH (ref 6–23)
CALCIUM: 9.7 mg/dL (ref 8.4–10.5)
CO2: 30 mEq/L (ref 19–32)
Chloride: 101 mEq/L (ref 96–112)
Creatinine, Ser: 1.1 mg/dL (ref 0.4–1.2)
GFR: 53.96 mL/min — AB (ref >60.00)
GLUCOSE: 85 mg/dL (ref 70–99)
POTASSIUM: 3.6 meq/L (ref 3.5–5.1)
SODIUM: 137 meq/L (ref 135–145)

## 2013-10-18 ENCOUNTER — Telehealth: Payer: Self-pay | Admitting: Cardiology

## 2013-10-18 MED ORDER — FUROSEMIDE 80 MG PO TABS: 80.0000 mg | ORAL_TABLET | Freq: Two times a day (BID) | ORAL | Status: DC

## 2013-10-18 NOTE — Telephone Encounter (Signed)
Follow Up  Pt returned call from nurse. Please call

## 2013-10-20 ENCOUNTER — Other Ambulatory Visit: Payer: Self-pay

## 2013-10-20 DIAGNOSIS — Z5181 Encounter for therapeutic drug level monitoring: Secondary | ICD-10-CM

## 2013-10-20 DIAGNOSIS — M1710 Unilateral primary osteoarthritis, unspecified knee: Secondary | ICD-10-CM

## 2013-10-20 DIAGNOSIS — G500 Trigeminal neuralgia: Secondary | ICD-10-CM

## 2013-10-20 DIAGNOSIS — Z79899 Other long term (current) drug therapy: Secondary | ICD-10-CM

## 2013-10-20 DIAGNOSIS — M170 Bilateral primary osteoarthritis of knee: Secondary | ICD-10-CM

## 2013-10-20 DIAGNOSIS — IMO0002 Reserved for concepts with insufficient information to code with codable children: Secondary | ICD-10-CM

## 2013-10-20 NOTE — Telephone Encounter (Signed)
Patient called requesting a refill on her oxycodone.  She only has 2 pills left and has had a tough month of pain.  Please advise.

## 2013-10-20 NOTE — Telephone Encounter (Signed)
Left message for patient to call office to schedule appointment for 07/24 with Dr Naaman Plummer so she can get her medication refill.

## 2013-10-21 ENCOUNTER — Encounter: Payer: Self-pay | Admitting: Physical Medicine & Rehabilitation

## 2013-10-21 ENCOUNTER — Encounter: Payer: Medicare Other | Attending: Physical Medicine & Rehabilitation | Admitting: Physical Medicine & Rehabilitation

## 2013-10-21 VITALS — BP 111/65 | HR 85 | Resp 14 | Ht 64.0 in | Wt 152.0 lb

## 2013-10-21 DIAGNOSIS — M174 Other bilateral secondary osteoarthritis of knee: Secondary | ICD-10-CM

## 2013-10-21 DIAGNOSIS — Z5181 Encounter for therapeutic drug level monitoring: Secondary | ICD-10-CM | POA: Diagnosis present

## 2013-10-21 DIAGNOSIS — G500 Trigeminal neuralgia: Secondary | ICD-10-CM

## 2013-10-21 DIAGNOSIS — M175 Other unilateral secondary osteoarthritis of knee: Secondary | ICD-10-CM | POA: Diagnosis present

## 2013-10-21 DIAGNOSIS — M172 Bilateral post-traumatic osteoarthritis of knee: Secondary | ICD-10-CM

## 2013-10-21 MED ORDER — OXYCODONE-ACETAMINOPHEN 10-325 MG PO TABS: 1.0000 | ORAL_TABLET | Freq: Three times a day (TID) | ORAL | Status: DC | PRN

## 2013-10-21 MED ORDER — OXYCODONE HCL 15 MG PO TABS: 15.0000 mg | ORAL_TABLET | Freq: Four times a day (QID) | ORAL | Status: DC | PRN

## 2013-10-21 MED ORDER — DICLOFENAC SODIUM 1 % TD GEL: 1.0000 "application " | Freq: Three times a day (TID) | TRANSDERMAL | Status: DC

## 2013-10-21 NOTE — Telephone Encounter (Signed)
Oxycodone rx printed will call patient when ready.

## 2013-10-21 NOTE — Addendum Note (Signed)
Addended by: Amado Coe on: 10/21/2013 09:51 AM   Modules accepted: Orders

## 2013-10-21 NOTE — Progress Notes (Signed)
Subjective:    Patient ID: [name], female    DOB: [birth year], 68 y.o.   MRN: [id number]  HPI  [name] is back regarding her chronic pain issues. She has had further problems with her right eye. The right eye lids were partially sewn to protect the right globe.   She has been having more pain over the last few months, especially over her knees. The pain is worst along the top of the knee and radiates anteriorly. It bothers her more when she stands or walks. Her right face is tender as well. Her left foot was xrayed by orthopedics and injected apparently. She mentioned that the surgeon said she had arthritis. She is using voltaren gel for her feet/knee/legs.   She expressed to our CMA that she has thoughts of OD'ing but stated later, laughing, that she was just kidding around.   She is hoping to feel better when she spends some time with a friend on a vacation coming up.     Pain Inventory Average Pain 7 Pain Right Now 8 My pain is sharp, burning and aching  In the last 24 hours, has pain interfered with the following? General activity 10 Relation with others 10 Enjoyment of life 10 What TIME of day is your pain at its worst? varies Sleep (in general) Good  Pain is worse with: walking and standing Pain improves with: rest, heat/ice and medication Relief from Meds: 6  Mobility walk without assistance how many minutes can you walk? none without pain ability to climb steps?  yes do you drive?  no  Function I need assistance with the following:  household duties and shopping Do you have any goals in this area?  no  Neuro/Psych trouble walking depression anxiety loss of taste or smell suicidal thoughts  Prior Studies Any changes since last visit?  no  Physicians involved in your care Any changes since last visit?  no   Family History  Problem Relation Age of Onset  . Heart disease Father    History   Social History  . Marital Status: Legally  Separated    Spouse Name: N/A    Number of Children: N/A  . Years of Education: N/A   Social History Main Topics  . Smoking status: Current Some Day Smoker -- 0.50 packs/day for 30 years    Types: Cigarettes  . Smokeless tobacco: Never Used  . Alcohol Use: No  . Drug Use: No  . Sexual Activity: No   Other Topics Concern  . None   Social History Narrative  . None   Past Surgical History  Procedure Laterality Date  . Brain surgery      x3  . Appendectomy    . Joint replacement      thumbs and knees replacement  . Tubal ligation    . Back surgery      x2 back surgeries  . Eye surgery Right 05/24/12  . Bi-ventricular implantable cardioverter defibrillator  (crt-d)  2009; 05/2013    STJ CRTD generator change by Dr Caryl Comes 05/2013   Past Medical History  Diagnosis Date  . SOB (shortness of breath)   . CAD (coronary artery disease)     Prior LAD stenting with stent thrombosis occurring in a withdrawal phase of her Plavix undertaken because of trigeminal neuralgia and release surgery. It was treated medically.  . Anemia   . CHF (congestive heart failure)   . GERD (gastroesophageal reflux disease)   . Hyperlipidemia   .  Osteoarthritis   . Ischemic cardiomyopathy   . Complication of anesthesia     massive heart attack  . Cancer     small bladder cancer removed no  treatment  . Cataract    BP 111/65  Pulse 85  Resp 14  Ht 5\' 4"  (1.626 m)  Wt 152 lb (68.947 kg)  BMI 26.08 kg/m2  SpO2 96%  Opioid Risk Score:   Fall Risk Score: High Fall Risk (>13 points) (pt educated on fall risk, brochure given to pt previously)    Review of Systems  HENT:       Loss of taste or smell  Cardiovascular: Positive for leg swelling.  Gastrointestinal: Positive for constipation.  Musculoskeletal: Positive for gait problem.  Psychiatric/Behavioral: Positive for suicidal ideas. The patient is nervous/anxious.        Depression Suicidal thoughts- pt states she has a plan to take a overdose  of sleeping medications. Patient states "this is just a thought. I would never do it because I would go to hell." Pt has a psychiatrist. Dr. Naaman Plummer notified.    All other systems reviewed and are negative.      Objective:   Physical Exam  Constitutional: She is oriented to person, place, and time. She appears well-developed and well-nourished.  More alert today.  HENT:  Head: Normocephalic and atraumatic.  Eyes: right eye appears irritated, lateral aspect sutured together Neck: Normal range of motion. Neck supple.  Cardiovascular: Normal rate and regular rhythm.  Pulmonary/Chest: Effort normal. No respiratory distress.  Abdominal: She exhibits no distension.  Neurological: Balance better. Continued facial hypersensitivity on the right. Strength near 5/5. Sensation grossly in tact in the limbs except for below the knees where her fine touch is decreased.  Musculoskeletal: Lumbar spine tender with hyperextension and side bending, especially on the right. She had difficulty moving to neutral from a flexed position. Hands and toes are generally tender to touch and show chronic arthritic signs/ sclerosis. She has pain with palpation between the 3rd and 4th MT's. 3rd MT also tender to palaption., pain also increases with weight bearing.  Psych: . Mood is very pleasant. .  Assessment & Plan:   ASSESSMENT:  1. Right-sided trigeminal neuralgia.  2. Lumbar postlaminectomy syndrome.  3. Osteoarthritis of the knees.  4. Cardiomyopathy.  5. Weight loss  6. Peripheral neuropathy, risk factor for falls.  7. ?morton's neuroma left foot, OA of both feet   PLAN:  1. Changed her to oxycodone 15mg  q6 prn #100  2. Trileptal---continue 300mg  BID in addition to the gabapentin.  3. Rx for voltaren gel was written to help her hand and knee pain and foot pain. Needs to use QID to left foot.  4. Regarding her left foot, I asked her to bring xrays to next visit. Her pain may be partially OA, but location is  suspicious for a Morton's neuroma. Could consider injection depending upon symptoms.  5. My PA will see her back in about a month. 30 minutes of face to face patient care time were spent during this visit. All questions were encouraged and answered.   Marland Kitchen

## 2013-10-21 NOTE — Patient Instructions (Signed)
YOU NEED TO USE THE VOLTAREN GEL 4X PER DAY ON BOTH OF YOUR FEET AND KNEES

## 2013-11-01 ENCOUNTER — Encounter: Payer: Medicare Other | Admitting: Physical Medicine & Rehabilitation

## 2013-11-02 ENCOUNTER — Encounter: Payer: Self-pay | Admitting: *Deleted

## 2013-11-04 ENCOUNTER — Other Ambulatory Visit: Payer: Self-pay

## 2013-11-04 MED ORDER — CARVEDILOL 6.25 MG PO TABS: 6.2500 mg | ORAL_TABLET | Freq: Two times a day (BID) | ORAL | Status: DC

## 2013-11-21 ENCOUNTER — Encounter: Payer: Medicare Other | Attending: Physical Medicine & Rehabilitation | Admitting: Registered Nurse

## 2013-11-21 ENCOUNTER — Encounter: Payer: Self-pay | Admitting: Registered Nurse

## 2013-11-21 VITALS — BP 105/61 | HR 79 | Resp 14 | Wt 145.8 lb

## 2013-11-21 DIAGNOSIS — G500 Trigeminal neuralgia: Secondary | ICD-10-CM | POA: Diagnosis present

## 2013-11-21 DIAGNOSIS — M175 Other unilateral secondary osteoarthritis of knee: Secondary | ICD-10-CM | POA: Diagnosis present

## 2013-11-21 DIAGNOSIS — M172 Bilateral post-traumatic osteoarthritis of knee: Secondary | ICD-10-CM

## 2013-11-21 DIAGNOSIS — Z79899 Other long term (current) drug therapy: Secondary | ICD-10-CM

## 2013-11-21 DIAGNOSIS — Z5181 Encounter for therapeutic drug level monitoring: Secondary | ICD-10-CM

## 2013-11-21 MED ORDER — OXYCODONE HCL 15 MG PO TABS: 15.0000 mg | ORAL_TABLET | Freq: Four times a day (QID) | ORAL | Status: DC | PRN

## 2013-11-21 NOTE — Progress Notes (Signed)
Subjective:    Patient ID: [name], female    DOB: 11/11/1945, 68 y.o.   MRN: [id number]  HPI: [name] is a 68 year old female who returns for follow up for chronic pain and medication refill. She says she is pain free at this time. Her usual  pain is located on the right side of her face. Her exercise regime is walking.  She says she tripped over some shoes in her bedroom, and hit her left elbow on the wall. Didn't seek medical attention.  Pain Inventory Average Pain 3 Pain Right Now 3 My pain is intermittent, sharp and aching  In the last 24 hours, has pain interfered with the following? General activity 4 Relation with others 6 Enjoyment of life 7 What TIME of day is your pain at its worst? evening Sleep (in general) Good  Pain is worse with: walking, bending, standing and some activites Pain improves with: heat/ice and medication Relief from Meds: 7  Mobility walk without assistance how many minutes can you walk? 15-20 ability to climb steps?  yes do you drive?  no  Function disabled: date disabled . retired I need assistance with the following:  meal prep, household duties and shopping  Neuro/Psych weakness depression anxiety  Prior Studies Any changes since last visit?  no  Physicians involved in your care Any changes since last visit?  no   Family History  Problem Relation Age of Onset  . Heart disease Father    History   Social History  . Marital Status: Legally Separated    Spouse Name: N/A    Number of Children: N/A  . Years of Education: N/A   Social History Main Topics  . Smoking status: Current Some Day Smoker -- 0.50 packs/day for 30 years    Types: Cigarettes  . Smokeless tobacco: Never Used  . Alcohol Use: No  . Drug Use: No  . Sexual Activity: No   Other Topics Concern  . None   Social History Narrative  . None   Past Surgical History  Procedure Laterality Date  . Brain surgery      x3  . Appendectomy     . Joint replacement      thumbs and knees replacement  . Tubal ligation    . Back surgery      x2 back surgeries  . Eye surgery Right 05/24/12  . Bi-ventricular implantable cardioverter defibrillator  (crt-d)  2009; 05/2013    STJ CRTD generator change by Dr Caryl Comes 05/2013   Past Medical History  Diagnosis Date  . SOB (shortness of breath)   . CAD (coronary artery disease)     Prior LAD stenting with stent thrombosis occurring in a withdrawal phase of her Plavix undertaken because of trigeminal neuralgia and release surgery. It was treated medically.  . Anemia   . CHF (congestive heart failure)   . GERD (gastroesophageal reflux disease)   . Hyperlipidemia   . Osteoarthritis   . Ischemic cardiomyopathy   . Complication of anesthesia     massive heart attack  . Cancer     small bladder cancer removed no  treatment  . Cataract    BP 105/61  Pulse 79  Resp 14  Wt 145 lb 12.8 oz (66.134 kg)  SpO2 97%  Opioid Risk Score:   Fall Risk Score: Moderate Fall Risk (6-13 points) (previously educated and given handout)  Review of Systems  Neurological: Positive for weakness.  Psychiatric/Behavioral: Positive  for dysphoric mood. The patient is nervous/anxious.   All other systems reviewed and are negative.      Objective:   Physical Exam  Nursing note and vitals reviewed. Constitutional: She is oriented to person, place, and time. She appears well-developed and well-nourished.  HENT:  Head: Normocephalic and atraumatic.  Neck: Normal range of motion. Neck supple.  Cardiovascular: Normal rate and regular rhythm.   Pulmonary/Chest: Effort normal and breath sounds normal.  Musculoskeletal:  Normal Muscle Bulk and Muscle Testing Reveals: Upper Extremities: Full ROM and Muscle Strength 5/5 Lower Extremities: Full ROM and Muscle Strength 5/5 Arise from chair with ease Narrow Based Gait   Neurological: She is alert and oriented to person, place, and time.  Skin: Skin is warm and  dry.  Psychiatric: She has a normal mood and affect.          Assessment & Plan:  1. Right sided trigeminal neuralgia: Refilled: oxyCODONE 15,mg one tablet every 6 hours as needed # 100 tablets.  2. Lumbar postlaminectomy syndrome.: Stable at this time.  3. Osteoarthritis of the knees: Continue with Voltaren gel   15 minutes of face to face patient care time was spent during this visit. All questions were encouraged and answered.  F/U in 1 month

## 2013-12-02 ENCOUNTER — Other Ambulatory Visit (INDEPENDENT_AMBULATORY_CARE_PROVIDER_SITE_OTHER): Payer: Medicare Other

## 2013-12-02 DIAGNOSIS — E890 Postprocedural hypothyroidism: Secondary | ICD-10-CM

## 2013-12-02 LAB — TSH: TSH: 0.05 u[IU]/mL — ABNORMAL LOW (ref 0.35–4.50)

## 2013-12-02 LAB — T4, FREE: Free T4: 1.04 ng/dL (ref 0.60–1.60)

## 2013-12-08 ENCOUNTER — Ambulatory Visit: Payer: Medicare Other | Admitting: Endocrinology

## 2013-12-19 ENCOUNTER — Encounter: Payer: Medicare Other | Admitting: Registered Nurse

## 2013-12-22 ENCOUNTER — Encounter: Payer: Medicare Other | Attending: Physical Medicine & Rehabilitation | Admitting: Registered Nurse

## 2013-12-22 ENCOUNTER — Encounter: Payer: Self-pay | Admitting: Registered Nurse

## 2013-12-22 VITALS — BP 99/57 | HR 86 | Resp 14 | Ht 63.0 in | Wt 146.0 lb

## 2013-12-22 DIAGNOSIS — M172 Bilateral post-traumatic osteoarthritis of knee: Secondary | ICD-10-CM

## 2013-12-22 DIAGNOSIS — G500 Trigeminal neuralgia: Secondary | ICD-10-CM | POA: Diagnosis present

## 2013-12-22 DIAGNOSIS — Z5181 Encounter for therapeutic drug level monitoring: Secondary | ICD-10-CM

## 2013-12-22 DIAGNOSIS — M175 Other unilateral secondary osteoarthritis of knee: Secondary | ICD-10-CM | POA: Diagnosis present

## 2013-12-22 DIAGNOSIS — Z79899 Other long term (current) drug therapy: Secondary | ICD-10-CM

## 2013-12-22 MED ORDER — OXYCODONE HCL 15 MG PO TABS: 15.0000 mg | ORAL_TABLET | Freq: Four times a day (QID) | ORAL | Status: DC | PRN

## 2013-12-22 NOTE — Progress Notes (Signed)
Subjective:    Patient ID: [name], female    DOB: 1945-04-08, 68 y.o.   MRN: [id number]  HPI: [name] is a 68 year old female who returns for follow up for chronic pain and medication refill. She says her pain is located in her legs and left foot. She rates her pain 2.  Her exercise regime is walking.  She says three weeks ago she was walking in her kitchen and fell during the night, unable to explain the details. She called her PCP she didn't seek medical attention. Educated on falls prevention and verbalizes understanding.  Pain Inventory Average Pain 5 Pain Right Now 2 My pain is intermittent, sharp, burning, stabbing and aching  In the last 24 hours, has pain interfered with the following? General activity 2 Relation with others 3 Enjoyment of life 5 What TIME of day is your pain at its worst? all Sleep (in general) Good  Pain is worse with: walking, bending, standing and some activites Pain improves with: rest, heat/ice and medication Relief from Meds: 5  Mobility walk without assistance  Function not employed: date last employed 1989  Neuro/Psych weakness dizziness depression anxiety loss of taste or smell  Prior Studies Any changes since last visit?  no  Physicians involved in your care Any changes since last visit?  no   Family History  Problem Relation Age of Onset  . Heart disease Father    History   Social History  . Marital Status: Legally Separated    Spouse Name: N/A    Number of Children: N/A  . Years of Education: N/A   Social History Main Topics  . Smoking status: Current Some Day Smoker -- 0.50 packs/day for 30 years    Types: Cigarettes  . Smokeless tobacco: Never Used  . Alcohol Use: No  . Drug Use: No  . Sexual Activity: No   Other Topics Concern  . Not on file   Social History Narrative  . No narrative on file   Past Surgical History  Procedure Laterality Date  . Brain surgery      x3  .  Appendectomy    . Joint replacement      thumbs and knees replacement  . Tubal ligation    . Back surgery      x2 back surgeries  . Eye surgery Right 05/24/12  . Bi-ventricular implantable cardioverter defibrillator  (crt-d)  2009; 05/2013    STJ CRTD generator change by Dr Caryl Comes 05/2013   Past Medical History  Diagnosis Date  . SOB (shortness of breath)   . CAD (coronary artery disease)     Prior LAD stenting with stent thrombosis occurring in a withdrawal phase of her Plavix undertaken because of trigeminal neuralgia and release surgery. It was treated medically.  . Anemia   . CHF (congestive heart failure)   . GERD (gastroesophageal reflux disease)   . Hyperlipidemia   . Osteoarthritis   . Ischemic cardiomyopathy   . Complication of anesthesia     massive heart attack  . Cancer     small bladder cancer removed no  treatment  . Cataract    BP 99/57  Pulse 86  Resp 14  Ht 5\' 3"  (1.6 m)  Wt 146 lb (66.225 kg)  BMI 25.87 kg/m2  SpO2 89%  Opioid Risk Score:   Fall Risk Score: Low Fall Risk (0-5 points)   Review of Systems     Objective:   Physical Exam  Nursing note and vitals reviewed. Constitutional: She is oriented to person, place, and time. She appears well-developed and well-nourished.  HENT:  Head: Normocephalic and atraumatic.  Right Facial Tenderness Noted  Neck: Normal range of motion. Neck supple.  Cardiovascular: Normal rate and regular rhythm.   Pulmonary/Chest: Effort normal and breath sounds normal.  Musculoskeletal:  Normal Muscle Bulk and Muscle testing Reveals: Upper Extremities: Full ROM and Muscle strength 5/5 Lumbar Paraspinal Tenderness: L-3-L-4 Lower Extremities: Full ROM and Muscle Strength 5/5 Arises from chair with ease Narrow Based Gait  Neurological: She is alert and oriented to person, place, and time.  Skin: Skin is warm and dry.  Psychiatric: She has a normal mood and affect.          Assessment & Plan:  1. Right sided  trigeminal neuralgia: Refilled: oxyCODONE 15,mg one tablet every 6 hours as needed # 100 tablets.  2. Lumbar postlaminectomy syndrome.: Stable at this time.  3. Osteoarthritis of the knees: Continue with Voltaren gel   15 minutes of face to face patient care time was spent during this visit. All questions were encouraged and answered.   F/U in 1 month

## 2013-12-30 ENCOUNTER — Encounter: Payer: Self-pay | Admitting: Endocrinology

## 2013-12-30 ENCOUNTER — Ambulatory Visit (INDEPENDENT_AMBULATORY_CARE_PROVIDER_SITE_OTHER): Payer: Medicare Other | Admitting: Endocrinology

## 2013-12-30 ENCOUNTER — Other Ambulatory Visit: Payer: Self-pay | Admitting: *Deleted

## 2013-12-30 VITALS — BP 94/52 | HR 78 | Temp 98.6°F | Resp 14 | Ht 63.0 in | Wt 143.2 lb

## 2013-12-30 DIAGNOSIS — Z23 Encounter for immunization: Secondary | ICD-10-CM

## 2013-12-30 DIAGNOSIS — E890 Postprocedural hypothyroidism: Secondary | ICD-10-CM

## 2013-12-30 MED ORDER — LEVOTHYROXINE SODIUM 100 MCG PO TABS: 100.0000 ug | ORAL_TABLET | Freq: Every day | ORAL | Status: DC

## 2013-12-30 NOTE — Progress Notes (Signed)
Patient ID: [name], female   DOB: 06-Dec-1945, 68 y.o.   MRN: [id number]    Reason for Appointment:  Post ablative hypothyroidism, followup  History of Present Illness:   She became hypothyroid in 03/2012 following radioactive iodine treatment for hyperthyroidism in 11/13  She was initially started with 75 mcg of levothyroxine but with her TSH being nearly 50 in 3/14 the dose was increased to 137 mcg Subsequently has required relatively lower dose and since her visit in 7/15 has been taking 112 mcg Is difficult to identify hypothyroid symptoms since she tends to have chronic tiredness and sleepiness from her multiple medical problems  Does not complain of heat or cold intolerance, nervousness or palpitations She has lost a little weight  but does not complain of any shakiness She is quite compliant with taking her medication daily but takes it after breakfast for better tolerability  Even though her dose was reduced slightly on her last visit her TSH is now lower at 0.05   Wt Readings from Last 3 Encounters:  12/30/13 143 lb 3.2 oz (64.955 kg)  12/22/13 146 lb (66.225 kg)  11/21/13 145 lb 12.8 oz (66.134 kg)   Labs:  Lab Results  Component Value Date   FREET4 1.04 12/02/2013   FREET4 0.91 08/08/2013   FREET4 0.64 02/08/2013   TSH 0.05* 12/02/2013   TSH 0.19* 08/08/2013   TSH 2.29 02/08/2013        Medication List       This list is accurate as of: 12/30/13  2:14 PM.  Always use your most recent med list.               artificial tears ointment  Place into the right eye as needed.     aspirin 325 MG EC tablet  Take 325 mg by mouth daily.     bacitracin ophthalmic ointment     carvedilol 6.25 MG tablet  Commonly known as:  COREG  Take 1 tablet (6.25 mg total) by mouth 2 (two) times daily with a meal.     clorazepate 3.75 MG tablet  Commonly known as:  TRANXENE  Take 1 tablet by mouth 2 (two) times daily as needed.     diclofenac sodium 1 % Gel  Commonly  known as:  VOLTAREN  Apply 1 application topically 3 (three) times daily. Apply to knees and hands     furosemide 80 MG tablet  Commonly known as:  LASIX  Take 1 tablet (80 mg total) by mouth 2 (two) times daily.     gabapentin 800 MG tablet  Commonly known as:  NEURONTIN  Take 800 mg by mouth 4 (four) times daily as needed. For pain     iron polysaccharides 150 MG capsule  Commonly known as:  NIFEREX  Take 150 mg by mouth 2 (two) times daily.     levothyroxine 112 MCG tablet  Commonly known as:  SYNTHROID, LEVOTHROID  Take 1 tablet (112 mcg total) by mouth daily before breakfast.     NITROSTAT 0.4 MG SL tablet  Generic drug:  nitroGLYCERIN  TAKE 1 TABLET AS DIRECTED SUBLINGUALLY AS DIRECTED FOR CHEST PAIN     Oxcarbazepine 300 MG tablet  Commonly known as:  TRILEPTAL  TAKE 1 TABLET (300 MG TOTAL) BY MOUTH 2 (TWO) TIMES DAILY.     oxyCODONE 15 MG immediate release tablet  Commonly known as:  ROXICODONE  Take 1 tablet (15 mg total) by mouth every 6 (six) hours as needed  for pain.     potassium chloride SA 20 MEQ tablet  Commonly known as:  KLOR-CON M20  Take 1 tablet (20 mEq total) by mouth 2 (two) times daily.     prednisoLONE acetate 1 % ophthalmic suspension  Commonly known as:  PRED FORTE     promethazine 25 MG tablet  Commonly known as:  PHENERGAN  Take 1 tablet (25 mg total) by mouth every 6 (six) hours as needed. for nasuea     ramipril 2.5 MG capsule  Commonly known as:  ALTACE  Take 1 capsule (2.5 mg total) by mouth daily. daily     REFRESH TEARS 0.5 % Soln  Generic drug:  carboxymethylcellulose  1 drop.     simvastatin 10 MG tablet  Commonly known as:  ZOCOR  Take 1 tablet (10 mg total) by mouth daily.     sodium chloride 5 % ophthalmic ointment  Commonly known as:  MURO 086  Place 1 application into the right eye.     sodium chloride 2 % ophthalmic solution  Commonly known as:  MURO 128  Place 1 drop into the right eye.     spironolactone 25 MG  tablet  Commonly known as:  ALDACTONE  Take 1 tablet (25 mg total) by mouth daily.     tobramycin-dexamethasone ophthalmic solution  Commonly known as:  TOBRADEX     traMADol 50 MG tablet  Commonly known as:  ULTRAM  Take 1 tablet (50 mg total) by mouth every 6 (six) hours as needed.     valACYclovir 500 MG tablet  Commonly known as:  VALTREX  Take 1 tablet by mouth as directed.     venlafaxine 75 MG tablet  Commonly known as:  EFFEXOR  Take 75 mg by mouth 2 (two) times daily.     VIGAMOX 0.5 % ophthalmic solution  Generic drug:  moxifloxacin     zolpidem 10 MG tablet  Commonly known as:  AMBIEN  Take 10 mg by mouth at bedtime as needed. For sleep            Past Medical History  Diagnosis Date  . SOB (shortness of breath)   . CAD (coronary artery disease)     Prior LAD stenting with stent thrombosis occurring in a withdrawal phase of her Plavix undertaken because of trigeminal neuralgia and release surgery. It was treated medically.  . Anemia   . CHF (congestive heart failure)   . GERD (gastroesophageal reflux disease)   . Hyperlipidemia   . Osteoarthritis   . Ischemic cardiomyopathy   . Complication of anesthesia     massive heart attack  . Cancer     small bladder cancer removed no  treatment  . Cataract     Past Surgical History  Procedure Laterality Date  . Brain surgery      x3  . Appendectomy    . Joint replacement      thumbs and knees replacement  . Tubal ligation    . Back surgery      x2 back surgeries  . Eye surgery Right 05/24/12  . Bi-ventricular implantable cardioverter defibrillator  (crt-d)  2009; 05/2013    STJ CRTD generator change by Dr Caryl Comes 05/2013    Family History  Problem Relation Age of Onset  . Heart disease Father     Social History:  reports that she has been smoking Cigarettes.  She has a 15 pack-year smoking history. She has never used smokeless tobacco. She reports  that she does not drink alcohol or use illicit  drugs.  Allergies: No Known Allergies  Review of Systems:  CARDIOLOGY: she has history of coronary artery disease with multiple events and interventions History of COPD She has had systolic congestive heart failure History of chronic GI bleed and anemia History of trigeminal neuralgia in the past   .       No  history of Diabetes.     She has had mild chronic hair loss         Examination:   BP 94/52  Pulse 78  Temp(Src) 98.6 F (37 C)  Resp 14  Ht 5\' 3"  (1.6 m)  Wt 143 lb 3.2 oz (64.955 kg)  BMI 25.37 kg/m2  SpO2 97%   General Appearance: pleasant, not anxious, averagely built        Eyes:  external appearance normal  Neck: The thyroid is not palpable        Neurological: REFLEXES: at biceps are normal. No tremor present              Assessments   Post ablative hypothyroidism:  Although her TSH had been only minimally suppressed on the last visit and her dose was lowered her TSH has gone down further without increased free T4 However the test was done  a month ago This is despite any change in her medication regimen otherwise or weight change She is not symptomatic from the subclinical hyperthyroidism   Has been consistent with taking her medication daily We will reduce her dose to 100 mcg and come back in 2 months for repeat testing  Influenza vaccine given  [facility] 12/30/2013, 2:14 PM

## 2013-12-30 NOTE — Patient Instructions (Signed)
Change thyroid dose

## 2014-01-04 ENCOUNTER — Other Ambulatory Visit: Payer: Self-pay | Admitting: Physical Medicine & Rehabilitation

## 2014-01-16 ENCOUNTER — Other Ambulatory Visit: Payer: Self-pay | Admitting: Cardiology

## 2014-01-17 ENCOUNTER — Telehealth: Payer: Self-pay | Admitting: *Deleted

## 2014-01-17 DIAGNOSIS — G500 Trigeminal neuralgia: Secondary | ICD-10-CM

## 2014-01-17 DIAGNOSIS — M172 Bilateral post-traumatic osteoarthritis of knee: Secondary | ICD-10-CM

## 2014-01-17 NOTE — Telephone Encounter (Signed)
Pt would like to cancel appt on Thursday and pick up RX because going out of town.  Please let me know ASAP

## 2014-01-18 ENCOUNTER — Other Ambulatory Visit: Payer: Self-pay | Admitting: *Deleted

## 2014-01-18 DIAGNOSIS — M172 Bilateral post-traumatic osteoarthritis of knee: Secondary | ICD-10-CM

## 2014-01-18 DIAGNOSIS — G500 Trigeminal neuralgia: Secondary | ICD-10-CM

## 2014-01-18 MED ORDER — OXYCODONE HCL 15 MG PO TABS: 15.0000 mg | ORAL_TABLET | Freq: Four times a day (QID) | ORAL | Status: DC | PRN

## 2014-01-18 NOTE — Telephone Encounter (Signed)
Called patient and left message that her RX was ready for pick up

## 2014-01-18 NOTE — Telephone Encounter (Signed)
Narcotic rx printed for MD to sign for RN med refill visit 

## 2014-01-18 NOTE — Telephone Encounter (Signed)
I will allow this last time.  Please remind her and document that we WILL NOT DO THIS AGAIN

## 2014-01-19 ENCOUNTER — Encounter: Payer: Medicare Other | Admitting: Registered Nurse

## 2014-02-13 ENCOUNTER — Encounter: Payer: Self-pay | Admitting: Registered Nurse

## 2014-02-13 ENCOUNTER — Encounter: Payer: Medicare Other | Attending: Physical Medicine & Rehabilitation | Admitting: Registered Nurse

## 2014-02-13 VITALS — BP 106/68 | HR 84 | Resp 15 | Ht 62.0 in | Wt 138.0 lb

## 2014-02-13 DIAGNOSIS — G500 Trigeminal neuralgia: Secondary | ICD-10-CM

## 2014-02-13 DIAGNOSIS — M172 Bilateral post-traumatic osteoarthritis of knee: Secondary | ICD-10-CM | POA: Diagnosis not present

## 2014-02-13 DIAGNOSIS — Z5181 Encounter for therapeutic drug level monitoring: Secondary | ICD-10-CM

## 2014-02-13 DIAGNOSIS — M170 Bilateral primary osteoarthritis of knee: Secondary | ICD-10-CM

## 2014-02-13 DIAGNOSIS — Z79899 Other long term (current) drug therapy: Secondary | ICD-10-CM

## 2014-02-13 DIAGNOSIS — D3613 Benign neoplasm of peripheral nerves and autonomic nervous system of lower limb, including hip: Secondary | ICD-10-CM

## 2014-02-13 MED ORDER — OXYCODONE HCL 15 MG PO TABS: 15.0000 mg | ORAL_TABLET | Freq: Four times a day (QID) | ORAL | Status: DC | PRN

## 2014-02-13 NOTE — Progress Notes (Signed)
Subjective:    Patient ID: [name], female    DOB: 02/18/1946, 68 y.o.   MRN: [id number]  HPI: [name] is a 68 year old female who returns for follow up for chronic pain and medication refill. She says her pain is located in her bilateral knees right greater than left and left foot. She rates her pain 8. Her current exercise regime is walking.   Pain Inventory Average Pain 4 Pain Right Now 8 My pain is intermittent, sharp, dull, stabbing and aching  In the last 24 hours, has pain interfered with the following? General activity 10 Relation with others 10 Enjoyment of life 10 What TIME of day is your pain at its worst? VARIES Sleep (in general) Poor  Pain is worse with: walking, standing and some activites Pain improves with: rest and medication Relief from Meds: 5  Mobility walk without assistance how many minutes can you walk? 2-3 ability to climb steps?  no do you drive?  yes  Function retired  Neuro/Psych weakness trouble walking depression anxiety  Prior Studies Any changes since last visit?  no  Physicians involved in your care Any changes since last visit?  no   Family History  Problem Relation Age of Onset  . Heart disease Father    History   Social History  . Marital Status: Legally Separated    Spouse Name: N/A    Number of Children: N/A  . Years of Education: N/A   Social History Main Topics  . Smoking status: Current Some Day Smoker -- 0.50 packs/day for 30 years    Types: Cigarettes  . Smokeless tobacco: Never Used  . Alcohol Use: No  . Drug Use: No  . Sexual Activity: No   Other Topics Concern  . None   Social History Narrative   Past Surgical History  Procedure Laterality Date  . Brain surgery      x3  . Appendectomy    . Joint replacement      thumbs and knees replacement  . Tubal ligation    . Back surgery      x2 back surgeries  . Eye surgery Right 05/24/12  . Bi-ventricular implantable  cardioverter defibrillator  (crt-d)  2009; 05/2013    STJ CRTD generator change by Dr Caryl Comes 05/2013   Past Medical History  Diagnosis Date  . SOB (shortness of breath)   . CAD (coronary artery disease)     Prior LAD stenting with stent thrombosis occurring in a withdrawal phase of her Plavix undertaken because of trigeminal neuralgia and release surgery. It was treated medically.  . Anemia   . CHF (congestive heart failure)   . GERD (gastroesophageal reflux disease)   . Hyperlipidemia   . Osteoarthritis   . Ischemic cardiomyopathy   . Complication of anesthesia     massive heart attack  . Cancer     small bladder cancer removed no  treatment  . Cataract    BP 106/68 mmHg  Pulse 84  Resp 15  Ht 5\' 2"  (1.575 m)  Wt 138 lb (62.596 kg)  BMI 25.23 kg/m2  SpO2 98%  Opioid Risk Score:   Fall Risk Score: Low Fall Risk (0-5 points)  Review of Systems  HENT: Negative.   Eyes: Negative.   Respiratory: Negative.   Cardiovascular: Negative.   Gastrointestinal: Negative.   Endocrine: Positive for cold intolerance.  Musculoskeletal: Positive for myalgias, back pain, joint swelling and arthralgias.  Skin: Negative.  Allergic/Immunologic: Negative.   Neurological: Positive for weakness.  Hematological: Negative.   Psychiatric/Behavioral: The patient is nervous/anxious.        Objective:   Physical Exam  Constitutional: She is oriented to person, place, and time. She appears well-developed and well-nourished.  HENT:  Head: Normocephalic and atraumatic.  Neck: Normal range of motion. Neck supple.  Cardiovascular: Normal rate and regular rhythm.   Pulmonary/Chest: Effort normal and breath sounds normal.  Musculoskeletal:  Normal Muscle Bulk and Muscle testing Reveals: Upper Extremities: Full ROM and Muscle Strength 5/5 Lower Extremities: Full ROM and Muscle Strength 5/5 Left Leg Flexion Produces pain into Patella and causes Left foot Pain Arises from table with slight  difficulty Antalgic Gait  Neurological: She is alert and oriented to person, place, and time.  Skin: Skin is warm and dry.  Psychiatric: She has a normal mood and affect.  Nursing note and vitals reviewed.         Assessment & Plan:  1. Right sided trigeminal neuralgia: Refilled: oxyCODONE 15,mg one tablet every 6 hours as needed # 100 tablets.  2. Lumbar postlaminectomy syndrome.: Stable at this time.  3. Osteoarthritis of the knees: Continue with Voltaren gel  4. Morton's Neuroma: F/U with Podiatrist Dr. Paulla Dolly 02/22/14  15 minutes of face to face patient care time was spent during this visit. All questions were encouraged and answered.   F/U in 1 month

## 2014-02-16 ENCOUNTER — Ambulatory Visit (INDEPENDENT_AMBULATORY_CARE_PROVIDER_SITE_OTHER): Payer: Medicare Other

## 2014-02-16 ENCOUNTER — Ambulatory Visit (INDEPENDENT_AMBULATORY_CARE_PROVIDER_SITE_OTHER): Payer: Medicare Other | Admitting: Podiatry

## 2014-02-16 ENCOUNTER — Encounter: Payer: Self-pay | Admitting: Podiatry

## 2014-02-16 VITALS — BP 112/68 | HR 60 | Resp 16

## 2014-02-16 DIAGNOSIS — M779 Enthesopathy, unspecified: Secondary | ICD-10-CM

## 2014-02-16 DIAGNOSIS — M2042 Other hammer toe(s) (acquired), left foot: Secondary | ICD-10-CM

## 2014-02-16 MED ORDER — TRIAMCINOLONE ACETONIDE 10 MG/ML IJ SUSP
10.0000 mg | Freq: Once | INTRAMUSCULAR | Status: AC
  Administered 2014-02-16: 10 mg

## 2014-02-16 NOTE — Progress Notes (Signed)
   Subjective:    Patient ID: [name], female    DOB: 10-Feb-1946, 68 y.o.   MRN: [id number]  HPI Comments: "I have a neuroma"  Patient c/o aching plantar forefoot left for several months. She had surgery in 1996 in [place]. 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 Dr. Latanya Maudlin checked foot-xrayed-put in shots x 2.    Foot Pain Associated symptoms include arthralgias, fatigue, headaches, myalgias and weakness.      Review of Systems  Constitutional: Positive for fatigue.  Eyes: Positive for pain and visual disturbance.  Cardiovascular: Positive for leg swelling.  Musculoskeletal: Positive for myalgias, back pain and arthralgias.  Neurological: Positive for dizziness, weakness and headaches.  All other systems reviewed and are negative.      Objective:   Physical Exam        Assessment & Plan:

## 2014-02-16 NOTE — Progress Notes (Signed)
Subjective:     Patient ID: [name], female   DOB: 25-Mar-1946, 68 y.o.   MRN: [id number]  HPI patient points to the left forefoot stating that it's been painful in the joint area and that she's not been able to walk comfortably. She had several steroid injections which she could not describe but not in the area of her pain and it has remained a problem for her months   Review of Systems  All other systems reviewed and are negative.      Objective:   Physical Exam  Constitutional: She is oriented to person, place, and time.  Cardiovascular: Intact distal pulses.   Musculoskeletal: Normal range of motion.  Neurological: She is oriented to person, place, and time.  Skin: Skin is warm.  Nursing note and vitals reviewed.  neurovascular status intact with muscle strength adequate range of motion of the subtalar and midtarsal joint within normal limits. Patient is noted to have forefoot inflammation left with pain in the metatarsal phalangeal joint fourth that is sore and irritated when pressed with mild discomfort in the third and fifth but not to the same degree. Patient is well oriented 3 and is noted to have good digital perfusion but does have some thinness of her skin     Assessment:     Probable inflammatory capsulitis fourth metatarsophalangeal joint left    Plan:     H&P and x-ray reviewed and recommended careful capsular injection explaining risk to patient. Did a proximal nerve block and then aspirated the joint and I was able to get out a small amount of clear fluid and injected with half cc of dexamethasone Kenalog and applied thick plantar padding to reduce pressure on the joint surface. Reappoint to recheck again in 2 weeks or earlier if any issues should occur

## 2014-02-21 ENCOUNTER — Other Ambulatory Visit: Payer: Medicare Other

## 2014-03-01 ENCOUNTER — Ambulatory Visit: Payer: Medicare Other | Admitting: Endocrinology

## 2014-03-01 ENCOUNTER — Other Ambulatory Visit: Payer: Self-pay | Admitting: Physical Medicine & Rehabilitation

## 2014-03-09 ENCOUNTER — Encounter (HOSPITAL_COMMUNITY): Payer: Self-pay | Admitting: Internal Medicine

## 2014-03-09 ENCOUNTER — Ambulatory Visit: Payer: Medicare Other | Admitting: Podiatry

## 2014-03-13 ENCOUNTER — Ambulatory Visit (INDEPENDENT_AMBULATORY_CARE_PROVIDER_SITE_OTHER): Payer: Medicare Other | Admitting: Podiatry

## 2014-03-13 ENCOUNTER — Encounter: Payer: Self-pay | Admitting: Podiatry

## 2014-03-13 VITALS — BP 112/68 | HR 60 | Resp 16

## 2014-03-13 DIAGNOSIS — M779 Enthesopathy, unspecified: Secondary | ICD-10-CM

## 2014-03-14 ENCOUNTER — Encounter: Payer: Medicare Other | Admitting: Registered Nurse

## 2014-03-14 ENCOUNTER — Other Ambulatory Visit: Payer: Self-pay | Admitting: Cardiology

## 2014-03-15 ENCOUNTER — Telehealth: Payer: Self-pay | Admitting: *Deleted

## 2014-03-15 NOTE — Telephone Encounter (Signed)
Called patient and she states that her back is hurting so bad.  She went to her orthopedic on Monday and they told her its a muscle.  She is asking for something like Tramadol to get her through the weekend to her appt on Monday with the nurse.  Can I call her in the Tramadol 50 mg - please advise

## 2014-03-15 NOTE — Progress Notes (Signed)
Subjective:     Patient ID: [name], female   DOB: Apr 04, 1945, 68 y.o.   MRN: [id number]  HPI patient states my left foot is quite a bit better and the pain has improved with diminishment of swelling also noted   Review of Systems     Objective:   Physical Exam Neurovascular status unchanged with muscle strength adequate and noted to have inflammation left fourth metatarsal phalangeal joint that's improved by about 80% with mild discomfort when pressed from a dorsal direction    Assessment:     Improved capsulitis fourth MPJ left    Plan:     Reviewed condition and discussed rigid bottom shoes physical therapy stretching exercises and return if reoccurrence were to occur

## 2014-03-15 NOTE — Telephone Encounter (Signed)
Patient called - stated that she had to cancel her appt yesterday because she was too sick to come into the office. We set patient up with a nurse visit on Monday 12/21.  Patient stated that she is still sick and needs her meds because she is out today.  Really hurting.  Cannot wait until Monday

## 2014-03-15 NOTE — Telephone Encounter (Signed)
Why would tramadol work more than her oxycodone?  Why wouldn't we try a muscle relaxant if it's a muscle spasm. Better yet, if her orthopedist told her it was a muscle, why didn't he rx her a muscle relaxant?  Robaxin 500mg  q6 prn #30

## 2014-03-17 ENCOUNTER — Other Ambulatory Visit: Payer: Self-pay | Admitting: *Deleted

## 2014-03-17 DIAGNOSIS — M172 Bilateral post-traumatic osteoarthritis of knee: Secondary | ICD-10-CM

## 2014-03-17 DIAGNOSIS — Z5181 Encounter for therapeutic drug level monitoring: Secondary | ICD-10-CM

## 2014-03-17 DIAGNOSIS — M174 Other bilateral secondary osteoarthritis of knee: Secondary | ICD-10-CM

## 2014-03-17 DIAGNOSIS — G500 Trigeminal neuralgia: Secondary | ICD-10-CM

## 2014-03-17 MED ORDER — OXYCODONE HCL 15 MG PO TABS: 15.0000 mg | ORAL_TABLET | Freq: Four times a day (QID) | ORAL | Status: DC | PRN

## 2014-03-17 MED ORDER — METHOCARBAMOL 500 MG PO TABS: 500.0000 mg | ORAL_TABLET | Freq: Four times a day (QID) | ORAL | Status: DC

## 2014-03-17 NOTE — Telephone Encounter (Signed)
rx printed for MD to sign for oxycodone for RN med refill visit 03/20/14

## 2014-03-17 NOTE — Telephone Encounter (Signed)
Called patient and told her that Dr. Naaman Plummer ok RX for Robaxin 500mg  QID

## 2014-03-20 ENCOUNTER — Telehealth: Payer: Self-pay | Admitting: *Deleted

## 2014-03-20 NOTE — Telephone Encounter (Signed)
[facility] shows that [facility] filled her oxycodone 02/13/14 and she was scheduled to be out 03/14/14 and she was sick for her appt.  We could nto get Ravonda in to be seen before 03/20/14 and because Dr Naaman Plummer is out of the office until after Christmas and did not leave a bridge to get her through until the 21st, I spoke with Holley Raring and told her she could pick up her prescription on Friday since the rx was signed for the RN visit on Monday.  I explained to Dayzha this was a one time occurrence and would not happen again.  We are no longer allowing patients to pick up rx's without appts and no rx's on Friday.  She acknowledged understanding.  Future refills must accompany a visit and she must make arrangements to come to her scheduled appts.

## 2014-03-27 ENCOUNTER — Telehealth: Payer: Self-pay | Admitting: *Deleted

## 2014-03-27 NOTE — Telephone Encounter (Signed)
Prior Authorization for Methocarbomal 500 mg has been granted.....fax sent to scan center

## 2014-04-17 ENCOUNTER — Encounter: Payer: Medicare Other | Attending: Physical Medicine & Rehabilitation | Admitting: Registered Nurse

## 2014-04-17 ENCOUNTER — Encounter: Payer: Self-pay | Admitting: Registered Nurse

## 2014-04-17 VITALS — BP 113/64 | HR 70 | Resp 14

## 2014-04-17 DIAGNOSIS — Z5181 Encounter for therapeutic drug level monitoring: Secondary | ICD-10-CM

## 2014-04-17 DIAGNOSIS — G500 Trigeminal neuralgia: Secondary | ICD-10-CM | POA: Diagnosis present

## 2014-04-17 DIAGNOSIS — M172 Bilateral post-traumatic osteoarthritis of knee: Secondary | ICD-10-CM

## 2014-04-17 DIAGNOSIS — Z79899 Other long term (current) drug therapy: Secondary | ICD-10-CM

## 2014-04-17 MED ORDER — OXYCODONE HCL 15 MG PO TABS: 15.0000 mg | ORAL_TABLET | Freq: Four times a day (QID) | ORAL | Status: DC | PRN

## 2014-04-17 NOTE — Progress Notes (Signed)
Subjective:    Patient ID: [name], female    DOB: February 11, 1946, 69 y.o.   MRN: [id number]  HPI: [name] is a 69 year old female who returns for follow up for chronic pain and medication refill. She says her pain is located in her bilateral knees and lower extremities. She rates her pain 2. Her current exercise regime is walking.   Pain Inventory Average Pain 5 Pain Right Now 2 My pain is intermittent, sharp and aching  In the last 24 hours, has pain interfered with the following? General activity 5 Relation with others 6 Enjoyment of life 6 What TIME of day is your pain at its worst? daytime and evening  Sleep (in general) Good  Pain is worse with: walking, bending and standing Pain improves with: heat/ice and medication Relief from Meds: 7  Mobility walk without assistance ability to climb steps?  no do you drive?  yes  Function disabled: date disabled .  Neuro/Psych weakness trouble walking  Prior Studies Any changes since last visit?  no  Physicians involved in your care Any changes since last visit?  no   Family History  Problem Relation Age of Onset  . Heart disease Father    History   Social History  . Marital Status: Legally Separated    Spouse Name: N/A    Number of Children: N/A  . Years of Education: N/A   Social History Main Topics  . Smoking status: Current Some Day Smoker -- 0.50 packs/day for 30 years    Types: Cigarettes  . Smokeless tobacco: Never Used  . Alcohol Use: No  . Drug Use: No  . Sexual Activity: No   Other Topics Concern  . None   Social History Narrative   Past Surgical History  Procedure Laterality Date  . Brain surgery      x3  . Appendectomy    . Joint replacement      thumbs and knees replacement  . Tubal ligation    . Back surgery      x2 back surgeries  . Eye surgery Right 05/24/12  . Bi-ventricular implantable cardioverter defibrillator  (crt-d)  2009; 05/2013    STJ CRTD generator  change by Dr Caryl Comes 05/2013  . Biv pacemaker generator change out Bilateral 05/11/2013    Procedure: BIV PACEMAKER GENERATOR CHANGE OUT;  Surgeon: Deboraha Sprang, MD;  Location: [facility] CATH LAB;  Service: Cardiovascular;  Laterality: Bilateral;   Past Medical History  Diagnosis Date  . SOB (shortness of breath)   . CAD (coronary artery disease)     Prior LAD stenting with stent thrombosis occurring in a withdrawal phase of her Plavix undertaken because of trigeminal neuralgia and release surgery. It was treated medically.  . Anemia   . CHF (congestive heart failure)   . GERD (gastroesophageal reflux disease)   . Hyperlipidemia   . Osteoarthritis   . Ischemic cardiomyopathy   . Complication of anesthesia     massive heart attack  . Cancer     small bladder cancer removed no  treatment  . Cataract    BP 113/64 mmHg  Pulse 70  Resp 14  SpO2 99%  Opioid Risk Score:   Fall Risk Score: Low Fall Risk (0-5 points)  Review of Systems  HENT: Negative.   Eyes: Negative.   Respiratory: Negative.   Cardiovascular: Negative.   Gastrointestinal: Negative.   Endocrine: Negative.   Genitourinary: Negative.   Musculoskeletal: Positive for myalgias,  back pain and arthralgias.  Allergic/Immunologic: Negative.   Neurological: Positive for weakness.       Trouble walking  Hematological: Negative.   Psychiatric/Behavioral: Positive for decreased concentration.       Objective:   Physical Exam  Constitutional: She is oriented to person, place, and time. She appears well-developed and well-nourished.  HENT:  Head: Normocephalic and atraumatic.  Neck: Normal range of motion. Neck supple.  Cardiovascular: Normal rate and regular rhythm.   Pulmonary/Chest: Effort normal and breath sounds normal.  Musculoskeletal:  Normal Muscle Bulk and Muscle Testing Reveals: Upper Extremities: Full ROM and Muscle strength 5/5 Lower Extremities: Full ROM and Muscle strength 5/5 Right Lower Extremity Flexion  Produces pain into anterior aspect of foot. Arises from chair with ease Narrow Based Gait  Neurological: She is alert and oriented to person, place, and time.  Skin: Skin is warm and dry.  Psychiatric: She has a normal mood and affect.  Nursing note and vitals reviewed.         Assessment & Plan:  1. Right sided trigeminal neuralgia: Refilled: oxyCODONE 15 mg one tablet every 6 hours as needed # 100 tablets.  2. Lumbar postlaminectomy syndrome.: Stable at this time.  3. Osteoarthritis of the knees: Continue with Voltaren gel  4. Morton's Neuroma: F/U with Podiatrist Dr. Paulla Dolly.  15 minutes of face to face patient care time was spent during this visit. All questions were encouraged and answered.   F/U in 1 month

## 2014-04-26 ENCOUNTER — Ambulatory Visit (INDEPENDENT_AMBULATORY_CARE_PROVIDER_SITE_OTHER): Payer: Medicare Other | Admitting: Cardiology

## 2014-04-26 ENCOUNTER — Encounter: Payer: Self-pay | Admitting: Cardiology

## 2014-04-26 VITALS — BP 100/68 | HR 87 | Ht 62.0 in | Wt 138.0 lb

## 2014-04-26 DIAGNOSIS — Z95810 Presence of automatic (implantable) cardiac defibrillator: Secondary | ICD-10-CM

## 2014-04-26 DIAGNOSIS — I10 Essential (primary) hypertension: Secondary | ICD-10-CM

## 2014-04-26 DIAGNOSIS — I255 Ischemic cardiomyopathy: Secondary | ICD-10-CM

## 2014-04-26 DIAGNOSIS — Z720 Tobacco use: Secondary | ICD-10-CM

## 2014-04-26 NOTE — Progress Notes (Signed)
[facility]. [address], [address], [place]  [address] Phone: [phone] Fax:  [phone]  Date:  04/26/2014   ID:  [name], DOB 06-18-1945, MRN [id number]  PCP:  Shirline Frees, MD   History of Present Illness: [name] is a 69 y.o. female with ischemic cardiomyopathy ejection fraction in the 15-20% range, Ischemic cardiomyopathy status post Bi-V pacemaker, coronary artery disease with bare-metal stent implantation for in-stent restenosis here for followup. Dr. Caryl Comes who provided her with AV optimization of her device. She was told that her device at baseline was fairly synchronized.  She is battled with long-standing depression. Overall her breathing is improved.   In 2012, we had a long discussion about her cardiomyopathy and overall prognosis she has done remarkably well since then.  Appears sleepy. Sometimes she can feel shortness of breath when walking through her house. She is still smoking.    Wt Readings from Last 3 Encounters:  04/26/14 138 lb (62.596 kg)  02/13/14 138 lb (62.596 kg)  12/30/13 143 lb 3.2 oz (64.955 kg)     Past Medical History  Diagnosis Date  . SOB (shortness of breath)   . CAD (coronary artery disease)     Prior LAD stenting with stent thrombosis occurring in a withdrawal phase of her Plavix undertaken because of trigeminal neuralgia and release surgery. It was treated medically.  . Anemia   . CHF (congestive heart failure)   . GERD (gastroesophageal reflux disease)   . Hyperlipidemia   . Osteoarthritis   . Ischemic cardiomyopathy   . Complication of anesthesia     massive heart attack  . Cancer     small bladder cancer removed no  treatment  . Cataract     Past Surgical History  Procedure Laterality Date  . Brain surgery      x3  . Appendectomy    . Joint replacement      thumbs and knees replacement  . Tubal ligation    . Back surgery      x2 back surgeries  . Eye surgery Right 05/24/12  . Bi-ventricular  implantable cardioverter defibrillator  (crt-d)  2009; 05/2013    STJ CRTD generator change by Dr Caryl Comes 05/2013  . Biv pacemaker generator change out Bilateral 05/11/2013    Procedure: BIV PACEMAKER GENERATOR CHANGE OUT;  Surgeon: Deboraha Sprang, MD;  Location: [facility] CATH LAB;  Service: Cardiovascular;  Laterality: Bilateral;    Current Outpatient Prescriptions  Medication Sig Dispense Refill  . Artificial Tear Ointment (ARTIFICIAL TEARS) ointment Place into the right eye as needed.    Marland Kitchen aspirin 325 MG EC tablet Take 325 mg by mouth daily.    . bacitracin ophthalmic ointment     . carboxymethylcellulose (REFRESH TEARS) 0.5 % SOLN 1 drop.    . carvedilol (COREG) 6.25 MG tablet Take 1 tablet (6.25 mg total) by mouth 2 (two) times daily with a meal. 60 tablet 6  . clorazepate (TRANXENE) 3.75 MG tablet Take 1 tablet by mouth 2 (two) times daily as needed.     . diclofenac sodium (VOLTAREN) 1 % GEL Apply 1 application topically 3 (three) times daily. Apply to knees and hands 5 Tube 3  . furosemide (LASIX) 80 MG tablet Take 1 tablet (80 mg total) by mouth 2 (two) times daily. 90 tablet 6  . gabapentin (NEURONTIN) 800 MG tablet Take 800 mg by mouth 4 (four) times daily as needed. For pain    .  iron polysaccharides (NIFEREX) 150 MG capsule Take 150 mg by mouth 2 (two) times daily.    Marland Kitchen levothyroxine (SYNTHROID, LEVOTHROID) 100 MCG tablet Take 1 tablet (100 mcg total) by mouth daily. 90 tablet 3  . methocarbamol (ROBAXIN) 500 MG tablet Take 1 tablet (500 mg total) by mouth 4 (four) times daily. 120 tablet 2  . NITROSTAT 0.4 MG SL tablet TAKE 1 TABLET AS DIRECTED SUBLINGUALLY AS DIRECTED FOR CHEST PAIN 25 tablet 1  . Oxcarbazepine (TRILEPTAL) 300 MG tablet TAKE 1 TABLET (300 MG TOTAL) BY MOUTH 2 (TWO) TIMES DAILY. 60 tablet 3  . oxyCODONE (ROXICODONE) 15 MG immediate release tablet Take 1 tablet (15 mg total) by mouth every 6 (six) hours as needed for pain. 100 tablet 0  . potassium chloride SA (KLOR-CON M20)  20 MEQ tablet Take 1 tablet (20 mEq total) by mouth 2 (two) times daily. 60 tablet 4  . prednisoLONE acetate (PRED FORTE) 1 % ophthalmic suspension     . promethazine (PHENERGAN) 25 MG tablet Take 1 tablet (25 mg total) by mouth every 6 (six) hours as needed. for nasuea 90 tablet 1  . ramipril (ALTACE) 2.5 MG capsule TAKE 1 CAPSULE (2.5 MG TOTAL) BY MOUTH DAILY. DAILY 90 capsule 0  . simvastatin (ZOCOR) 10 MG tablet TAKE 1 TABLET BY MOUTH EVERY DAY 30 tablet 2  . sodium chloride (MURO 128) 5 % ophthalmic ointment Place 1 application into the right eye.    . spironolactone (ALDACTONE) 25 MG tablet Take 1 tablet (25 mg total) by mouth daily. 30 tablet 10  . tobramycin-dexamethasone (TOBRADEX) ophthalmic solution     . traMADol (ULTRAM) 50 MG tablet Take 1 tablet (50 mg total) by mouth every 6 (six) hours as needed. 60 tablet 3  . valACYclovir (VALTREX) 500 MG tablet Take 1 tablet by mouth as directed.    . venlafaxine (EFFEXOR) 75 MG tablet Take 75 mg by mouth 2 (two) times daily.     Marland Kitchen VIGAMOX 0.5 % ophthalmic solution     . zolpidem (AMBIEN) 10 MG tablet Take 10 mg by mouth at bedtime as needed. For sleep     No current facility-administered medications for this visit.    Allergies:   No Known Allergies  Social History:  The patient  reports that she has been smoking Cigarettes.  She has a 15 pack-year smoking history. She has never used smokeless tobacco. She reports that she does not drink alcohol or use illicit drugs.   ROS:  Please see the history of present illness.   Positive for depression, cardiomyopathy. No syncope, no fevers, no chest pain, no significant shortness of breath    PHYSICAL EXAM: VS:  BP 100/68 mmHg  Pulse 87  Ht 5\' 2"  (1.575 m)  Wt 138 lb (62.596 kg)  BMI 25.23 kg/m2 Well nourished, well developed, in no acute distress HEENT: normal. Corner sewn right eye Neck: no JVD Cardiac:  normal S1, S2; RRR; no murmur Lungs:  clear to auscultation bilaterally, no  wheezing, rhonchi or rales Abd: soft, nontender, no hepatomegaly Ext: trace edema Skin: warm and dry Neuro: no focal abnormalities noted  EKG:  04/26/14 - Ventricular pacing, sinus rhythm, 87.     ASSESSMENT AND PLAN:  1. Chronic systolic heart failure-currently well compensated. Ejection fraction of 20% range. No changes made in medications. NYHA class II symptoms. BP was low at one point in the 80s. Monitor. 2. Biventricular ICD-fairly well synchronized according to review of Dr. Olin Pia note. 3.  Hypertension-well-controlled. At times, slightly hypotensive. This may exacerbate dizziness. We have cut back her medications on previous visit. Continue as is. 4. Tobacco use-encouraged cessation. Her sister recently quit. 5. 6 months. Follow-up.  Signed, Candee Furbish, MD [facility]  04/26/2014 2:34 PM

## 2014-04-26 NOTE — Patient Instructions (Signed)
Your physician recommends that you continue on your current medications as directed. Please refer to the Current Medication list given to you today.  Your physician wants you to follow-up in: 6 months with Dr. Skains. You will receive a reminder letter in the mail two months in advance. If you don't receive a letter, please call our office to schedule the follow-up appointment.  

## 2014-04-27 ENCOUNTER — Other Ambulatory Visit: Payer: Self-pay | Admitting: Cardiology

## 2014-05-09 ENCOUNTER — Telehealth: Payer: Self-pay | Admitting: Cardiology

## 2014-05-09 MED ORDER — SPIRONOLACTONE 25 MG PO TABS
25.0000 mg | ORAL_TABLET | Freq: Every day | ORAL | Status: DC
Start: 2014-05-09 — End: 2014-11-23

## 2014-05-09 NOTE — Telephone Encounter (Signed)
New message      Please refill spiranolactone to [facility] at [address]

## 2014-05-09 NOTE — Telephone Encounter (Signed)
Pt calling asking if she still has CHF.  Advised her that she does.  She is c/o feeling bloated, SOB but with no change in her wt.  She reports eating a lot of deli meat recently and that she has been out of her spironolactone.  Advised her to stop eating foods high in NA and reviewed a list of them. Advised her to limit fluid intake and restart spironolactone.  RX to be sent into pharmacy.

## 2014-05-09 NOTE — Telephone Encounter (Signed)
New message      Have a few questions for the nurse.  Pt would not tell me what she wanted

## 2014-05-12 ENCOUNTER — Ambulatory Visit: Payer: Medicare Other | Admitting: Registered Nurse

## 2014-05-25 ENCOUNTER — Other Ambulatory Visit: Payer: Self-pay | Admitting: Registered Nurse

## 2014-05-25 ENCOUNTER — Encounter: Payer: Self-pay | Admitting: Registered Nurse

## 2014-05-25 ENCOUNTER — Encounter: Payer: Medicare Other | Attending: Physical Medicine & Rehabilitation | Admitting: Registered Nurse

## 2014-05-25 VITALS — BP 107/59 | HR 69 | Resp 14

## 2014-05-25 DIAGNOSIS — Z5181 Encounter for therapeutic drug level monitoring: Secondary | ICD-10-CM

## 2014-05-25 DIAGNOSIS — M172 Bilateral post-traumatic osteoarthritis of knee: Secondary | ICD-10-CM | POA: Diagnosis not present

## 2014-05-25 DIAGNOSIS — G500 Trigeminal neuralgia: Secondary | ICD-10-CM | POA: Insufficient documentation

## 2014-05-25 DIAGNOSIS — G894 Chronic pain syndrome: Secondary | ICD-10-CM

## 2014-05-25 DIAGNOSIS — Z79899 Other long term (current) drug therapy: Secondary | ICD-10-CM

## 2014-05-25 MED ORDER — TRAMADOL HCL 50 MG PO TABS
50.0000 mg | ORAL_TABLET | Freq: Four times a day (QID) | ORAL | Status: DC | PRN
Start: 2014-05-25 — End: 2014-08-18

## 2014-05-25 MED ORDER — OXYCODONE HCL 15 MG PO TABS: 15.0000 mg | ORAL_TABLET | Freq: Four times a day (QID) | ORAL | Status: DC | PRN

## 2014-05-25 NOTE — Progress Notes (Signed)
Subjective:    Patient ID: [name], female    DOB: Aug 01, 1945, 69 y.o.   MRN: [id number]  HPI: [name] is a 69 year old female who returns for follow up for chronic pain and medication refill. She says her pain is located in her face ( right side), bilateral knees and lower extremities. She rates her pain 8. Her current exercise regime is walking.   Pain Inventory Average Pain 7 Pain Right Now 8 My pain is intermittent, sharp, burning, stabbing and aching  In the last 24 hours, has pain interfered with the following? General activity 7 Relation with others 8 Enjoyment of life 8 What TIME of day is your pain at its worst? morning, evening, night  Sleep (in general) Good  Pain is worse with: walking, bending and standing Pain improves with: rest, heat/ice and medication Relief from Meds: 6  Mobility walk without assistance ability to climb steps?  yes do you drive?  no Do you have any goals in this area?  no  Function not employed: date last employed . disabled: date disabled . I need assistance with the following:  household duties Do you have any goals in this area?  no  Neuro/Psych weakness dizziness depression anxiety loss of taste or smell  Prior Studies Any changes since last visit?  no  Physicians involved in your care Any changes since last visit?  no   Family History  Problem Relation Age of Onset  . Heart disease Father    History   Social History  . Marital Status: Legally Separated    Spouse Name: N/A  . Number of Children: N/A  . Years of Education: N/A   Social History Main Topics  . Smoking status: Current Some Day Smoker -- 0.50 packs/day for 30 years    Types: Cigarettes  . Smokeless tobacco: Never Used  . Alcohol Use: No  . Drug Use: No  . Sexual Activity: No   Other Topics Concern  . None   Social History Narrative   Past Surgical History  Procedure Laterality Date  . Brain surgery      x3  .  Appendectomy    . Joint replacement      thumbs and knees replacement  . Tubal ligation    . Back surgery      x2 back surgeries  . Eye surgery Right 05/24/12  . Bi-ventricular implantable cardioverter defibrillator  (crt-d)  2009; 05/2013    STJ CRTD generator change by Dr Caryl Comes 05/2013  . Biv pacemaker generator change out Bilateral 05/11/2013    Procedure: BIV PACEMAKER GENERATOR CHANGE OUT;  Surgeon: Deboraha Sprang, MD;  Location: [facility] CATH LAB;  Service: Cardiovascular;  Laterality: Bilateral;   Past Medical History  Diagnosis Date  . SOB (shortness of breath)   . CAD (coronary artery disease)     Prior LAD stenting with stent thrombosis occurring in a withdrawal phase of her Plavix undertaken because of trigeminal neuralgia and release surgery. It was treated medically.  . Anemia   . CHF (congestive heart failure)   . GERD (gastroesophageal reflux disease)   . Hyperlipidemia   . Osteoarthritis   . Ischemic cardiomyopathy   . Complication of anesthesia     massive heart attack  . Cancer     small bladder cancer removed no  treatment  . Cataract    There were no vitals taken for this visit.  Opioid Risk Score:   Fall Risk  Score: Moderate Fall Risk (6-13 points)   Review of Systems  Neurological: Positive for dizziness and weakness.  Psychiatric/Behavioral: Positive for dysphoric mood. The patient is nervous/anxious.   All other systems reviewed and are negative.      Objective:   Physical Exam  Constitutional: She is oriented to person, place, and time. She appears well-developed and well-nourished.  HENT:  Head: Normocephalic and atraumatic.  Right Facial Tenderness  Neck: Normal range of motion. Neck supple.  Cardiovascular: Normal rate and regular rhythm.   Pulmonary/Chest: Effort normal and breath sounds normal.  Musculoskeletal:  Normal Muscle Bulk and Muscle Testing Reveals: Upper Extremities: Full ROM and Muscle strength 5/5 Lower Extremities: Full ROM and  Muscle Strength 5/5 Arises from chair with ease Narrow Based Gait  Neurological: She is alert and oriented to person, place, and time.  Skin: Skin is warm and dry.  Psychiatric: She has a normal mood and affect.  Nursing note and vitals reviewed.         Assessment & Plan:  1. Right sided trigeminal neuralgia: Refilled: oxyCODONE 15 mg one tablet every 6 hours as needed # 100 tablets.  2. Lumbar postlaminectomy syndrome.: Stable at this time.  3. Osteoarthritis of the knees: Continue with Voltaren gel  4. Morton's Neuroma: F/U with Podiatrist Dr. Paulla Dolly.  15 minutes of face to face patient care time was spent during this visit. All questions were encouraged and answered.   F/U in 1 month

## 2014-05-26 LAB — PMP ALCOHOL METABOLITE (ETG): ETGU: NEGATIVE ng/mL

## 2014-05-30 LAB — ZOLPIDEM (LC/MS-MS), URINE
ZOLPIDEM METABOLITE (GC/LS/MS) UR, CONFIRM: 10 ng/mL (ref <5)
Zolpidem (GC/LC/MS), Ur confirm: NEGATIVE ng/mL — AB (ref <5)

## 2014-05-30 LAB — OXYCODONE, URINE (LC/MS-MS)
Noroxycodone, Ur: 1105 ng/mL (ref <50)
Oxycodone, ur: NEGATIVE ng/mL — AB (ref <50)
Oxymorphone: 519 ng/mL (ref <50)

## 2014-05-30 LAB — BENZODIAZEPINES (GC/LC/MS), URINE
Alprazolam metabolite (GC/LC/MS), ur confirm: NEGATIVE ng/mL (ref <25)
Clonazepam metabolite (GC/LC/MS), ur confirm: NEGATIVE ng/mL (ref <25)
FLURAZEPAMU: NEGATIVE ng/mL (ref <50)
Lorazepam (GC/LC/MS), ur confirm: NEGATIVE ng/mL (ref <50)
Midazolam (GC/LC/MS), ur confirm: NEGATIVE ng/mL (ref <50)
NORDIAZEPAMU: NEGATIVE ng/mL (ref <50)
OXAZEPAMU: 12000 ng/mL — AB (ref <50)
Temazepam (GC/LC/MS), ur confirm: NEGATIVE ng/mL (ref <50)
Triazolam metabolite (GC/LC/MS), ur confirm: NEGATIVE ng/mL (ref <50)

## 2014-05-31 LAB — PRESCRIPTION MONITORING PROFILE (SOLSTAS)
AMPHETAMINE/METH: NEGATIVE ng/mL
BUPRENORPHINE, URINE: NEGATIVE ng/mL
Barbiturate Screen, Urine: NEGATIVE ng/mL
CANNABINOID SCRN UR: NEGATIVE ng/mL
Carisoprodol, Urine: NEGATIVE ng/mL
Cocaine Metabolites: NEGATIVE ng/mL
Creatinine, Urine: 204.59 mg/dL (ref >20.0)
FENTANYL URINE: NEGATIVE ng/mL
MDMA URINE: NEGATIVE ng/mL
MEPERIDINE UR: NEGATIVE ng/mL
Methadone Screen, Urine: NEGATIVE ng/mL
NITRITES URINE, INITIAL: NEGATIVE ug/mL
Opiate Screen, Urine: NEGATIVE ng/mL
PROPOXYPHENE: NEGATIVE ng/mL
TAPENTADOLUR: NEGATIVE ng/mL
Tramadol Scrn, Ur: NEGATIVE ng/mL
pH, Initial: 8 pH (ref 4.5–8.9)

## 2014-06-09 ENCOUNTER — Other Ambulatory Visit: Payer: Self-pay | Admitting: Cardiology

## 2014-06-16 NOTE — Progress Notes (Signed)
Urine drug screen for this encounter is consistent for prescribed medication 

## 2014-06-20 ENCOUNTER — Encounter: Payer: Medicare Other | Admitting: Registered Nurse

## 2014-06-21 ENCOUNTER — Encounter: Payer: Medicare Other | Attending: Physical Medicine & Rehabilitation | Admitting: Registered Nurse

## 2014-06-21 ENCOUNTER — Other Ambulatory Visit: Payer: Self-pay | Admitting: Family Medicine

## 2014-06-21 ENCOUNTER — Encounter: Payer: Self-pay | Admitting: Registered Nurse

## 2014-06-21 VITALS — BP 102/60 | HR 66 | Resp 14

## 2014-06-21 DIAGNOSIS — R51 Headache: Secondary | ICD-10-CM

## 2014-06-21 DIAGNOSIS — Z5181 Encounter for therapeutic drug level monitoring: Secondary | ICD-10-CM | POA: Diagnosis not present

## 2014-06-21 DIAGNOSIS — Z79899 Other long term (current) drug therapy: Secondary | ICD-10-CM | POA: Diagnosis not present

## 2014-06-21 DIAGNOSIS — M172 Bilateral post-traumatic osteoarthritis of knee: Secondary | ICD-10-CM | POA: Diagnosis present

## 2014-06-21 DIAGNOSIS — G500 Trigeminal neuralgia: Secondary | ICD-10-CM | POA: Diagnosis not present

## 2014-06-21 DIAGNOSIS — R2689 Other abnormalities of gait and mobility: Secondary | ICD-10-CM

## 2014-06-21 DIAGNOSIS — R519 Headache, unspecified: Secondary | ICD-10-CM

## 2014-06-21 DIAGNOSIS — G894 Chronic pain syndrome: Secondary | ICD-10-CM | POA: Diagnosis not present

## 2014-06-21 DIAGNOSIS — W19XXXA Unspecified fall, initial encounter: Secondary | ICD-10-CM

## 2014-06-21 MED ORDER — OXYCODONE HCL 15 MG PO TABS: 15.0000 mg | ORAL_TABLET | Freq: Four times a day (QID) | ORAL | Status: DC | PRN

## 2014-06-21 NOTE — Progress Notes (Signed)
Subjective:    Patient ID: [name], female    DOB: 1946/02/04, 69 y.o.   MRN: [id number]  HPI: [name] is a 69 year old female who returns for follow up for chronic pain and medication refill. She says her pain is located in her fight temporal pain.She rates her pain 3.Her current exercise regime is walking.  On 06/18/14 she had two falls minutes apart. She was walking in the kitchen lost her footing landed on her buttocks. She didn't seek medical attention. She hit her head on the kitchen table she was in contact with her PCP.They are following. Pain Inventory Average Pain 6 Pain Right Now 3 My pain is constant, dull, tingling and aching  In the last 24 hours, has pain interfered with the following? General activity 6 Relation with others 6 Enjoyment of life 6 What TIME of day is your pain at its worst? daytime Sleep (in general) Poor  Pain is worse with: walking, bending, standing and some activites Pain improves with: rest and medication Relief from Meds: 3  Mobility walk without assistance how many minutes can you walk? 5 ability to climb steps?  no do you drive?  no  Function retired  Neuro/Psych weakness trouble walking confusion  Prior Studies Any changes since last visit?  no  Physicians involved in your care Any changes since last visit?  no   Family History  Problem Relation Age of Onset  . Heart disease Father    History   Social History  . Marital Status: Legally Separated    Spouse Name: N/A  . Number of Children: N/A  . Years of Education: N/A   Social History Main Topics  . Smoking status: Current Some Day Smoker -- 0.50 packs/day for 30 years    Types: Cigarettes  . Smokeless tobacco: Never Used  . Alcohol Use: No  . Drug Use: No  . Sexual Activity: No   Other Topics Concern  . None   Social History Narrative   Past Surgical History  Procedure Laterality Date  . Brain surgery      x3  . Appendectomy    .  Joint replacement      thumbs and knees replacement  . Tubal ligation    . Back surgery      x2 back surgeries  . Eye surgery Right 05/24/12  . Bi-ventricular implantable cardioverter defibrillator  (crt-d)  2009; 05/2013    STJ CRTD generator change by Dr Caryl Comes 05/2013  . Biv pacemaker generator change out Bilateral 05/11/2013    Procedure: BIV PACEMAKER GENERATOR CHANGE OUT;  Surgeon: Deboraha Sprang, MD;  Location: [facility] CATH LAB;  Service: Cardiovascular;  Laterality: Bilateral;   Past Medical History  Diagnosis Date  . SOB (shortness of breath)   . CAD (coronary artery disease)     Prior LAD stenting with stent thrombosis occurring in a withdrawal phase of her Plavix undertaken because of trigeminal neuralgia and release surgery. It was treated medically.  . Anemia   . CHF (congestive heart failure)   . GERD (gastroesophageal reflux disease)   . Hyperlipidemia   . Osteoarthritis   . Ischemic cardiomyopathy   . Complication of anesthesia     massive heart attack  . Cancer     small bladder cancer removed no  treatment  . Cataract    BP 102/60 mmHg  Pulse 66  Resp 14  SpO2 94%  Opioid Risk Score:   Fall Risk Score:  Moderate Fall Risk (6-13 points)`1  Depression screen PHQ 2/9  No flowsheet data found.    Review of Systems  HENT: Negative.   Eyes: Negative.   Respiratory: Negative.   Cardiovascular: Negative.   Gastrointestinal: Negative.   Endocrine: Negative.   Genitourinary: Negative.   Musculoskeletal: Positive for myalgias, back pain and arthralgias.  Skin: Negative.   Allergic/Immunologic: Negative.   Neurological: Positive for weakness.       Trouble walking  Hematological: Negative.   Psychiatric/Behavioral: Positive for confusion.       Objective:   Physical Exam  Constitutional: She is oriented to person, place, and time. She appears well-developed and well-nourished.  HENT:  Head: Normocephalic and atraumatic.  Neck: Normal range of motion. Neck  supple.  Cardiovascular: Normal rate and regular rhythm.   Pulmonary/Chest: Effort normal and breath sounds normal.  Musculoskeletal:  Normal Muscle Bulk and Muscle Testing Reveals: Upper Extremities: Full ROM and Muscle strength 5/5 Lower Extremities: Full ROM and Muscle strength 5/5 Arises from chair with ease Narrow Based gait  Neurological: She is alert and oriented to person, place, and time.  Skin: Skin is warm and dry.  Psychiatric: She has a normal mood and affect.  Nursing note and vitals reviewed.         Assessment & Plan:  1. Right sided trigeminal neuralgia: Refilled: oxyCODONE 15 mg one tablet every 6 hours as needed # 100 tablets.  2. Lumbar postlaminectomy syndrome.: Stable at this time.  3. Osteoarthritis of the knees: Continue with Voltaren gel  4. Morton's Neuroma: F/U with Podiatrist Dr. Paulla Dolly.  15 minutes of face to face patient care time was spent during this visit. All questions were encouraged and answered.   F/U in 1 month

## 2014-06-22 ENCOUNTER — Other Ambulatory Visit: Payer: Self-pay | Admitting: Family Medicine

## 2014-06-22 DIAGNOSIS — R2689 Other abnormalities of gait and mobility: Secondary | ICD-10-CM

## 2014-06-22 DIAGNOSIS — W19XXXA Unspecified fall, initial encounter: Secondary | ICD-10-CM

## 2014-06-22 DIAGNOSIS — R519 Headache, unspecified: Secondary | ICD-10-CM

## 2014-06-22 DIAGNOSIS — R51 Headache: Secondary | ICD-10-CM

## 2014-06-26 ENCOUNTER — Ambulatory Visit
Admission: RE | Admit: 2014-06-26 | Discharge: 2014-06-26 | Disposition: A | Discharge status: Home / Self Care | Payer: Medicare Other | Source: Ambulatory Visit | Attending: Family Medicine | Admitting: Family Medicine

## 2014-06-26 DIAGNOSIS — R519 Headache, unspecified: Secondary | ICD-10-CM

## 2014-06-26 DIAGNOSIS — W19XXXA Unspecified fall, initial encounter: Secondary | ICD-10-CM

## 2014-06-26 DIAGNOSIS — R2689 Other abnormalities of gait and mobility: Secondary | ICD-10-CM

## 2014-06-26 DIAGNOSIS — R51 Headache: Secondary | ICD-10-CM

## 2014-06-26 MED ORDER — IOPAMIDOL (ISOVUE-300) INJECTION 61%
75.0000 mL | Freq: Once | INTRAVENOUS | Status: AC | PRN
  Administered 2014-06-26: 75 mL via INTRAVENOUS

## 2014-07-03 ENCOUNTER — Other Ambulatory Visit (INDEPENDENT_AMBULATORY_CARE_PROVIDER_SITE_OTHER): Payer: Medicare Other

## 2014-07-03 DIAGNOSIS — E890 Postprocedural hypothyroidism: Secondary | ICD-10-CM

## 2014-07-03 LAB — T4, FREE: Free T4: 0.91 ng/dL (ref 0.60–1.60)

## 2014-07-03 LAB — TSH: TSH: 3.07 u[IU]/mL (ref 0.35–4.50)

## 2014-07-06 ENCOUNTER — Encounter: Payer: Self-pay | Admitting: Endocrinology

## 2014-07-06 ENCOUNTER — Ambulatory Visit (INDEPENDENT_AMBULATORY_CARE_PROVIDER_SITE_OTHER): Payer: Medicare Other | Admitting: Endocrinology

## 2014-07-06 VITALS — BP 110/70 | HR 64 | Temp 98.5°F | Wt 128.2 lb

## 2014-07-06 DIAGNOSIS — E890 Postprocedural hypothyroidism: Secondary | ICD-10-CM

## 2014-07-06 NOTE — Progress Notes (Signed)
Pre visit review using our clinic review tool, if applicable. No additional management support is needed unless otherwise documented below in the visit note. 

## 2014-07-06 NOTE — Patient Instructions (Signed)
Same same dose

## 2014-07-06 NOTE — Progress Notes (Signed)
Patient ID: [name], female   DOB: 1945-10-02, 69 y.o.   MRN: [id number]    Reason for Appointment:  Post ablative hypothyroidism, followup  History of Present Illness:    She became hypothyroid in 03/2012 following radioactive iodine treatment for hyperthyroidism in 11/13  She was initially started with 75 mcg of levothyroxine but with her TSH being nearly 50 in 3/14 the dose was increased to 137 mcg Subsequently has required relatively lower dose and since  7/15 had been taking 112 mcg  In 11/2013 because of her TSH being significant suppressed her dose was reduced to 100 g but she did not follow-up subsequently  Is difficult to identify hypothyroid symptoms since she tends to have chronic tiredness and sleepiness from her multiple medical problems Also complaining of more hair loss lately Does not complain of heat or cold intolerance She has lost weight and she does not know why  She is quite compliant with taking her medication daily but takes it after breakfast for better tolerability   Wt Readings from Last 3 Encounters:  07/06/14 128 lb 3.2 oz (58.151 kg)  04/26/14 138 lb (62.596 kg)  02/13/14 138 lb (62.596 kg)   Labs:  Lab Results  Component Value Date   TSH 3.07 07/03/2014   TSH 0.05* 12/02/2013   TSH 0.19* 08/08/2013   FREET4 0.91 07/03/2014   FREET4 1.04 12/02/2013   FREET4 0.91 08/08/2013         Medication List       This list is accurate as of: 07/06/14  3:02 PM.  Always use your most recent med list.               artificial tears ointment  Place into the right eye as needed.     aspirin 325 MG EC tablet  Take 325 mg by mouth daily.     bacitracin ophthalmic ointment     carvedilol 6.25 MG tablet  Commonly known as:  COREG  Take 1 tablet (6.25 mg total) by mouth 2 (two) times daily with a meal.     clorazepate 3.75 MG tablet  Commonly known as:  TRANXENE  Take 1 tablet by mouth 2 (two) times daily as needed.     diclofenac  sodium 1 % Gel  Commonly known as:  VOLTAREN  Apply 1 application topically 3 (three) times daily. Apply to knees and hands     furosemide 80 MG tablet  Commonly known as:  LASIX  Take 1 tablet (80 mg total) by mouth 2 (two) times daily.     gabapentin 800 MG tablet  Commonly known as:  NEURONTIN  Take 800 mg by mouth 4 (four) times daily as needed. For pain     iron polysaccharides 150 MG capsule  Commonly known as:  NIFEREX  Take 150 mg by mouth 2 (two) times daily.     levothyroxine 100 MCG tablet  Commonly known as:  SYNTHROID, LEVOTHROID  Take 1 tablet (100 mcg total) by mouth daily.     methocarbamol 500 MG tablet  Commonly known as:  ROBAXIN  Take 1 tablet (500 mg total) by mouth 4 (four) times daily.     NITROSTAT 0.4 MG SL tablet  Generic drug:  nitroGLYCERIN  TAKE 1 TABLET AS DIRECTED SUBLINGUALLY AS DIRECTED FOR CHEST PAIN     Oxcarbazepine 300 MG tablet  Commonly known as:  TRILEPTAL  TAKE 1 TABLET (300 MG TOTAL) BY MOUTH 2 (TWO) TIMES DAILY.  oxyCODONE 15 MG immediate release tablet  Commonly known as:  ROXICODONE  Take 1 tablet (15 mg total) by mouth every 6 (six) hours as needed for pain.     potassium chloride SA 20 MEQ tablet  Commonly known as:  KLOR-CON M20  Take 1 tablet (20 mEq total) by mouth 2 (two) times daily.     prednisoLONE acetate 1 % ophthalmic suspension  Commonly known as:  PRED FORTE     promethazine 25 MG tablet  Commonly known as:  PHENERGAN  Take 1 tablet (25 mg total) by mouth every 6 (six) hours as needed. for nasuea     ramipril 2.5 MG capsule  Commonly known as:  ALTACE  TAKE 1 CAPSULE (2.5 MG TOTAL) BY MOUTH DAILY. DAILY     REFRESH TEARS 0.5 % Soln  Generic drug:  carboxymethylcellulose  1 drop.     simvastatin 10 MG tablet  Commonly known as:  ZOCOR  TAKE 1 TABLET BY MOUTH EVERY DAY     sodium chloride 5 % ophthalmic ointment  Commonly known as:  MURO 409  Place 1 application into the right eye.      spironolactone 25 MG tablet  Commonly known as:  ALDACTONE  Take 1 tablet (25 mg total) by mouth daily.     tobramycin-dexamethasone ophthalmic solution  Commonly known as:  TOBRADEX     traMADol 50 MG tablet  Commonly known as:  ULTRAM  Take 1 tablet (50 mg total) by mouth every 6 (six) hours as needed.     valACYclovir 500 MG tablet  Commonly known as:  VALTREX  Take 1 tablet by mouth as directed.     venlafaxine 75 MG tablet  Commonly known as:  EFFEXOR  Take 75 mg by mouth 2 (two) times daily.     VIGAMOX 0.5 % ophthalmic solution  Generic drug:  moxifloxacin     zolpidem 10 MG tablet  Commonly known as:  AMBIEN  Take 10 mg by mouth at bedtime as needed. For sleep            Past Medical History  Diagnosis Date  . SOB (shortness of breath)   . CAD (coronary artery disease)     Prior LAD stenting with stent thrombosis occurring in a withdrawal phase of her Plavix undertaken because of trigeminal neuralgia and release surgery. It was treated medically.  . Anemia   . CHF (congestive heart failure)   . GERD (gastroesophageal reflux disease)   . Hyperlipidemia   . Osteoarthritis   . Ischemic cardiomyopathy   . Complication of anesthesia     massive heart attack  . Cancer     small bladder cancer removed no  treatment  . Cataract     Past Surgical History  Procedure Laterality Date  . Brain surgery      x3  . Appendectomy    . Joint replacement      thumbs and knees replacement  . Tubal ligation    . Back surgery      x2 back surgeries  . Eye surgery Right 05/24/12  . Bi-ventricular implantable cardioverter defibrillator  (crt-d)  2009; 05/2013    STJ CRTD generator change by Dr Caryl Comes 05/2013  . Biv pacemaker generator change out Bilateral 05/11/2013    Procedure: BIV PACEMAKER GENERATOR CHANGE OUT;  Surgeon: Deboraha Sprang, MD;  Location: [facility] CATH LAB;  Service: Cardiovascular;  Laterality: Bilateral;    Family History  Problem Relation Age of Onset  .  Heart disease Father     Social History:  reports that she has been smoking Cigarettes.  She has a 15 pack-year smoking history. She has never used smokeless tobacco. She reports that she does not drink alcohol or use illicit drugs.  Allergies: No Known Allergies  Review of Systems:  CARDIOLOGY: she has history of coronary artery disease with multiple events and interventions She has had systolic congestive heart failure  History of trigeminal neuralgia in the past   .       No  history of Diabetes.             Examination:   BP 110/70 mmHg  Pulse 64  Temp(Src) 98.5 F (36.9 C) (Oral)  Wt 128 lb 3.2 oz (58.151 kg)   General Appearance: She is thinly built, looks fatigued  Neck: The thyroid is not palpable        Neurological: REFLEXES: at biceps are normal         Assessment   Post ablative hypothyroidism:  She has a normal TSH finally after reducing her dose in 11/2013 down to 100 g Her requirement does not appear to correlate with her weight She has been compliant with her medication  She does continue to have nonspecific symptoms of fatigue and hair loss and discussed that these are related to her thyroid  Advised her to be more compliant with her follow-up as directed She will be seen back in 4 months  Chino Sardo 07/06/2014, 3:02 PM

## 2014-07-18 ENCOUNTER — Other Ambulatory Visit: Payer: Self-pay | Admitting: Family Medicine

## 2014-07-18 DIAGNOSIS — R930 Abnormal findings on diagnostic imaging of skull and head, not elsewhere classified: Secondary | ICD-10-CM

## 2014-07-20 ENCOUNTER — Encounter: Payer: Medicare Other | Admitting: Registered Nurse

## 2014-07-25 ENCOUNTER — Encounter: Payer: Self-pay | Admitting: Registered Nurse

## 2014-07-25 ENCOUNTER — Encounter: Payer: Medicare Other | Attending: Physical Medicine & Rehabilitation | Admitting: Registered Nurse

## 2014-07-25 VITALS — BP 97/59 | HR 64 | Resp 14

## 2014-07-25 DIAGNOSIS — G894 Chronic pain syndrome: Secondary | ICD-10-CM | POA: Diagnosis not present

## 2014-07-25 DIAGNOSIS — F32A Depression, unspecified: Secondary | ICD-10-CM

## 2014-07-25 DIAGNOSIS — G500 Trigeminal neuralgia: Secondary | ICD-10-CM | POA: Insufficient documentation

## 2014-07-25 DIAGNOSIS — Z5181 Encounter for therapeutic drug level monitoring: Secondary | ICD-10-CM | POA: Diagnosis not present

## 2014-07-25 DIAGNOSIS — Z79899 Other long term (current) drug therapy: Secondary | ICD-10-CM | POA: Diagnosis not present

## 2014-07-25 DIAGNOSIS — M172 Bilateral post-traumatic osteoarthritis of knee: Secondary | ICD-10-CM | POA: Insufficient documentation

## 2014-07-25 DIAGNOSIS — F329 Major depressive disorder, single episode, unspecified: Secondary | ICD-10-CM

## 2014-07-25 MED ORDER — OXYCODONE HCL 15 MG PO TABS: 15.0000 mg | ORAL_TABLET | Freq: Four times a day (QID) | ORAL | Status: DC | PRN

## 2014-07-25 NOTE — Progress Notes (Signed)
Subjective:    Patient ID: [name], female    DOB: April 04, 1945, 69 y.o.   MRN: [id number]  HPI: [name] is a 69 year old female who returns for follow up for chronic pain and medication refill. She says her pain is located in her fight temporal pain, elbows and bilateral knees.She rates her pain 7.Her current exercise regime is walking.  She fell on 07/24/2014 she was at the [employer] and trip over something that was on the floor. She landed on her knees. Two pedestrians helped her up. She didn't seek medical attention. She admits to being depressed denies suicidal ideation or plan she's receiving  psychiatric counseling. Her psychiatrist Dr. Delene Ruffini.   Pain Inventory Average Pain 4 Pain Right Now 7 My pain is intermittent, sharp, dull, stabbing and aching  In the last 24 hours, has pain interfered with the following? General activity 7 Relation with others 7 Enjoyment of life 9 What TIME of day is your pain at its worst? morning Sleep (in general) Good  Pain is worse with: walking and standing Pain improves with: rest, heat/ice and medication Relief from Meds: 6  Mobility walk without assistance how many minutes can you walk? 20 ability to climb steps?  yes do you drive?  yes Do you have any goals in this area?  no  Function retired I need assistance with the following:  meal prep, household duties and shopping Do you have any goals in this area?  no  Neuro/Psych weakness confusion depression anxiety loss of taste or smell  Prior Studies Any changes since last visit?  no  Physicians involved in your care Any changes since last visit?  no   Family History  Problem Relation Age of Onset  . Heart disease Father    History   Social History  . Marital Status: Legally Separated    Spouse Name: N/A  . Number of Children: N/A  . Years of Education: N/A   Social History Main Topics  . Smoking status: Current Some Day Smoker -- 0.50  packs/day for 30 years    Types: Cigarettes  . Smokeless tobacco: Never Used  . Alcohol Use: No  . Drug Use: No  . Sexual Activity: No   Other Topics Concern  . None   Social History Narrative   Past Surgical History  Procedure Laterality Date  . Brain surgery      x3  . Appendectomy    . Joint replacement      thumbs and knees replacement  . Tubal ligation    . Back surgery      x2 back surgeries  . Eye surgery Right 05/24/12  . Bi-ventricular implantable cardioverter defibrillator  (crt-d)  2009; 05/2013    STJ CRTD generator change by Dr Caryl Comes 05/2013  . Biv pacemaker generator change out Bilateral 05/11/2013    Procedure: BIV PACEMAKER GENERATOR CHANGE OUT;  Surgeon: Deboraha Sprang, MD;  Location: [facility] CATH LAB;  Service: Cardiovascular;  Laterality: Bilateral;   Past Medical History  Diagnosis Date  . SOB (shortness of breath)   . CAD (coronary artery disease)     Prior LAD stenting with stent thrombosis occurring in a withdrawal phase of her Plavix undertaken because of trigeminal neuralgia and release surgery. It was treated medically.  . Anemia   . CHF (congestive heart failure)   . GERD (gastroesophageal reflux disease)   . Hyperlipidemia   . Osteoarthritis   . Ischemic cardiomyopathy   .  Complication of anesthesia     massive heart attack  . Cancer     small bladder cancer removed no  treatment  . Cataract    BP 97/59 mmHg  Pulse 64  Resp 14  SpO2 98%  Opioid Risk Score:   Fall Risk Score: Moderate Fall Risk (6-13 points)`1  Depression screen PHQ 2/9  Depression screen PHQ 2/9 07/06/2014  Decreased Interest 0  Down, Depressed, Hopeless 0  PHQ - 2 Score 0     Review of Systems  Neurological: Positive for weakness.  Psychiatric/Behavioral: Positive for confusion and dysphoric mood. The patient is nervous/anxious.        Objective:   Physical Exam  Constitutional: She is oriented to person, place, and time. She appears well-developed and  well-nourished.  HENT:  Head: Normocephalic and atraumatic.  Right Temporal Tenderness with Palpation  Neck: Normal range of motion. Neck supple.  Cardiovascular: Normal rate and regular rhythm.   Pulmonary/Chest: Effort normal and breath sounds normal.  Musculoskeletal:  Normal Muscle Bulk and Muscle Testing Reveals: Upper Extremities: Full ROM and Muscle Strength 5/5 Lower Extremities: Full ROM and Muscle Strength 5/5 Arises from chair with ease Narrow Based Gait  Neurological: She is alert and oriented to person, place, and time.  Skin: Skin is warm and dry.  Psychiatric: She has a normal mood and affect.  Nursing note and vitals reviewed.         Assessment & Plan:  1. Right sided trigeminal neuralgia: Refilled: oxyCODONE 15 mg one tablet every 6 hours as needed # 100 tablets.  2. Lumbar postlaminectomy syndrome.: Stable at this time.  3. Osteoarthritis of the knees: Continue with Voltaren gel  4. Morton's Neuroma: F/U with Podiatrist Dr. Paulla Dolly.  15 minutes of face to face patient care time was spent during this visit. All questions were encouraged and answered.   F/U in 1 month

## 2014-07-28 ENCOUNTER — Ambulatory Visit
Admission: RE | Admit: 2014-07-28 | Discharge: 2014-07-28 | Disposition: A | Discharge status: Home / Self Care | Payer: Medicare Other | Source: Ambulatory Visit | Attending: Family Medicine | Admitting: Family Medicine

## 2014-07-28 DIAGNOSIS — R930 Abnormal findings on diagnostic imaging of skull and head, not elsewhere classified: Secondary | ICD-10-CM

## 2014-08-07 ENCOUNTER — Other Ambulatory Visit: Payer: Self-pay | Admitting: Physical Medicine & Rehabilitation

## 2014-08-16 ENCOUNTER — Encounter: Payer: Medicare Other | Admitting: Physical Medicine & Rehabilitation

## 2014-08-18 ENCOUNTER — Other Ambulatory Visit: Payer: Self-pay | Admitting: Physical Medicine & Rehabilitation

## 2014-08-18 ENCOUNTER — Encounter: Payer: Self-pay | Admitting: Physical Medicine & Rehabilitation

## 2014-08-18 ENCOUNTER — Encounter: Payer: Medicare Other | Attending: Physical Medicine & Rehabilitation | Admitting: Physical Medicine & Rehabilitation

## 2014-08-18 VITALS — BP 97/63 | HR 68 | Resp 14

## 2014-08-18 DIAGNOSIS — G894 Chronic pain syndrome: Secondary | ICD-10-CM | POA: Diagnosis not present

## 2014-08-18 DIAGNOSIS — M172 Bilateral post-traumatic osteoarthritis of knee: Secondary | ICD-10-CM | POA: Diagnosis present

## 2014-08-18 DIAGNOSIS — Z5181 Encounter for therapeutic drug level monitoring: Secondary | ICD-10-CM

## 2014-08-18 DIAGNOSIS — G500 Trigeminal neuralgia: Secondary | ICD-10-CM | POA: Diagnosis present

## 2014-08-18 DIAGNOSIS — M174 Other bilateral secondary osteoarthritis of knee: Secondary | ICD-10-CM | POA: Diagnosis not present

## 2014-08-18 DIAGNOSIS — Z79899 Other long term (current) drug therapy: Secondary | ICD-10-CM

## 2014-08-18 DIAGNOSIS — M961 Postlaminectomy syndrome, not elsewhere classified: Secondary | ICD-10-CM

## 2014-08-18 MED ORDER — TRAMADOL HCL 50 MG PO TABS: 50.0000 mg | ORAL_TABLET | Freq: Four times a day (QID) | ORAL | Status: DC | PRN

## 2014-08-18 MED ORDER — DICLOFENAC SODIUM 1 % TD GEL: 1.0000 "application " | Freq: Three times a day (TID) | TRANSDERMAL | Status: DC

## 2014-08-18 MED ORDER — OXYCODONE HCL 20 MG PO TABS: 20.0000 mg | ORAL_TABLET | Freq: Three times a day (TID) | ORAL | Status: DC | PRN

## 2014-08-18 NOTE — Patient Instructions (Signed)
PLEASE CALL ME WITH ANY PROBLEMS OR QUESTIONS (#[phone]).      

## 2014-08-18 NOTE — Progress Notes (Signed)
Subjective:    Patient ID: [name], female    DOB: [birth year], 69 y.o.   MRN: [id number]  HPI   [name] is here in follow up of her chronic pain. She has chronic pain in her let face, hands, left knee and left foot. She states her left foot was "doctored on"---she had some type of small surgery on her toes.   She states that her oxycodone is not lasting long enough. She wants to know if we can change. She continues on tramadol and with her voltaren gel as well.     Pain Inventory Average Pain 8 Pain Right Now 8 My pain is intermittent, sharp, dull, stabbing and aching  In the last 24 hours, has pain interfered with the following? General activity 9 Relation with others 9 Enjoyment of life 9 What TIME of day is your pain at its worst? all Sleep (in general) Good  Pain is worse with: walking, bending and some activites Pain improves with: rest, heat/ice and medication Relief from Meds: 3  Mobility walk without assistance ability to climb steps?  yes do you drive?  yes Do you have any goals in this area?  no  Function retired I need assistance with the following:  household duties and shopping Do you have any goals in this area?  no  Neuro/Psych weakness trouble walking depression loss of taste or smell  Prior Studies Any changes since last visit?  no  Physicians involved in your care Any changes since last visit?  no   Family History  Problem Relation Age of Onset  . Heart disease Father    History   Social History  . Marital Status: Legally Separated    Spouse Name: N/A  . Number of Children: N/A  . Years of Education: N/A   Social History Main Topics  . Smoking status: Current Some Day Smoker -- 0.50 packs/day for 30 years    Types: Cigarettes  . Smokeless tobacco: Never Used  . Alcohol Use: No  . Drug Use: No  . Sexual Activity: No   Other Topics Concern  . None   Social History Narrative   Past Surgical History  Procedure  Laterality Date  . Brain surgery      x3  . Appendectomy    . Joint replacement      thumbs and knees replacement  . Tubal ligation    . Back surgery      x2 back surgeries  . Eye surgery Right 05/24/12  . Bi-ventricular implantable cardioverter defibrillator  (crt-d)  2009; 05/2013    STJ CRTD generator change by Dr Caryl Comes 05/2013  . Biv pacemaker generator change out Bilateral 05/11/2013    Procedure: BIV PACEMAKER GENERATOR CHANGE OUT;  Surgeon: Deboraha Sprang, MD;  Location: [facility] CATH LAB;  Service: Cardiovascular;  Laterality: Bilateral;   Past Medical History  Diagnosis Date  . SOB (shortness of breath)   . CAD (coronary artery disease)     Prior LAD stenting with stent thrombosis occurring in a withdrawal phase of her Plavix undertaken because of trigeminal neuralgia and release surgery. It was treated medically.  . Anemia   . CHF (congestive heart failure)   . GERD (gastroesophageal reflux disease)   . Hyperlipidemia   . Osteoarthritis   . Ischemic cardiomyopathy   . Complication of anesthesia     massive heart attack  . Cancer     small bladder cancer removed no  treatment  . Cataract  BP 97/63 mmHg  Pulse 68  Resp 14  SpO2 97%  Opioid Risk Score:   Fall Risk Score: Moderate Fall Risk (6-13 points)`1  Depression screen PHQ 2/9  Depression screen PHQ 2/9 07/06/2014  Decreased Interest 0  Down, Depressed, Hopeless 0  PHQ - 2 Score 0     Review of Systems  Musculoskeletal: Positive for gait problem.  Neurological: Positive for weakness.  Psychiatric/Behavioral: Positive for dysphoric mood.  All other systems reviewed and are negative.      Objective:   Physical Exam  Constitutional: generally alert. slow HENT:  Head: Normocephalic and atraumatic.  Eyes: right eye appears irritated, lateral aspect sutured together  Neck: Normal range of motion. Neck supple.  Cardiovascular: Normal rate and regular rhythm.  Pulmonary/Chest: Effort normal. No respiratory  distress.  Abdominal: She exhibits no distension.  Neurological: Balance better. Continued facial hypersensitivity on the right. Strength near 5/5. Sensation grossly in tact in the limbs except for below the knees where her fine touch is decreased.  Musculoskeletal: Lumbar spine tender with hyperextension and side bending, especially on the right. She had difficulty moving to neutral from a flexed position. Hands and toes are generally tender to touch and show chronic arthritic signs/ sclerosis. She has pain with palpation between the 3rd and 4th MT's. 3rd MT also tender to palaption., pain also increases with weight bearing.  Psych: . Mood is cooperative.    Assessment & Plan:   ASSESSMENT:  1. Right-sided trigeminal neuralgia.  2. Lumbar postlaminectomy syndrome.  3. Osteoarthritis of the knees.  4. Cardiomyopathy.  5. Weight loss  6. Peripheral neuropathy, risk factor for falls.  7. ?morton's neuroma left foot, OA of both feet    PLAN:  1. Will change oxycodone 20mg  q8 prn. #90 (decreased from #100 of the 15mg  tabs) 2. Trileptal---continue 300mg  BID in addition to the gabapentin.  3. Refill rx for voltaren gel was written to help her hand and knee pain and foot pain. Needs to use QID to left foot.  4. Discussed follow up with podiatry for her left foot. 5. My PA will see her back in about a month. 30 minutes of face to face patient care time were spent during this visit. All questions were encouraged and answered.

## 2014-08-19 LAB — PMP ALCOHOL METABOLITE (ETG): Ethyl Glucuronide (EtG): NEGATIVE ng/mL

## 2014-08-24 LAB — BENZODIAZEPINES (GC/LC/MS), URINE
Alprazolam metabolite (GC/LC/MS), ur confirm: NEGATIVE ng/mL (ref <25)
Clonazepam metabolite (GC/LC/MS), ur confirm: NEGATIVE ng/mL (ref <25)
Flurazepam metabolite (GC/LC/MS), ur confirm: NEGATIVE ng/mL (ref <50)
LORAZEPAMU: NEGATIVE ng/mL (ref <50)
MIDAZOLAMU: NEGATIVE ng/mL (ref <50)
Nordiazepam (GC/LC/MS), ur confirm: NEGATIVE ng/mL (ref <50)
Oxazepam (GC/LC/MS), ur confirm: 2180 ng/mL — AB (ref <50)
TEMAZEPAMU: NEGATIVE ng/mL (ref <50)
Triazolam metabolite (GC/LC/MS), ur confirm: NEGATIVE ng/mL (ref <50)

## 2014-08-24 LAB — OPIATES/OPIOIDS (LC/MS-MS)
CODEINE URINE: NEGATIVE ng/mL (ref <50)
HYDROMORPHONE: NEGATIVE ng/mL (ref <50)
Hydrocodone: NEGATIVE ng/mL (ref <50)
Morphine Urine: NEGATIVE ng/mL (ref <50)
NOROXYCODONE, UR: 9638 ng/mL (ref <50)
Norhydrocodone, Ur: NEGATIVE ng/mL (ref <50)
Oxycodone, ur: 1600 ng/mL (ref <50)
Oxymorphone: 140 ng/mL (ref <50)

## 2014-08-24 LAB — OXYCODONE, URINE (LC/MS-MS)
NOROXYCODONE, UR: 9638 ng/mL (ref <50)
Oxycodone, ur: 1600 ng/mL (ref <50)
Oxymorphone: 140 ng/mL (ref <50)

## 2014-08-24 LAB — ZOLPIDEM (LC/MS-MS), URINE
Zolpidem (GC/LC/MS), Ur confirm: NEGATIVE ng/mL (ref <5)
Zolpidem metabolite (GC/LC/MS) Ur, confirm: 237 ng/mL — AB (ref <5)

## 2014-08-25 LAB — PRESCRIPTION MONITORING PROFILE (SOLSTAS)
AMPHETAMINE/METH: NEGATIVE ng/mL
BARBITURATE SCREEN, URINE: NEGATIVE ng/mL
Buprenorphine, Urine: NEGATIVE ng/mL
CARISOPRODOL, URINE: NEGATIVE ng/mL
COCAINE METABOLITES: NEGATIVE ng/mL
Cannabinoid Scrn, Ur: NEGATIVE ng/mL
Creatinine, Urine: 130.68 mg/dL (ref >20.0)
ECSTASY: NEGATIVE ng/mL
Fentanyl, Ur: NEGATIVE ng/mL
MEPERIDINE UR: NEGATIVE ng/mL
Methadone Screen, Urine: NEGATIVE ng/mL
Nitrites, Initial: NEGATIVE ug/mL
PH URINE, INITIAL: 8 pH (ref 4.5–8.9)
Propoxyphene: NEGATIVE ng/mL
TAPENTADOLUR: NEGATIVE ng/mL
Tramadol Scrn, Ur: NEGATIVE ng/mL

## 2014-08-30 NOTE — Progress Notes (Signed)
Urine drug screen for this encounter is consistent for prescribed medication 

## 2014-09-20 ENCOUNTER — Encounter: Payer: Medicare Other | Attending: Physical Medicine & Rehabilitation | Admitting: Registered Nurse

## 2014-09-20 DIAGNOSIS — M172 Bilateral post-traumatic osteoarthritis of knee: Secondary | ICD-10-CM | POA: Insufficient documentation

## 2014-09-20 DIAGNOSIS — G500 Trigeminal neuralgia: Secondary | ICD-10-CM | POA: Insufficient documentation

## 2014-09-25 ENCOUNTER — Other Ambulatory Visit: Payer: Self-pay | Admitting: Cardiology

## 2014-09-25 ENCOUNTER — Encounter: Payer: Self-pay | Admitting: Registered Nurse

## 2014-09-25 ENCOUNTER — Encounter (HOSPITAL_BASED_OUTPATIENT_CLINIC_OR_DEPARTMENT_OTHER): Payer: Medicare Other | Admitting: Registered Nurse

## 2014-09-25 VITALS — BP 110/62 | HR 68 | Resp 14

## 2014-09-25 DIAGNOSIS — Z5181 Encounter for therapeutic drug level monitoring: Secondary | ICD-10-CM | POA: Diagnosis not present

## 2014-09-25 DIAGNOSIS — M172 Bilateral post-traumatic osteoarthritis of knee: Secondary | ICD-10-CM | POA: Diagnosis not present

## 2014-09-25 DIAGNOSIS — G500 Trigeminal neuralgia: Secondary | ICD-10-CM | POA: Diagnosis present

## 2014-09-25 DIAGNOSIS — G894 Chronic pain syndrome: Secondary | ICD-10-CM | POA: Diagnosis not present

## 2014-09-25 DIAGNOSIS — M25561 Pain in right knee: Secondary | ICD-10-CM | POA: Diagnosis not present

## 2014-09-25 DIAGNOSIS — R2681 Unsteadiness on feet: Secondary | ICD-10-CM

## 2014-09-25 DIAGNOSIS — Z79899 Other long term (current) drug therapy: Secondary | ICD-10-CM

## 2014-09-25 DIAGNOSIS — M174 Other bilateral secondary osteoarthritis of knee: Secondary | ICD-10-CM

## 2014-09-25 DIAGNOSIS — M25562 Pain in left knee: Secondary | ICD-10-CM

## 2014-09-25 MED ORDER — OXYCODONE HCL 20 MG PO TABS: 20.0000 mg | ORAL_TABLET | Freq: Three times a day (TID) | ORAL | Status: DC | PRN

## 2014-09-25 NOTE — Progress Notes (Signed)
Subjective:    Patient ID: [name], female    DOB: [birth year], 69 y.o.   MRN: [id number]  HPI: [name] is a 69 year old female who returns for follow up for chronic pain and medication refill. She says her pain is located in her right temporal pain, bilateral knees and lower extremities. She rates her pain 8.Her current exercise regime is walking in her home. [name] was demanding and increase in her medication to every 4 hours stating the pain is unbearable. Dr. Naaman Plummer increased her oxycodone to 20 mg every 8 hours last month this was reiterated to [name] she verbalizes understanding. Also asked if she would like to go to [facility] for evaluation she denies. Her Ex-husband in room with her. [name] missed her scheduled appointment due to a migraine she ran out of her medication. Also educated on keeping her scheduled appointments if possible, if their is a problem for her to call the office and we will try to accommodate her. She verbalizes understanding. Also states her gait is unsteady.  I asked her to walk with me in the office hall, upon assessment she is unsteady holding onto the wall's. She's not using her cane educated on using her cane for safety and placed a referral to neurology, she verbalizes understanding.    Pain Inventory Average Pain 7 Pain Right Now 8 My pain is constant, sharp, stabbing and aching  In the last 24 hours, has pain interfered with the following? General activity 8 Relation with others 8 Enjoyment of life 9 What TIME of day is your pain at its worst? ALL Sleep (in general) Fair  Pain is worse with: walking, bending, sitting, inactivity, standing, unsure and some activites Pain improves with: rest and medication Relief from Meds: 2  Mobility walk with assistance how many minutes can you walk? 2 ability to climb steps?  no do you drive?  no  Function retired  Neuro/Psych weakness trouble  walking dizziness confusion  Prior Studies Any changes since last visit?  no  Physicians involved in your care Any changes since last visit?  no   Family History  Problem Relation Age of Onset  . Heart disease Father    History   Social History  . Marital Status: Legally Separated    Spouse Name: N/A  . Number of Children: N/A  . Years of Education: N/A   Social History Main Topics  . Smoking status: Current Some Day Smoker -- 0.50 packs/day for 30 years    Types: Cigarettes  . Smokeless tobacco: Never Used  . Alcohol Use: No  . Drug Use: No  . Sexual Activity: No   Other Topics Concern  . None   Social History Narrative   Past Surgical History  Procedure Laterality Date  . Brain surgery      x3  . Appendectomy    . Joint replacement      thumbs and knees replacement  . Tubal ligation    . Back surgery      x2 back surgeries  . Eye surgery Right 05/24/12  . Bi-ventricular implantable cardioverter defibrillator  (crt-d)  2009; 05/2013    STJ CRTD generator change by Dr Caryl Comes 05/2013  . Biv pacemaker generator change out Bilateral 05/11/2013    Procedure: BIV PACEMAKER GENERATOR CHANGE OUT;  Surgeon: Deboraha Sprang, MD;  Location: [facility] CATH LAB;  Service: Cardiovascular;  Laterality: Bilateral;   Past Medical History  Diagnosis  Date  . SOB (shortness of breath)   . CAD (coronary artery disease)     Prior LAD stenting with stent thrombosis occurring in a withdrawal phase of her Plavix undertaken because of trigeminal neuralgia and release surgery. It was treated medically.  . Anemia   . CHF (congestive heart failure)   . GERD (gastroesophageal reflux disease)   . Hyperlipidemia   . Osteoarthritis   . Ischemic cardiomyopathy   . Complication of anesthesia     massive heart attack  . Cancer     small bladder cancer removed no  treatment  . Cataract    BP 110/62 mmHg  Pulse 68  Resp 14  SpO2 99%  Opioid Risk Score:   Fall Risk Score: Low Fall Risk (0-5  points)`1  Depression screen PHQ 2/9  Depression screen PHQ 2/9 07/06/2014  Decreased Interest 0  Down, Depressed, Hopeless 0  PHQ - 2 Score 0    Review of Systems  Constitutional: Positive for unexpected weight change.       Weight loss  Eyes: Negative.   Respiratory: Positive for shortness of breath.   Cardiovascular: Negative.   Gastrointestinal: Positive for nausea, vomiting and diarrhea.  Endocrine: Negative.   Genitourinary: Negative.   Musculoskeletal: Positive for myalgias, back pain and arthralgias.  Skin: Negative.   Allergic/Immunologic: Negative.   Neurological: Positive for dizziness, weakness and numbness.       Trouble walking  Hematological: Negative.   Psychiatric/Behavioral: Positive for confusion. The patient is nervous/anxious.        Objective:   Physical Exam  Constitutional: She is oriented to person, place, and time. She appears well-developed and well-nourished.  HENT:  Head: Normocephalic and atraumatic.  Neck: Normal range of motion. Neck supple.  Cardiovascular: Normal rate and regular rhythm.   Pulmonary/Chest: Effort normal and breath sounds normal.  Musculoskeletal:  Normal Muscle Bulk and Muscle Testing Reveals: Upper Extremities: Full ROM and Muscle Strength 5/5 Lower Extremities: Full ROM and Muscle Strength 5/5 Arises from chair slowly Unsteady Gait    Neurological: She is alert and oriented to person, place, and time.  Skin: Skin is warm and dry.  Psychiatric: She has a normal mood and affect.  Nursing note and vitals reviewed.         Assessment & Plan:  1. Right sided trigeminal neuralgia: Refilled: oxyCODONE 20 mg one tablet every 6 hours as needed # 100 tablets.  2. Lumbar postlaminectomy syndrome.: Stable at this time.  3. Osteoarthritis of the knees: Continue with Voltaren gel  4. Morton's Neuroma: No complaints: F/U with Podiatrist Dr. Paulla Dolly. 4. Unsteady Gait:RX: Referral to Neurology  25 minutes of face to face  patient care time was spent during this visit. All questions were encouraged and answered.   F/U in 1 month

## 2014-09-27 NOTE — Telephone Encounter (Signed)
Per note 1.27.16

## 2014-09-29 ENCOUNTER — Other Ambulatory Visit: Payer: Self-pay | Admitting: *Deleted

## 2014-09-29 MED ORDER — POTASSIUM CHLORIDE CRYS ER 20 MEQ PO TBCR: 20.0000 meq | EXTENDED_RELEASE_TABLET | Freq: Two times a day (BID) | ORAL | Status: DC

## 2014-10-03 ENCOUNTER — Other Ambulatory Visit: Payer: Self-pay | Admitting: Cardiology

## 2014-10-05 ENCOUNTER — Other Ambulatory Visit: Payer: Self-pay | Admitting: Cardiology

## 2014-10-26 ENCOUNTER — Other Ambulatory Visit: Payer: Self-pay | Admitting: Cardiology

## 2014-10-26 ENCOUNTER — Encounter: Payer: Self-pay | Admitting: Registered Nurse

## 2014-10-26 ENCOUNTER — Encounter: Payer: Medicare Other | Attending: Physical Medicine & Rehabilitation | Admitting: Registered Nurse

## 2014-10-26 VITALS — BP 104/66 | HR 62 | Resp 14

## 2014-10-26 DIAGNOSIS — M172 Bilateral post-traumatic osteoarthritis of knee: Secondary | ICD-10-CM | POA: Insufficient documentation

## 2014-10-26 DIAGNOSIS — G500 Trigeminal neuralgia: Secondary | ICD-10-CM | POA: Diagnosis not present

## 2014-10-26 DIAGNOSIS — M174 Other bilateral secondary osteoarthritis of knee: Secondary | ICD-10-CM

## 2014-10-26 DIAGNOSIS — G894 Chronic pain syndrome: Secondary | ICD-10-CM

## 2014-10-26 DIAGNOSIS — Z5181 Encounter for therapeutic drug level monitoring: Secondary | ICD-10-CM | POA: Diagnosis not present

## 2014-10-26 DIAGNOSIS — M25561 Pain in right knee: Secondary | ICD-10-CM | POA: Diagnosis not present

## 2014-10-26 DIAGNOSIS — M25562 Pain in left knee: Secondary | ICD-10-CM

## 2014-10-26 DIAGNOSIS — Z79899 Other long term (current) drug therapy: Secondary | ICD-10-CM

## 2014-10-26 MED ORDER — OXYCODONE HCL 20 MG PO TABS: 20.0000 mg | ORAL_TABLET | Freq: Three times a day (TID) | ORAL | Status: DC | PRN

## 2014-10-26 NOTE — Progress Notes (Signed)
Subjective:    Patient ID: [name], female    DOB: April 05, 1945, 69 y.o.   MRN: [id number]  HPI: [name] is a 69 year old female who returns for follow up for chronic pain and medication refill. She says her pain is located in her right temporal pain, bilateral knees and lower extremities. She rates her pain 5.Her current exercise regime is walking in her home.  Pain Inventory Average Pain 4 Pain Right Now 5 My pain is constant, dull, tingling and aching  In the last 24 hours, has pain interfered with the following? General activity 5 Relation with others 6 Enjoyment of life 7 What TIME of day is your pain at its worst? ALL Sleep (in general) Good  Pain is worse with: walking, sitting, standing and some activites Pain improves with: medication Relief from Meds: 5  Mobility walk without assistance how many minutes can you walk? 5 ability to climb steps?  no do you drive?  no  Function retired  Neuro/Psych weakness tingling trouble walking spasms dizziness anxiety  Prior Studies Any changes since last visit?  no  Physicians involved in your care Any changes since last visit?  no   Family History  Problem Relation Age of Onset  . Heart disease Father    History   Social History  . Marital Status: Legally Separated    Spouse Name: N/A  . Number of Children: N/A  . Years of Education: N/A   Social History Main Topics  . Smoking status: Current Some Day Smoker -- 0.50 packs/day for 30 years    Types: Cigarettes  . Smokeless tobacco: Never Used  . Alcohol Use: No  . Drug Use: No  . Sexual Activity: No   Other Topics Concern  . None   Social History Narrative   Past Surgical History  Procedure Laterality Date  . Brain surgery      x3  . Appendectomy    . Joint replacement      thumbs and knees replacement  . Tubal ligation    . Back surgery      x2 back surgeries  . Eye surgery Right 05/24/12  . Bi-ventricular implantable  cardioverter defibrillator  (crt-d)  2009; 05/2013    STJ CRTD generator change by Dr Caryl Comes 05/2013  . Biv pacemaker generator change out Bilateral 05/11/2013    Procedure: BIV PACEMAKER GENERATOR CHANGE OUT;  Surgeon: Deboraha Sprang, MD;  Location: [facility] CATH LAB;  Service: Cardiovascular;  Laterality: Bilateral;   Past Medical History  Diagnosis Date  . SOB (shortness of breath)   . CAD (coronary artery disease)     Prior LAD stenting with stent thrombosis occurring in a withdrawal phase of her Plavix undertaken because of trigeminal neuralgia and release surgery. It was treated medically.  . Anemia   . CHF (congestive heart failure)   . GERD (gastroesophageal reflux disease)   . Hyperlipidemia   . Osteoarthritis   . Ischemic cardiomyopathy   . Complication of anesthesia     massive heart attack  . Cancer     small bladder cancer removed no  treatment  . Cataract    BP 104/66 mmHg  Pulse 62  Resp 14  SpO2 94%  Opioid Risk Score:   Fall Risk Score:  `1  Depression screen PHQ 2/9  Depression screen PHQ 2/9 07/06/2014  Decreased Interest 0  Down, Depressed, Hopeless 0  PHQ - 2 Score 0     Review  of Systems  HENT: Negative.   Eyes: Negative.   Respiratory: Positive for shortness of breath and wheezing.   Cardiovascular: Negative.        Chest pains sometimes  Gastrointestinal: Negative.   Endocrine: Negative.   Genitourinary: Negative.   Musculoskeletal: Positive for myalgias, back pain and arthralgias.  Skin: Negative.   Neurological: Positive for dizziness and weakness.       Trouble walking, spasms  Hematological: Negative.   Psychiatric/Behavioral: Positive for confusion, dysphoric mood and decreased concentration.       Objective:   Physical Exam  Constitutional: She is oriented to person, place, and time. She appears well-developed and well-nourished.  HENT:  Head: Normocephalic and atraumatic.  Neck: Normal range of motion. Neck supple.  Cardiovascular:  Normal rate and regular rhythm.   Pulmonary/Chest: Effort normal and breath sounds normal.  Musculoskeletal:  Normal Muscle Bulk and Muscle Testing Reveals: Upper Extremities: Full ROM and Muscle Strength 5/5 Lower Extremities: Full ROM and Muscle Strength 5/5 Arises from chair with ease Narrow Based Gait  Neurological: She is alert and oriented to person, place, and time.  Skin: Skin is warm and dry.  Psychiatric: She has a normal mood and affect.  Nursing note and vitals reviewed.         Assessment & Plan:  1. Right sided trigeminal neuralgia: Refilled: oxyCODONE 20 mg one tablet every 8 hours as needed # 90 tablets.  2. Lumbar postlaminectomy syndrome.: Stable at this time.  3. Osteoarthritis of the knees: Continue with Voltaren gel  4. Morton's Neuroma: No complaints: F/U with Podiatrist Dr. Paulla Dolly.   25 minutes of face to face patient care time was spent during this visit. All questions were encouraged and answered.

## 2014-11-01 ENCOUNTER — Other Ambulatory Visit (INDEPENDENT_AMBULATORY_CARE_PROVIDER_SITE_OTHER): Payer: Medicare Other

## 2014-11-01 DIAGNOSIS — E890 Postprocedural hypothyroidism: Secondary | ICD-10-CM | POA: Diagnosis not present

## 2014-11-01 LAB — T4, FREE: Free T4: 1.26 ng/dL (ref 0.60–1.60)

## 2014-11-01 LAB — TSH: TSH: 0.56 u[IU]/mL (ref 0.35–4.50)

## 2014-11-06 ENCOUNTER — Encounter: Payer: Self-pay | Admitting: Endocrinology

## 2014-11-06 ENCOUNTER — Ambulatory Visit (INDEPENDENT_AMBULATORY_CARE_PROVIDER_SITE_OTHER): Payer: Medicare Other | Admitting: Endocrinology

## 2014-11-06 VITALS — BP 102/62 | HR 70 | Temp 98.3°F | Resp 14 | Ht 62.0 in | Wt 117.6 lb

## 2014-11-06 DIAGNOSIS — E890 Postprocedural hypothyroidism: Secondary | ICD-10-CM

## 2014-11-06 NOTE — Progress Notes (Signed)
Patient ID: [name], female   DOB: 08-20-1945, 69 y.o.   MRN: [id number]    Reason for Appointment:  Post ablative hypothyroidism, followup  History of Present Illness:    She became hypothyroid in 03/2012 following radioactive iodine treatment for hyperthyroidism in 11/13  She was initially started with 75 mcg of levothyroxine but with her TSH being nearly 50 in 3/14 the dose was increased to 137 mcg Subsequently has required relatively lower dose and since  7/15 had been taking 112 mcg  In 11/2013 because of her TSH being significant suppressed her dose was reduced to 100 g  Subsequently apparently her TSH level has been normal  Usually she tends to have chronic tiredness regardless of her thyroid levels  She does not know why she is losing weight Not complaining of as much hair loss lately Does not complain of heat or cold intolerance   She is quite compliant with taking her medication daily, usually takes it after breakfast for better tolerability Does not take any other medications except gabapentin with this and no nutritional supplements or calcium   Wt Readings from Last 3 Encounters:  11/06/14 117 lb 9.6 oz (53.343 kg)  07/06/14 128 lb 3.2 oz (58.151 kg)  04/26/14 138 lb (62.596 kg)   Labs:  Lab Results  Component Value Date   TSH 0.56 11/01/2014   TSH 3.07 07/03/2014   TSH 0.05* 12/02/2013   FREET4 1.26 11/01/2014   FREET4 0.91 07/03/2014   FREET4 1.04 12/02/2013         Medication List       This list is accurate as of: 11/06/14  9:45 PM.  Always use your most recent med list.               artificial tears ointment  Place into the right eye as needed.     aspirin 325 MG EC tablet  Take 325 mg by mouth daily.     bacitracin ophthalmic ointment     carvedilol 6.25 MG tablet  Commonly known as:  COREG  TAKE 1 TABLET BY MOUTH 2 TIMES DAILY WITH A MEAL     clorazepate 7.5 MG tablet  Commonly known as:  TRANXENE     cyclobenzaprine 5 MG  tablet  Commonly known as:  FLEXERIL  TAKE 1 TABLET BY MOUTH 3 TIMES A DAY AS NEEDED FOR MUSCLE SPASMS     diclofenac sodium 1 % Gel  Commonly known as:  VOLTAREN  Apply 1 application topically 3 (three) times daily. Apply to knees and hands     furosemide 80 MG tablet  Commonly known as:  LASIX  TAKE 1 TABLET (80 MG TOTAL) BY MOUTH 2 (TWO) TIMES DAILY.     gabapentin 800 MG tablet  Commonly known as:  NEURONTIN  Take 800 mg by mouth 4 (four) times daily as needed. For pain     iron polysaccharides 150 MG capsule  Commonly known as:  NIFEREX  Take 150 mg by mouth 2 (two) times daily.     levothyroxine 100 MCG tablet  Commonly known as:  SYNTHROID, LEVOTHROID  Take 1 tablet (100 mcg total) by mouth daily.     methocarbamol 500 MG tablet  Commonly known as:  ROBAXIN  Take 1 tablet (500 mg total) by mouth 4 (four) times daily.     NITROSTAT 0.4 MG SL tablet  Generic drug:  nitroGLYCERIN  TAKE 1 TABLET AS DIRECTED SUBLINGUALLY AS DIRECTED FOR CHEST PAIN  Oxcarbazepine 300 MG tablet  Commonly known as:  TRILEPTAL  TAKE 1 TABLET (300 MG TOTAL) BY MOUTH 2 (TWO) TIMES DAILY.     Oxycodone HCl 20 MG Tabs  Take 1 tablet (20 mg total) by mouth every 8 (eight) hours as needed.     potassium chloride SA 20 MEQ tablet  Commonly known as:  KLOR-CON M20  Take 1 tablet (20 mEq total) by mouth 2 (two) times daily.     prednisoLONE acetate 1 % ophthalmic suspension  Commonly known as:  PRED FORTE     promethazine 25 MG tablet  Commonly known as:  PHENERGAN  Take 1 tablet (25 mg total) by mouth every 6 (six) hours as needed. for nasuea     ramipril 2.5 MG capsule  Commonly known as:  ALTACE  TAKE 1 CAPSULE (2.5 MG TOTAL) BY MOUTH DAILY. DAILY     REFRESH TEARS 0.5 % Soln  Generic drug:  carboxymethylcellulose  1 drop.     simvastatin 10 MG tablet  Commonly known as:  ZOCOR  TAKE 1 TABLET BY MOUTH EVERY DAY     sodium chloride 5 % ophthalmic ointment  Commonly known as:   MURO 010  Place 1 application into the right eye.     spironolactone 25 MG tablet  Commonly known as:  ALDACTONE  Take 1 tablet (25 mg total) by mouth daily.     tobramycin-dexamethasone ophthalmic solution  Commonly known as:  TOBRADEX     traMADol 50 MG tablet  Commonly known as:  ULTRAM  Take 1 tablet (50 mg total) by mouth every 6 (six) hours as needed.     valACYclovir 500 MG tablet  Commonly known as:  VALTREX  Take 1 tablet by mouth as directed.     venlafaxine XR 150 MG 24 hr capsule  Commonly known as:  EFFEXOR-XR     VIGAMOX 0.5 % ophthalmic solution  Generic drug:  moxifloxacin     zolpidem 10 MG tablet  Commonly known as:  AMBIEN  Take 10 mg by mouth at bedtime as needed. For sleep            Past Medical History  Diagnosis Date  . SOB (shortness of breath)   . CAD (coronary artery disease)     Prior LAD stenting with stent thrombosis occurring in a withdrawal phase of her Plavix undertaken because of trigeminal neuralgia and release surgery. It was treated medically.  . Anemia   . CHF (congestive heart failure)   . GERD (gastroesophageal reflux disease)   . Hyperlipidemia   . Osteoarthritis   . Ischemic cardiomyopathy   . Complication of anesthesia     massive heart attack  . Cancer     small bladder cancer removed no  treatment  . Cataract     Past Surgical History  Procedure Laterality Date  . Brain surgery      x3  . Appendectomy    . Joint replacement      thumbs and knees replacement  . Tubal ligation    . Back surgery      x2 back surgeries  . Eye surgery Right 05/24/12  . Bi-ventricular implantable cardioverter defibrillator  (crt-d)  2009; 05/2013    STJ CRTD generator change by Dr Caryl Comes 05/2013  . Biv pacemaker generator change out Bilateral 05/11/2013    Procedure: BIV PACEMAKER GENERATOR CHANGE OUT;  Surgeon: Deboraha Sprang, MD;  Location: [facility] CATH LAB;  Service: Cardiovascular;  Laterality: Bilateral;  Family History  Problem  Relation Age of Onset  . Heart disease Father     Social History:  reports that she has been smoking Cigarettes.  She has a 15 pack-year smoking history. She has never used smokeless tobacco. She reports that she does not drink alcohol or use illicit drugs.  Allergies: No Known Allergies  Review of Systems:  She has had progressive weight loss, has not discussed this with her PCP  CARDIOLOGY: she has history of coronary artery disease with multiple events and interventions She has had systolic congestive heart failure  History of trigeminal neuralgia in the past   .              she thinks she has difficulty with memory   Examination:   BP 102/62 mmHg  Pulse 70  Temp(Src) 98.3 F (36.8 C)  Resp 14  Ht 5\' 2"  (1.575 m)  Wt 117 lb 9.6 oz (53.343 kg)  BMI 21.50 kg/m2  SpO2 97%   General Appearance: She is thinly built, alert  Neck: The thyroid is not palpable        Neurological: REFLEXES: at biceps are normal   No tremor        Assessment   Post ablative hypothyroidism:  She has a normal TSH although on the low side compared to her previous levels She has been on  same dose of 100 g levothyroxine for about a year now Her requirement does not appear to correlate with her weight She has been compliant with her medication  She does continue to have nonspecific symptoms of fatigue   However unexplained weight loss needs to be evaluated by PCP and discuss with her and her husband today She will be seen back in 3-4 months  Antaniya Venuti 11/06/2014, 9:45 PM

## 2014-11-06 NOTE — Patient Instructions (Signed)
Take 1 dose daily but on SUNDAYS take 1/2 thyroid pill  See Dr Kenton Kingfisher for weight loss

## 2014-11-09 ENCOUNTER — Telehealth: Payer: Self-pay | Admitting: Endocrinology

## 2014-11-09 NOTE — Telephone Encounter (Signed)
Noted  

## 2014-11-09 NOTE — Telephone Encounter (Signed)
She should ask them to see her for urgent office visit for weight loss

## 2014-11-09 NOTE — Telephone Encounter (Signed)
Noted, patient is aware. 

## 2014-11-09 NOTE — Telephone Encounter (Signed)
Please see below and advise.

## 2014-11-09 NOTE — Telephone Encounter (Signed)
Patient stated that Dr Dwyane Dee wanted her to make and appt for weight loss, the earliest appointment was Tue @ 10:00  With Dr Kathlen Brunswick

## 2014-11-09 NOTE — Telephone Encounter (Signed)
Pt cannot get an appt with PCP for a CPE until November the 8th is this ok?

## 2014-11-14 ENCOUNTER — Other Ambulatory Visit: Payer: Self-pay | Admitting: Family Medicine

## 2014-11-14 ENCOUNTER — Ambulatory Visit
Admission: RE | Admit: 2014-11-14 | Discharge: 2014-11-14 | Disposition: A | Discharge status: Home / Self Care | Payer: Medicare Other | Source: Ambulatory Visit | Attending: Family Medicine | Admitting: Family Medicine

## 2014-11-14 DIAGNOSIS — R634 Abnormal weight loss: Secondary | ICD-10-CM

## 2014-11-14 DIAGNOSIS — F17200 Nicotine dependence, unspecified, uncomplicated: Secondary | ICD-10-CM

## 2014-11-22 ENCOUNTER — Encounter: Payer: Medicare Other | Attending: Physical Medicine & Rehabilitation | Admitting: Registered Nurse

## 2014-11-22 VITALS — BP 95/51 | HR 70 | Resp 14

## 2014-11-22 DIAGNOSIS — Z79899 Other long term (current) drug therapy: Secondary | ICD-10-CM

## 2014-11-22 DIAGNOSIS — M172 Bilateral post-traumatic osteoarthritis of knee: Secondary | ICD-10-CM | POA: Insufficient documentation

## 2014-11-22 DIAGNOSIS — Z5181 Encounter for therapeutic drug level monitoring: Secondary | ICD-10-CM

## 2014-11-22 DIAGNOSIS — G500 Trigeminal neuralgia: Secondary | ICD-10-CM | POA: Diagnosis not present

## 2014-11-22 DIAGNOSIS — G894 Chronic pain syndrome: Secondary | ICD-10-CM | POA: Diagnosis not present

## 2014-11-22 DIAGNOSIS — M174 Other bilateral secondary osteoarthritis of knee: Secondary | ICD-10-CM

## 2014-11-22 MED ORDER — OXYCODONE HCL 20 MG PO TABS: 20.0000 mg | ORAL_TABLET | Freq: Three times a day (TID) | ORAL | Status: DC | PRN

## 2014-11-22 NOTE — Progress Notes (Signed)
Subjective:    Patient ID: [name], female    DOB: 03-24-1946, 69 y.o.   MRN: [id number]  HPI:[name] is a 69 year old female who returns for follow up for chronic pain and medication refill. She says her pain is located in her right temporal pain. She rates her pain 3.Her current exercise regime is walking in her home. [name] remains with an unsteady gait using the walls to support her. A neurology consult was placed she refused the appointment due to financial hardship. She was encouraged to ask family for financial assistance.  Also states she has not fallen, Also states she has had diarrhea for the last three days she will follow up with her PCP. Her ex-husband in room he helps her around the house and transports  her to her doctor appointments.  Pain Inventory Average Pain 5 Pain Right Now 3 My pain is intermittent, sharp, burning, dull and stabbing  In the last 24 hours, has pain interfered with the following? General activity 6 Relation with others 8 Enjoyment of life 8 What TIME of day is your pain at its worst? all Sleep (in general) Good  Pain is worse with: walking, bending and standing Pain improves with: rest, heat/ice and medication Relief from Meds: 5  Mobility Do you have any goals in this area?  no  Function Do you have any goals in this area?  no  Neuro/Psych bladder control problems weakness depression loss of taste or smell  Prior Studies Any changes since last visit?  no  Physicians involved in your care Any changes since last visit?  no   Family History  Problem Relation Age of Onset  . Heart disease Father    Social History   Social History  . Marital Status: Legally Separated    Spouse Name: N/A  . Number of Children: N/A  . Years of Education: N/A   Social History Main Topics  . Smoking status: Current Some Day Smoker -- 0.50 packs/day for 30 years    Types: Cigarettes  . Smokeless tobacco: Never Used  .  Alcohol Use: No  . Drug Use: No  . Sexual Activity: No   Other Topics Concern  . None   Social History Narrative   Past Surgical History  Procedure Laterality Date  . Brain surgery      x3  . Appendectomy    . Joint replacement      thumbs and knees replacement  . Tubal ligation    . Back surgery      x2 back surgeries  . Eye surgery Right 05/24/12  . Bi-ventricular implantable cardioverter defibrillator  (crt-d)  2009; 05/2013    STJ CRTD generator change by Dr Caryl Comes 05/2013  . Biv pacemaker generator change out Bilateral 05/11/2013    Procedure: BIV PACEMAKER GENERATOR CHANGE OUT;  Surgeon: Deboraha Sprang, MD;  Location: [facility] CATH LAB;  Service: Cardiovascular;  Laterality: Bilateral;   Past Medical History  Diagnosis Date  . SOB (shortness of breath)   . CAD (coronary artery disease)     Prior LAD stenting with stent thrombosis occurring in a withdrawal phase of her Plavix undertaken because of trigeminal neuralgia and release surgery. It was treated medically.  . Anemia   . CHF (congestive heart failure)   . GERD (gastroesophageal reflux disease)   . Hyperlipidemia   . Osteoarthritis   . Ischemic cardiomyopathy   . Complication of anesthesia     massive  heart attack  . Cancer     small bladder cancer removed no  treatment  . Cataract    BP 95/51 mmHg  Pulse 70  Resp 14  SpO2 97%  Opioid Risk Score:   Fall Risk Score:  `1  Depression screen PHQ 2/9  Depression screen PHQ 2/9 07/06/2014  Decreased Interest 0  Down, Depressed, Hopeless 0  PHQ - 2 Score 0     Review of Systems  Constitutional:       Loss of taste/smell  Neurological: Positive for weakness.  Psychiatric/Behavioral: Positive for dysphoric mood.  All other systems reviewed and are negative.      Objective:   Physical Exam  Constitutional: She is oriented to person, place, and time. She appears well-developed and well-nourished.  HENT:  Head: Normocephalic and atraumatic.  Neck: Normal  range of motion. Neck supple.  Cardiovascular: Normal rate and regular rhythm.   Pulmonary/Chest: Effort normal and breath sounds normal.  Musculoskeletal:  Normal Muscle Bulk and Muscle Testing Reveals: Upper Extremities: Full ROM and Muscle Strength 5/5 Lower Extremities: Full ROM and Muscle Strength 5/5 Arises from chair with ease Unsteady Gait/ Using [employer] with walking/ Ex- Husband assisting   Neurological: She is alert and oriented to person, place, and time.  + Romberg Unsteady Gait  Skin: Skin is warm and dry.  Psychiatric: She has a normal mood and affect.  Nursing note and vitals reviewed.         Assessment & Plan:  1. Right sided trigeminal neuralgia: Refilled: oxyCODONE 20 mg one tablet every 8 hours as needed # 90 tablets.  2. Lumbar postlaminectomy syndrome.: Stable at this time.  3. Osteoarthritis of the knees: Continue with Voltaren gel  4. Morton's Neuroma: No complaints: F/U with Podiatrist Dr. Paulla Dolly.  25 minutes of face to face patient care time was spent during this visit. All questions were encouraged and answered.

## 2014-11-23 ENCOUNTER — Encounter: Payer: Self-pay | Admitting: Cardiology

## 2014-11-23 ENCOUNTER — Ambulatory Visit (INDEPENDENT_AMBULATORY_CARE_PROVIDER_SITE_OTHER): Payer: Medicare Other | Admitting: Cardiology

## 2014-11-23 VITALS — BP 86/44 | HR 66 | Ht 62.0 in | Wt 114.0 lb

## 2014-11-23 DIAGNOSIS — Z95810 Presence of automatic (implantable) cardiac defibrillator: Secondary | ICD-10-CM | POA: Diagnosis not present

## 2014-11-23 DIAGNOSIS — I255 Ischemic cardiomyopathy: Secondary | ICD-10-CM | POA: Diagnosis not present

## 2014-11-23 DIAGNOSIS — I2583 Coronary atherosclerosis due to lipid rich plaque: Secondary | ICD-10-CM

## 2014-11-23 DIAGNOSIS — J438 Other emphysema: Secondary | ICD-10-CM

## 2014-11-23 DIAGNOSIS — I10 Essential (primary) hypertension: Secondary | ICD-10-CM

## 2014-11-23 DIAGNOSIS — I2510 Atherosclerotic heart disease of native coronary artery without angina pectoris: Secondary | ICD-10-CM

## 2014-11-23 MED ORDER — FUROSEMIDE 80 MG PO TABS: 80.0000 mg | ORAL_TABLET | Freq: Every day | ORAL | Status: DC

## 2014-11-23 MED ORDER — CARVEDILOL 3.125 MG PO TABS
3.1250 mg | ORAL_TABLET | Freq: Two times a day (BID) | ORAL | Status: DC
Start: 2014-11-23 — End: 2015-06-20

## 2014-11-23 MED ORDER — POTASSIUM CHLORIDE CRYS ER 20 MEQ PO TBCR: 20.0000 meq | EXTENDED_RELEASE_TABLET | Freq: Every day | ORAL | Status: DC

## 2014-11-23 MED ORDER — SPIRONOLACTONE 25 MG PO TABS: 12.5000 mg | ORAL_TABLET | Freq: Every day | ORAL | Status: DC

## 2014-11-23 NOTE — Patient Instructions (Addendum)
Medication Instructions:  Your physician has recommended you make the following change in your medication:  1) REDUCE Spironolactone to 12.5mg  (1/2 tablet) daily 2) REDUCE Carvedilol to 3.125mg  (1/2 tablet) twice daily 3) REDUCE Lasix to 80mg  daily 4) REDUCE Potassium to 69meq daily  Labwork: None ordered  Testing/Procedures: None ordered  Follow-Up: Your physician recommends that you schedule a follow-up appointment in: 2 weeks with Dr.Skains or an APP   Any Other Special Instructions Will Be Listed Below (If Applicable).

## 2014-11-23 NOTE — Progress Notes (Signed)
[facility]. [address], [address], [place]  [address] Phone: [phone] Fax:  [phone]  Date:  11/23/2014   ID:  [name], DOB 1945/05/15, MRN [id number]  PCP:  Shirline Frees, MD   History of Present Illness: [name] is a 69 y.o. female with ischemic cardiomyopathy ejection fraction in the 15-20% range, Ischemic cardiomyopathy status post Bi-V pacemaker, coronary artery disease with bare-metal stent implantation for in-stent restenosis here for followup. Dr. Caryl Comes who provided her with AV optimization of her device. She was told that her device at baseline was fairly synchronized.  She is battled with long-standing depression. Overall her breathing is improved.   In 2012, we had a long discussion about her cardiomyopathy and overall prognosis she has done remarkably well since then.  Sometimes she can feel shortness of breath when walking through her house. She is still smoking. Her blood pressure is quite low in the 80s. Sometimes she is feeling somewhat dizzy, tired. Changes made. No chest pain.    Wt Readings from Last 3 Encounters:  11/23/14 114 lb (51.71 kg)  11/06/14 117 lb 9.6 oz (53.343 kg)  07/06/14 128 lb 3.2 oz (58.151 kg)     Past Medical History  Diagnosis Date  . SOB (shortness of breath)   . CAD (coronary artery disease)     Prior LAD stenting with stent thrombosis occurring in a withdrawal phase of her Plavix undertaken because of trigeminal neuralgia and release surgery. It was treated medically.  . Anemia   . CHF (congestive heart failure)   . GERD (gastroesophageal reflux disease)   . Hyperlipidemia   . Osteoarthritis   . Ischemic cardiomyopathy   . Complication of anesthesia     massive heart attack  . Cancer     small bladder cancer removed no  treatment  . Cataract     Past Surgical History  Procedure Laterality Date  . Brain surgery      x3  . Appendectomy    . Joint replacement      thumbs and knees replacement   . Tubal ligation    . Back surgery      x2 back surgeries  . Eye surgery Right 05/24/12  . Bi-ventricular implantable cardioverter defibrillator  (crt-d)  2009; 05/2013    STJ CRTD generator change by Dr Caryl Comes 05/2013  . Biv pacemaker generator change out Bilateral 05/11/2013    Procedure: BIV PACEMAKER GENERATOR CHANGE OUT;  Surgeon: Deboraha Sprang, MD;  Location: [facility] CATH LAB;  Service: Cardiovascular;  Laterality: Bilateral;    Current Outpatient Prescriptions  Medication Sig Dispense Refill  . Artificial Tear Ointment (ARTIFICIAL TEARS) ointment Place into the right eye as needed.    Marland Kitchen aspirin 325 MG EC tablet Take 325 mg by mouth daily.    . bacitracin ophthalmic ointment     . carboxymethylcellulose (REFRESH TEARS) 0.5 % SOLN 1 drop.    . carvedilol (COREG) 6.25 MG tablet TAKE 1 TABLET BY MOUTH 2 TIMES DAILY WITH A MEAL 60 tablet 6  . clorazepate (TRANXENE) 7.5 MG tablet   4  . cyclobenzaprine (FLEXERIL) 5 MG tablet TAKE 1 TABLET BY MOUTH 3 TIMES A DAY AS NEEDED FOR MUSCLE SPASMS  0  . diclofenac sodium (VOLTAREN) 1 % GEL Apply 1 application topically 3 (three) times daily. Apply to knees and hands 5 Tube 3  . furosemide (LASIX) 80 MG tablet TAKE 1 TABLET (80 MG TOTAL) BY MOUTH 2 (  TWO) TIMES DAILY. 60 tablet 0  . gabapentin (NEURONTIN) 800 MG tablet Take 800 mg by mouth 4 (four) times daily as needed. For pain    . iron polysaccharides (NIFEREX) 150 MG capsule Take 150 mg by mouth 2 (two) times daily.    Marland Kitchen levothyroxine (SYNTHROID, LEVOTHROID) 100 MCG tablet Take 1 tablet (100 mcg total) by mouth daily. 90 tablet 3  . methocarbamol (ROBAXIN) 500 MG tablet Take 1 tablet (500 mg total) by mouth 4 (four) times daily. 120 tablet 2  . NITROSTAT 0.4 MG SL tablet TAKE 1 TABLET AS DIRECTED SUBLINGUALLY AS DIRECTED FOR CHEST PAIN 25 tablet 1  . Oxcarbazepine (TRILEPTAL) 300 MG tablet TAKE 1 TABLET (300 MG TOTAL) BY MOUTH 2 (TWO) TIMES DAILY. 60 tablet 3  . Oxycodone HCl 20 MG TABS Take 1 tablet  (20 mg total) by mouth every 8 (eight) hours as needed. 90 tablet 0  . potassium chloride SA (KLOR-CON M20) 20 MEQ tablet Take 1 tablet (20 mEq total) by mouth 2 (two) times daily. 60 tablet 1  . prednisoLONE acetate (PRED FORTE) 1 % ophthalmic suspension     . promethazine (PHENERGAN) 25 MG tablet Take 1 tablet (25 mg total) by mouth every 6 (six) hours as needed. for nasuea 90 tablet 1  . ramipril (ALTACE) 2.5 MG capsule TAKE 1 CAPSULE (2.5 MG TOTAL) BY MOUTH DAILY. DAILY 90 capsule 1  . simvastatin (ZOCOR) 10 MG tablet TAKE 1 TABLET BY MOUTH EVERY DAY 30 tablet 2  . sodium chloride (MURO 128) 5 % ophthalmic ointment Place 1 application into the right eye.    . spironolactone (ALDACTONE) 25 MG tablet Take 1 tablet (25 mg total) by mouth daily. 30 tablet 11  . tobramycin-dexamethasone (TOBRADEX) ophthalmic solution     . traMADol (ULTRAM) 50 MG tablet Take 1 tablet (50 mg total) by mouth every 6 (six) hours as needed. 60 tablet 3  . valACYclovir (VALTREX) 500 MG tablet Take 1 tablet by mouth as directed.    . venlafaxine XR (EFFEXOR-XR) 150 MG 24 hr capsule   4  . VIGAMOX 0.5 % ophthalmic solution     . zolpidem (AMBIEN) 10 MG tablet Take 10 mg by mouth at bedtime as needed. For sleep     No current facility-administered medications for this visit.    Allergies:   No Known Allergies  Social History:  The patient  reports that she has been smoking Cigarettes.  She has a 15 pack-year smoking history. She has never used smokeless tobacco. She reports that she does not drink alcohol or use illicit drugs.   ROS:  Please see the history of present illness.   Positive for depression, cardiomyopathy. No syncope, no fevers, no chest pain, no significant shortness of breath    PHYSICAL EXAM: VS:  BP 86/44 mmHg  Pulse 66  Ht 5\' 2"  (1.575 m)  Wt 114 lb (51.71 kg)  BMI 20.85 kg/m2  SpO2 98% Well nourished, well developed, in no acute distress HEENT: normal. Corner sewn right eye Neck: no  JVD Cardiac:  normal S1, S2; RRR; no murmur Lungs:  clear to auscultation bilaterally, no wheezing, rhonchi or rales Abd: soft, nontender, no hepatomegaly Ext: no edema Skin: warm and dry Neuro: no focal abnormalities noted  EKG:  04/26/14 - Ventricular pacing, sinus rhythm, 87.     ASSESSMENT AND PLAN:  1. Chronic systolic heart failure-currently well compensated. Ejection fraction of 20% range.  NYHA class II symptoms. BP in the 80s.  Review of prior encounters show fairly low blood pressures as well. Because of this, I will decrease some of her medications. We will decrease carvedilol from 6.25 twice a day to 3.125 twice a day. Decrease spironolactone from 25 once a day to 12.5 once a day. Decrease furosemide from 80 mg twice a day to 80 mg once a day and decrease potassium to once a day.  We will see her back in approximately 2 weeks to evaluate this change. Her pain medications as well could cause low blood pressure. 2. Biventricular ICD-fairly well synchronized according to review of Dr. Olin Pia note. 3. Hypertension-well-controlled. At times, slightly hypotensive. This may exacerbate dizziness. We have cut back her medications. 4. Tobacco use-encouraged cessation.  5. 2 week follow up.  Signed, Candee Furbish, MD [facility]  11/23/2014 9:33 AM

## 2014-12-07 ENCOUNTER — Ambulatory Visit: Payer: Medicare Other | Admitting: Cardiology

## 2014-12-15 ENCOUNTER — Ambulatory Visit: Payer: Medicare Other | Admitting: Registered Nurse

## 2014-12-16 ENCOUNTER — Other Ambulatory Visit: Payer: Self-pay | Admitting: Cardiology

## 2014-12-18 NOTE — Telephone Encounter (Signed)
Please advise on refill as I do not see a recent lipid panel. Thanks, MI

## 2014-12-18 NOTE — Telephone Encounter (Signed)
OK to refill but she needs a Lipid and ALT if she has not had them by her PCP.

## 2014-12-19 NOTE — Telephone Encounter (Signed)
Tried to reach patient but was unsuccessful, will put note on rx that she needs lipids checked as she has an appointment this month with Richardson Dopp.

## 2014-12-25 ENCOUNTER — Telehealth: Payer: Self-pay | Admitting: Physical Medicine & Rehabilitation

## 2014-12-25 NOTE — Telephone Encounter (Signed)
Patient had called and left voicemail on clinic line that she needed her medication.  I called her back to let her know that she would need to be seen to get her medication.  I told her Zella Ball has an opening today and she could see her.  She stated she does not have money to come and could we leave a prescription up front.  I again explained to her that she would need to be seen to get medication and asked her did she want to keep her appointment on Friday 9/30 and she said she guessed so and the "shit" would hit the fan when she came in.

## 2014-12-27 ENCOUNTER — Ambulatory Visit: Payer: Medicare Other | Admitting: Physician Assistant

## 2014-12-27 NOTE — Telephone Encounter (Signed)
I did not see that April had called [name] about an appointment, so I called her today to offer her an appointment with Zella Ball at 1:30.  I had to leave voicemail.

## 2014-12-29 ENCOUNTER — Encounter: Payer: Self-pay | Admitting: Registered Nurse

## 2014-12-29 ENCOUNTER — Encounter: Payer: Medicare Other | Attending: Physical Medicine & Rehabilitation | Admitting: Registered Nurse

## 2014-12-29 ENCOUNTER — Other Ambulatory Visit: Payer: Self-pay | Admitting: Physical Medicine & Rehabilitation

## 2014-12-29 VITALS — BP 116/62 | HR 97 | Resp 14

## 2014-12-29 DIAGNOSIS — G894 Chronic pain syndrome: Secondary | ICD-10-CM

## 2014-12-29 DIAGNOSIS — Z79899 Other long term (current) drug therapy: Secondary | ICD-10-CM

## 2014-12-29 DIAGNOSIS — Z5181 Encounter for therapeutic drug level monitoring: Secondary | ICD-10-CM | POA: Diagnosis not present

## 2014-12-29 DIAGNOSIS — M172 Bilateral post-traumatic osteoarthritis of knee: Secondary | ICD-10-CM | POA: Diagnosis present

## 2014-12-29 DIAGNOSIS — M174 Other bilateral secondary osteoarthritis of knee: Secondary | ICD-10-CM

## 2014-12-29 DIAGNOSIS — G500 Trigeminal neuralgia: Secondary | ICD-10-CM

## 2014-12-29 MED ORDER — OXYCODONE HCL 20 MG PO TABS: 20.0000 mg | ORAL_TABLET | Freq: Three times a day (TID) | ORAL | Status: DC | PRN

## 2014-12-29 NOTE — Progress Notes (Signed)
Subjective:    Patient ID: [name], female    DOB: 13-Jun-1945, 69 y.o.   MRN: [id number]  HPI: [name] is a 69 year old female who returns for follow up for chronic pain and medication refill. She says her pain is located in her right temporal pain. Also complaining of joint pain. [name] states the Oxycodone is wearing off in five hours, she asked if I could increase her medication to every five hours. I explained her medication was increased a few months ago, she states no one understand the pain I'm in. I was in contact with Dr. Naaman Plummer and he agrees with the plan. No medication will be increased she verbalizes understanding.She rates her pain 10.Her current exercise regime is walking in her home. [name] remains with an unsteady gait using the walls to support her. Her ex-husband in room he helps her around the house and transports her to her doctor appointments. [name] was instructed we need a UDS she states" I'm going home", I explained if she  leaves the office without  obtaining a UDS she would be discharge from the office. She verbalizes understanding. [name] provided the urine sample and states her sister gave her a hydrocodone today, she has been out of her oxycodone for 4-5 days. Educated on medication compliance and adhering to prescriptions as written she verbalizes understanding.  Pain Inventory Average Pain 10 Pain Right Now 10 My pain is constant, sharp, burning, dull, stabbing and aching  In the last 24 hours, has pain interfered with the following? General activity 10 Relation with others 10 Enjoyment of life 10 What TIME of day is your pain at its worst? all Sleep (in general) Fair  Pain is worse with: no selection Pain improves with: no selection Relief from Meds: 0 - ran out of meds  Mobility walk with assistance use a walker how many minutes can you walk? 2 Do you have any goals in this area?  no  Function Do you have any goals  in this area?  no  Neuro/Psych No problems in this area  Prior Studies Any changes since last visit?  no  Physicians involved in your care Any changes since last visit?  no   Family History  Problem Relation Age of Onset  . Heart disease Father    Social History   Social History  . Marital Status: Legally Separated    Spouse Name: N/A  . Number of Children: N/A  . Years of Education: N/A   Social History Main Topics  . Smoking status: Current Some Day Smoker -- 0.50 packs/day for 30 years    Types: Cigarettes  . Smokeless tobacco: Never Used  . Alcohol Use: No  . Drug Use: No  . Sexual Activity: No   Other Topics Concern  . None   Social History Narrative   Past Surgical History  Procedure Laterality Date  . Brain surgery      x3  . Appendectomy    . Joint replacement      thumbs and knees replacement  . Tubal ligation    . Back surgery      x2 back surgeries  . Eye surgery Right 05/24/12  . Bi-ventricular implantable cardioverter defibrillator  (crt-d)  2009; 05/2013    STJ CRTD generator change by Dr Caryl Comes 05/2013  . Biv pacemaker generator change out Bilateral 05/11/2013    Procedure: BIV PACEMAKER GENERATOR CHANGE OUT;  Surgeon: Deboraha Sprang,  MD;  Location: [facility] CATH LAB;  Service: Cardiovascular;  Laterality: Bilateral;   Past Medical History  Diagnosis Date  . SOB (shortness of breath)   . CAD (coronary artery disease)     Prior LAD stenting with stent thrombosis occurring in a withdrawal phase of her Plavix undertaken because of trigeminal neuralgia and release surgery. It was treated medically.  . Anemia   . CHF (congestive heart failure)   . GERD (gastroesophageal reflux disease)   . Hyperlipidemia   . Osteoarthritis   . Ischemic cardiomyopathy   . Complication of anesthesia     massive heart attack  . Cancer     small bladder cancer removed no  treatment  . Cataract    BP 116/62 mmHg  Pulse 97  Resp 14  SpO2 98%  Opioid Risk Score:     Fall Risk Score:  `1  Depression screen PHQ 2/9  Depression screen PHQ 2/9 07/06/2014  Decreased Interest 0  Down, Depressed, Hopeless 0  PHQ - 2 Score 0     Review of Systems  All other systems reviewed and are negative.      Objective:   Physical Exam  Constitutional: She is oriented to person, place, and time. She appears well-developed and well-nourished.  HENT:  Head: Normocephalic and atraumatic.  Right Temporal Hypersensitivity  Neck: Normal range of motion. Neck supple.  Cardiovascular: Normal rate and regular rhythm.   Pulmonary/Chest: Effort normal and breath sounds normal.  Musculoskeletal:  Normal Muscle Bulk and Muscle Testing Reveals: Upper Extremities: Full ROM and Muscle Strength 5/5 Lower Extremities: Full ROM and Muscle Strength 5/5 Arises from Table slowly/ Unsteady Gait Holds unto ex-husband   Neurological: She is alert and oriented to person, place, and time.  Skin: Skin is warm and dry.  Psychiatric: She has a normal mood and affect.  Nursing note and vitals reviewed.         Assessment & Plan:  1. Right sided trigeminal neuralgia: Refilled: oxyCODONE 20 mg one tablet every 8 hours as needed # 90 tablets.  2. Lumbar postlaminectomy syndrome.: Stable at this time.  3. Osteoarthritis of the knees: Continue with Voltaren gel  4. Morton's Neuroma: No complaints: F/U with Podiatrist Dr. Paulla Dolly.  40 minutes of face to face patient care time was spent during this visit. All questions were encouraged and answered

## 2014-12-30 LAB — PMP ALCOHOL METABOLITE (ETG): ETGU: NEGATIVE ng/mL

## 2015-01-04 ENCOUNTER — Other Ambulatory Visit: Payer: Self-pay | Admitting: Cardiology

## 2015-01-04 ENCOUNTER — Other Ambulatory Visit: Payer: Self-pay | Admitting: Physical Medicine & Rehabilitation

## 2015-01-04 LAB — BENZODIAZEPINES (GC/LC/MS), URINE
ALPRAZOLAMU: NEGATIVE ng/mL (ref <25)
Clonazepam metabolite (GC/LC/MS), ur confirm: NEGATIVE ng/mL (ref <25)
FLURAZEPAMU: NEGATIVE ng/mL (ref <50)
LORAZEPAMU: NEGATIVE ng/mL (ref <50)
Midazolam (GC/LC/MS), ur confirm: NEGATIVE ng/mL (ref <50)
Nordiazepam (GC/LC/MS), ur confirm: NEGATIVE ng/mL (ref <50)
OXAZEPAMU: 859 ng/mL — AB (ref <50)
TEMAZEPAMU: NEGATIVE ng/mL (ref <50)
Triazolam metabolite (GC/LC/MS), ur confirm: NEGATIVE ng/mL (ref <50)

## 2015-01-04 LAB — OPIATES/OPIOIDS (LC/MS-MS)
Codeine Urine: NEGATIVE ng/mL (ref <50)
Hydrocodone: 595 ng/mL (ref <50)
Hydromorphone: 161 ng/mL (ref <50)
Morphine Urine: NEGATIVE ng/mL (ref <50)
Norhydrocodone, Ur: 1822 ng/mL (ref <50)
Noroxycodone, Ur: NEGATIVE ng/mL — AB (ref <50)
OXYCODONE, UR: NEGATIVE ng/mL — AB (ref <50)
OXYMORPHONE, URINE: NEGATIVE ng/mL — AB (ref <50)

## 2015-01-04 LAB — ZOLPIDEM (LC/MS-MS), URINE
ZOLPIDEM METABOLITE (GC/LS/MS) UR, CONFIRM: 604 ng/mL — AB (ref <5)
Zolpidem (GC/LC/MS), Ur confirm: 40 ng/mL — AB (ref <5)

## 2015-01-05 LAB — PRESCRIPTION MONITORING PROFILE (SOLSTAS)
AMPHETAMINE/METH: NEGATIVE ng/mL
BUPRENORPHINE, URINE: NEGATIVE ng/mL
Barbiturate Screen, Urine: NEGATIVE ng/mL
CARISOPRODOL, URINE: NEGATIVE ng/mL
Cannabinoid Scrn, Ur: NEGATIVE ng/mL
Cocaine Metabolites: NEGATIVE ng/mL
Creatinine, Urine: 30.72 mg/dL (ref >20.0)
ECSTASY: NEGATIVE ng/mL
FENTANYL URINE: NEGATIVE ng/mL
MEPERIDINE UR: NEGATIVE ng/mL
Methadone Screen, Urine: NEGATIVE ng/mL
NITRITES URINE, INITIAL: NEGATIVE ug/mL
Oxycodone Screen, Ur: NEGATIVE ng/mL
PROPOXYPHENE: NEGATIVE ng/mL
Tapentadol, urine: NEGATIVE ng/mL
Tramadol Scrn, Ur: NEGATIVE ng/mL
pH, Initial: 5.2 pH (ref 4.5–8.9)

## 2015-01-10 NOTE — Progress Notes (Signed)
Urine drug screen for this encounter is inconsistent for prescribed medication. [name]'s last fill was 11/24/14 and she would have been out of meds by 12/23/14 and was unable to come to her appt until 12/29/14.  She reported  That she had to take someone's hydrocodone

## 2015-01-24 ENCOUNTER — Encounter: Payer: Medicare Other | Admitting: Registered Nurse

## 2015-01-27 ENCOUNTER — Other Ambulatory Visit: Payer: Self-pay | Admitting: Endocrinology

## 2015-01-29 ENCOUNTER — Encounter: Payer: Self-pay | Admitting: Physical Medicine & Rehabilitation

## 2015-01-29 ENCOUNTER — Encounter: Payer: Medicare Other | Attending: Physical Medicine & Rehabilitation | Admitting: Physical Medicine & Rehabilitation

## 2015-01-29 VITALS — BP 118/78 | HR 67

## 2015-01-29 DIAGNOSIS — M174 Other bilateral secondary osteoarthritis of knee: Secondary | ICD-10-CM | POA: Diagnosis not present

## 2015-01-29 DIAGNOSIS — Z5181 Encounter for therapeutic drug level monitoring: Secondary | ICD-10-CM

## 2015-01-29 DIAGNOSIS — M172 Bilateral post-traumatic osteoarthritis of knee: Secondary | ICD-10-CM | POA: Insufficient documentation

## 2015-01-29 DIAGNOSIS — G500 Trigeminal neuralgia: Secondary | ICD-10-CM | POA: Diagnosis present

## 2015-01-29 DIAGNOSIS — G894 Chronic pain syndrome: Secondary | ICD-10-CM | POA: Diagnosis not present

## 2015-01-29 DIAGNOSIS — Z79899 Other long term (current) drug therapy: Secondary | ICD-10-CM | POA: Diagnosis not present

## 2015-01-29 MED ORDER — OXYCODONE HCL 20 MG PO TABS: 20.0000 mg | ORAL_TABLET | Freq: Three times a day (TID) | ORAL | Status: DC | PRN

## 2015-01-29 NOTE — Progress Notes (Signed)
Subjective:    Patient ID: [name], female    DOB: 18-May-1945, 69 y.o.   MRN: [id number]  HPI  [name] is here in follow up of her chronic pain. She is getting over a GI virus which caused a lot of diarrhea. Her right eye became irritated by her cat who brushed its tail up against her injured eye/eye lid.   She has had more pain in her hands, knees, and feet. She uses asper cream to help with the pain there.   She doesn't feel that the oxycodone really helps all that much any more. Tramadol doesn't seem to help either.   Pain Inventory Average Pain 8 Pain Right Now 9 My pain is intermittent, constant, sharp, burning, dull, stabbing and aching  In the last 24 hours, has pain interfered with the following? General activity 10 Relation with others 10 Enjoyment of life 10 What TIME of day is your pain at its worst? Anytime Awake Sleep (in general) Fair  Pain is worse with: NA Pain improves with: heat/ice and medication Relief from Meds: 4  Mobility Do you have any goals in this area?  no  Function Do you have any goals in this area?  no  Neuro/Psych No problems in this area  Prior Studies Any changes since last visit?  no  Physicians involved in your care Any changes since last visit?  no   Family History  Problem Relation Age of Onset  . Heart disease Father    Social History   Social History  . Marital Status: Legally Separated    Spouse Name: N/A  . Number of Children: N/A  . Years of Education: N/A   Social History Main Topics  . Smoking status: Current Some Day Smoker -- 0.50 packs/day for 30 years    Types: Cigarettes  . Smokeless tobacco: Never Used  . Alcohol Use: No  . Drug Use: No  . Sexual Activity: No   Other Topics Concern  . None   Social History Narrative   Past Surgical History  Procedure Laterality Date  . Brain surgery      x3  . Appendectomy    . Joint replacement      thumbs and knees replacement  . Tubal ligation     . Back surgery      x2 back surgeries  . Eye surgery Right 05/24/12  . Bi-ventricular implantable cardioverter defibrillator  (crt-d)  2009; 05/2013    STJ CRTD generator change by Dr Caryl Comes 05/2013  . Biv pacemaker generator change out Bilateral 05/11/2013    Procedure: BIV PACEMAKER GENERATOR CHANGE OUT;  Surgeon: Deboraha Sprang, MD;  Location: [facility] CATH LAB;  Service: Cardiovascular;  Laterality: Bilateral;   Past Medical History  Diagnosis Date  . SOB (shortness of breath)   . CAD (coronary artery disease)     Prior LAD stenting with stent thrombosis occurring in a withdrawal phase of her Plavix undertaken because of trigeminal neuralgia and release surgery. It was treated medically.  . Anemia   . CHF (congestive heart failure) ([place])   . GERD (gastroesophageal reflux disease)   . Hyperlipidemia   . Osteoarthritis   . Ischemic cardiomyopathy   . Complication of anesthesia     massive heart attack  . Cancer ([place])     small bladder cancer removed no  treatment  . Cataract    BP 118/78 mmHg  Pulse 67  SpO2 100%  Opioid Risk Score:   Fall Risk  Score:  `1  Depression screen PHQ 2/9  Depression screen PHQ 2/9 07/06/2014  Decreased Interest 0  Down, Depressed, Hopeless 0  PHQ - 2 Score 0     Review of Systems  Constitutional: Positive for unexpected weight change.  Gastrointestinal: Positive for nausea, vomiting and diarrhea.  All other systems reviewed and are negative.      Objective:   Physical Exam  Constitutional: generally alert. slow  HENT:  Head: Normocephalic and atraumatic.  Eyes: right eye appears irritated, lateral aspect sutured together  Neck: Normal range of motion. Neck supple.  Cardiovascular: Normal rate and regular rhythm.  Pulmonary/Chest: Effort normal. No respiratory distress.  Abdominal: She exhibits no distension.  Neurological: Balance better. Continued facial hypersensitivity on the right. Strength near 5/5. Sensation grossly in tact in the  limbs except for below the knees where her fine touch is decreased.  Musculoskeletal: Lumbar spine tender with hyperextension and side bending, especially on the right. She had difficulty moving to neutral from a flexed position. Hands and toes are generally tender to touch and show chronic arthritic signs/ sclerosis. She has pain with palpation between the 3rd and 4th MT's. 3rd MT also tender to palaption., pain also increases with weight bearing.  Psych: . Mood is cooperative.   Assessment & Plan:   ASSESSMENT:  1. Right-sided trigeminal neuralgia.  2. Lumbar postlaminectomy syndrome.  3. Osteoarthritis of the knees.  4. Cardiomyopathy.  5. Weight loss  6. Peripheral neuropathy, risk factor for falls.  7. ?morton's neuroma left foot, OA of both feet   PLAN:  1. continue oxycodone 20mg  q8 prn. #90   2. Trileptal---continue 300mg  BID in addition to the gabapentin.  3. Would like her to continue with the voltaren gel. Will see if we can get her a copay assist card 4. F/u at [facility] regarding new options for her trigeminal neuralgia.  5. My PA will see her back in about a month. 30 minutes of face to face patient care time were spent during this visit. All questions were encouraged and answered.

## 2015-02-06 ENCOUNTER — Other Ambulatory Visit: Payer: Self-pay | Admitting: Cardiology

## 2015-02-07 ENCOUNTER — Other Ambulatory Visit: Payer: Self-pay | Admitting: Family Medicine

## 2015-02-07 DIAGNOSIS — Z1231 Encounter for screening mammogram for malignant neoplasm of breast: Secondary | ICD-10-CM

## 2015-02-09 ENCOUNTER — Other Ambulatory Visit: Payer: Self-pay | Admitting: Cardiology

## 2015-02-14 ENCOUNTER — Telehealth: Payer: Self-pay | Admitting: Physical Medicine & Rehabilitation

## 2015-02-14 NOTE — Telephone Encounter (Signed)
PT WAS TOLD THAT SHE DOES HAVE AN UPCOMING APPT WITH [facility] AND COULD SWITCH TO DR [id number] ON THE SAME DAY BECAUSE THAT WAS HIS FIRST AVAILABLE APPT - SHE SAID FOR A TEN MINUTE CONSULTATION!!!!!  AND SHE WAS TOLD THAT SHE WOULD HAVE TO SET UP A REGULAR APPT TIME BECAUSE HE HAS FULL PATIENT LOAD.  SHE WOULD LIKE [facility] TO TALK TO HER OVER THE PHONE OR SEE HER BEFORE THE END OF THE MONTH.  PT WANTED TO KEEP HER APPT WITH [facility] FOR HER MED REFILLS. PLEASE CALL PTS TO FIND OUT WHAT TYPE OF SURGERY THIS IS AND WHAT LETTER SHE WAS SENT IN THE MAIL THAT SHE WANTS DR [id number] TO READ PT HAS BEEN PLACED ON THE CANCELLATIONS LIST IN CASE AN OPENING WITH [id number] COME AVAILABLE

## 2015-02-16 NOTE — Telephone Encounter (Signed)
done

## 2015-02-16 NOTE — Telephone Encounter (Signed)
Please move to [facility]. Thank you

## 2015-02-19 ENCOUNTER — Ambulatory Visit: Payer: Medicare Other | Admitting: Physical Medicine & Rehabilitation

## 2015-02-24 ENCOUNTER — Other Ambulatory Visit: Payer: Self-pay | Admitting: Cardiology

## 2015-02-28 ENCOUNTER — Encounter: Payer: Medicare Other | Admitting: Physical Medicine & Rehabilitation

## 2015-02-28 ENCOUNTER — Encounter: Payer: Medicare Other | Attending: Physical Medicine & Rehabilitation | Admitting: Registered Nurse

## 2015-02-28 ENCOUNTER — Encounter: Payer: Self-pay | Admitting: Registered Nurse

## 2015-02-28 VITALS — BP 105/61 | HR 66 | Resp 14

## 2015-02-28 DIAGNOSIS — M25562 Pain in left knee: Secondary | ICD-10-CM

## 2015-02-28 DIAGNOSIS — M25561 Pain in right knee: Secondary | ICD-10-CM | POA: Diagnosis not present

## 2015-02-28 DIAGNOSIS — Z79899 Other long term (current) drug therapy: Secondary | ICD-10-CM

## 2015-02-28 DIAGNOSIS — G894 Chronic pain syndrome: Secondary | ICD-10-CM

## 2015-02-28 DIAGNOSIS — G500 Trigeminal neuralgia: Secondary | ICD-10-CM | POA: Diagnosis present

## 2015-02-28 DIAGNOSIS — Z5181 Encounter for therapeutic drug level monitoring: Secondary | ICD-10-CM

## 2015-02-28 DIAGNOSIS — M174 Other bilateral secondary osteoarthritis of knee: Secondary | ICD-10-CM

## 2015-02-28 DIAGNOSIS — M172 Bilateral post-traumatic osteoarthritis of knee: Secondary | ICD-10-CM | POA: Diagnosis not present

## 2015-02-28 MED ORDER — OXYCODONE HCL 20 MG PO TABS: 20.0000 mg | ORAL_TABLET | Freq: Three times a day (TID) | ORAL | Status: DC | PRN

## 2015-02-28 NOTE — Progress Notes (Signed)
Subjective:    Patient ID: [name], female    DOB: [birth year], 69 y.o.   MRN: [id number]  HPI: [name] is a 69 year old female who returns for follow up for chronic pain and medication refill. She says her pain is located in her right temporal pain, left foot and bilateral knees. She rates her pain 3.Her current exercise regime is walking in her home. [name] has an appointment with Dr. Rosine Door 03/01/2015 at [facility] regarding new options for her Trigeminal neuralgia.  Pain Inventory Average Pain 7 Pain Right Now 3 My pain is intermittent, sharp and stabbing  In the last 24 hours, has pain interfered with the following? General activity 9 Relation with others 9 Enjoyment of life 9 What TIME of day is your pain at its worst? na Sleep (in general) Good  Pain is worse with: walking and bending Pain improves with: medication Relief from Meds: 5  Mobility walk without assistance ability to climb steps?  yes do you drive?  yes Do you have any goals in this area?  no  Function retired I need assistance with the following:  household duties and shopping Do you have any goals in this area?  no  Neuro/Psych weakness depression loss of taste or smell  Prior Studies Any changes since last visit?  no  Physicians involved in your care Any changes since last visit?  no   Family History  Problem Relation Age of Onset  . Heart disease Father    Social History   Social History  . Marital Status: Legally Separated    Spouse Name: N/A  . Number of Children: N/A  . Years of Education: N/A   Social History Main Topics  . Smoking status: Current Some Day Smoker -- 0.50 packs/day for 30 years    Types: Cigarettes  . Smokeless tobacco: Never Used  . Alcohol Use: No  . Drug Use: No  . Sexual Activity: No   Other Topics Concern  . None   Social History Narrative   Past Surgical History  Procedure Laterality Date  . Brain surgery      x3  .  Appendectomy    . Joint replacement      thumbs and knees replacement  . Tubal ligation    . Back surgery      x2 back surgeries  . Eye surgery Right 05/24/12  . Bi-ventricular implantable cardioverter defibrillator  (crt-d)  2009; 05/2013    STJ CRTD generator change by Dr Caryl Comes 05/2013  . Biv pacemaker generator change out Bilateral 05/11/2013    Procedure: BIV PACEMAKER GENERATOR CHANGE OUT;  Surgeon: Deboraha Sprang, MD;  Location: [facility] CATH LAB;  Service: Cardiovascular;  Laterality: Bilateral;   Past Medical History  Diagnosis Date  . SOB (shortness of breath)   . CAD (coronary artery disease)     Prior LAD stenting with stent thrombosis occurring in a withdrawal phase of her Plavix undertaken because of trigeminal neuralgia and release surgery. It was treated medically.  . Anemia   . CHF (congestive heart failure) ([place])   . GERD (gastroesophageal reflux disease)   . Hyperlipidemia   . Osteoarthritis   . Ischemic cardiomyopathy   . Complication of anesthesia     massive heart attack  . Cancer ([place])     small bladder cancer removed no  treatment  . Cataract    BP 105/61 mmHg  Pulse 66  Resp 14  SpO2 97%  Opioid Risk Score:   Fall Risk Score:  `1  Depression screen PHQ 2/9  Depression screen PHQ 2/9 07/06/2014  Decreased Interest 0  Down, Depressed, Hopeless 0  PHQ - 2 Score 0     Review of Systems  Constitutional:       Loss of taste or smell  Neurological: Positive for weakness.  Psychiatric/Behavioral: Positive for dysphoric mood.  All other systems reviewed and are negative.      Objective:   Physical Exam  Constitutional: She is oriented to person, place, and time. She appears well-developed and well-nourished.  HENT:  Head: Normocephalic and atraumatic.  Neck: Normal range of motion. Neck supple.  Cardiovascular: Normal rate and regular rhythm.   Pulmonary/Chest: Effort normal and breath sounds normal.  Musculoskeletal:  Normal Muscle Bulk and Muscle  testing Reveals: Upper Extremities: Full ROM and Muscle Strength 5/5 Lower Extremities: Full ROM and Muscle Strength 5/5 Arises from chair with ease Narrow Based Gait  Neurological: She is alert and oriented to person, place, and time.  Skin: Skin is warm and dry.  Psychiatric: She has a normal mood and affect.  Nursing note and vitals reviewed.         Assessment & Plan:  1. Right sided trigeminal neuralgia: Refilled: oxyCODONE 20 mg one tablet every 8 hours as needed # 90 tablets.  Has an appointment with Dr. Rosine Door at [facility] 03/01/2015 2. Lumbar postlaminectomy syndrome.: Stable at this time.  3. Osteoarthritis of the knees: Continue with Voltaren gel  4. Morton's Neuroma: No complaints: F/U with Podiatrist Dr. Paulla Dolly.  20 minutes of face to face patient care time was spent during this visit. All questions were encouraged and answered

## 2015-03-02 ENCOUNTER — Ambulatory Visit (INDEPENDENT_AMBULATORY_CARE_PROVIDER_SITE_OTHER): Payer: Medicare Other | Admitting: Podiatry

## 2015-03-02 ENCOUNTER — Telehealth: Payer: Self-pay | Admitting: Internal Medicine

## 2015-03-02 ENCOUNTER — Ambulatory Visit (INDEPENDENT_AMBULATORY_CARE_PROVIDER_SITE_OTHER): Payer: Medicare Other

## 2015-03-02 ENCOUNTER — Encounter: Payer: Self-pay | Admitting: Podiatry

## 2015-03-02 VITALS — BP 103/63 | HR 74 | Resp 16

## 2015-03-02 DIAGNOSIS — M79672 Pain in left foot: Secondary | ICD-10-CM | POA: Diagnosis not present

## 2015-03-02 DIAGNOSIS — M779 Enthesopathy, unspecified: Secondary | ICD-10-CM | POA: Diagnosis not present

## 2015-03-02 MED ORDER — TRIAMCINOLONE ACETONIDE 10 MG/ML IJ SUSP
10.0000 mg | Freq: Once | INTRAMUSCULAR | Status: AC
  Administered 2015-03-02: 10 mg

## 2015-03-02 NOTE — Telephone Encounter (Signed)
LMTCB//sss 

## 2015-03-02 NOTE — Telephone Encounter (Signed)
New problem    Pt has a question about the letter she received about getting her deviced checked. Please call pt.

## 2015-03-05 ENCOUNTER — Other Ambulatory Visit: Payer: Medicare Other

## 2015-03-05 ENCOUNTER — Other Ambulatory Visit (INDEPENDENT_AMBULATORY_CARE_PROVIDER_SITE_OTHER): Payer: Medicare Other

## 2015-03-05 DIAGNOSIS — E890 Postprocedural hypothyroidism: Secondary | ICD-10-CM | POA: Diagnosis not present

## 2015-03-05 LAB — T4, FREE: FREE T4: 0.77 ng/dL (ref 0.60–1.60)

## 2015-03-05 LAB — TSH: TSH: 5.66 u[IU]/mL — ABNORMAL HIGH (ref 0.35–4.50)

## 2015-03-05 NOTE — Progress Notes (Signed)
Subjective:     Patient ID: [name], female   DOB: 1945-04-17, 69 y.o.   MRN: [id number]  HPI patient presents stating she still having pain around this joint and it's making it hard for her to walk comfortably   Review of Systems     Objective:   Physical Exam Neurovascular status intact muscle strength adequate with inflammation that's changed slightly and is affecting the fourth metatarsal joint currently    Assessment:     Inflammatory capsulitis    Plan:     I did do a proximal block and carefully aspirated this joint to try to reduce inflammation. We applied padding try to take pressure off of it may need to consider other treatments if symptoms continue to persist

## 2015-03-09 ENCOUNTER — Ambulatory Visit: Payer: Medicare Other | Admitting: Endocrinology

## 2015-03-12 ENCOUNTER — Ambulatory Visit: Payer: Medicare Other

## 2015-03-14 ENCOUNTER — Ambulatory Visit: Payer: Medicare Other | Admitting: Cardiology

## 2015-03-15 ENCOUNTER — Ambulatory Visit (INDEPENDENT_AMBULATORY_CARE_PROVIDER_SITE_OTHER): Payer: Medicare Other | Admitting: Podiatry

## 2015-03-15 ENCOUNTER — Encounter: Payer: Self-pay | Admitting: Podiatry

## 2015-03-15 VITALS — BP 105/58 | HR 67 | Resp 16

## 2015-03-15 DIAGNOSIS — M779 Enthesopathy, unspecified: Secondary | ICD-10-CM

## 2015-03-15 DIAGNOSIS — M2042 Other hammer toe(s) (acquired), left foot: Secondary | ICD-10-CM

## 2015-03-15 NOTE — Telephone Encounter (Signed)
Spoke to patient about [employer] recall. I explained to patient the importance of remote monitoring and keeping f/u appts.   Will update patient's information in [place] and ship patient a new home monitor.   Since patient is overdue to see Dr.Klein will have scheduler to call pt to schedule an appt.

## 2015-03-15 NOTE — Telephone Encounter (Signed)
Follow Up  Pt returned call. Please call after 2pm. Pt has other appts

## 2015-03-16 NOTE — Progress Notes (Signed)
Subjective:     Patient ID: [name], female   DOB: Jan 23, 1946, 69 y.o.   MRN: [id number]  HPI patient presents with pain in the forefoot left an states that the medication that we utilized only gave her minimal relief   Review of Systems     Objective:   Physical Exam  neurovascular status intact with patient noted to have continued significant discomfort in the second and third metatarsophalangeal joint of the left foot with also forefoot discomfort noted    Assessment:      inflammatory capsulitis that has failed to respond so far to aggressive treatment    Plan:      H&P condition reviewed and at this point due to the intense discomfort I have recommended immobilization with boot therapy. Patient is placed an air fracture walker which is tolerated well and seems to relieve her pain and she'll be seen back in 3-4 weeks

## 2015-03-22 ENCOUNTER — Ambulatory Visit (INDEPENDENT_AMBULATORY_CARE_PROVIDER_SITE_OTHER): Payer: Medicare Other | Admitting: Endocrinology

## 2015-03-22 ENCOUNTER — Encounter: Payer: Self-pay | Admitting: Endocrinology

## 2015-03-22 VITALS — BP 116/72 | HR 75 | Temp 98.0°F | Resp 14 | Ht 62.0 in | Wt 130.0 lb

## 2015-03-22 DIAGNOSIS — E890 Postprocedural hypothyroidism: Secondary | ICD-10-CM

## 2015-03-22 MED ORDER — LEVOTHYROXINE SODIUM 112 MCG PO TABS: 112.0000 ug | ORAL_TABLET | Freq: Every day | ORAL | Status: DC

## 2015-03-22 NOTE — Progress Notes (Signed)
Patient ID: [name], female   DOB: 21-Nov-1945, 69 y.o.   MRN: [id number]    Reason for Appointment:  Post ablative hypothyroidism, followup  History of Present Illness:    She became hypothyroid in 03/2012 following radioactive iodine treatment for hyperthyroidism in 11/13  She was initially started with 75 mcg of levothyroxine but with her TSH being nearly 50 in 3/14 the dose was increased to 137 mcg Subsequently has required relatively lower doses   In 11/2013 because of her TSH being significant suppressed her dose was reduced to 100 g   Usually she tends to have chronic tiredness regardless of her thyroid levels   She had been losing weight progressively in 2016 but now has gained back significant amount  Does not feel any different recently  She has mild chronic cold intolerance and normally keeps her house warm because of her trigeminal neuralgia   She is quite compliant with taking her medication daily, usually takes it after breakfast for better tolerability Does not take any other medications except gabapentin with this and  Is taking iron in the evening  Although her TSH was low normal in August it is now relatively higher at 5.7   Wt Readings from Last 3 Encounters:  03/22/15 130 lb (58.968 kg)  11/23/14 114 lb (51.71 kg)  11/06/14 117 lb 9.6 oz (53.343 kg)   Labs:  Lab Results  Component Value Date   TSH 5.66* 03/05/2015   TSH 0.56 11/01/2014   TSH 3.07 07/03/2014   FREET4 0.77 03/05/2015   FREET4 1.26 11/01/2014   FREET4 0.91 07/03/2014         Medication List       This list is accurate as of: 03/22/15 10:25 AM.  Always use your most recent med list.               artificial tears ointment  Place into the right eye as needed.     aspirin 325 MG EC tablet  Take 325 mg by mouth daily.     bacitracin ophthalmic ointment     carvedilol 3.125 MG tablet  Commonly known as:  COREG  Take 1 tablet (3.125 mg total) by mouth 2 (two) times  daily with a meal.     clorazepate 7.5 MG tablet  Commonly known as:  TRANXENE     cyclobenzaprine 5 MG tablet  Commonly known as:  FLEXERIL  TAKE 1 TABLET BY MOUTH 3 TIMES A DAY AS NEEDED FOR MUSCLE SPASMS     diclofenac sodium 1 % Gel  Commonly known as:  VOLTAREN  Apply 1 application topically 3 (three) times daily. Apply to knees and hands     furosemide 80 MG tablet  Commonly known as:  LASIX  Take 1 tablet (80 mg total) by mouth daily.     gabapentin 800 MG tablet  Commonly known as:  NEURONTIN  Take 800 mg by mouth 4 (four) times daily as needed. For pain     iron polysaccharides 150 MG capsule  Commonly known as:  NIFEREX  Take 150 mg by mouth 2 (two) times daily.     levothyroxine 112 MCG tablet  Commonly known as:  SYNTHROID, LEVOTHROID  Take 1 tablet (112 mcg total) by mouth daily.     methocarbamol 500 MG tablet  Commonly known as:  ROBAXIN  Take 1 tablet (500 mg total) by mouth 4 (four) times daily.     NITROSTAT 0.4 MG SL tablet  Generic drug:  nitroGLYCERIN  TAKE 1 TABLET AS DIRECTED SUBLINGUALLY AS DIRECTED FOR CHEST PAIN     Oxcarbazepine 300 MG tablet  Commonly known as:  TRILEPTAL  TAKE 1 TABLET (300 MG TOTAL) BY MOUTH 2 (TWO) TIMES DAILY.     Oxycodone HCl 20 MG Tabs  Take 1 tablet (20 mg total) by mouth every 8 (eight) hours as needed.     potassium chloride SA 20 MEQ tablet  Commonly known as:  KLOR-CON M20  Take 1 tablet (20 mEq total) by mouth daily.     prednisoLONE acetate 1 % ophthalmic suspension  Commonly known as:  PRED FORTE     promethazine 25 MG tablet  Commonly known as:  PHENERGAN  Take 1 tablet (25 mg total) by mouth every 6 (six) hours as needed. for nasuea     ramipril 2.5 MG capsule  Commonly known as:  ALTACE  TAKE 1 CAPSULE (2.5 MG TOTAL) BY MOUTH DAILY. DAILY     REFRESH TEARS 0.5 % Soln  Generic drug:  carboxymethylcellulose  1 drop.     simvastatin 10 MG tablet  Commonly known as:  ZOCOR  TAKE 1 TABLET BY  MOUTH EVERY DAY     sodium chloride 5 % ophthalmic ointment  Commonly known as:  MURO 0000000  Place 1 application into the right eye.     spironolactone 25 MG tablet  Commonly known as:  ALDACTONE  Take 0.5 tablets (12.5 mg total) by mouth daily.     tobramycin-dexamethasone ophthalmic solution  Commonly known as:  TOBRADEX     traMADol 50 MG tablet  Commonly known as:  ULTRAM  TAKE 1 TABLET EVERY 6 HOURS AS NEEDED     valACYclovir 500 MG tablet  Commonly known as:  VALTREX  Take 1 tablet by mouth as directed.     venlafaxine XR 150 MG 24 hr capsule  Commonly known as:  EFFEXOR-XR     VIGAMOX 0.5 % ophthalmic solution  Generic drug:  moxifloxacin     zolpidem 10 MG tablet  Commonly known as:  AMBIEN  Take 10 mg by mouth at bedtime as needed. For sleep            Past Medical History  Diagnosis Date  . SOB (shortness of breath)   . CAD (coronary artery disease)     Prior LAD stenting with stent thrombosis occurring in a withdrawal phase of her Plavix undertaken because of trigeminal neuralgia and release surgery. It was treated medically.  . Anemia   . CHF (congestive heart failure) ([place])   . GERD (gastroesophageal reflux disease)   . Hyperlipidemia   . Osteoarthritis   . Ischemic cardiomyopathy   . Complication of anesthesia     massive heart attack  . Cancer ([place])     small bladder cancer removed no  treatment  . Cataract     Past Surgical History  Procedure Laterality Date  . Brain surgery      x3  . Appendectomy    . Joint replacement      thumbs and knees replacement  . Tubal ligation    . Back surgery      x2 back surgeries  . Eye surgery Right 05/24/12  . Bi-ventricular implantable cardioverter defibrillator  (crt-d)  2009; 05/2013    STJ CRTD generator change by Dr Caryl Comes 05/2013  . Biv pacemaker generator change out Bilateral 05/11/2013    Procedure: BIV PACEMAKER GENERATOR CHANGE OUT;  Surgeon: Deboraha Sprang, MD;  Location: [facility] CATH [facility];  Service:  Cardiovascular;  Laterality: Bilateral;    Family History  Problem Relation Age of Onset  . Heart disease Father     Social History:  reports that she has been smoking Cigarettes.  She has a 15 pack-year smoking history. She has never used smokeless tobacco. She reports that she does not drink alcohol or use illicit drugs.  Allergies: No Known Allergies  Review of Systems:  Weight changes: She thinks she has gained weight from eating a lot of ice cream, apparently no organic cause found by PCP  CARDIOLOGY: she has history of coronary artery disease with multiple events and interventions She has had systolic congestive heart failure  History of trigeminal neuralgia .        She has difficulty with memory  She complains of nausea   Examination:   BP 116/72 mmHg  Pulse 75  Temp(Src) 98 F (36.7 C)  Resp 14  Ht 5\' 2"  (1.575 m)  Wt 130 lb (58.968 kg)  BMI 23.77 kg/m2  SpO2 95%  She looks well Neurological: REFLEXES: at biceps are normal   Skin not unusually dry    Assessment   Post ablative hypothyroidism:  She has gained a significant amount of weight back in the last visit  She continues to feel fatigued and not clear if this is related to her thyroid Since she has gained a significant amount of weight she may be requiring higher doses of levothyroxine now Will go up to 112 g On her dose, she can finish her current supply before changing  She will need to follow-up in 3 months again   [facility] 03/22/2015, 10:25 AM

## 2015-03-24 ENCOUNTER — Other Ambulatory Visit: Payer: Self-pay | Admitting: Cardiology

## 2015-03-26 NOTE — Progress Notes (Signed)
Cardiology Office Note    Date:  03/27/2015   ID:  [name] [birth year], MRN [id number]  PCP:  Shirline Frees, MD  Cardiologist:  Dr. Candee Furbish   Electrophysiologist:  Dr. Virl Axe   Chief Complaint  Patient presents with  . Follow-up    pt has no complaints  . Congestive Heart Failure    History of Present Illness:  [name] is a 69 y.o. female with a hx of CAD status post PCI to the LAD in 2006 and BMS to LAD in 04/2007, ischemic cardiomyopathy, systolic CHF, s/p CRT-D, HL, prior bladder CA, depression post RAI hypothyroidism.  Last seen by [name] 8/16. She was somewhat hypotensive. Dosages of her medications were reduced. Two-week follow-up was recommended. We have not seen her since that time. She returns today for follow-up.   She denies significant DOE.  However, she is fairly sedentary.  She has occasional episodes of chest pain.  She has had these off and on for years.  She denies any worsening. She denies exertional chest pain.  Her chest symptoms are usually brought on by emotional stress.  She denies orthopnea, PND, edema.  She denies syncope.  She has a lot of pain in her face from trigeminal neuralgia.  She is considering surgery at [facility] in Feb (Dr. Leonia Corona).  Procedure is called dorsal root entry zone (DREZ) procedure.  This procedure involves radiofrequency ablation of the nerves in the mid brain to stop her pain. Of note, she had prolonged, complicated course after surgery for trigeminal neuralgia at [facility] in 2008.  She had perioperative MI.   Past Medical History  Diagnosis Date  . SOB (shortness of breath)   . CAD (coronary artery disease)     Prior LAD stenting with stent thrombosis occurring in a withdrawal phase of her Plavix undertaken because of trigeminal neuralgia and release surgery. It was treated medically.  . Anemia   . CHF (congestive heart failure) ([place])   . GERD (gastroesophageal reflux disease)   . Hyperlipidemia   .  Osteoarthritis   . Ischemic cardiomyopathy   . Complication of anesthesia     massive heart attack  . Cancer ([place])     small bladder cancer removed no  treatment  . Cataract     Past Surgical History  Procedure Laterality Date  . Brain surgery      x3  . Appendectomy    . Joint replacement      thumbs and knees replacement  . Tubal ligation    . Back surgery      x2 back surgeries  . Eye surgery Right 05/24/12  . Bi-ventricular implantable cardioverter defibrillator  (crt-d)  2009; 05/2013    STJ CRTD generator change by Dr Caryl Comes 05/2013  . Biv pacemaker generator change out Bilateral 05/11/2013    Procedure: BIV PACEMAKER GENERATOR CHANGE OUT;  Surgeon: Deboraha Sprang, MD;  Location: [facility] CATH LAB;  Service: Cardiovascular;  Laterality: Bilateral;    Current Outpatient Prescriptions  Medication Sig Dispense Refill  . Artificial Tear Ointment (ARTIFICIAL TEARS) ointment Place into the right eye as needed.    Marland Kitchen aspirin 325 MG EC tablet Take 325 mg by mouth daily.    . bacitracin ophthalmic ointment     . carboxymethylcellulose (REFRESH TEARS) 0.5 % SOLN 1 drop.    . carvedilol (COREG) 3.125 MG tablet Take 1 tablet (3.125 mg total) by mouth 2 (two) times daily with a meal.    .  clorazepate (TRANXENE) 7.5 MG tablet Take 7.5 mg by mouth at bedtime.   4  . cyclobenzaprine (FLEXERIL) 5 MG tablet TAKE 1 TABLET BY MOUTH 3 TIMES A DAY AS NEEDED FOR MUSCLE SPASMS  0  . diclofenac sodium (VOLTAREN) 1 % GEL Apply 1 application topically 3 (three) times daily. Apply to knees and hands 5 Tube 3  . furosemide (LASIX) 80 MG tablet Take 1 tablet (80 mg total) by mouth daily. 30 tablet 5  . gabapentin (NEURONTIN) 800 MG tablet Take 800 mg by mouth 4 (four) times daily as needed. For pain    . iron polysaccharides (NIFEREX) 150 MG capsule Take 150 mg by mouth 2 (two) times daily.    Marland Kitchen levothyroxine (SYNTHROID, LEVOTHROID) 112 MCG tablet Take 1 tablet (112 mcg total) by mouth daily. 30 tablet 3  .  methocarbamol (ROBAXIN) 500 MG tablet Take 1 tablet (500 mg total) by mouth 4 (four) times daily. 120 tablet 2  . nitroGLYCERIN (NITROSTAT) 0.4 MG SL tablet TAKE 1 TABLET AS DIRECTED SUBLINGUALLY AS DIRECTED FOR CHEST PAIN 25 tablet 1  . Oxcarbazepine (TRILEPTAL) 300 MG tablet TAKE 1 TABLET (300 MG TOTAL) BY MOUTH 2 (TWO) TIMES DAILY. 60 tablet 3  . Oxycodone HCl 20 MG TABS Take 1 tablet (20 mg total) by mouth every 8 (eight) hours as needed. 90 tablet 0  . potassium chloride SA (KLOR-CON M20) 20 MEQ tablet Take 1 tablet (20 mEq total) by mouth daily.    . prednisoLONE acetate (PRED FORTE) 1 % ophthalmic suspension     . promethazine (PHENERGAN) 25 MG tablet Take 1 tablet (25 mg total) by mouth every 6 (six) hours as needed. for nasuea 90 tablet 1  . ramipril (ALTACE) 2.5 MG capsule TAKE 1 CAPSULE (2.5 MG TOTAL) BY MOUTH DAILY. DAILY 90 capsule 1  . simvastatin (ZOCOR) 10 MG tablet TAKE 1 TABLET BY MOUTH EVERY DAY 30 tablet 6  . sodium chloride (MURO 128) 5 % ophthalmic ointment Place 1 application into the right eye.    . spironolactone (ALDACTONE) 25 MG tablet Take 0.5 tablets (12.5 mg total) by mouth daily.    . traMADol (ULTRAM) 50 MG tablet TAKE 1 TABLET EVERY 6 HOURS AS NEEDED 60 tablet 1  . valACYclovir (VALTREX) 500 MG tablet Take 1 tablet by mouth as directed.    . venlafaxine XR (EFFEXOR-XR) 150 MG 24 hr capsule   4  . zolpidem (AMBIEN) 10 MG tablet Take 10 mg by mouth at bedtime as needed. For sleep     No current facility-administered medications for this visit.    Allergies:   Review of patient's allergies indicates no known allergies.   Social History   Social History  . Marital Status: Legally Separated    Spouse Name: N/A  . Number of Children: N/A  . Years of Education: N/A   Social History Main Topics  . Smoking status: Current Some Day Smoker -- 0.50 packs/day for 30 years    Types: Cigarettes  . Smokeless tobacco: Never Used  . Alcohol Use: No  . Drug Use: No  .  Sexual Activity: No   Other Topics Concern  . None   Social History Narrative     Family History:  The patient's family history includes Heart disease in her father.   ROS:   Please see the history of present illness.    Review of Systems  Eyes: Positive for visual disturbance.  Cardiovascular: Positive for dyspnea on exertion.  Gastrointestinal: Positive  for abdominal pain, nausea and vomiting.  Psychiatric/Behavioral: Positive for depression. The patient is nervous/anxious.   All other systems reviewed and are negative.   PHYSICAL EXAM:   VS:  BP 90/62 mmHg  Pulse 76  Ht 5\' 2"  (1.575 m)  Wt 133 lb 3.2 oz (60.419 kg)  BMI 24.36 kg/m2  SpO2 96%   GEN: Well nourished, well developed, in no acute distress HEENT: normal Neck: no JVD, no masses Cardiac: Normal S1/S2, RRR; no murmurs, rubs, or gallops, no edema;     Respiratory:  clear to auscultation bilaterally; no wheezing, rhonchi or rales GI: soft, nontender, nondistended, + BS MS: no deformity or atrophy Skin: warm and dry, no rash Neuro:  Bilateral strength equal, no focal deficits  Psych: Alert and oriented x 3, normal affect  Wt Readings from Last 3 Encounters:  03/27/15 133 lb 3.2 oz (60.419 kg)  03/22/15 130 lb (58.968 kg)  11/23/14 114 lb (51.71 kg)      Studies/Labs Reviewed:   EKG:  EKG is  ordered today.  The ekg ordered today demonstrates underlying NSR, Bi-V paced, HR 76  Recent Labs: 03/05/2015: TSH 5.66*   Recent Lipid Panel No results found for: CHOL, TRIG, HDL, CHOLHDL, VLDL, LDLCALC, LDLDIRECT  Additional studies/ records that were reviewed today include:   Echo 05/05/13 Global HK with anteroseptal/anterior/anterolateral/inferoseptal/apical/anterior akinesis, EF 0000000, grade 3 diastolic dysfunction, moderate MR, moderate LAE, mildly reduced RVSF, moderate TR, mild to moderate PI, PASP 65 mmHg, left pleural effusion  LHC 04/2007 FINAL IMPRESSION: 1. Atherosclerotic coronary vascular disease,  single vessel. 2. Severe left ventricular systolic dysfunction secondary to acute anterolateral wall myocardial infarction, September 2008, viability demonstrated by rest thallium. 3. Status status post bare metal stent implantation, proximal anterior descending artery.    ASSESSMENT:    1. Chronic systolic heart failure ([place])   2. Ischemic cardiomyopathy   3. Coronary artery disease involving native coronary artery of native heart with angina pectoris ([place])   4. Essential hypertension   5. Hyperlipidemia   6. Biventricular ICD (implantable cardioverter-defibrillator) in place   7. Tobacco use   8. Pre-operative cardiovascular examination     PLAN:  In order of problems listed above:  1. Chronic systolic CHF -  Volume appears stable. Check follow-up BMET today.  2. Ischemic cardiopathy -  Blood pressure remains soft. She is on a minimal dose of beta blocker, ACE inhibitor and spironolactone. She seems to be asymptomatic. Check follow-up BMET today.  3. CAD -  She has occasional anginal symptoms. These have been stable for the past 2 years. As she is contemplating surgery, I will arrange Lexiscan Myoview for risk stratification. Continue aspirin, beta blocker, statin.  4. HTN -  As noted, blood pressure remains soft.  5. HL - Managed by primary care. Continue statin.  6. S/p CRT-D - Follow-up with the EP as planned.  7. Tobacco abuse - She is not ready to quit.  8. Surgical clearance - As noted, she is contemplating DREZ procedure at [facility] with Dr. Rosine Door in February. The last time she had neurosurgery, she had a myocardial infarction with a prolonged hospitalization. As noted, I will arrange a Lexiscan Myoview for risk stratification. I would like for her to follow-up with [name], her primary cardiologist, prior to making final recommendations regarding surgery given her prior history.  Of note, the patient tells me that she has been requesting more doses of her pain medication  from her pain physician. She asked me several  times to approve a higher dose of pain medication for her. I explained to her that this is up to her pain physician and she should discuss this with him further.    Medication Adjustments/Labs and Tests Ordered: Current medicines are reviewed at length with the patient today.  Concerns regarding medicines are outlined above.  Medication changes, Labs and Tests ordered today are listed below. Patient Instructions  Medication Instructions:  Your physician recommends that you continue on your current medications as directed. Please refer to the Current Medication list given to you today. 1. Nitrocglycerin was filled at visit today  Labwork: Your physician recommends that you have lab work today: bmet  Testing/Procedures: Your physician has requested that you have a lexiscan myoview. For further information please visit [email or web address]. Please follow instruction sheet, as given.  Follow-Up: Your physician recommends that you keep your scheduled  follow-up appointment with [name]  Any Other Special Instructions Will Be Listed Below (If Applicable).  If you need a refill on your cardiac medications before your next appointment, please call your pharmacy.    Signed, Richardson Dopp, PA-C  03/27/2015 4:28 PM    [facility] [address], [place], [place]  [address] Phone: [phone]; Fax: [phone]

## 2015-03-27 ENCOUNTER — Encounter: Payer: Self-pay | Admitting: Physician Assistant

## 2015-03-27 ENCOUNTER — Ambulatory Visit (INDEPENDENT_AMBULATORY_CARE_PROVIDER_SITE_OTHER): Payer: Medicare Other | Admitting: Physician Assistant

## 2015-03-27 VITALS — BP 90/62 | HR 76 | Ht 62.0 in | Wt 133.2 lb

## 2015-03-27 DIAGNOSIS — Z95810 Presence of automatic (implantable) cardiac defibrillator: Secondary | ICD-10-CM

## 2015-03-27 DIAGNOSIS — I255 Ischemic cardiomyopathy: Secondary | ICD-10-CM | POA: Diagnosis not present

## 2015-03-27 DIAGNOSIS — I10 Essential (primary) hypertension: Secondary | ICD-10-CM

## 2015-03-27 DIAGNOSIS — I25119 Atherosclerotic heart disease of native coronary artery with unspecified angina pectoris: Secondary | ICD-10-CM | POA: Diagnosis not present

## 2015-03-27 DIAGNOSIS — I5022 Chronic systolic (congestive) heart failure: Secondary | ICD-10-CM

## 2015-03-27 DIAGNOSIS — Z01810 Encounter for preprocedural cardiovascular examination: Secondary | ICD-10-CM

## 2015-03-27 DIAGNOSIS — E785 Hyperlipidemia, unspecified: Secondary | ICD-10-CM

## 2015-03-27 DIAGNOSIS — Z720 Tobacco use: Secondary | ICD-10-CM

## 2015-03-27 LAB — BASIC METABOLIC PANEL
BUN: 15 mg/dL (ref 7–25)
CHLORIDE: 102 mmol/L (ref 98–110)
CO2: 26 mmol/L (ref 20–31)
Calcium: 9.5 mg/dL (ref 8.6–10.4)
Creat: 0.84 mg/dL (ref 0.60–0.93)
GLUCOSE: 69 mg/dL (ref 65–99)
POTASSIUM: 4.4 mmol/L (ref 3.5–5.3)
SODIUM: 136 mmol/L (ref 135–146)

## 2015-03-27 MED ORDER — NITROGLYCERIN 0.4 MG SL SUBL: SUBLINGUAL_TABLET | SUBLINGUAL | Status: DC

## 2015-03-27 NOTE — Patient Instructions (Addendum)
Medication Instructions:  Your physician recommends that you continue on your current medications as directed. Please refer to the Current Medication list given to you today. 1. Nitrocglycerin was filled at visit today  Labwork: Your physician recommends that you have lab work today: bmet  Testing/Procedures: Your physician has requested that you have a lexiscan myoview. For further information please visit [email or web address]. Please follow instruction sheet, as given.  Follow-Up: Your physician recommends that you keep your scheduled  follow-up appointment with [name]  Any Other Special Instructions Will Be Listed Below (If Applicable).  If you need a refill on your cardiac medications before your next appointment, please call your pharmacy.

## 2015-03-28 ENCOUNTER — Ambulatory Visit: Payer: Medicare Other | Admitting: Cardiology

## 2015-03-28 ENCOUNTER — Encounter: Payer: Medicare Other | Attending: Physical Medicine & Rehabilitation | Admitting: Registered Nurse

## 2015-03-28 ENCOUNTER — Encounter: Payer: Self-pay | Admitting: Registered Nurse

## 2015-03-28 ENCOUNTER — Telehealth: Payer: Self-pay | Admitting: *Deleted

## 2015-03-28 VITALS — BP 104/68 | HR 79

## 2015-03-28 DIAGNOSIS — G500 Trigeminal neuralgia: Secondary | ICD-10-CM | POA: Diagnosis not present

## 2015-03-28 DIAGNOSIS — M25561 Pain in right knee: Secondary | ICD-10-CM

## 2015-03-28 DIAGNOSIS — M174 Other bilateral secondary osteoarthritis of knee: Secondary | ICD-10-CM

## 2015-03-28 DIAGNOSIS — G894 Chronic pain syndrome: Secondary | ICD-10-CM | POA: Diagnosis not present

## 2015-03-28 DIAGNOSIS — Z79899 Other long term (current) drug therapy: Secondary | ICD-10-CM

## 2015-03-28 DIAGNOSIS — M172 Bilateral post-traumatic osteoarthritis of knee: Secondary | ICD-10-CM | POA: Diagnosis not present

## 2015-03-28 DIAGNOSIS — M25562 Pain in left knee: Secondary | ICD-10-CM

## 2015-03-28 DIAGNOSIS — Z5181 Encounter for therapeutic drug level monitoring: Secondary | ICD-10-CM | POA: Diagnosis not present

## 2015-03-28 MED ORDER — OXYCODONE HCL 20 MG PO TABS: 20.0000 mg | ORAL_TABLET | Freq: Three times a day (TID) | ORAL | Status: DC | PRN

## 2015-03-28 NOTE — Telephone Encounter (Signed)
Pt notified of lab results by phone with verbal understanding.  

## 2015-03-28 NOTE — Progress Notes (Signed)
Subjective:    Patient ID: [name], female    DOB: Apr 17, 1945, 69 y.o.   MRN: [id number]  HPI: [name] is a 69 year old female who returns for follow up for chronic pain and medication refill. She says her pain is located in her bilateral hands and right temporal pain.. She did not rate her pain today.Her current exercise regime is walking in her home. [name] seen  Dr. Rosine Door 03/01/2015 at [facility] regarding new options for her Trigeminal neuralgia she states the surgery is intensive and she doesn't think she will consider this option. [name] asked again if we would increase her tablets, she is aware her analgesic medication will remain the same.  [name] remains with unsteady gait using the walls to support her, she refuses to see neurologist due to financial hardship. She admits to being depressed about her life of pain no suicidal ideation. she sees a psychaitrist.  Pain Inventory Average Pain NA Pain Right Now NA My pain is intermittent, sharp, dull, stabbing and aching  In the last 24 hours, has pain interfered with the following? General activity 7 Relation with others 7 Enjoyment of life 10 What TIME of day is your pain at its worst? Varies Sleep (in general) Good  Pain is worse with: walking and bending Pain improves with: rest, heat/ice and medication Relief from Meds: 6  Mobility walk without assistance how many minutes can you walk? varies ability to climb steps?  yes do you drive?  yes Do you have any goals in this area?  no  Function retired Do you have any goals in this area?  no  Neuro/Psych weakness depression anxiety loss of taste or smell  Prior Studies Any changes since last visit?  no  Physicians involved in your care Any changes since last visit?  no   Family History  Problem Relation Age of Onset  . Heart disease Father    Social History   Social History  . Marital Status: Legally Separated    Spouse  Name: N/A  . Number of Children: N/A  . Years of Education: N/A   Social History Main Topics  . Smoking status: Current Some Day Smoker -- 0.50 packs/day for 30 years    Types: Cigarettes  . Smokeless tobacco: Never Used  . Alcohol Use: No  . Drug Use: No  . Sexual Activity: No   Other Topics Concern  . None   Social History Narrative   Past Surgical History  Procedure Laterality Date  . Brain surgery      x3  . Appendectomy    . Joint replacement      thumbs and knees replacement  . Tubal ligation    . Back surgery      x2 back surgeries  . Eye surgery Right 05/24/12  . Bi-ventricular implantable cardioverter defibrillator  (crt-d)  2009; 05/2013    STJ CRTD generator change by Dr Caryl Comes 05/2013  . Biv pacemaker generator change out Bilateral 05/11/2013    Procedure: BIV PACEMAKER GENERATOR CHANGE OUT;  Surgeon: Deboraha Sprang, MD;  Location: [facility] CATH LAB;  Service: Cardiovascular;  Laterality: Bilateral;   Past Medical History  Diagnosis Date  . SOB (shortness of breath)   . CAD (coronary artery disease)     Prior LAD stenting with stent thrombosis occurring in a withdrawal phase of her Plavix undertaken because of trigeminal neuralgia and release surgery. It was treated medically.  . Anemia   .  CHF (congestive heart failure) ([place])   . GERD (gastroesophageal reflux disease)   . Hyperlipidemia   . Osteoarthritis   . Ischemic cardiomyopathy   . Complication of anesthesia     massive heart attack  . Cancer ([place])     small bladder cancer removed no  treatment  . Cataract    BP 104/68 mmHg  Pulse 79  SpO2 98%  Opioid Risk Score:   Fall Risk Score:  `1  Depression screen PHQ 2/9  Depression screen PHQ 2/9 07/06/2014  Decreased Interest 0  Down, Depressed, Hopeless 0  PHQ - 2 Score 0      Review of Systems  Neurological: Positive for weakness.  Psychiatric/Behavioral:       Depression Loss of taste or smell  All other systems reviewed and are negative.       Objective:   Physical Exam  Constitutional: She is oriented to person, place, and time. She appears well-developed and well-nourished.  HENT:  Head: Normocephalic and atraumatic.  Right Temporal Tenderness with Palpation  Neck: Normal range of motion. Neck supple.  Cardiovascular: Normal rate and regular rhythm.   Pulmonary/Chest: Effort normal and breath sounds normal.  Musculoskeletal:  Normal Muscle Bulk and Muscle Testing Reveals:  Upper Extremities: Full ROM and Muscle Strength 5/5 Lower Extremities: Full ROM and Muscle Strength 5/5 Arises from chair with ease Unsteady Gait/ Friend supporting  Neurological: She is alert and oriented to person, place, and time.  Skin: Skin is warm and dry.  Psychiatric: She has a normal mood and affect.  Nursing note and vitals reviewed.         Assessment & Plan:  1. Right sided trigeminal neuralgia: Refilled: oxyCODONE 20 mg one tablet every 8 hours as needed # 90 tablets.  Had an appointment with Dr. Rosine Door at [facility] 03/01/2015 2. Lumbar postlaminectomy syndrome.: Stable at this time.  3. Osteoarthritis of the knees: No Complaints Voiced Today: Continue with Voltaren gel  4. Morton's Neuroma: No complaints: F/U with Podiatrist Dr. Paulla Dolly.  20 minutes of face to face patient care time was spent during this visit. All questions were encouraged and answered

## 2015-03-29 ENCOUNTER — Other Ambulatory Visit: Payer: Self-pay | Admitting: Physical Medicine & Rehabilitation

## 2015-04-03 ENCOUNTER — Telehealth (HOSPITAL_COMMUNITY): Payer: Self-pay | Admitting: *Deleted

## 2015-04-03 NOTE — Telephone Encounter (Signed)
Left message on voicemail per DPR in reference to upcoming appointment scheduled on 04/05/15 at 1000 with detailed instructions given per Myocardial Perfusion Study Information Sheet for the test. LM to arrive 15 minutes early, and that it is imperative to arrive on time for appointment to keep from having the test rescheduled. If you need to cancel or reschedule your appointment, please call the office within 24 hours of your appointment. Failure to do so may result in a cancellation of your appointment, and a $50 no show fee. Phone number given for call back for any questions.Arslan Kier, Ranae Palms

## 2015-04-05 ENCOUNTER — Encounter (HOSPITAL_COMMUNITY): Payer: Medicare Other

## 2015-04-05 ENCOUNTER — Telehealth (HOSPITAL_COMMUNITY): Payer: Self-pay | Admitting: *Deleted

## 2015-04-05 NOTE — Telephone Encounter (Signed)
Patient given detailed instructions per Myocardial Perfusion Study Information Sheet for the test on 04/10/15 at 7:30. Patient notified to arrive 15 minutes early and that it is imperative to arrive on time for appointment to keep from having the test rescheduled.  If you need to cancel or reschedule your appointment, please call the office within 24 hours of your appointment. Failure to do so may result in a cancellation of your appointment, and a $50 no show fee. Patient verbalized understanding.[name]

## 2015-04-10 ENCOUNTER — Encounter: Payer: Medicare Other | Admitting: Internal Medicine

## 2015-04-10 ENCOUNTER — Ambulatory Visit (HOSPITAL_COMMUNITY): Payer: Medicare Other

## 2015-04-17 ENCOUNTER — Telehealth (HOSPITAL_COMMUNITY): Payer: Self-pay | Admitting: *Deleted

## 2015-04-17 NOTE — Telephone Encounter (Signed)
Patient given detailed instructions per Myocardial Perfusion Study Information Sheet for the test on 04/19/15 at 0745. Patient notified to arrive 15 minutes early and that it is imperative to arrive on time for appointment to keep from having the test rescheduled.  If you need to cancel or reschedule your appointment, please call the office within 24 hours of your appointment. Failure to do so may result in a cancellation of your appointment, and a $50 no show fee. Patient verbalized understanding.[name], Ranae Palms

## 2015-04-19 ENCOUNTER — Encounter: Payer: Self-pay | Admitting: Internal Medicine

## 2015-04-19 ENCOUNTER — Ambulatory Visit (INDEPENDENT_AMBULATORY_CARE_PROVIDER_SITE_OTHER): Payer: Medicare Other | Admitting: Internal Medicine

## 2015-04-19 ENCOUNTER — Encounter (HOSPITAL_COMMUNITY): Payer: Medicare Other

## 2015-04-19 ENCOUNTER — Ambulatory Visit (INDEPENDENT_AMBULATORY_CARE_PROVIDER_SITE_OTHER): Payer: Medicare Other | Admitting: Cardiology

## 2015-04-19 ENCOUNTER — Ambulatory Visit (HOSPITAL_COMMUNITY): Payer: Medicare Other | Attending: Cardiovascular Disease

## 2015-04-19 ENCOUNTER — Encounter: Payer: Self-pay | Admitting: Cardiology

## 2015-04-19 VITALS — BP 118/64 | HR 67 | Ht 63.0 in | Wt 136.0 lb

## 2015-04-19 VITALS — BP 118/64 | HR 78 | Ht 63.0 in | Wt 136.0 lb

## 2015-04-19 DIAGNOSIS — I517 Cardiomegaly: Secondary | ICD-10-CM | POA: Diagnosis not present

## 2015-04-19 DIAGNOSIS — I2583 Coronary atherosclerosis due to lipid rich plaque: Secondary | ICD-10-CM

## 2015-04-19 DIAGNOSIS — I5022 Chronic systolic (congestive) heart failure: Secondary | ICD-10-CM

## 2015-04-19 DIAGNOSIS — I10 Essential (primary) hypertension: Secondary | ICD-10-CM

## 2015-04-19 DIAGNOSIS — R9439 Abnormal result of other cardiovascular function study: Secondary | ICD-10-CM | POA: Insufficient documentation

## 2015-04-19 DIAGNOSIS — Z95810 Presence of automatic (implantable) cardiac defibrillator: Secondary | ICD-10-CM

## 2015-04-19 DIAGNOSIS — I2510 Atherosclerotic heart disease of native coronary artery without angina pectoris: Secondary | ICD-10-CM

## 2015-04-19 DIAGNOSIS — G500 Trigeminal neuralgia: Secondary | ICD-10-CM

## 2015-04-19 DIAGNOSIS — F17200 Nicotine dependence, unspecified, uncomplicated: Secondary | ICD-10-CM | POA: Insufficient documentation

## 2015-04-19 DIAGNOSIS — I255 Ischemic cardiomyopathy: Secondary | ICD-10-CM

## 2015-04-19 DIAGNOSIS — Z720 Tobacco use: Secondary | ICD-10-CM

## 2015-04-19 DIAGNOSIS — R079 Chest pain, unspecified: Secondary | ICD-10-CM | POA: Insufficient documentation

## 2015-04-19 DIAGNOSIS — Z01810 Encounter for preprocedural cardiovascular examination: Secondary | ICD-10-CM

## 2015-04-19 DIAGNOSIS — I25119 Atherosclerotic heart disease of native coronary artery with unspecified angina pectoris: Secondary | ICD-10-CM | POA: Insufficient documentation

## 2015-04-19 DIAGNOSIS — E785 Hyperlipidemia, unspecified: Secondary | ICD-10-CM | POA: Insufficient documentation

## 2015-04-19 LAB — MYOCARDIAL PERFUSION IMAGING
CHL CUP NUCLEAR SRS: 29
CHL CUP NUCLEAR SSS: 29
CSEPPHR: 77 {beats}/min
LVDIAVOL: 282 mL
LVSYSVOL: 229 mL
NUC STRESS TID: 1.04
RATE: 0.33
Rest HR: 61 {beats}/min
SDS: 0

## 2015-04-19 MED ORDER — TECHNETIUM TC 99M SESTAMIBI GENERIC - CARDIOLITE
10.3000 | Freq: Once | INTRAVENOUS | Status: AC | PRN
  Administered 2015-04-19: 10 via INTRAVENOUS

## 2015-04-19 MED ORDER — TECHNETIUM TC 99M SESTAMIBI GENERIC - CARDIOLITE
32.7000 | Freq: Once | INTRAVENOUS | Status: AC | PRN
  Administered 2015-04-19: 32.7 via INTRAVENOUS

## 2015-04-19 MED ORDER — REGADENOSON 0.4 MG/5ML IV SOLN
0.4000 mg | Freq: Once | INTRAVENOUS | Status: AC
  Administered 2015-04-19: 0.4 mg via INTRAVENOUS

## 2015-04-19 MED ORDER — AMINOPHYLLINE 25 MG/ML IV SOLN
75.0000 mg | Freq: Once | INTRAVENOUS | Status: AC
  Administered 2015-04-19: 75 mg via INTRAVENOUS

## 2015-04-19 NOTE — Progress Notes (Signed)
Patient Care Team: Shirline Frees, MD as PCP - General (Family Medicine)   HPI  [name] is a 70 y.o. female Seen in followup for ICD implantation. She has a CRT St. Jude device in 2009. She had a nonspecific IVCD. She does not recall significant interval benefit. She has a history of ischemic heart disease with prior MI She denies chest pain. She does have dyspnea on exertion. She continues to smoke. She has intermittent left greater than right peripheral edema. She has significant daytime somnolence with a propensity to fall sleep  Records and Results Reviewed she was seen by [name] today. She anticipates surgery for trigeminal neuralgia. Recent Myoview demonstrated high risk findings with an EF of 19%.  She has significant pain from trigeminal neuralgia as noted. It is her impression that more pain medicine would suffice and obviate need of surgery  Past Medical History  Diagnosis Date  . SOB (shortness of breath)   . CAD (coronary artery disease)     Prior LAD stenting with stent thrombosis occurring in a withdrawal phase of her Plavix undertaken because of trigeminal neuralgia and release surgery. It was treated medically.  . Anemia   . CHF (congestive heart failure) ([place])   . GERD (gastroesophageal reflux disease)   . Hyperlipidemia   . Osteoarthritis   . Ischemic cardiomyopathy   . Complication of anesthesia     massive heart attack  . Cancer ([place])     small bladder cancer removed no  treatment  . Cataract     Past Surgical History  Procedure Laterality Date  . Brain surgery      x3  . Appendectomy    . Joint replacement      thumbs and knees replacement  . Tubal ligation    . Back surgery      x2 back surgeries  . Eye surgery Right 05/24/12  . Bi-ventricular implantable cardioverter defibrillator  (crt-d)  2009; 05/2013    STJ CRTD generator change by Dr Caryl Comes 05/2013  . Biv pacemaker generator change out Bilateral 05/11/2013    Procedure: BIV  PACEMAKER GENERATOR CHANGE OUT;  Surgeon: Deboraha Sprang, MD;  Location: [facility] CATH LAB;  Service: Cardiovascular;  Laterality: Bilateral;    Current Outpatient Prescriptions  Medication Sig Dispense Refill  . aspirin 325 MG EC tablet Take 325 mg by mouth daily.    . carboxymethylcellulose (REFRESH TEARS) 0.5 % SOLN Place 1 drop into both eyes daily.     . carvedilol (COREG) 3.125 MG tablet Take 1 tablet (3.125 mg total) by mouth 2 (two) times daily with a meal.    . clorazepate (TRANXENE) 7.5 MG tablet Take 7.5 mg by mouth at bedtime.   4  . cyclobenzaprine (FLEXERIL) 5 MG tablet TAKE 1 TABLET BY MOUTH 3 TIMES A DAY AS NEEDED FOR MUSCLE SPASMS  0  . diclofenac sodium (VOLTAREN) 1 % GEL Apply 1 application topically 3 (three) times daily. Apply to knees and hands 5 Tube 3  . furosemide (LASIX) 80 MG tablet Take 1 tablet (80 mg total) by mouth daily. 30 tablet 5  . gabapentin (NEURONTIN) 800 MG tablet Take 800 mg by mouth 4 (four) times daily as needed. For pain    . iron polysaccharides (NIFEREX) 150 MG capsule Take 150 mg by mouth 2 (two) times daily.    Marland Kitchen levothyroxine (SYNTHROID, LEVOTHROID) 112 MCG tablet Take 1 tablet (112 mcg total) by mouth daily. 30 tablet 3  .  methocarbamol (ROBAXIN) 500 MG tablet Take 1 tablet (500 mg total) by mouth 4 (four) times daily. 120 tablet 2  . nitroGLYCERIN (NITROSTAT) 0.4 MG SL tablet TAKE 1 TABLET AS DIRECTED SUBLINGUALLY AS DIRECTED FOR CHEST PAIN 25 tablet 1  . Oxcarbazepine (TRILEPTAL) 300 MG tablet TAKE 1 TABLET (300 MG TOTAL) BY MOUTH 2 (TWO) TIMES DAILY. 60 tablet 3  . Oxycodone HCl 20 MG TABS Take 1 tablet by mouth every 8 (eight) hours as needed (pain).    . potassium chloride SA (KLOR-CON M20) 20 MEQ tablet Take 1 tablet (20 mEq total) by mouth daily.    . prednisoLONE acetate (PRED FORTE) 1 % ophthalmic suspension Place 1 drop into both eyes every 4 (four) hours.     . promethazine (PHENERGAN) 25 MG tablet Take 1 tablet (25 mg total) by mouth every  6 (six) hours as needed. for nasuea 90 tablet 1  . ramipril (ALTACE) 2.5 MG capsule TAKE 1 CAPSULE (2.5 MG TOTAL) BY MOUTH DAILY. DAILY 90 capsule 1  . simvastatin (ZOCOR) 10 MG tablet TAKE 1 TABLET BY MOUTH EVERY DAY 30 tablet 6  . sodium chloride (MURO 128) 5 % ophthalmic ointment Place 1 application into the right eye at bedtime.     Marland Kitchen spironolactone (ALDACTONE) 25 MG tablet Take 0.5 tablets (12.5 mg total) by mouth daily.    . traMADol (ULTRAM) 50 MG tablet TAKE 1 TABLET BY MOUTH EVERY 6 HOURS AS NEEDED 60 tablet 1  . valACYclovir (VALTREX) 500 MG tablet Take 1 tablet by mouth as directed.    . venlafaxine XR (EFFEXOR-XR) 150 MG 24 hr capsule Take 150 mg by mouth daily with breakfast.   4  . zolpidem (AMBIEN) 10 MG tablet Take 10 mg by mouth at bedtime. For sleep     No current facility-administered medications for this visit.    No Known Allergies    Review of Systems negative except from HPI and PMH  Physical Exam BP 118/64 mmHg  Pulse 67  Ht 5\' 3"  (1.6 m)  Wt 136 lb (61.689 kg)  BMI 24.10 kg/m2 Well developed and well nourished in no acute distress HENT normal E scleral and icterus clear Neck Supple JVP flat; carotids brisk and full Clear to ausculation Device pocket well healed; without hematoma or erythema.  There is no tethering  *Regular rate and rhythm, no murmurs gallops or rub Soft with active bowel sounds No clubbing cyanosis } Edema Alert and oriented, grossly normal motor and sensory function Skin Warm and Dry    Assessment and  Plan I ischemic cardiomyopathy   congestive heart failure-chronic-systolic  Implantable defibrillator-CRT-St. Jude  SVT-nonsustained   I will have her decrease her aspirin from 325-81. She has not had a prior stroke.  Device function is normal.  Nonsustained tachycardias are brief.   I don't think there is an issue with taking her oxycodone 4 times a day. I will write a note for pain physician regarding this.

## 2015-04-19 NOTE — Progress Notes (Signed)
[place]. [address], [address] [place], [place]  [address] Phone: [phone] Fax:  [phone]  Date:  04/19/2015   ID:  [name], DOB [birth year], MRN [id number]  PCP:  Shirline Frees, MD   History of Present Illness: [name] is a 70 y.o. female with ischemic cardiomyopathy ejection fraction in the 15-20% range, Ischemic cardiomyopathy status post Bi-V pacemaker, coronary artery disease with bare-metal stent implantation for in-stent restenosis here for followup/preoperative evaluation.   Her main issue today is discussion of trigeminal neuralgia and potential upcoming surgical procedure at [facility]. She underwent a nuclear stress test today which was high risk, EF 19%, large area of infarct.  Dr. Caryl Comes  provided her with AV optimization of her device. She was told that her device at baseline was fairly synchronized.  She is battled with long-standing depression.    In 2012, we had a long discussion about her cardiomyopathy and overall prognosis.   Sometimes she can feel shortness of breath when walking through her house. She is still smoking. Sometimes she is feeling somewhat dizzy, tired.  No chest pain.    Wt Readings from Last 3 Encounters:  04/19/15 136 lb (61.689 kg)  03/27/15 133 lb 3.2 oz (60.419 kg)  03/22/15 130 lb (58.968 kg)     Past Medical History  Diagnosis Date  . SOB (shortness of breath)   . CAD (coronary artery disease)     Prior LAD stenting with stent thrombosis occurring in a withdrawal phase of her Plavix undertaken because of trigeminal neuralgia and release surgery. It was treated medically.  . Anemia   . CHF (congestive heart failure) ([place])   . GERD (gastroesophageal reflux disease)   . Hyperlipidemia   . Osteoarthritis   . Ischemic cardiomyopathy   . Complication of anesthesia     massive heart attack  . Cancer ([place])     small bladder cancer removed no  treatment  . Cataract     Past Surgical History  Procedure Laterality  Date  . Brain surgery      x3  . Appendectomy    . Joint replacement      thumbs and knees replacement  . Tubal ligation    . Back surgery      x2 back surgeries  . Eye surgery Right 05/24/12  . Bi-ventricular implantable cardioverter defibrillator  (crt-d)  2009; 05/2013    STJ CRTD generator change by Dr Caryl Comes 05/2013  . Biv pacemaker generator change out Bilateral 05/11/2013    Procedure: BIV PACEMAKER GENERATOR CHANGE OUT;  Surgeon: Deboraha Sprang, MD;  Location: [facility] CATH LAB;  Service: Cardiovascular;  Laterality: Bilateral;    Current Outpatient Prescriptions  Medication Sig Dispense Refill  . aspirin 325 MG EC tablet Take 325 mg by mouth daily.    . carboxymethylcellulose (REFRESH TEARS) 0.5 % SOLN Place 1 drop into both eyes daily.     . carvedilol (COREG) 3.125 MG tablet Take 1 tablet (3.125 mg total) by mouth 2 (two) times daily with a meal.    . clorazepate (TRANXENE) 7.5 MG tablet Take 7.5 mg by mouth at bedtime.   4  . cyclobenzaprine (FLEXERIL) 5 MG tablet TAKE 1 TABLET BY MOUTH 3 TIMES A DAY AS NEEDED FOR MUSCLE SPASMS  0  . diclofenac sodium (VOLTAREN) 1 % GEL Apply 1 application topically 3 (three) times daily. Apply to knees and hands 5 Tube 3  . furosemide (LASIX) 80 MG tablet Take  1 tablet (80 mg total) by mouth daily. 30 tablet 5  . gabapentin (NEURONTIN) 800 MG tablet Take 800 mg by mouth 4 (four) times daily as needed. For pain    . iron polysaccharides (NIFEREX) 150 MG capsule Take 150 mg by mouth 2 (two) times daily.    Marland Kitchen levothyroxine (SYNTHROID, LEVOTHROID) 112 MCG tablet Take 1 tablet (112 mcg total) by mouth daily. 30 tablet 3  . methocarbamol (ROBAXIN) 500 MG tablet Take 1 tablet (500 mg total) by mouth 4 (four) times daily. 120 tablet 2  . nitroGLYCERIN (NITROSTAT) 0.4 MG SL tablet TAKE 1 TABLET AS DIRECTED SUBLINGUALLY AS DIRECTED FOR CHEST PAIN 25 tablet 1  . Oxcarbazepine (TRILEPTAL) 300 MG tablet TAKE 1 TABLET (300 MG TOTAL) BY MOUTH 2 (TWO) TIMES DAILY. 60  tablet 3  . Oxycodone HCl 20 MG TABS Take 1 tablet by mouth every 8 (eight) hours as needed (pain).    . potassium chloride SA (KLOR-CON M20) 20 MEQ tablet Take 1 tablet (20 mEq total) by mouth daily.    . prednisoLONE acetate (PRED FORTE) 1 % ophthalmic suspension Place 1 drop into both eyes every 4 (four) hours.     . promethazine (PHENERGAN) 25 MG tablet Take 1 tablet (25 mg total) by mouth every 6 (six) hours as needed. for nasuea 90 tablet 1  . ramipril (ALTACE) 2.5 MG capsule TAKE 1 CAPSULE (2.5 MG TOTAL) BY MOUTH DAILY. DAILY 90 capsule 1  . simvastatin (ZOCOR) 10 MG tablet TAKE 1 TABLET BY MOUTH EVERY DAY 30 tablet 6  . sodium chloride (MURO 128) 5 % ophthalmic ointment Place 1 application into the right eye at bedtime.     Marland Kitchen spironolactone (ALDACTONE) 25 MG tablet Take 0.5 tablets (12.5 mg total) by mouth daily.    . traMADol (ULTRAM) 50 MG tablet TAKE 1 TABLET BY MOUTH EVERY 6 HOURS AS NEEDED 60 tablet 1  . valACYclovir (VALTREX) 500 MG tablet Take 1 tablet by mouth as directed.    . venlafaxine XR (EFFEXOR-XR) 150 MG 24 hr capsule Take 150 mg by mouth daily with breakfast.   4  . zolpidem (AMBIEN) 10 MG tablet Take 10 mg by mouth at bedtime. For sleep     No current facility-administered medications for this visit.    Allergies:   No Known Allergies  Social History:  The patient  reports that she has been smoking Cigarettes.  She has a 15 pack-year smoking history. She has never used smokeless tobacco. She reports that she does not drink alcohol or use illicit drugs.   ROS:  Please see the history of present illness.   Positive for depression, cardiomyopathy. No syncope, no fevers, no chest pain, no significant shortness of breath    PHYSICAL EXAM: VS:  BP 118/64 mmHg  Pulse 78  Ht 5\' 3"  (1.6 m)  Wt 136 lb (61.689 kg)  BMI 24.10 kg/m2  SpO2 94%  thin,  in no acute distress HEENT: normal. Corner sewn right eye Neck: no JVD Cardiac:  normal S1, S2; RRR; no murmur Lungs:   clear to auscultation bilaterally, no wheezing, rhonchi or rales Abd: soft, nontender, no hepatomegaly Ext: no edema Skin: warm and dry Neuro: no focal abnormalities noted, question affects of current pain medication   EKG:  04/26/14 - Ventricular pacing, sinus rhythm, 87.     NUC stress 04/19/15:  Nuclear stress EF: 19%.  This is a high risk study.  Findings consistent with prior myocardial infarction.  The left  ventricular ejection fraction is severely decreased (<30%).  High risk stress nuclear study with large, severe, fixed anterior, septal, apical and distal inferior defect consistent with prior infarct; no ischemia; EF 19 with akinesis of the anteroseptal wall and apex; severe LVE; study high risk due to reduced LV function.  ASSESSMENT AND PLAN:  1. Preoperative risk assessment-her nuclear stress test is high risk, EF 19%, large myocardial infarction noted. Based upon these findings, she would be high risk for any surgical procedure. I would not recommend elective procedures. If she chooses to proceed, she understands that she is at high risk for cardiovascular morbidity/mortality. 2. Chronic systolic heart failure-currently well compensated. Ejection fraction of 19% range.  NYHA class II symptoms. BP previously in the 80s. R now improved since changing her medication dosage  3. Biventricular ICD-fairly well synchronized according to review of Dr. Olin Pia note. 4. Hypertension-well-controlled.  5. Tobacco use-encouraged cessation.  6. Trigeminal neuralgia-she is currently seeing a pain specialist. She asked about oxycodone. I defer to his best judgment on pain dosage and frequency. Obviously with her cardiomyopathy, any medication can be at higher risk for her.  7. 6 month follow up or sooner .  Signed, Candee Furbish, MD [facility]  04/19/2015 1:37 PM

## 2015-04-19 NOTE — Patient Instructions (Signed)

## 2015-04-19 NOTE — Patient Instructions (Signed)
Medication Instructions: 1) Decrease aspirin to 81 mg once daily  Labwork: - none  Procedures/Testing: - none  Follow-Up: - Remote monitoring is used to monitor your Pacemaker of ICD from home. This monitoring reduces the number of office visits required to check your device to one time per year. It allows [place] to keep an eye on the functioning of your device to ensure it is working properly. You are scheduled for a device check from home on 07/19/15. You may send your transmission at any time that day. If you have a wireless device, the transmission will be sent automatically. After your physician reviews your transmission, you will receive a postcard with your next transmission date.  - Your physician wants you to follow-up in: 1 year with Dr. Caryl Comes. You will receive a reminder letter in the mail two months in advance. If you don't receive a letter, please call our office to schedule the follow-up appointment.  Any Additional Special Instructions Will Be Listed Below (If Applicable).     If you need a refill on your cardiac medications before your next appointment, please call your pharmacy.

## 2015-04-25 ENCOUNTER — Encounter: Payer: Medicare Other | Attending: Physical Medicine & Rehabilitation | Admitting: Physical Medicine & Rehabilitation

## 2015-04-25 ENCOUNTER — Encounter: Payer: Self-pay | Admitting: Physical Medicine & Rehabilitation

## 2015-04-25 VITALS — BP 104/51 | HR 67 | Resp 14

## 2015-04-25 DIAGNOSIS — G500 Trigeminal neuralgia: Secondary | ICD-10-CM | POA: Diagnosis present

## 2015-04-25 DIAGNOSIS — M172 Bilateral post-traumatic osteoarthritis of knee: Secondary | ICD-10-CM | POA: Insufficient documentation

## 2015-04-25 DIAGNOSIS — M961 Postlaminectomy syndrome, not elsewhere classified: Secondary | ICD-10-CM | POA: Diagnosis not present

## 2015-04-25 DIAGNOSIS — M174 Other bilateral secondary osteoarthritis of knee: Secondary | ICD-10-CM | POA: Diagnosis not present

## 2015-04-25 DIAGNOSIS — Z5181 Encounter for therapeutic drug level monitoring: Secondary | ICD-10-CM | POA: Diagnosis not present

## 2015-04-25 DIAGNOSIS — G894 Chronic pain syndrome: Secondary | ICD-10-CM

## 2015-04-25 DIAGNOSIS — Z79899 Other long term (current) drug therapy: Secondary | ICD-10-CM

## 2015-04-25 MED ORDER — OXYCODONE HCL 20 MG PO TABS: 1.0000 | ORAL_TABLET | Freq: Four times a day (QID) | ORAL | Status: DC | PRN

## 2015-04-25 MED ORDER — DICLOFENAC SODIUM 1 % TD GEL: 1.0000 "application " | Freq: Three times a day (TID) | TRANSDERMAL | Status: DC

## 2015-04-25 NOTE — Progress Notes (Signed)
Subjective:    Patient ID: [name], female    DOB: November 23, 1945, 70 y.o.   MRN: [id number]  HPI   Aeriana is here in follow up of her chronic pain. She was seen at [facility] and ultimately it was felt after cardiology consideration that she wasn't a candidate for the DREZ procedure.   She is asking if she can have a few more oxycodone, which would allow her to have occasionally to have 4 pills on some days.   She would like the voltaren gel as well but her copay is quite high.    Pain Inventory Average Pain 4 Pain Right Now 4 My pain is intermittent, sharp, burning, stabbing and aching  In the last 24 hours, has pain interfered with the following? General activity 7 Relation with others 7 Enjoyment of life 10 What TIME of day is your pain at its worst? all Sleep (in general) Good  Pain is worse with: walking, bending, standing and some activites Pain improves with: rest, heat/ice and medication Relief from Meds: 4  Mobility walk without assistance how many minutes can you walk? 5-10 ability to climb steps?  yes do you drive?  yes Do you have any goals in this area?  no  Function retired I need assistance with the following:  meal prep, household duties and shopping Do you have any goals in this area?  no  Neuro/Psych weakness depression anxiety loss of taste or smell  Prior Studies Any changes since last visit?  no  Physicians involved in your care Any changes since last visit?  no   Family History  Problem Relation Age of Onset  . Heart disease Father    Social History   Social History  . Marital Status: Legally Separated    Spouse Name: N/A  . Number of Children: N/A  . Years of Education: N/A   Social History Main Topics  . Smoking status: Current Some Day Smoker -- 0.50 packs/day for 30 years    Types: Cigarettes  . Smokeless tobacco: Never Used  . Alcohol Use: No  . Drug Use: No  . Sexual Activity: No   Other Topics Concern  . None    Social History Narrative   Past Surgical History  Procedure Laterality Date  . Brain surgery      x3  . Appendectomy    . Joint replacement      thumbs and knees replacement  . Tubal ligation    . Back surgery      x2 back surgeries  . Eye surgery Right 05/24/12  . Bi-ventricular implantable cardioverter defibrillator  (crt-d)  2009; 05/2013    STJ CRTD generator change by Dr Caryl Comes 05/2013  . Biv pacemaker generator change out Bilateral 05/11/2013    Procedure: BIV PACEMAKER GENERATOR CHANGE OUT;  Surgeon: Deboraha Sprang, MD;  Location: [facility] CATH LAB;  Service: Cardiovascular;  Laterality: Bilateral;   Past Medical History  Diagnosis Date  . SOB (shortness of breath)   . CAD (coronary artery disease)     Prior LAD stenting with stent thrombosis occurring in a withdrawal phase of her Plavix undertaken because of trigeminal neuralgia and release surgery. It was treated medically.  . Anemia   . CHF (congestive heart failure) ([place])   . GERD (gastroesophageal reflux disease)   . Hyperlipidemia   . Osteoarthritis   . Ischemic cardiomyopathy   . Complication of anesthesia     massive heart attack  . Cancer ([place])  small bladder cancer removed no  treatment  . Cataract    BP 104/51 mmHg  Pulse 67  Resp 14  SpO2 97%  Opioid Risk Score:   Fall Risk Score:  `1  Depression screen PHQ 2/9  Depression screen PHQ 2/9 07/06/2014  Decreased Interest 0  Down, Depressed, Hopeless 0  PHQ - 2 Score 0     Review of Systems  All other systems reviewed and are negative.      Objective:   Physical Exam  Constitutional: generally alert. slow  HENT:  Head: Normocephalic and atraumatic.  Eyes: right eye appears irritated, lateral aspect sutured together  Neck: Normal range of motion. Neck supple.  Cardiovascular: Normal rate and regular rhythm.  Pulmonary/Chest: Effort normal. No respiratory distress.  Abdominal: She exhibits no distension.  Neurological: Balance better.  Continued facial hypersensitivity on the right. Strength near 5/5. Sensation grossly in tact in the limbs except for below the knees where her fine touch is decreased.  Musculoskeletal: Lumbar spine tender with hyperextension and side bending, especially on the right. She had difficulty moving to neutral from a flexed position. Hands and toes are generally tender to touch and show chronic arthritic signs/ sclerosis. She has pain with palpation between the 3rd and 4th MT's. 3rd MT also tender to palaption., pain also increases with weight bearing.  Psych: . Mood is cooperative. More alert today.     Assessment & Plan:   ASSESSMENT:  1. Right-sided trigeminal neuralgia.  2. Lumbar postlaminectomy syndrome.  3. Osteoarthritis of the knees.  4. Cardiomyopathy.  5. Weight loss  6. Peripheral neuropathy, risk factor for falls.  7. ?morton's neuroma left foot, OA of both feet    PLAN:  1. continue oxycodone 20mg . Will allow 1 tab q6-8 hours prn #100  2. Trileptal---continue 300mg  BID in addition to the gabapentin.  3. voltaren gel if it's available for facal pain. Gave her a copay coupon. 4. Pt not a candidate for DREZ likely due to CV issues.  5. My PA will see her back in about a month. 30 minutes of face to face patient care time were spent during this visit. All questions were encouraged and answered.

## 2015-04-25 NOTE — Patient Instructions (Signed)
  PLEASE CALL ME WITH ANY PROBLEMS OR QUESTIONS (#[phone]).      

## 2015-05-11 ENCOUNTER — Telehealth: Payer: Self-pay | Admitting: Physician Assistant

## 2015-05-11 ENCOUNTER — Other Ambulatory Visit: Payer: Self-pay | Admitting: Physician Assistant

## 2015-05-11 MED ORDER — ALBUTEROL SULFATE (2.5 MG/3ML) 0.083% IN NEBU: 2.5000 mg | INHALATION_SOLUTION | Freq: Four times a day (QID) | RESPIRATORY_TRACT | Status: DC | PRN

## 2015-05-11 NOTE — Telephone Encounter (Signed)
    Patient called because she was SOB and wanted a refill on her inhaler. I told her i was happy to do this, but i was concerned about CHF as she has an ischemic CM with EF 19%. She denies LE edema, worsening orthopnea or PND. She does not think she has gained any weight. She has not taken her lasix today. NO chest pain. I advised her to take her lasix and if the albuterol doesn't help that she may need to come to the hospital to be evaluated. She verbalized understanding.   Angelena Form PA-C  [facility]

## 2015-05-16 ENCOUNTER — Telehealth: Payer: Self-pay | Admitting: Cardiology

## 2015-05-16 NOTE — Telephone Encounter (Signed)
New Message   *STAT* If patient is at the pharmacy, call can be transferred to refill team.   1. Which medications need to be refilled? (please list name of each medication and dose if known)  albuterol (PROVENTIL) (2.5 MG/3ML) 0.083% nebulizer solution  2. Which pharmacy/location (including street and city if local pharmacy) is medication to be sent to? [facility] [phone]  3. Do they need a 30 day or 90 day supply? 90   Comments: When she went to pick it up it was a liquid for a nebulizer   Pt c/o Shortness Of Breath: STAT if SOB developed within the last 24 hours or pt is noticeably SOB on the phone  1. Are you currently SOB (can you hear that pt is SOB on the phone)? Yes  2. How long have you been experiencing SOB? 1 week for less. From last Friday  3. Are you SOB when sitting or when up moving around? Both Sitting if she is talking or eating  4.  Are you currently experiencing any other symptoms? No   Pt called originally for the albuterol (PROVENTIL) (2.5 MG/3ML) 0.083% nebulizer solution. She then stated that she was having SOB will route to Triage nurse first.

## 2015-05-16 NOTE — Telephone Encounter (Signed)
Attempted to reach patient - no answer.  (reviewed with [name] who ask that we discuss with [name], PA tomorrow when she returns to office.  Asked to call patient to inform her that office does not typically order/refill this medication and she should f/u w/ her PCP about taking this medication/symptoms)

## 2015-05-16 NOTE — Telephone Encounter (Signed)
Informed patient of what [name] recommended and that will address with [name] tomorrow morning. Patient verbalized understanding and agreeable to plan.

## 2015-05-17 DIAGNOSIS — H25812 Combined forms of age-related cataract, left eye: Secondary | ICD-10-CM | POA: Diagnosis not present

## 2015-05-17 DIAGNOSIS — H40013 Open angle with borderline findings, low risk, bilateral: Secondary | ICD-10-CM | POA: Diagnosis not present

## 2015-05-17 DIAGNOSIS — H401213 Low-tension glaucoma, right eye, severe stage: Secondary | ICD-10-CM | POA: Diagnosis not present

## 2015-05-17 DIAGNOSIS — Z961 Presence of intraocular lens: Secondary | ICD-10-CM | POA: Diagnosis not present

## 2015-05-17 DIAGNOSIS — H04123 Dry eye syndrome of bilateral lacrimal glands: Secondary | ICD-10-CM | POA: Diagnosis not present

## 2015-05-17 NOTE — Telephone Encounter (Signed)
Reviewed with [name] this morning.  Informed her of Sk[place]'s recommendations.   Informed patient that [name] is agreeable with Dr. Kingsley Plan and patient should contact PCP about problem and possible referral to pulmonology.   Patient understands to contact PCP for inhaler/appt. Patient is agreeable to plan and apologizes for "bothering [name]".  I told the patient she has no reason to apologize and that we are more than willing to see her if this issue is heart related but with description is sounds otherwise. I explained that she should always call the office if she has a concern and we will address.  She thanks me for helping and will call her PCP to be seen this week.

## 2015-05-25 ENCOUNTER — Encounter: Payer: Medicare Other | Attending: Physical Medicine & Rehabilitation | Admitting: Registered Nurse

## 2015-05-25 ENCOUNTER — Encounter: Payer: Self-pay | Admitting: Registered Nurse

## 2015-05-25 VITALS — BP 98/57 | HR 79 | Resp 14

## 2015-05-25 DIAGNOSIS — Z5181 Encounter for therapeutic drug level monitoring: Secondary | ICD-10-CM | POA: Diagnosis not present

## 2015-05-25 DIAGNOSIS — M961 Postlaminectomy syndrome, not elsewhere classified: Secondary | ICD-10-CM

## 2015-05-25 DIAGNOSIS — M172 Bilateral post-traumatic osteoarthritis of knee: Secondary | ICD-10-CM | POA: Insufficient documentation

## 2015-05-25 DIAGNOSIS — G500 Trigeminal neuralgia: Secondary | ICD-10-CM | POA: Insufficient documentation

## 2015-05-25 DIAGNOSIS — Z79899 Other long term (current) drug therapy: Secondary | ICD-10-CM | POA: Diagnosis not present

## 2015-05-25 DIAGNOSIS — G894 Chronic pain syndrome: Secondary | ICD-10-CM

## 2015-05-25 MED ORDER — OXYCODONE HCL 20 MG PO TABS: 1.0000 | ORAL_TABLET | Freq: Four times a day (QID) | ORAL | Status: DC | PRN

## 2015-05-25 NOTE — Progress Notes (Signed)
Subjective:    Patient ID: [name], female    DOB: Aug 25, 1945, 70 y.o.   MRN: [id number]  HPI: [name] is a 70 year old female who returns for follow up for chronic pain and medication refill. She states her pain is located in her right temporal pain and right knee pain She rates her pain 8.Her current exercise regime is walking in her home.  [name] would like for Dr. Naaman Plummer to give her a right knee injection next month, she states she will not be able to see Dr. Latanya Maudlin due to financial hardship. States " I can't afford all of these co-pays for specialities. Refuses X-ray due to financial hardship.  Pain Inventory Average Pain 7 Pain Right Now 8 My pain is intermittent, constant, sharp, burning, dull, stabbing and aching  In the last 24 hours, has pain interfered with the following? General activity 7 Relation with others 7 Enjoyment of life 9 What TIME of day is your pain at its worst? morning Sleep (in general) Good  Pain is worse with: walking, bending and standing Pain improves with: rest, heat/ice and medication Relief from Meds: 5  Mobility walk without assistance how many minutes can you walk? varies ability to climb steps?  yes do you drive?  yes Do you have any goals in this area?  no  Function retired I need assistance with the following:  meal prep, household duties and shopping Do you have any goals in this area?  no  Neuro/Psych weakness trouble walking depression anxiety loss of taste or smell  Prior Studies Any changes since last visit?  no  Physicians involved in your care Any changes since last visit?  no   Family History  Problem Relation Age of Onset  . Heart disease Father    Social History   Social History  . Marital Status: Legally Separated    Spouse Name: N/A  . Number of Children: N/A  . Years of Education: N/A   Social History Main Topics  . Smoking status: Current Some Day Smoker -- 0.50 packs/day  for 30 years    Types: Cigarettes  . Smokeless tobacco: Never Used  . Alcohol Use: No  . Drug Use: No  . Sexual Activity: No   Other Topics Concern  . None   Social History Narrative   Past Surgical History  Procedure Laterality Date  . Brain surgery      x3  . Appendectomy    . Joint replacement      thumbs and knees replacement  . Tubal ligation    . Back surgery      x2 back surgeries  . Eye surgery Right 05/24/12  . Bi-ventricular implantable cardioverter defibrillator  (crt-d)  2009; 05/2013    STJ CRTD generator change by Dr Caryl Comes 05/2013  . Biv pacemaker generator change out Bilateral 05/11/2013    Procedure: BIV PACEMAKER GENERATOR CHANGE OUT;  Surgeon: Deboraha Sprang, MD;  Location: [facility] CATH LAB;  Service: Cardiovascular;  Laterality: Bilateral;   Past Medical History  Diagnosis Date  . SOB (shortness of breath)   . CAD (coronary artery disease)     Prior LAD stenting with stent thrombosis occurring in a withdrawal phase of her Plavix undertaken because of trigeminal neuralgia and release surgery. It was treated medically.  . Anemia   . CHF (congestive heart failure) ([place])   . GERD (gastroesophageal reflux disease)   . Hyperlipidemia   . Osteoarthritis   .  Ischemic cardiomyopathy   . Complication of anesthesia     massive heart attack  . Cancer ([place])     small bladder cancer removed no  treatment  . Cataract    BP 98/57 mmHg  Pulse 79  Resp 14  SpO2 96%  Opioid Risk Score:   Fall Risk Score:  `1  Depression screen PHQ 2/9  Depression screen PHQ 2/9 07/06/2014  Decreased Interest 0  Down, Depressed, Hopeless 0  PHQ - 2 Score 0     Review of Systems  Constitutional:       Loss of taste or smell  Musculoskeletal: Positive for gait problem.  Neurological: Positive for weakness.  Psychiatric/Behavioral: Positive for dysphoric mood. The patient is nervous/anxious.   All other systems reviewed and are negative.      Objective:   Physical Exam    Constitutional: She is oriented to person, place, and time. She appears well-developed and well-nourished.  HENT:  Head: Normocephalic and atraumatic.  Right Temporal Tenderness  Neck: Normal range of motion. Neck supple.  Cardiovascular: Normal rate and regular rhythm.   Pulmonary/Chest: Effort normal and breath sounds normal.  Musculoskeletal:  Normal Muscle Bulk and Muscle Testing Reveals: Upper Extremities: Full ROM and Muscle Street 5/5 Lower Extremities: Full ROM and Muscle Strength 5/5 Right Lower Extremity Flexion Produces Pain into Patella Arises from chair slowly Narrow Based Gait  Neurological: She is alert and oriented to person, place, and time.  Skin: Skin is warm and dry.  Psychiatric: She has a normal mood and affect.  Nursing note and vitals reviewed.         Assessment & Plan:  1. Right sided trigeminal neuralgia: Refilled: oxyCODONE 20 mg one tablet every 6- 8 hours as needed # 100 tablets.  2. Lumbar postlaminectomy syndrome.: Stable at this time.  3. Osteoarthritis of the knees: Right Knee Pain/ Scheduled for Right Knee Injection with Dr. Naaman Plummer. Continue with Voltaren gel  4. Morton's Neuroma: No complaints: F/U with Podiatrist Dr. Paulla Dolly.  20 minutes of face to face patient care time was spent during this visit. All questions were encouraged and answered

## 2015-05-31 LAB — TOXASSURE SELECT,+ANTIDEPR,UR: PDF: 0

## 2015-05-31 NOTE — Progress Notes (Signed)
Urine drug screen for this encounter is inconsistent for prescribed medication.  [name] is prescribed oxycodone which is absent and she is positive for hydrocodone which she is not prescribed in last 2 years per [place]. She takes tranxene which accounts for the oxazepam in the urine.

## 2015-06-01 ENCOUNTER — Telehealth: Payer: Self-pay | Admitting: *Deleted

## 2015-06-01 NOTE — Telephone Encounter (Signed)
error 

## 2015-06-04 ENCOUNTER — Other Ambulatory Visit: Payer: Self-pay | Admitting: *Deleted

## 2015-06-04 MED ORDER — OXYCODONE HCL 20 MG PO TABS: 1.0000 | ORAL_TABLET | Freq: Four times a day (QID) | ORAL | Status: DC | PRN

## 2015-06-04 MED ORDER — TRAMADOL HCL 50 MG PO TABS: 50.0000 mg | ORAL_TABLET | Freq: Four times a day (QID) | ORAL | Status: DC | PRN

## 2015-06-04 NOTE — Progress Notes (Signed)
Per Dr Guinevere Scarlet is to be discharged.  This is her second offense taking someone else's medication. A letter will be mailed.

## 2015-06-08 ENCOUNTER — Telehealth: Payer: Self-pay | Admitting: Physical Medicine & Rehabilitation

## 2015-06-08 NOTE — Telephone Encounter (Signed)
Patient would like to discuss why she was discharged from practice.  Would like for Dr. Naaman Plummer to call her, and I explained to her that he is the one that makes those decisions on discharge patients.  And also told her he would not be back into our office until 3/20.

## 2015-06-11 NOTE — Telephone Encounter (Signed)
I spoke with Marilena and explained discharge.

## 2015-06-12 ENCOUNTER — Encounter: Payer: Medicare Other | Admitting: Internal Medicine

## 2015-06-13 DIAGNOSIS — G500 Trigeminal neuralgia: Secondary | ICD-10-CM | POA: Diagnosis not present

## 2015-06-15 ENCOUNTER — Other Ambulatory Visit (INDEPENDENT_AMBULATORY_CARE_PROVIDER_SITE_OTHER): Payer: Medicare Other

## 2015-06-15 DIAGNOSIS — E890 Postprocedural hypothyroidism: Secondary | ICD-10-CM

## 2015-06-18 LAB — TSH: TSH: 2.01 u[IU]/mL (ref 0.35–4.50)

## 2015-06-18 LAB — T4, FREE: Free T4: 1.02 ng/dL (ref 0.60–1.60)

## 2015-06-19 ENCOUNTER — Ambulatory Visit: Payer: Medicare Other | Admitting: Physical Medicine & Rehabilitation

## 2015-06-20 ENCOUNTER — Ambulatory Visit (INDEPENDENT_AMBULATORY_CARE_PROVIDER_SITE_OTHER): Payer: Medicare Other | Admitting: Endocrinology

## 2015-06-20 ENCOUNTER — Encounter: Payer: Self-pay | Admitting: Endocrinology

## 2015-06-20 VITALS — BP 110/66 | HR 79 | Temp 98.0°F | Resp 14 | Ht 63.0 in | Wt 141.0 lb

## 2015-06-20 DIAGNOSIS — E890 Postprocedural hypothyroidism: Secondary | ICD-10-CM

## 2015-06-20 NOTE — Progress Notes (Signed)
Patient ID: [name], female   DOB: September 11, 1945, 70 y.o.   MRN: [id number]    Reason for Appointment:  Post ablative hypothyroidism, followup  History of Present Illness:    She became hypothyroid in 03/2012 following radioactive iodine treatment for hyperthyroidism in 11/13  She was initially started with 75 mcg of levothyroxine but with her TSH being nearly 50 in 3/14 the dose was increased to 137 mcg Subsequently has required relatively lower doses   Usually she tends to have chronic fatigue regardless of her thyroid levels   She had been losing weight progressively in 2016 but now has continued to gained back  She thinks that she may have a little improvement in her energy after her dosage increased to 112 g in 12/16   She has mild chronic cold intolerance and normally keeps her house warm because of her trigeminal neuralgia   She is quite compliant with taking her medication daily, usually takes it after breakfast for better tolerability She takes her iron in the evening     Wt Readings from Last 3 Encounters:  06/20/15 141 lb (63.957 kg)  04/19/15 136 lb (61.689 kg)  04/19/15 136 lb (61.689 kg)   Labs:  Lab Results  Component Value Date   TSH 2.01 06/15/2015   TSH 5.66* 03/05/2015   TSH 0.56 11/01/2014   FREET4 1.02 06/15/2015   FREET4 0.77 03/05/2015   FREET4 1.26 11/01/2014         Medication List       This list is accurate as of: 06/20/15  5:12 PM.  Always use your most recent med list.               albuterol (2.5 MG/3ML) 0.083% nebulizer solution  Commonly known as:  PROVENTIL  Take 3 mLs (2.5 mg total) by nebulization every 6 (six) hours as needed for wheezing or shortness of breath.     aspirin EC 81 MG tablet  Take 1 tablet (81 mg total) by mouth daily.     carvedilol 6.25 MG tablet  Commonly known as:  COREG     clorazepate 7.5 MG tablet  Commonly known as:  TRANXENE  Take 7.5 mg by mouth at bedtime.     cyclobenzaprine 5 MG tablet  Commonly known as:  FLEXERIL  TAKE 1 TABLET BY MOUTH 3 TIMES A DAY AS NEEDED FOR MUSCLE SPASMS     diclofenac sodium 1 % Gel  Commonly known as:  VOLTAREN  Apply 1 application topically 3 (three) times daily. Apply to knees and hands     furosemide 80 MG tablet  Commonly known as:  LASIX  Take 1 tablet (80 mg total) by mouth daily.     gabapentin 800 MG tablet  Commonly known as:  NEURONTIN  Take 800 mg by mouth 4 (four) times daily as needed. For pain     iron polysaccharides 150 MG capsule  Commonly known as:  NIFEREX  Take 150 mg by mouth 2 (two) times daily.     levothyroxine 112 MCG tablet  Commonly known as:  SYNTHROID, LEVOTHROID  Take 1 tablet (112 mcg total) by mouth daily.     mirtazapine 30 MG tablet  Commonly known as:  REMERON  Take 30 mg by mouth at bedtime.     nitroGLYCERIN 0.4 MG SL tablet  Commonly known as:  NITROSTAT  TAKE 1 TABLET AS DIRECTED SUBLINGUALLY AS DIRECTED FOR CHEST PAIN     Oxcarbazepine 300 MG  tablet  Commonly known as:  TRILEPTAL  TAKE 1 TABLET (300 MG TOTAL) BY MOUTH 2 (TWO) TIMES DAILY.     Oxycodone HCl 20 MG Tabs  Take 1 tablet (20 mg total) by mouth every 6 (six) hours as needed (pain).     potassium chloride SA 20 MEQ tablet  Commonly known as:  KLOR-CON M20  Take 1 tablet (20 mEq total) by mouth daily.     prednisoLONE acetate 1 % ophthalmic suspension  Commonly known as:  PRED FORTE  Place 1 drop into both eyes every 4 (four) hours.     promethazine 25 MG tablet  Commonly known as:  PHENERGAN  Take 1 tablet (25 mg total) by mouth every 6 (six) hours as needed. for nasuea     ramipril 2.5 MG capsule  Commonly known as:  ALTACE  TAKE 1 CAPSULE (2.5 MG TOTAL) BY MOUTH DAILY. DAILY     REFRESH TEARS 0.5 % Soln  Generic drug:  carboxymethylcellulose  Place 1 drop into both eyes daily.     simvastatin 10 MG tablet  Commonly known as:  ZOCOR  TAKE 1 TABLET BY MOUTH EVERY DAY      spironolactone 25 MG tablet  Commonly known as:  ALDACTONE  Take 0.5 tablets (12.5 mg total) by mouth daily.     traMADol 50 MG tablet  Commonly known as:  ULTRAM  Take 1 tablet (50 mg total) by mouth every 6 (six) hours as needed.     valACYclovir 500 MG tablet  Commonly known as:  VALTREX  Take 1 tablet by mouth as directed.     venlafaxine XR 150 MG 24 hr capsule  Commonly known as:  EFFEXOR-XR  Take 150 mg by mouth daily with breakfast.     zolpidem 10 MG tablet  Commonly known as:  AMBIEN  Take 10 mg by mouth at bedtime. For sleep            Past Medical History  Diagnosis Date  . SOB (shortness of breath)   . CAD (coronary artery disease)     Prior LAD stenting with stent thrombosis occurring in a withdrawal phase of her Plavix undertaken because of trigeminal neuralgia and release surgery. It was treated medically.  . Anemia   . CHF (congestive heart failure) ([place])   . GERD (gastroesophageal reflux disease)   . Hyperlipidemia   . Osteoarthritis   . Ischemic cardiomyopathy   . Complication of anesthesia     massive heart attack  . Cancer ([place])     small bladder cancer removed no  treatment  . Cataract     Past Surgical History  Procedure Laterality Date  . Brain surgery      x3  . Appendectomy    . Joint replacement      thumbs and knees replacement  . Tubal ligation    . Back surgery      x2 back surgeries  . Eye surgery Right 05/24/12  . Bi-ventricular implantable cardioverter defibrillator  (crt-d)  2009; 05/2013    STJ CRTD generator change by Dr Caryl Comes 05/2013  . Biv pacemaker generator change out Bilateral 05/11/2013    Procedure: BIV PACEMAKER GENERATOR CHANGE OUT;  Surgeon: Deboraha Sprang, MD;  Location: [facility] CATH LAB;  Service: Cardiovascular;  Laterality: Bilateral;    Family History  Problem Relation Age of Onset  . Heart disease Father     Social History:  reports that she has been smoking Cigarettes.  She  has a 15 pack-year smoking history.  She has never used smokeless tobacco. She reports that she does not drink alcohol or use illicit drugs.  Allergies: No Known Allergies  Review of Systems:  CARDIOLOGY: she has history of coronary artery disease with multiple events and interventions She has had systolic congestive heart failure  History of trigeminal neuralgia .           Examination:   BP 110/66 mmHg  Pulse 79  Temp(Src) 98 F (36.7 C)  Resp 14  Ht 5\' 3"  (1.6 m)  Wt 141 lb (63.957 kg)  BMI 24.98 kg/m2  SpO2 97%  She looks well   REFLEXES: at biceps are normal   Neck exam normal. No peripheral edema    Assessment   Post ablative hypothyroidism:   Her TSH is now back to normal Although she tends to have fatigue she thinks she feels a little better since her last visit with the increase in dose Also her weight continues to go up and may be causing are relatively higher requirement for levothyroxine.  For now will continue on 112 g in follow-up in 4 months    Alandria Butkiewicz 06/20/2015, 5:12 PM

## 2015-06-22 ENCOUNTER — Ambulatory Visit: Payer: Medicare Other | Admitting: Registered Nurse

## 2015-07-09 ENCOUNTER — Telehealth: Payer: Self-pay | Admitting: *Deleted

## 2015-07-09 NOTE — Telephone Encounter (Signed)
Noted patient is aware 

## 2015-07-09 NOTE — Telephone Encounter (Signed)
Pt called stating that she is gaining weight while on the levothyroxine. She is concerned over this and would like for the nurse or Dr Dwyane Dee to call her to discuss this. Be advised.

## 2015-07-09 NOTE — Telephone Encounter (Signed)
Please see below.

## 2015-07-09 NOTE — Telephone Encounter (Signed)
Weight gain is from Remeron/mirtazapine, not thyroid medication

## 2015-07-19 ENCOUNTER — Encounter: Payer: Medicare Other | Admitting: *Deleted

## 2015-07-19 ENCOUNTER — Telehealth: Payer: Self-pay | Admitting: Cardiology

## 2015-07-19 NOTE — Telephone Encounter (Signed)
Spoke with pt and reminded pt of remote transmission that is due today. Pt verbalized understanding.   

## 2015-07-20 ENCOUNTER — Encounter: Payer: Self-pay | Admitting: Cardiology

## 2015-07-24 DIAGNOSIS — F17210 Nicotine dependence, cigarettes, uncomplicated: Secondary | ICD-10-CM | POA: Diagnosis not present

## 2015-07-24 DIAGNOSIS — Z95810 Presence of automatic (implantable) cardiac defibrillator: Secondary | ICD-10-CM | POA: Diagnosis not present

## 2015-07-24 DIAGNOSIS — G629 Polyneuropathy, unspecified: Secondary | ICD-10-CM | POA: Diagnosis not present

## 2015-07-24 DIAGNOSIS — I509 Heart failure, unspecified: Secondary | ICD-10-CM | POA: Diagnosis not present

## 2015-07-24 DIAGNOSIS — I252 Old myocardial infarction: Secondary | ICD-10-CM | POA: Diagnosis not present

## 2015-07-24 DIAGNOSIS — Z79891 Long term (current) use of opiate analgesic: Secondary | ICD-10-CM | POA: Diagnosis not present

## 2015-07-24 DIAGNOSIS — Z79899 Other long term (current) drug therapy: Secondary | ICD-10-CM | POA: Diagnosis not present

## 2015-07-24 DIAGNOSIS — G500 Trigeminal neuralgia: Secondary | ICD-10-CM | POA: Diagnosis not present

## 2015-07-24 DIAGNOSIS — R51 Headache: Secondary | ICD-10-CM | POA: Diagnosis not present

## 2015-07-26 ENCOUNTER — Other Ambulatory Visit: Payer: Self-pay | Admitting: *Deleted

## 2015-07-26 MED ORDER — LEVOTHYROXINE SODIUM 112 MCG PO TABS: 112.0000 ug | ORAL_TABLET | Freq: Every day | ORAL | Status: DC

## 2015-07-30 ENCOUNTER — Ambulatory Visit (INDEPENDENT_AMBULATORY_CARE_PROVIDER_SITE_OTHER): Payer: Medicare Other | Admitting: *Deleted

## 2015-07-30 ENCOUNTER — Telehealth: Payer: Self-pay | Admitting: Internal Medicine

## 2015-07-30 DIAGNOSIS — I5022 Chronic systolic (congestive) heart failure: Secondary | ICD-10-CM | POA: Diagnosis not present

## 2015-07-30 DIAGNOSIS — I255 Ischemic cardiomyopathy: Secondary | ICD-10-CM

## 2015-07-30 NOTE — Telephone Encounter (Signed)
Spoke w/ pt and she stated that she would send a manual transmission today. Informed pt that this is ok.

## 2015-07-30 NOTE — Telephone Encounter (Signed)
NewMessage  Pt calling to discuss remote check- stated that on scheduled date 4/20- she received new monitor- Pt wanted to know what to do going forward. Please call back and discuss.

## 2015-07-31 NOTE — Progress Notes (Signed)
Remote ICD transmission.   

## 2015-08-01 ENCOUNTER — Other Ambulatory Visit: Payer: Self-pay

## 2015-08-01 MED ORDER — POTASSIUM CHLORIDE CRYS ER 20 MEQ PO TBCR: 20.0000 meq | EXTENDED_RELEASE_TABLET | Freq: Every day | ORAL | Status: DC

## 2015-08-09 DIAGNOSIS — H353131 Nonexudative age-related macular degeneration, bilateral, early dry stage: Secondary | ICD-10-CM | POA: Diagnosis not present

## 2015-08-09 DIAGNOSIS — Z961 Presence of intraocular lens: Secondary | ICD-10-CM | POA: Diagnosis not present

## 2015-08-09 DIAGNOSIS — H04123 Dry eye syndrome of bilateral lacrimal glands: Secondary | ICD-10-CM | POA: Diagnosis not present

## 2015-08-09 DIAGNOSIS — H40013 Open angle with borderline findings, low risk, bilateral: Secondary | ICD-10-CM | POA: Diagnosis not present

## 2015-08-09 DIAGNOSIS — H353121 Nonexudative age-related macular degeneration, left eye, early dry stage: Secondary | ICD-10-CM | POA: Diagnosis not present

## 2015-08-23 DIAGNOSIS — G894 Chronic pain syndrome: Secondary | ICD-10-CM | POA: Diagnosis not present

## 2015-08-23 DIAGNOSIS — Z79891 Long term (current) use of opiate analgesic: Secondary | ICD-10-CM | POA: Diagnosis not present

## 2015-08-23 DIAGNOSIS — Z79899 Other long term (current) drug therapy: Secondary | ICD-10-CM | POA: Diagnosis not present

## 2015-08-23 DIAGNOSIS — F339 Major depressive disorder, recurrent, unspecified: Secondary | ICD-10-CM | POA: Diagnosis not present

## 2015-08-23 DIAGNOSIS — G500 Trigeminal neuralgia: Secondary | ICD-10-CM | POA: Diagnosis not present

## 2015-08-23 DIAGNOSIS — R51 Headache: Secondary | ICD-10-CM | POA: Diagnosis not present

## 2015-09-06 DIAGNOSIS — G894 Chronic pain syndrome: Secondary | ICD-10-CM | POA: Diagnosis not present

## 2015-09-06 DIAGNOSIS — Z79899 Other long term (current) drug therapy: Secondary | ICD-10-CM | POA: Diagnosis not present

## 2015-09-06 DIAGNOSIS — G500 Trigeminal neuralgia: Secondary | ICD-10-CM | POA: Diagnosis not present

## 2015-09-06 DIAGNOSIS — Z79891 Long term (current) use of opiate analgesic: Secondary | ICD-10-CM | POA: Diagnosis not present

## 2015-09-07 ENCOUNTER — Encounter: Payer: Self-pay | Admitting: Cardiology

## 2015-09-07 ENCOUNTER — Telehealth: Payer: Self-pay | Admitting: Physician Assistant

## 2015-09-07 NOTE — Telephone Encounter (Signed)
    Patient had some chest pain that radiated into her neck and back that lasted about 15 minutes. This was similar to her previous angina prior to stent placement. She took SL NTG x2 with relief. I advised her that she probably ought to come to the ER to be seen. She did not really want to come in at this time. Since she is pain free now, I told her we could probably continue to monitor and she could be seen in the office early next week. However, if she has anymore episodes between now and then, she will go to the ER immediately. Patient agreeable to plan.  Angelena Form PA-C  [facility]

## 2015-09-09 LAB — CUP PACEART REMOTE DEVICE CHECK
Brady Statistic RA Percent Paced: 12 %
Brady Statistic RV Percent Paced: 93 %
Date Time Interrogation Session: 20170611174638
HighPow Impedance: 40 Ohm
Implantable Lead Location: 753859
Lead Channel Impedance Value: 350 Ohm
Lead Channel Impedance Value: 440 Ohm
Lead Channel Setting Pacing Amplitude: 2 V
Lead Channel Setting Pacing Amplitude: 2 V
Lead Channel Setting Pacing Pulse Width: 0.5 ms
MDC IDC LEAD IMPLANT DT: 20090429
MDC IDC LEAD IMPLANT DT: 20090429
MDC IDC LEAD IMPLANT DT: 20090429
MDC IDC LEAD LOCATION: 753858
MDC IDC LEAD LOCATION: 753860
MDC IDC LEAD MODEL: 7120
MDC IDC MSMT BATTERY REMAINING LONGEVITY: 45 mo
MDC IDC MSMT BATTERY REMAINING PERCENTAGE: 66 %
MDC IDC MSMT LEADCHNL RA IMPEDANCE VALUE: 300 Ohm
MDC IDC MSMT LEADCHNL RA SENSING INTR AMPL: 1.5 mV
MDC IDC MSMT LEADCHNL RV SENSING INTR AMPL: 12 mV
MDC IDC SET LEADCHNL RV PACING AMPLITUDE: 2.5 V
MDC IDC SET LEADCHNL RV PACING PULSEWIDTH: 1 ms
MDC IDC SET LEADCHNL RV SENSING SENSITIVITY: 0.5 mV
Pulse Gen Serial Number: 7170199

## 2015-09-12 ENCOUNTER — Other Ambulatory Visit: Payer: Self-pay | Admitting: *Deleted

## 2015-09-12 MED ORDER — FUROSEMIDE 80 MG PO TABS: 80.0000 mg | ORAL_TABLET | Freq: Every day | ORAL | Status: DC

## 2015-09-12 MED ORDER — RAMIPRIL 2.5 MG PO CAPS: ORAL_CAPSULE | ORAL | Status: DC

## 2015-09-12 NOTE — Telephone Encounter (Signed)
Pharmacy requesting a refill on carvedilol 6.25 mg bid. Patients snapshot reflects this dose, but her last office visit here has 3.125 mg bid listed. At office visit with Dr Dwyane Dee, the 3.125 mg dose is listed under discontinued medication list. Please advise. Thanks, MI

## 2015-09-13 DIAGNOSIS — G500 Trigeminal neuralgia: Secondary | ICD-10-CM | POA: Diagnosis not present

## 2015-09-13 DIAGNOSIS — R51 Headache: Secondary | ICD-10-CM | POA: Diagnosis not present

## 2015-09-13 MED ORDER — CARVEDILOL 3.125 MG PO TABS: 3.1250 mg | ORAL_TABLET | Freq: Two times a day (BID) | ORAL | Status: DC

## 2015-09-17 DIAGNOSIS — G44091 Other trigeminal autonomic cephalgias (TAC), intractable: Secondary | ICD-10-CM | POA: Diagnosis not present

## 2015-09-20 DIAGNOSIS — R51 Headache: Secondary | ICD-10-CM | POA: Diagnosis not present

## 2015-09-20 DIAGNOSIS — G500 Trigeminal neuralgia: Secondary | ICD-10-CM | POA: Diagnosis not present

## 2015-09-20 DIAGNOSIS — G894 Chronic pain syndrome: Secondary | ICD-10-CM | POA: Diagnosis not present

## 2015-10-05 ENCOUNTER — Other Ambulatory Visit: Payer: Self-pay

## 2015-10-12 ENCOUNTER — Other Ambulatory Visit: Payer: Self-pay | Admitting: Cardiology

## 2015-10-18 DIAGNOSIS — G44091 Other trigeminal autonomic cephalgias (TAC), intractable: Secondary | ICD-10-CM | POA: Diagnosis not present

## 2015-10-19 ENCOUNTER — Telehealth: Payer: Self-pay | Admitting: *Deleted

## 2015-10-19 NOTE — Telephone Encounter (Signed)
Pt states her foot hurts, and she can't come in until Wednesday due to lack of co-pay.  I told pt I would transfer her and they could get her in on Wednesday.

## 2015-10-22 ENCOUNTER — Ambulatory Visit (INDEPENDENT_AMBULATORY_CARE_PROVIDER_SITE_OTHER): Payer: Medicare Other

## 2015-10-22 ENCOUNTER — Ambulatory Visit (INDEPENDENT_AMBULATORY_CARE_PROVIDER_SITE_OTHER): Payer: Medicare Other | Admitting: Podiatry

## 2015-10-22 ENCOUNTER — Other Ambulatory Visit: Payer: Medicare Other

## 2015-10-22 VITALS — BP 107/59 | HR 77

## 2015-10-22 DIAGNOSIS — M2042 Other hammer toe(s) (acquired), left foot: Secondary | ICD-10-CM

## 2015-10-22 DIAGNOSIS — M79672 Pain in left foot: Secondary | ICD-10-CM | POA: Diagnosis not present

## 2015-10-22 DIAGNOSIS — M779 Enthesopathy, unspecified: Secondary | ICD-10-CM

## 2015-10-22 MED ORDER — TRIAMCINOLONE ACETONIDE 10 MG/ML IJ SUSP
10.0000 mg | Freq: Once | INTRAMUSCULAR | Status: AC
  Administered 2015-10-22: 10 mg

## 2015-10-24 NOTE — Progress Notes (Signed)
Subjective:     Patient ID: [name], female   DOB: 1945/04/16, 70 y.o.   MRN: [id number]  HPI patient presents with pain in the second metatarsal left and mild in the third with inflammation fluid buildup and is also noted to have digital deformity of the left second toe   Review of Systems  All other systems reviewed and are negative.      Objective:   Physical Exam  Cardiovascular: Intact distal pulses.   Musculoskeletal: Normal range of motion.  Skin: Skin is warm.  Nursing note and vitals reviewed.  neurovascular status found to be intact muscle strength adequate range of motion within normal limits with inflammation fluid around the second metatarsophalangeal joint left that is painful when pressed with patient noted to have good digital perfusion and well oriented 3     Assessment:     Inflammatory capsulitis second MPJ left with pain    Plan:     H&P condition reviewed and recommended drainage of the joint. I went ahead and aspirated the joint getting out a small amount of clear fluid and injected with a quarter cc deck Smith some Kenalog and applied thick padding to reduce pressure on the joint surface  X-ray report negative for signs stress fracture or advanced arthritis with digital deformity noted and explained digital deformity and possibility for digital stabilization procedure

## 2015-10-25 ENCOUNTER — Ambulatory Visit: Payer: Medicare Other | Admitting: Endocrinology

## 2015-10-29 ENCOUNTER — Encounter (INDEPENDENT_AMBULATORY_CARE_PROVIDER_SITE_OTHER): Payer: Self-pay

## 2015-10-29 ENCOUNTER — Ambulatory Visit (INDEPENDENT_AMBULATORY_CARE_PROVIDER_SITE_OTHER): Payer: Medicare Other | Admitting: Cardiology

## 2015-10-29 ENCOUNTER — Encounter: Payer: Self-pay | Admitting: Cardiology

## 2015-10-29 VITALS — BP 130/72 | HR 80 | Ht 63.0 in | Wt 151.0 lb

## 2015-10-29 DIAGNOSIS — E785 Hyperlipidemia, unspecified: Secondary | ICD-10-CM

## 2015-10-29 DIAGNOSIS — I255 Ischemic cardiomyopathy: Secondary | ICD-10-CM

## 2015-10-29 DIAGNOSIS — Z720 Tobacco use: Secondary | ICD-10-CM

## 2015-10-29 DIAGNOSIS — I5022 Chronic systolic (congestive) heart failure: Secondary | ICD-10-CM | POA: Diagnosis not present

## 2015-10-29 DIAGNOSIS — Z95810 Presence of automatic (implantable) cardiac defibrillator: Secondary | ICD-10-CM

## 2015-10-29 NOTE — Progress Notes (Signed)
[facility] [address], [address] [place], [place]  [address] Phone: [phone] Fax:  [phone]  Date:  10/29/2015   ID:  [name], DOB [birth year], MRN [id number]  PCP:  Shirline Frees, MD   History of Present Illness: [name] is a 70 y.o. female with ischemic cardiomyopathy ejection fraction in the 15-20% range, Ischemic cardiomyopathy status post Bi-V pacemaker, coronary artery disease with bare-metal stent implantation for in-stent restenosis LAD.  Her main issue today is discussion of trigeminal neuralgia and potential upcoming surgical procedure at [facility]. She underwent a nuclear stress test  which was high risk, EF 19%, large area of infarct.  [place] [facility], fell going down step. She also about 2 weeks ago  7 chest discomfort that was quite severe and to 2 nitroglycerin. This subsided. She's no longer having any discomfort.  Dr. Caryl Comes  provided her with AV optimization of her device. She was told that her device at baseline was fairly synchronized.  She is battled with long-standing depression.    In 2012, we had a long discussion about her cardiomyopathy and overall prognosis.   Sometimes she can feel shortness of breath when walking through her house. She is still smoking. Sometimes she is feeling somewhat dizzy, tired.  No chest pain.    Wt Readings from Last 3 Encounters:  10/29/15 151 lb (68.5 kg)  06/20/15 141 lb (64 kg)  04/19/15 136 lb (61.7 kg)     Past Medical History:  Diagnosis Date  . Anemia   . CAD (coronary artery disease)    Prior LAD stenting with stent thrombosis occurring in a withdrawal phase of her Plavix undertaken because of trigeminal neuralgia and release surgery. It was treated medically.  . Cancer ([place])    small bladder cancer removed no  treatment  . Cataract   . CHF (congestive heart failure) ([place])   . Complication of anesthesia    massive heart attack  . GERD (gastroesophageal reflux disease)   .  Hyperlipidemia   . Ischemic cardiomyopathy   . Osteoarthritis   . SOB (shortness of breath)     Past Surgical History:  Procedure Laterality Date  . APPENDECTOMY    . BACK SURGERY     x2 back surgeries  . BI-VENTRICULAR IMPLANTABLE CARDIOVERTER DEFIBRILLATOR  (CRT-D)  2009; 05/2013   STJ CRTD generator change by Dr Caryl Comes 05/2013  . BIV PACEMAKER GENERATOR CHANGE OUT Bilateral 05/11/2013   Procedure: BIV PACEMAKER GENERATOR CHANGE OUT;  Surgeon: Deboraha Sprang, MD;  Location: [facility] CATH LAB;  Service: Cardiovascular;  Laterality: Bilateral;  . BRAIN SURGERY     x3  . EYE SURGERY Right 05/24/12  . JOINT REPLACEMENT     thumbs and knees replacement  . TUBAL LIGATION      Current Outpatient Prescriptions  Medication Sig Dispense Refill  . aspirin EC 81 MG tablet Take 1 tablet (81 mg total) by mouth daily.    . carbamazepine (TEGRETOL) 100 MG chewable tablet Chew 100 mg by mouth 2 (two) times daily.   2  . carboxymethylcellulose (REFRESH TEARS) 0.5 % SOLN Place 1 drop into both eyes daily.     . carvedilol (COREG) 3.125 MG tablet Take 1 tablet (3.125 mg total) by mouth 2 (two) times daily with a meal. 60 tablet 11  . clorazepate (TRANXENE) 7.5 MG tablet Take 7.5 mg by mouth at bedtime.   4  . cyclobenzaprine (FLEXERIL) 5 MG tablet TAKE 1 TABLET  BY MOUTH 3 TIMES A DAY AS NEEDED FOR MUSCLE SPASMS  0  . diclofenac sodium (VOLTAREN) 1 % GEL Apply 1 application topically 3 (three) times daily. Apply to knees and hands 5 Tube 3  . furosemide (LASIX) 80 MG tablet Take 1 tablet (80 mg total) by mouth daily. 30 tablet 5  . gabapentin (NEURONTIN) 800 MG tablet Take 800 mg by mouth 4 (four) times daily as needed. For pain    . iron polysaccharides (NIFEREX) 150 MG capsule Take 150 mg by mouth 2 (two) times daily.    Marland Kitchen levothyroxine (SYNTHROID, LEVOTHROID) 112 MCG tablet Take 1 tablet (112 mcg total) by mouth daily. 30 tablet 3  . mirtazapine (REMERON) 30 MG tablet Take 30 mg by mouth at bedtime.  5  .  nitroGLYCERIN (NITROSTAT) 0.4 MG SL tablet TAKE 1 TABLET AS DIRECTED SUBLINGUALLY AS DIRECTED FOR CHEST PAIN 25 tablet 1  . Oxycodone HCl 20 MG TABS Take 1 tablet (20 mg total) by mouth every 6 (six) hours as needed (pain). 100 tablet 0  . potassium chloride SA (KLOR-CON M20) 20 MEQ tablet Take 1 tablet (20 mEq total) by mouth daily. 60 tablet 2  . promethazine (PHENERGAN) 25 MG tablet Take 1 tablet (25 mg total) by mouth every 6 (six) hours as needed. for nasuea 90 tablet 1  . ramipril (ALTACE) 2.5 MG capsule TAKE 1 CAPSULE (2.5 MG TOTAL) BY MOUTH DAILY. 90 capsule 1  . simvastatin (ZOCOR) 10 MG tablet TAKE ONE TABLET BY MOUTH EVERY DAY 90 tablet 1  . spironolactone (ALDACTONE) 25 MG tablet Take 0.5 tablets (12.5 mg total) by mouth daily.    . valACYclovir (VALTREX) 500 MG tablet Take 1 tablet by mouth as directed.    . venlafaxine XR (EFFEXOR-XR) 150 MG 24 hr capsule Take 150 mg by mouth daily with breakfast.   4  . zolpidem (AMBIEN) 10 MG tablet Take 10 mg by mouth at bedtime. For sleep     No current facility-administered medications for this visit.     Allergies:   No Known Allergies  Social History:  The patient  reports that she has been smoking Cigarettes.  She has a 15.00 pack-year smoking history. She has never used smokeless tobacco. She reports that she does not drink alcohol or use drugs.   ROS:  Please see the history of present illness.   Positive for depression, cardiomyopathy. No syncope, no fevers, no chest pain, no significant shortness of breath    PHYSICAL EXAM: VS:  BP 130/72   Pulse 80   Ht 5\' 3"  (1.6 m)   Wt 151 lb (68.5 kg)   BMI 26.75 kg/m   thin,  in no acute distress  HEENT: normal. Corner sewn right eye Neck: no JVD  Cardiac:  normal S1, S2; RRR; no murmur  Lungs:  clear to auscultation bilaterally, no wheezing, rhonchi or rales  Abd: soft, nontender, no hepatomegaly  Ext: no edema  Skin: warm and dry Bruises and scrapes noted from fall at pull, right  elbow worst. Neuro: no focal abnormalities noted, question affects of current pain medication   EKG:  04/26/14 - Ventricular pacing, sinus rhythm, 87.     NUC stress 04/19/15:  Nuclear stress EF: 19%.  This is a high risk study.  Findings consistent with prior myocardial infarction.  The left ventricular ejection fraction is severely decreased (<30%).  High risk stress nuclear study with large, severe, fixed anterior, septal, apical and distal inferior defect consistent with  prior infarct; no ischemia; EF 19 with akinesis of the anteroseptal wall and apex; severe LVE; study high risk due to reduced LV function.  ASSESSMENT AND PLAN:  1. Her nuclear stress test is high risk, EF 19%, large myocardial infarction noted. Based upon these findings, she would be high risk for any surgical procedure. I would not recommend elective procedures. If she chooses to proceed, she understands that she is at high risk for cardiovascular morbidity/mortality. 2. Fall at pull was mechanical. She did not significantly hit her head. She is now feeling better. 3. Chronic systolic heart failure-currently well compensated. Ejection fraction of 19% range.  NYHA class II symptoms. BP previously in the 80s.  now improved since changing her medication dosage. Still warn her about risks of driving because of her medications and risks to others on the road. 4. Biventricular ICD-fairly well synchronized according to review of Dr. Olin Pia note. 5. Hypertension-well-controlled.  6. Tobacco use-encouraged cessation. Albuterol 7. 6 month follow up or sooner .  Signed, Candee Furbish, MD [facility]  10/29/2015 2:26 PM

## 2015-10-29 NOTE — Patient Instructions (Signed)

## 2015-10-30 ENCOUNTER — Ambulatory Visit (INDEPENDENT_AMBULATORY_CARE_PROVIDER_SITE_OTHER): Payer: Medicare Other | Admitting: *Deleted

## 2015-10-30 DIAGNOSIS — Z95810 Presence of automatic (implantable) cardiac defibrillator: Secondary | ICD-10-CM

## 2015-10-30 DIAGNOSIS — I255 Ischemic cardiomyopathy: Secondary | ICD-10-CM | POA: Diagnosis not present

## 2015-10-30 DIAGNOSIS — I5022 Chronic systolic (congestive) heart failure: Secondary | ICD-10-CM

## 2015-10-30 NOTE — Progress Notes (Signed)
Remote ICD transmission.   

## 2015-11-02 DIAGNOSIS — G894 Chronic pain syndrome: Secondary | ICD-10-CM | POA: Diagnosis not present

## 2015-11-02 DIAGNOSIS — G500 Trigeminal neuralgia: Secondary | ICD-10-CM | POA: Diagnosis not present

## 2015-11-02 DIAGNOSIS — Z79891 Long term (current) use of opiate analgesic: Secondary | ICD-10-CM | POA: Diagnosis not present

## 2015-11-02 DIAGNOSIS — Z79899 Other long term (current) drug therapy: Secondary | ICD-10-CM | POA: Diagnosis not present

## 2015-11-02 DIAGNOSIS — R51 Headache: Secondary | ICD-10-CM | POA: Diagnosis not present

## 2015-11-06 ENCOUNTER — Encounter: Payer: Self-pay | Admitting: Cardiology

## 2015-11-06 NOTE — Progress Notes (Signed)
letter

## 2015-11-14 LAB — CUP PACEART REMOTE DEVICE CHECK
Brady Statistic AP VP Percent: 13 %
Brady Statistic AS VP Percent: 80 %
Brady Statistic AS VS Percent: 3 %
Brady Statistic RA Percent Paced: 11 %
Date Time Interrogation Session: 20170801060017
HighPow Impedance: 37 Ohm
HighPow Impedance: 37 Ohm
Implantable Lead Implant Date: 20090429
Implantable Lead Location: 753859
Lead Channel Impedance Value: 290 Ohm
Lead Channel Pacing Threshold Amplitude: 0.75 V
Lead Channel Pacing Threshold Amplitude: 1 V
Lead Channel Pacing Threshold Amplitude: 1.5 V
Lead Channel Pacing Threshold Pulse Width: 0.5 ms
Lead Channel Pacing Threshold Pulse Width: 1 ms
Lead Channel Sensing Intrinsic Amplitude: 1.5 mV
Lead Channel Setting Pacing Amplitude: 2 V
Lead Channel Setting Pacing Amplitude: 2.5 V
Lead Channel Setting Pacing Pulse Width: 0.5 ms
Lead Channel Setting Pacing Pulse Width: 1 ms
Lead Channel Setting Sensing Sensitivity: 0.5 mV
MDC IDC LEAD IMPLANT DT: 20090429
MDC IDC LEAD IMPLANT DT: 20090429
MDC IDC LEAD LOCATION: 753858
MDC IDC LEAD LOCATION: 753860
MDC IDC MSMT BATTERY REMAINING LONGEVITY: 44 mo
MDC IDC MSMT BATTERY REMAINING PERCENTAGE: 62 %
MDC IDC MSMT BATTERY VOLTAGE: 2.95 V
MDC IDC MSMT LEADCHNL LV IMPEDANCE VALUE: 410 Ohm
MDC IDC MSMT LEADCHNL RA PACING THRESHOLD PULSEWIDTH: 0.5 ms
MDC IDC MSMT LEADCHNL RV IMPEDANCE VALUE: 350 Ohm
MDC IDC MSMT LEADCHNL RV SENSING INTR AMPL: 12 mV
MDC IDC SET LEADCHNL LV PACING AMPLITUDE: 2 V
MDC IDC STAT BRADY AP VS PERCENT: 1 %
Pulse Gen Serial Number: 7170199

## 2015-11-15 DIAGNOSIS — G500 Trigeminal neuralgia: Secondary | ICD-10-CM | POA: Diagnosis not present

## 2015-11-15 DIAGNOSIS — F324 Major depressive disorder, single episode, in partial remission: Secondary | ICD-10-CM | POA: Diagnosis not present

## 2015-11-15 DIAGNOSIS — J438 Other emphysema: Secondary | ICD-10-CM | POA: Diagnosis not present

## 2015-11-15 DIAGNOSIS — E039 Hypothyroidism, unspecified: Secondary | ICD-10-CM | POA: Diagnosis not present

## 2015-11-15 DIAGNOSIS — I5022 Chronic systolic (congestive) heart failure: Secondary | ICD-10-CM | POA: Diagnosis not present

## 2015-11-15 DIAGNOSIS — F419 Anxiety disorder, unspecified: Secondary | ICD-10-CM | POA: Diagnosis not present

## 2015-11-15 DIAGNOSIS — E7800 Pure hypercholesterolemia, unspecified: Secondary | ICD-10-CM | POA: Diagnosis not present

## 2015-11-21 ENCOUNTER — Encounter: Payer: Medicare Other | Admitting: Endocrinology

## 2015-11-21 ENCOUNTER — Encounter: Payer: Self-pay | Admitting: Endocrinology

## 2015-11-21 DIAGNOSIS — E7800 Pure hypercholesterolemia, unspecified: Secondary | ICD-10-CM | POA: Diagnosis not present

## 2015-11-21 DIAGNOSIS — E039 Hypothyroidism, unspecified: Secondary | ICD-10-CM | POA: Diagnosis not present

## 2015-11-22 DIAGNOSIS — F339 Major depressive disorder, recurrent, unspecified: Secondary | ICD-10-CM | POA: Diagnosis not present

## 2015-11-28 ENCOUNTER — Ambulatory Visit: Payer: Medicare Other | Admitting: Endocrinology

## 2015-12-04 DIAGNOSIS — Z79899 Other long term (current) drug therapy: Secondary | ICD-10-CM | POA: Diagnosis not present

## 2015-12-04 DIAGNOSIS — G500 Trigeminal neuralgia: Secondary | ICD-10-CM | POA: Diagnosis not present

## 2015-12-04 DIAGNOSIS — G894 Chronic pain syndrome: Secondary | ICD-10-CM | POA: Diagnosis not present

## 2015-12-04 DIAGNOSIS — Z79891 Long term (current) use of opiate analgesic: Secondary | ICD-10-CM | POA: Diagnosis not present

## 2015-12-04 DIAGNOSIS — R51 Headache: Secondary | ICD-10-CM | POA: Diagnosis not present

## 2015-12-06 ENCOUNTER — Other Ambulatory Visit (INDEPENDENT_AMBULATORY_CARE_PROVIDER_SITE_OTHER): Payer: Medicare Other

## 2015-12-06 DIAGNOSIS — G44091 Other trigeminal autonomic cephalgias (TAC), intractable: Secondary | ICD-10-CM | POA: Diagnosis not present

## 2015-12-06 DIAGNOSIS — E890 Postprocedural hypothyroidism: Secondary | ICD-10-CM | POA: Diagnosis not present

## 2015-12-06 LAB — TSH: TSH: 0.3 u[IU]/mL — AB (ref 0.35–4.50)

## 2015-12-06 LAB — T4, FREE: Free T4: 0.75 ng/dL (ref 0.60–1.60)

## 2015-12-07 ENCOUNTER — Other Ambulatory Visit: Payer: Self-pay | Admitting: *Deleted

## 2015-12-07 MED ORDER — LEVOTHYROXINE SODIUM 112 MCG PO TABS: 112.0000 ug | ORAL_TABLET | Freq: Every day | ORAL | 3 refills | Status: DC

## 2015-12-10 ENCOUNTER — Ambulatory Visit: Payer: Medicare Other | Admitting: Endocrinology

## 2015-12-17 ENCOUNTER — Ambulatory Visit (INDEPENDENT_AMBULATORY_CARE_PROVIDER_SITE_OTHER): Payer: Medicare Other

## 2015-12-17 ENCOUNTER — Telehealth: Payer: Self-pay | Admitting: Cardiology

## 2015-12-17 DIAGNOSIS — Z95810 Presence of automatic (implantable) cardiac defibrillator: Secondary | ICD-10-CM

## 2015-12-17 DIAGNOSIS — I5022 Chronic systolic (congestive) heart failure: Secondary | ICD-10-CM

## 2015-12-17 NOTE — Telephone Encounter (Signed)
Reviewed with Sharman Cheek - pt is holding fluid - she will follow with ICM and will call the pt today.

## 2015-12-17 NOTE — Telephone Encounter (Signed)
Spoke with pt and reminded pt of remote transmission that is due today. Pt verbalized understanding.   

## 2015-12-17 NOTE — Progress Notes (Signed)
EPIC Encounter for ICM Monitoring  Patient Name: [name] is a 70 y.o. female Date: 12/17/2015 Primary Care Physican: Shirline Frees, MD Primary Cardiologist: Marlou Porch Electrophysiologist: Caryl Comes Dry Weight: unknown Bi-V Pacing:  93%        Patient referred to [facility] clinic by Cherre Huger, RN/Dr [facility] due to patient currently have SOB but no edema.   Call to patient regarding symptoms and remote transmission.  Provided ICM introduction and she agreed to [facility] monthly follow up calls.  Provided ICM phone number.    Heart Failure questions reviewed, pt symptomatic with SOB  Thoracic impedance abnormal suggesting fluid accumulation 12/15/2015.  Recommendations:  Increase Furosemide 80 mg to bid x 3 days only and return to prescribed dosage of 80 mg daily after 3rd day.  Increase Potassium 20 mEq 1 tablet to bid x 3 days only and then return to prescribed dosage of 1 tablet daily.   Follow-up plan: ICM clinic phone appointment on 12/20/2015 to recheck fluid levels.  Copy of ICM check sent to primary cardiologist and device physician.   ICM trend: 12/17/2015     Direct Trend Viewer  [name], RN 12/17/2015 1:12 PM

## 2015-12-17 NOTE — Telephone Encounter (Signed)
Received call from operator with patient c/o increased SOB for the last several days.  She feels it all the time but it seems worse in the evenings after dinner.  She has been sitting outside "where it is cooler and that seems to help some".  She denies any other s/s - no edema in feet, legs or abdomin.  She reports taking medications as listed.  She has not checked her HR/BP and reports she has a machine but doesn't know how to use it.

## 2015-12-17 NOTE — Telephone Encounter (Signed)
Spoke with patient.

## 2015-12-18 NOTE — Progress Notes (Signed)
Jerline Pain, MD  [name], RN        Thank you. Excellent work.  Candee Furbish, MD

## 2015-12-20 ENCOUNTER — Ambulatory Visit (INDEPENDENT_AMBULATORY_CARE_PROVIDER_SITE_OTHER): Payer: Medicare Other

## 2015-12-20 ENCOUNTER — Telehealth: Payer: Self-pay | Admitting: Cardiology

## 2015-12-20 DIAGNOSIS — Z95810 Presence of automatic (implantable) cardiac defibrillator: Secondary | ICD-10-CM

## 2015-12-20 DIAGNOSIS — I5022 Chronic systolic (congestive) heart failure: Secondary | ICD-10-CM

## 2015-12-20 NOTE — Telephone Encounter (Signed)
Confirmed remote transmission w/ pt husband.   

## 2015-12-21 ENCOUNTER — Telehealth: Payer: Self-pay

## 2015-12-21 NOTE — Telephone Encounter (Signed)
Remote ICM transmission received.  Attempted patient call and and

## 2015-12-21 NOTE — Progress Notes (Signed)
EPIC Encounter for ICM Monitoring  Patient Name: [name] is a 70 y.o. female Date: 12/21/2015 Primary Care Physican: Shirline Frees, MD Primary Cardiologist: Marlou Porch Electrophysiologist: Caryl Comes Dry Weight:  unknown Bi-V Pacing:  93%              Spoke with husband (DPR) due to patient was running errands.  He reported he thinks she is feeling better.    Thoracic impedance returned to normal after Furosemide increase.  Recommendations: No changes.   Follow-up plan: ICM clinic phone appointment on 01/29/2016.  Copy of ICM check sent to primary cardiologist and device physician to show updated transmission and impedance shows above baseline which correlates with taking extra Furosemide x 3 days.   ICM trend: 12/20/2015       [name], RN 12/21/2015 10:32 AM

## 2015-12-24 ENCOUNTER — Encounter: Payer: Self-pay | Admitting: Podiatry

## 2015-12-24 ENCOUNTER — Ambulatory Visit (INDEPENDENT_AMBULATORY_CARE_PROVIDER_SITE_OTHER): Payer: Medicare Other | Admitting: Podiatry

## 2015-12-24 DIAGNOSIS — M216X9 Other acquired deformities of unspecified foot: Secondary | ICD-10-CM | POA: Diagnosis not present

## 2015-12-24 DIAGNOSIS — M779 Enthesopathy, unspecified: Secondary | ICD-10-CM

## 2015-12-24 DIAGNOSIS — M2042 Other hammer toe(s) (acquired), left foot: Secondary | ICD-10-CM | POA: Diagnosis not present

## 2015-12-24 NOTE — Patient Instructions (Signed)

## 2015-12-26 NOTE — Progress Notes (Signed)
Subjective:     Patient ID: [name], female   DOB: Sep 30, 1945, 70 y.o.   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 I've recommended digital fusion along with metatarsal osteotomy and I allowed patient to read consent form reviewing alternative treatments complications. Patient wants surgery understanding the difficulty of this and signs consent form understanding recovery will take approximate 6 months that she will have his fifth toe on the second and that there is no guarantee this was all the problem. She will need to wear her boot postoperatively will bring it with her at the time of surgery  X-ray report was negative for signs of change in toe position or fracture or arthritis

## 2015-12-27 ENCOUNTER — Telehealth: Payer: Self-pay | Admitting: *Deleted

## 2015-12-27 NOTE — Telephone Encounter (Signed)
I'm calling to advise you that the request from Dr. Paulla Dolly has been approved.  Your request has been approved for cpt codes 28308 and 28285, effective 12/26/2015 to 01/25/2016 outpatient.  The authorization number is [id number].  Please be advised that 20550 is not managed by [facility] but it does not require authorization from the plan.  Contact us if you have any additional questions.  I called to inform Caren Griffins at [facility].  She stated surgery was going to be canceled.  Decision was made that due to patient's heart condition surgery would not be able to be performed there.  I will let Dr. Paulla Dolly know.

## 2015-12-28 NOTE — Telephone Encounter (Signed)
Per Caren Griffins at [facility] patient's surgery cannot be performed there due to Cardiac condition.  I called and informed patient that were were going to have to reschedule her surgery.  It will have to be done at [place].  Dr. Paulla Dolly does not do surgeries there.  He is referring you to Dr. Amalia Hailey.  You will need to schedule a surgery consultation with Dr. Amalia Hailey.  When you come in to see him we can schedule you for a surgery date.  You will have to have a history and physical completed by your primary care physician.  You may want to call your Cardiologist and see if it is okay for you to have the procedure.  He informed the anesthesiologist at the surgery center that you were at high risk. "Okay I will give therm a call.  I'll go ahead an schedule an appointment with Dr. Amalia Hailey."  I transferred her to a scheduler.

## 2015-12-28 NOTE — Telephone Encounter (Signed)
"  I want to make sure I am clear on what needs to be done."  You are to ask your Cardiologist to send a letter of medical clearance to Dr. Amalia Hailey stating you are medically cleared to have surgery.  He can fax it to [phone].  "Okay, thank you."

## 2015-12-31 ENCOUNTER — Telehealth: Payer: Self-pay | Admitting: Cardiology

## 2015-12-31 ENCOUNTER — Ambulatory Visit (INDEPENDENT_AMBULATORY_CARE_PROVIDER_SITE_OTHER): Payer: Medicare Other | Admitting: Endocrinology

## 2015-12-31 VITALS — BP 98/63 | HR 79 | Ht 63.0 in | Wt 163.0 lb

## 2015-12-31 DIAGNOSIS — Z23 Encounter for immunization: Secondary | ICD-10-CM

## 2015-12-31 DIAGNOSIS — E890 Postprocedural hypothyroidism: Secondary | ICD-10-CM

## 2015-12-31 NOTE — Progress Notes (Signed)
Patient ID: [name], female   DOB: 04-22-1945, 70 y.o.   MRN: [id number]    Reason for Appointment:  Post ablative hypothyroidism, followup  History of Present Illness:    She became hypothyroid in 03/2012 following radioactive iodine treatment for hyperthyroidism in 11/13  She was initially started with 75 mcg of levothyroxine but with her TSH being nearly 50 in 3/14 the dose was increased to 137 mcg Subsequently has required relatively lower doses   Usually she tends to have chronic fatigue regardless of her thyroid level   She had been losing weight progressively in 2016 but now has continued to gain more  She has mild chronic cold intolerance and normally keeps her house warm because of her trigeminal neuralgia   Again today she does not complain of any change in her energy level.  She is quite compliant with taking her medication daily, usually takes it after breakfast for better tolerability She takes her iron in the evening  Although her TSH was quite normal in March it is now relatively low    Wt Readings from Last 3 Encounters:  12/31/15 163 lb (73.9 kg)  11/21/15 153 lb (69.4 kg)  10/29/15 151 lb (68.5 kg)   Labs:  Lab Results  Component Value Date   TSH 0.30 (L) 12/06/2015   TSH 2.01 06/15/2015   TSH 5.66 (H) 03/05/2015   FREET4 0.75 12/06/2015   FREET4 1.02 06/15/2015   FREET4 0.77 03/05/2015         Medication List       Accurate as of 12/31/15  4:37 PM. Always use your most recent med list.          aspirin EC 81 MG tablet Take 1 tablet (81 mg total) by mouth daily.   carbamazepine 100 MG chewable tablet Commonly known as:  TEGRETOL Chew 100 mg by mouth 2 (two) times daily.   carvedilol 3.125 MG tablet Commonly known as:  COREG Take 1 tablet (3.125 mg total) by mouth 2 (two) times daily with a meal.   clorazepate 7.5 MG tablet Commonly known as:  TRANXENE Take 7.5 mg by mouth at bedtime.   cyclobenzaprine 5 MG  tablet Commonly known as:  FLEXERIL TAKE 1 TABLET BY MOUTH 3 TIMES A DAY AS NEEDED FOR MUSCLE SPASMS   diclofenac sodium 1 % Gel Commonly known as:  VOLTAREN Apply 1 application topically 3 (three) times daily. Apply to knees and hands   furosemide 80 MG tablet Commonly known as:  LASIX Take 1 tablet (80 mg total) by mouth daily.   gabapentin 800 MG tablet Commonly known as:  NEURONTIN Take 800 mg by mouth 4 (four) times daily as needed. For pain   iron polysaccharides 150 MG capsule Commonly known as:  NIFEREX Take 150 mg by mouth 2 (two) times daily.   levothyroxine 112 MCG tablet Commonly known as:  SYNTHROID, LEVOTHROID Take 1 tablet (112 mcg total) by mouth daily.   mirtazapine 30 MG tablet Commonly known as:  REMERON Take 30 mg by mouth at bedtime.   nitroGLYCERIN 0.4 MG SL tablet Commonly known as:  NITROSTAT TAKE 1 TABLET AS DIRECTED SUBLINGUALLY AS DIRECTED FOR CHEST PAIN   Oxycodone HCl 20 MG Tabs Take 1 tablet (20 mg total) by mouth every 6 (six) hours as needed (pain).   potassium chloride SA 20 MEQ tablet Commonly known as:  KLOR-CON M20 Take 1 tablet (20 mEq total) by mouth daily.   promethazine 25 MG  tablet Commonly known as:  PHENERGAN Take 1 tablet (25 mg total) by mouth every 6 (six) hours as needed. for nasuea   ramipril 2.5 MG capsule Commonly known as:  ALTACE TAKE 1 CAPSULE (2.5 MG TOTAL) BY MOUTH DAILY.   REFRESH TEARS 0.5 % Soln Generic drug:  carboxymethylcellulose Place 1 drop into both eyes daily.   simvastatin 10 MG tablet Commonly known as:  ZOCOR TAKE ONE TABLET BY MOUTH EVERY DAY   spironolactone 25 MG tablet Commonly known as:  ALDACTONE Take 0.5 tablets (12.5 mg total) by mouth daily.   valACYclovir 500 MG tablet Commonly known as:  VALTREX Take 1 tablet by mouth as directed.   venlafaxine XR 150 MG 24 hr capsule Commonly known as:  EFFEXOR-XR Take 150 mg by mouth daily with breakfast.   zolpidem 10 MG  tablet Commonly known as:  AMBIEN Take 10 mg by mouth at bedtime. For sleep           Past Medical History:  Diagnosis Date  . Anemia   . CAD (coronary artery disease)    Prior LAD stenting with stent thrombosis occurring in a withdrawal phase of her Plavix undertaken because of trigeminal neuralgia and release surgery. It was treated medically.  . Cancer ([place])    small bladder cancer removed no  treatment  . Cataract   . CHF (congestive heart failure) ([place])   . Complication of anesthesia    massive heart attack  . GERD (gastroesophageal reflux disease)   . Hyperlipidemia   . Ischemic cardiomyopathy   . Osteoarthritis   . SOB (shortness of breath)     Past Surgical History:  Procedure Laterality Date  . APPENDECTOMY    . BACK SURGERY     x2 back surgeries  . BI-VENTRICULAR IMPLANTABLE CARDIOVERTER DEFIBRILLATOR  (CRT-D)  2009; 05/2013   STJ CRTD generator change by Dr Caryl Comes 05/2013  . BIV PACEMAKER GENERATOR CHANGE OUT Bilateral 05/11/2013   Procedure: BIV PACEMAKER GENERATOR CHANGE OUT;  Surgeon: Deboraha Sprang, MD;  Location[facility] CATH LAB;  Service: Cardiovascular;  Laterality: Bilateral;  . BRAIN SURGERY     x3  . EYE SURGERY Right 05/24/12  . JOINT REPLACEMENT     thumbs and knees replacement  . TUBAL LIGATION      Family History  Problem Relation Age of Onset  . Heart disease Father     Social History:  reports that she has been smoking Cigarettes.  She has a 15.00 pack-year smoking history. She has never used smokeless tobacco. She reports that she does not drink alcohol or use drugs.  Allergies: No Known Allergies  Review of Systems:  CARDIOLOGY: she has history of coronary artery disease with multiple events and interventions  History of trigeminal neuralgia   On oxycodone for joint pains .  She tends to have low normal blood pressure readings at times     BP Readings from Last 3 Encounters:  12/31/15 98/63  11/21/15 94/60  10/29/15 130/72            Examination:   BP 98/63   Pulse 79   Ht 5\' 3"  (1.6 m)   Wt 163 lb (73.9 kg)   BMI 28.87 kg/m   She looks well No peripheral edema Biceps reflexes appear normal    Assessment   Post ablative hypothyroidism:   She is currently on a regimen of 112 g which previously had been a stable dose Even though her weight has gone up she  has a relatively low TSH now She has not changed the way she takes her medication  For now will continue on 112 g but take 6-1/2 tablets a week.  Influenza vaccine given  Follow-up in 4 months again    [facility] 12/31/2015, 4:37 PM

## 2015-12-31 NOTE — Patient Instructions (Signed)
ON SundayS only take 1/2 Levothyroxine and 1 on other days

## 2015-12-31 NOTE — Telephone Encounter (Signed)
"  I have an appointment with Dr. Amalia Hailey on the 5th of October.  Somebody called me the other day and told me I needed to get a letter of medical clearnce.  My Cardiologist said that Dr. Amalia Hailey needed to send a form asking for this information.  I Give me a call.  Call me in the morning because I'm getting ready to head out.

## 2015-12-31 NOTE — Telephone Encounter (Signed)
New message    Patient is aware to have Surgeon office to fax form over for upcoming surgery.     Patient wants to personally speak with [name] if she should have surgery or not.

## 2016-01-01 NOTE — Telephone Encounter (Signed)
Will forward to Dr Marlou Porch to review.  Last office visit note states high risk and he doesn't recommend elective surgery.

## 2016-01-02 DIAGNOSIS — G500 Trigeminal neuralgia: Secondary | ICD-10-CM | POA: Diagnosis not present

## 2016-01-02 DIAGNOSIS — Z79899 Other long term (current) drug therapy: Secondary | ICD-10-CM | POA: Diagnosis not present

## 2016-01-02 DIAGNOSIS — Z79891 Long term (current) use of opiate analgesic: Secondary | ICD-10-CM | POA: Diagnosis not present

## 2016-01-02 DIAGNOSIS — G894 Chronic pain syndrome: Secondary | ICD-10-CM | POA: Diagnosis not present

## 2016-01-02 DIAGNOSIS — R51 Headache: Secondary | ICD-10-CM | POA: Diagnosis not present

## 2016-01-02 NOTE — Telephone Encounter (Signed)
Agree, I would not recommend surgery given her high risk from a cardiovascular perspective unless surgery is absolutely necessary. Candee Furbish, MD

## 2016-01-03 ENCOUNTER — Ambulatory Visit (INDEPENDENT_AMBULATORY_CARE_PROVIDER_SITE_OTHER): Payer: Medicare Other | Admitting: Podiatry

## 2016-01-03 DIAGNOSIS — M779 Enthesopathy, unspecified: Secondary | ICD-10-CM | POA: Diagnosis not present

## 2016-01-03 DIAGNOSIS — M79672 Pain in left foot: Secondary | ICD-10-CM | POA: Diagnosis not present

## 2016-01-03 DIAGNOSIS — M216X9 Other acquired deformities of unspecified foot: Secondary | ICD-10-CM

## 2016-01-03 MED ORDER — NONFORMULARY OR COMPOUNDED ITEM: 1.0000 g | Freq: Four times a day (QID) | 2 refills | Status: DC

## 2016-01-03 NOTE — Telephone Encounter (Signed)
We received a message from patient's Cardiologist, [name].  He does not recommend surgery unless it is absolutely necessary due to patient's Cardiac condition.

## 2016-01-03 NOTE — Addendum Note (Signed)
Addended by: Johnnye Lana A on: 01/03/2016 04:52 PM   Modules accepted: Orders

## 2016-01-03 NOTE — Progress Notes (Signed)
Subjective:  Patient presents today who is a referral from Dr. Paulla Dolly for surgical consult evaluation. Patient states that she's had ongoing pain to the left foot for several months now. The patient's cardiologist, [name], does not recommend elective surgery at this moment due to her heart conditions. Patient presents today for further treatment and evaluation.    Objective/Physical Exam General: The patient is alert and oriented x3 in no acute distress.  Dermatology: Skin is warm, dry and supple bilateral lower extremities. Negative for open lesions or macerations.  Vascular: Palpable pedal pulses bilaterally. No edema or erythema noted. Capillary refill within normal limits.  Neurological: Epicritic and protective threshold grossly intact bilaterally.   Musculoskeletal Exam: Pain on palpation to the dorsal aspect of the patient's left midfoot. Also pain on palpation to the second third MPJs left foot.  Range of motion within normal limits to all pedal and ankle joints bilateral. Muscle strength 5/5 in all groups bilateral.   Assessment: #1 capsulitis second and third MPJs left foot. #2 arthritis midfoot left #3 pain in left foot   Plan of Care:  #1 Patient was evaluated. #2 today we discuss the surgical versus conservative management of her foot conditions. The decision was made not to proceed with surgical intervention at this time. #3 prescription for anti-inflammatory pain cream was dispensed through [facility] #4 patient is to return to clinic in 2 months   Dr. Edrick Kins, [place]

## 2016-01-07 ENCOUNTER — Other Ambulatory Visit: Payer: Self-pay | Admitting: Cardiology

## 2016-01-10 ENCOUNTER — Other Ambulatory Visit: Payer: Self-pay

## 2016-01-16 ENCOUNTER — Other Ambulatory Visit: Payer: Self-pay | Admitting: Cardiology

## 2016-01-25 ENCOUNTER — Ambulatory Visit (INDEPENDENT_AMBULATORY_CARE_PROVIDER_SITE_OTHER): Payer: Medicare Other

## 2016-01-25 ENCOUNTER — Telehealth: Payer: Self-pay | Admitting: Cardiology

## 2016-01-25 DIAGNOSIS — Z95810 Presence of automatic (implantable) cardiac defibrillator: Secondary | ICD-10-CM

## 2016-01-25 DIAGNOSIS — I5022 Chronic systolic (congestive) heart failure: Secondary | ICD-10-CM

## 2016-01-25 NOTE — Telephone Encounter (Signed)
Called patient about her symptoms. Patient stated she has SOB, but it's been worse the last couple of weeks. Patient stated she gets SOB with activity and talking, and it comes and goes. Talked to [facility] nurse, she advised to have patient send transmission. Patient stated she would do that right now. Will forward to [facility] nurse.

## 2016-01-25 NOTE — Telephone Encounter (Signed)
New message   Pt c/o Shortness Of Breath: STAT if SOB developed within the last 24 hours or pt is noticeably SOB on the phone  1. Are you currently SOB (can you hear that pt is SOB on the phone)? no  2. How long have you been experiencing SOB? 2 weeks 3. Are you SOB when sitting or when up moving around? Coming and going 4. Are you currently experiencing any other symptoms? no

## 2016-01-25 NOTE — Progress Notes (Signed)
EPIC Encounter for ICM Monitoring  Patient Name: [name] is a 70 y.o. female Date: 01/25/2016 Primary Care Physican: Shirline Frees, MD Primary Cardiologist: Marlou Porch Electrophysiologist: Caryl Comes Dry Weight: 165 lb  Bi-V Pacing:  93%       Patient called reporting increased SOB and weight gain of 5 pounds.    Thoracic impedance abnormal suggesting fluid accumulation since 01/17/2016.  Patient has not been taking meds as prescribed.  She stated some days she does not take any Lasix 80 mg and other days she may take 1 or 2 depending on how she feels.  She said she has been taking 1-2 tablets of Potassium.  Recommendations:   Advised she needs to start taking medications as they are prescribed and the risk is higher for hospitalization when not taking meds correctly.     Advised to increase Furosemide 80 mg to bid x 3 days only and return to prescribed dosage of 80 mg daily after 3rd day.  Increase Potassium 20 mEq 1 tablet to bid x 3 days only and then return to prescribed dosage of 1 tablet daily.   Repeated directions several times.  BMET and BNP ordered for 01/29/2016.   Follow-up plan: ICM clinic phone appointment on 01/29/2016 to recheck fluid levels.  Copy of ICM check sent to primary cardiologist and device physician for review and any further recommendations.   ICM trend: 01/25/2016       [name], RN 01/25/2016 12:49 PM

## 2016-01-28 DIAGNOSIS — J438 Other emphysema: Secondary | ICD-10-CM | POA: Diagnosis not present

## 2016-01-28 NOTE — Telephone Encounter (Signed)
Spoke with patient.. See ICM note 

## 2016-01-29 ENCOUNTER — Other Ambulatory Visit: Payer: Self-pay | Admitting: Family Medicine

## 2016-01-29 ENCOUNTER — Ambulatory Visit
Admission: RE | Admit: 2016-01-29 | Discharge: 2016-01-29 | Disposition: A | Discharge status: Home / Self Care | Payer: Medicare Other | Source: Ambulatory Visit | Attending: Family Medicine | Admitting: Family Medicine

## 2016-01-29 ENCOUNTER — Telehealth: Payer: Self-pay | Admitting: Cardiology

## 2016-01-29 ENCOUNTER — Ambulatory Visit (INDEPENDENT_AMBULATORY_CARE_PROVIDER_SITE_OTHER): Payer: Medicare Other | Admitting: *Deleted

## 2016-01-29 ENCOUNTER — Other Ambulatory Visit: Payer: Medicare Other | Admitting: *Deleted

## 2016-01-29 DIAGNOSIS — J438 Other emphysema: Secondary | ICD-10-CM

## 2016-01-29 DIAGNOSIS — I255 Ischemic cardiomyopathy: Secondary | ICD-10-CM

## 2016-01-29 DIAGNOSIS — R0602 Shortness of breath: Secondary | ICD-10-CM | POA: Diagnosis not present

## 2016-01-29 DIAGNOSIS — Z95810 Presence of automatic (implantable) cardiac defibrillator: Secondary | ICD-10-CM

## 2016-01-29 DIAGNOSIS — H43393 Other vitreous opacities, bilateral: Secondary | ICD-10-CM | POA: Diagnosis not present

## 2016-01-29 DIAGNOSIS — H40033 Anatomical narrow angle, bilateral: Secondary | ICD-10-CM | POA: Diagnosis not present

## 2016-01-29 DIAGNOSIS — I5022 Chronic systolic (congestive) heart failure: Secondary | ICD-10-CM | POA: Diagnosis not present

## 2016-01-29 LAB — BASIC METABOLIC PANEL
BUN: 19 mg/dL (ref 7–25)
CHLORIDE: 101 mmol/L (ref 98–110)
CO2: 28 mmol/L (ref 20–31)
CREATININE: 1.19 mg/dL — AB (ref 0.60–0.93)
Calcium: 9.6 mg/dL (ref 8.6–10.4)
Glucose, Bld: 107 mg/dL — ABNORMAL HIGH (ref 65–99)
Potassium: 4.5 mmol/L (ref 3.5–5.3)
Sodium: 140 mmol/L (ref 135–146)

## 2016-01-29 NOTE — Telephone Encounter (Signed)
Follow Up:    Returning a call.

## 2016-01-29 NOTE — Progress Notes (Signed)
EPIC Encounter for ICM Monitoring  Patient Name: [name] is a 70 y.o. female Date: 01/29/2016 Primary Care Physican: Shirline Frees, MD Primary Cardiologist:[place] Electrophysiologist: Caryl Comes Dry Weight:    unknown  Bi-V Pacing:  93%          Attempted ICM call and unable to reach.   Transmission reviewed.   Thoracic impedance returned to normal after taking increase in Furosemide and Potassium as directed.  Follow-up plan: ICM clinic phone appointment on 03/04/2016.  Copy of ICM check sent to primary cardiologist and device physician for review of updated transmission and impedance has returned to normal.   ICM trend: 01/29/2016      [name], RN 01/29/2016 1:13 PM

## 2016-01-29 NOTE — Progress Notes (Signed)
This encounter was created in error - please disregard.

## 2016-01-30 DIAGNOSIS — G500 Trigeminal neuralgia: Secondary | ICD-10-CM | POA: Diagnosis not present

## 2016-01-30 DIAGNOSIS — G894 Chronic pain syndrome: Secondary | ICD-10-CM | POA: Diagnosis not present

## 2016-01-30 DIAGNOSIS — Z79891 Long term (current) use of opiate analgesic: Secondary | ICD-10-CM | POA: Diagnosis not present

## 2016-01-30 DIAGNOSIS — M79606 Pain in leg, unspecified: Secondary | ICD-10-CM | POA: Diagnosis not present

## 2016-01-30 DIAGNOSIS — Z79899 Other long term (current) drug therapy: Secondary | ICD-10-CM | POA: Diagnosis not present

## 2016-01-30 DIAGNOSIS — R51 Headache: Secondary | ICD-10-CM | POA: Diagnosis not present

## 2016-01-30 LAB — BRAIN NATRIURETIC PEPTIDE: BRAIN NATRIURETIC PEPTIDE: 460.2 pg/mL — AB (ref <100)

## 2016-01-30 NOTE — Progress Notes (Signed)
Remote ICD transmission.   

## 2016-01-30 NOTE — Telephone Encounter (Signed)
Attempted call and left message

## 2016-02-06 ENCOUNTER — Encounter: Payer: Self-pay | Admitting: Cardiology

## 2016-02-07 ENCOUNTER — Other Ambulatory Visit: Payer: Self-pay | Admitting: Cardiology

## 2016-02-07 ENCOUNTER — Ambulatory Visit (INDEPENDENT_AMBULATORY_CARE_PROVIDER_SITE_OTHER): Payer: Medicare Other | Admitting: Podiatry

## 2016-02-07 ENCOUNTER — Encounter: Payer: Self-pay | Admitting: Podiatry

## 2016-02-07 DIAGNOSIS — M79671 Pain in right foot: Secondary | ICD-10-CM | POA: Diagnosis not present

## 2016-02-07 DIAGNOSIS — M779 Enthesopathy, unspecified: Principal | ICD-10-CM

## 2016-02-07 DIAGNOSIS — M7751 Other enthesopathy of right foot: Secondary | ICD-10-CM

## 2016-02-07 DIAGNOSIS — M778 Other enthesopathies, not elsewhere classified: Secondary | ICD-10-CM

## 2016-02-07 DIAGNOSIS — G8929 Other chronic pain: Secondary | ICD-10-CM

## 2016-02-10 MED ORDER — BETAMETHASONE SOD PHOS & ACET 6 (3-3) MG/ML IJ SUSP: 3.0000 mg | Freq: Once | INTRAMUSCULAR | Status: AC

## 2016-02-10 NOTE — Progress Notes (Signed)
Subjective:  Patient presents today who is a referral from Dr. Paulla Dolly for surgical consult evaluation. Patient states that she's had ongoing pain to the left foot for several months now. The patient's cardiologist, [name], does not recommend elective surgery at this moment due to her heart conditions. Patient presents today for further treatment and evaluation.    Objective/Physical Exam General: The patient is alert and oriented x3 in no acute distress.  Dermatology: Skin is warm, dry and supple bilateral lower extremities. Negative for open lesions or macerations.  Vascular: Palpable pedal pulses bilaterally. No edema or erythema noted. Capillary refill within normal limits.  Neurological: Epicritic and protective threshold grossly intact bilaterally.   Musculoskeletal Exam: Pain on palpation to the dorsal aspect of the patient's left midfoot. Also pain on palpation to the second third MPJs left foot.  Range of motion within normal limits to all pedal and ankle joints bilateral. Muscle strength 5/5 in all groups bilateral.   Assessment: #1 capsulitis second and third MPJs left foot. #2 arthritis midfoot left #3 pain in left foot   Plan of Care:  #1 Patient was evaluated. #2 injection of 0.5 mL Celestone Soluspan injected into the midfoot lash Lisfranc joint right foot #3 injection 0.5 mL Celestone Soluspan injected in the second MPJ right foot #4 continue anti-inflammatory pain cream dispensed through [place] #5 continue conservative treatment #6 return to clinic when necessary  Dr. Edrick Kins, [place]

## 2016-02-26 DIAGNOSIS — G44091 Other trigeminal autonomic cephalgias (TAC), intractable: Secondary | ICD-10-CM | POA: Diagnosis not present

## 2016-02-27 ENCOUNTER — Other Ambulatory Visit: Payer: Self-pay | Admitting: Cardiology

## 2016-02-28 DIAGNOSIS — J438 Other emphysema: Secondary | ICD-10-CM | POA: Diagnosis not present

## 2016-02-28 LAB — CUP PACEART REMOTE DEVICE CHECK
Battery Remaining Longevity: 42 mo
Battery Remaining Percentage: 58 %
Battery Voltage: 2.93 V
Date Time Interrogation Session: 20171031080019
HIGH POWER IMPEDANCE MEASURED VALUE: 41 Ohm
HighPow Impedance: 41 Ohm
Implantable Lead Implant Date: 20090429
Implantable Lead Location: 753858
Implantable Lead Location: 753860
Implantable Pulse Generator Implant Date: 20150211
Lead Channel Impedance Value: 360 Ohm
Lead Channel Impedance Value: 490 Ohm
Lead Channel Pacing Threshold Pulse Width: 0.5 ms
Lead Channel Sensing Intrinsic Amplitude: 12 mV
Lead Channel Setting Pacing Amplitude: 2 V
MDC IDC LEAD IMPLANT DT: 20090429
MDC IDC LEAD IMPLANT DT: 20090429
MDC IDC LEAD LOCATION: 753859
MDC IDC MSMT LEADCHNL LV PACING THRESHOLD AMPLITUDE: 0.75 V
MDC IDC MSMT LEADCHNL LV PACING THRESHOLD PULSEWIDTH: 0.5 ms
MDC IDC MSMT LEADCHNL RA IMPEDANCE VALUE: 300 Ohm
MDC IDC MSMT LEADCHNL RA PACING THRESHOLD AMPLITUDE: 1 V
MDC IDC MSMT LEADCHNL RA SENSING INTR AMPL: 1.8 mV
MDC IDC MSMT LEADCHNL RV PACING THRESHOLD AMPLITUDE: 1.5 V
MDC IDC MSMT LEADCHNL RV PACING THRESHOLD PULSEWIDTH: 1 ms
MDC IDC SET LEADCHNL LV PACING AMPLITUDE: 2 V
MDC IDC SET LEADCHNL LV PACING PULSEWIDTH: 0.5 ms
MDC IDC SET LEADCHNL RV PACING AMPLITUDE: 2.5 V
MDC IDC SET LEADCHNL RV PACING PULSEWIDTH: 1 ms
MDC IDC SET LEADCHNL RV SENSING SENSITIVITY: 0.5 mV
MDC IDC STAT BRADY AP VP PERCENT: 13 %
MDC IDC STAT BRADY AP VS PERCENT: 1 %
MDC IDC STAT BRADY AS VP PERCENT: 80 %
MDC IDC STAT BRADY AS VS PERCENT: 3 %
MDC IDC STAT BRADY RA PERCENT PACED: 11 %
Pulse Gen Serial Number: 7170199

## 2016-03-03 ENCOUNTER — Ambulatory Visit: Payer: Medicare Other | Admitting: Podiatry

## 2016-03-04 ENCOUNTER — Other Ambulatory Visit: Payer: Self-pay | Admitting: Cardiology

## 2016-03-04 ENCOUNTER — Telehealth: Payer: Self-pay

## 2016-03-04 ENCOUNTER — Ambulatory Visit (INDEPENDENT_AMBULATORY_CARE_PROVIDER_SITE_OTHER): Payer: Medicare Other

## 2016-03-04 DIAGNOSIS — I5022 Chronic systolic (congestive) heart failure: Secondary | ICD-10-CM

## 2016-03-04 DIAGNOSIS — Z95810 Presence of automatic (implantable) cardiac defibrillator: Secondary | ICD-10-CM | POA: Diagnosis not present

## 2016-03-04 NOTE — Progress Notes (Signed)
EPIC Encounter for ICM Monitoring  Patient Name: [name] is a 70 y.o. female Date: 03/04/2016 Primary Care Physican: Shirline Frees, MD Primary Cardiologist:Skains Electrophysiologist: Caryl Comes Dry Weight:    unknown  Bi-V Pacing:  93%            Attempted ICM call and unable to reach. Left detailed message regarding transmission.  Transmission reviewed.   Thoracic impedance normal   Recommendations: NONE - unable to reach  Follow-up plan: ICM clinic phone appointment on 04/04/2016.  Copy of ICM check sent to device physician.   ICM trend: 03/04/2016       [name], RN 03/04/2016 3:24 PM

## 2016-03-04 NOTE — Telephone Encounter (Signed)
Remote ICM transmission received.  Attempted patient call and left detailed message regarding transmission and next ICM scheduled for 04/04/2016.  Advised to return call for any fluid symptoms or questions.

## 2016-03-05 DIAGNOSIS — G500 Trigeminal neuralgia: Secondary | ICD-10-CM | POA: Diagnosis not present

## 2016-03-05 DIAGNOSIS — G894 Chronic pain syndrome: Secondary | ICD-10-CM | POA: Diagnosis not present

## 2016-03-05 DIAGNOSIS — Z79891 Long term (current) use of opiate analgesic: Secondary | ICD-10-CM | POA: Diagnosis not present

## 2016-03-05 DIAGNOSIS — M25569 Pain in unspecified knee: Secondary | ICD-10-CM | POA: Diagnosis not present

## 2016-03-05 DIAGNOSIS — R51 Headache: Secondary | ICD-10-CM | POA: Diagnosis not present

## 2016-03-05 DIAGNOSIS — Z79899 Other long term (current) drug therapy: Secondary | ICD-10-CM | POA: Diagnosis not present

## 2016-03-06 ENCOUNTER — Ambulatory Visit: Payer: Medicare Other | Admitting: Podiatry

## 2016-03-28 ENCOUNTER — Telehealth: Payer: Self-pay | Admitting: Endocrinology

## 2016-03-28 ENCOUNTER — Other Ambulatory Visit (INDEPENDENT_AMBULATORY_CARE_PROVIDER_SITE_OTHER): Payer: Medicare Other

## 2016-03-28 DIAGNOSIS — E890 Postprocedural hypothyroidism: Secondary | ICD-10-CM | POA: Diagnosis not present

## 2016-03-28 LAB — T4, FREE: Free T4: 0.76 ng/dL (ref 0.60–1.60)

## 2016-03-28 LAB — TSH: TSH: 2.85 u[IU]/mL (ref 0.35–4.50)

## 2016-04-01 ENCOUNTER — Ambulatory Visit (INDEPENDENT_AMBULATORY_CARE_PROVIDER_SITE_OTHER): Payer: Medicare Other | Admitting: Endocrinology

## 2016-04-01 ENCOUNTER — Encounter: Payer: Self-pay | Admitting: Endocrinology

## 2016-04-01 VITALS — BP 98/63 | HR 83 | Ht 63.58 in | Wt 167.0 lb

## 2016-04-01 DIAGNOSIS — R0602 Shortness of breath: Secondary | ICD-10-CM | POA: Diagnosis not present

## 2016-04-01 DIAGNOSIS — E890 Postprocedural hypothyroidism: Secondary | ICD-10-CM

## 2016-04-01 NOTE — Progress Notes (Signed)
Patient ID: [name], female   DOB: 1945/05/11, 71 y.o.   MRN: [id number]    Reason for Appointment:  Post ablative hypothyroidism, followup  History of Present Illness:    She became hypothyroid in 03/2012 following radioactive iodine treatment for hyperthyroidism in 11/13  She was initially started with 75 mcg of levothyroxine but with her TSH being nearly 50 in 3/14 the dose was increased to 137 mcg Subsequently has required relatively lower doses   Usually she tends to have chronic fatigue regardless of her thyroid level   She had been losing weight in 2016 but now has continued to gain subsequently  She has mild chronic cold intolerance and normally keeps her house warm because of her trigeminal neuralgia   Since her TSH was low normal in 9/17 her dose was reduced to 6-1/2 tablets per week of the 112 g levothyroxine  Again she does not complain of any change in her energy level, overall feels fairly good but has had other medical problems.  She is quite compliant with taking her medication daily, usually takes it after breakfast for better tolerability  TSH is back to normal    Wt Readings from Last 3 Encounters:  04/01/16 167 lb (75.8 kg)  12/31/15 163 lb (73.9 kg)  11/21/15 153 lb (69.4 kg)   Labs:  Lab Results  Component Value Date   TSH 2.85 03/28/2016   TSH 0.30 (L) 12/06/2015   TSH 2.01 06/15/2015   FREET4 0.76 03/28/2016   FREET4 0.75 12/06/2015   FREET4 1.02 06/15/2015       Allergies as of 04/01/2016   No Known Allergies     Medication List       Accurate as of 04/01/16  3:42 PM. Always use your most recent med list.          aspirin EC 81 MG tablet Take 1 tablet (81 mg total) by mouth daily.   carbamazepine 100 MG chewable tablet Commonly known as:  TEGRETOL Chew 100 mg by mouth 2 (two) times daily.   carvedilol 3.125 MG tablet Commonly known as:  COREG Take 1 tablet (3.125 mg total) by mouth 2 (two) times daily with a  meal.   clorazepate 7.5 MG tablet Commonly known as:  TRANXENE Take 7.5 mg by mouth at bedtime.   cyclobenzaprine 5 MG tablet Commonly known as:  FLEXERIL TAKE 1 TABLET BY MOUTH 3 TIMES A DAY AS NEEDED FOR MUSCLE SPASMS   diclofenac sodium 1 % Gel Commonly known as:  VOLTAREN Apply 1 application topically 3 (three) times daily. Apply to knees and hands   furosemide 80 MG tablet Commonly known as:  LASIX Take 1 tablet (80 mg total) by mouth daily.   gabapentin 800 MG tablet Commonly known as:  NEURONTIN Take 800 mg by mouth 4 (four) times daily as needed. For pain   iron polysaccharides 150 MG capsule Commonly known as:  NIFEREX Take 150 mg by mouth 2 (two) times daily.   levothyroxine 112 MCG tablet Commonly known as:  SYNTHROID, LEVOTHROID Take 1 tablet (112 mcg total) by mouth daily.   mirtazapine 30 MG tablet Commonly known as:  REMERON Take 30 mg by mouth at bedtime.   nitroGLYCERIN 0.4 MG SL tablet Commonly known as:  NITROSTAT TAKE 1 TABLET AS DIRECTED SUBLINGUALLY AS DIRECTED FOR CHEST PAIN   NONFORMULARY OR COMPOUNDED ITEM Apply 1-2 g topically 4 (four) times daily.   Oxycodone HCl 20 MG Tabs Take 1 tablet (  20 mg total) by mouth every 6 (six) hours as needed (pain).   potassium chloride SA 20 MEQ tablet Commonly known as:  K-DUR,KLOR-CON TAKE ONE TABLET BY MOUTH DAILY   promethazine 25 MG tablet Commonly known as:  PHENERGAN Take 1 tablet (25 mg total) by mouth every 6 (six) hours as needed. for nasuea   ramipril 2.5 MG capsule Commonly known as:  ALTACE TAKE 1 CAPSULE (2.5 MG TOTAL) BY MOUTH DAILY.   REFRESH TEARS 0.5 % Soln Generic drug:  carboxymethylcellulose Place 1 drop into both eyes daily.   simvastatin 10 MG tablet Commonly known as:  ZOCOR TAKE ONE TABLET BY MOUTH EVERY DAY   spironolactone 25 MG tablet Commonly known as:  ALDACTONE TAKE 1/2 TABLET BY MOUTH EVERY DAY   valACYclovir 500 MG tablet Commonly known as:  VALTREX Take 1  tablet by mouth as directed.   venlafaxine XR 150 MG 24 hr capsule Commonly known as:  EFFEXOR-XR Take 150 mg by mouth daily with breakfast.   zolpidem 10 MG tablet Commonly known as:  AMBIEN Take 10 mg by mouth at bedtime. For sleep           Past Medical History:  Diagnosis Date  . Anemia   . CAD (coronary artery disease)    Prior LAD stenting with stent thrombosis occurring in a withdrawal phase of her Plavix undertaken because of trigeminal neuralgia and release surgery. It was treated medically.  . Cancer ([place])    small bladder cancer removed no  treatment  . Cataract   . CHF (congestive heart failure) ([place])   . Complication of anesthesia    massive heart attack  . GERD (gastroesophageal reflux disease)   . Hyperlipidemia   . Ischemic cardiomyopathy   . Osteoarthritis   . SOB (shortness of breath)     Past Surgical History:  Procedure Laterality Date  . APPENDECTOMY    . BACK SURGERY     x2 back surgeries  . BI-VENTRICULAR IMPLANTABLE CARDIOVERTER DEFIBRILLATOR  (CRT-D)  2009; 05/2013   STJ CRTD generator change by Dr Caryl Comes 05/2013  . BIV PACEMAKER GENERATOR CHANGE OUT Bilateral 05/11/2013   Procedure: BIV PACEMAKER GENERATOR CHANGE OUT;  Surgeon: Deboraha Sprang, MD;  Location: [facility] CATH LAB;  Service: Cardiovascular;  Laterality: Bilateral;  . BRAIN SURGERY     x3  . EYE SURGERY Right 05/24/12  . JOINT REPLACEMENT     thumbs and knees replacement  . TUBAL LIGATION      Family History  Problem Relation Age of Onset  . Heart disease Father     Social History:  reports that she has been smoking Cigarettes.  She has a 15.00 pack-year smoking history. She has never used smokeless tobacco. She reports that she does not drink alcohol or use drugs.  Allergies: No Known Allergies  Review of Systems:  CARDIOLOGY: she has history of coronary artery disease with multiple events and interventions  History of trigeminal neuralgia   She tends to have low normal blood  pressure readings   BP Readings from Last 3 Encounters:  04/01/16 98/63  12/31/15 98/63  11/21/15 94/60   She has not made any efforts to quit smoking recently        Examination:   BP 98/63   Pulse 83   Ht 5' 3.58" (1.615 m)   Wt 167 lb (75.8 kg)   SpO2 94%   BMI 29.04 kg/m   She looks well Heart rate regular, 80 Biceps reflexes  appear normal    Assessment   Post ablative hypothyroidism:   She is currently on a regimen of 112 g, 6-1/2 days a week and is compliant with this She feels fairly good overall except for her other general medical issues including shortness of breath TSH is quite normal now  She can now follow-up in 6 months    Refoel Palladino 04/01/2016, 3:42 PM

## 2016-04-02 DIAGNOSIS — G894 Chronic pain syndrome: Secondary | ICD-10-CM | POA: Diagnosis not present

## 2016-04-02 DIAGNOSIS — M25569 Pain in unspecified knee: Secondary | ICD-10-CM | POA: Diagnosis not present

## 2016-04-02 DIAGNOSIS — R51 Headache: Secondary | ICD-10-CM | POA: Diagnosis not present

## 2016-04-02 DIAGNOSIS — G500 Trigeminal neuralgia: Secondary | ICD-10-CM | POA: Diagnosis not present

## 2016-04-02 DIAGNOSIS — Z79891 Long term (current) use of opiate analgesic: Secondary | ICD-10-CM | POA: Diagnosis not present

## 2016-04-02 DIAGNOSIS — Z79899 Other long term (current) drug therapy: Secondary | ICD-10-CM | POA: Diagnosis not present

## 2016-04-04 ENCOUNTER — Ambulatory Visit (INDEPENDENT_AMBULATORY_CARE_PROVIDER_SITE_OTHER): Payer: Medicare Other

## 2016-04-04 ENCOUNTER — Telehealth: Payer: Self-pay

## 2016-04-04 DIAGNOSIS — Z95810 Presence of automatic (implantable) cardiac defibrillator: Secondary | ICD-10-CM | POA: Diagnosis not present

## 2016-04-04 DIAGNOSIS — I5022 Chronic systolic (congestive) heart failure: Secondary | ICD-10-CM | POA: Diagnosis not present

## 2016-04-04 NOTE — Telephone Encounter (Signed)
Remote ICM transmission received.  Attempted patient call and left message to return call.   

## 2016-04-04 NOTE — Progress Notes (Signed)
EPIC Encounter for ICM Monitoring  Patient Name: [name] is a 71 y.o. female Date: 04/04/2016 Primary Care Physican: Shirline Frees, MD Primary Cardiologist:Skains Electrophysiologist: Faustino Congress Weight:unknown  Bi-V Pacing: 93%      Attempted ICM call and unable to reach.  Left message to return call.  Transmission reviewed.   Thoracic impedance abnormal suggesting fluid accumulation.  Labs: 01/29/2016 Creatinine 1.19, BUN 19, Potassium 4.5, Sodium 140   Recommendations: Provided ICM call back number.    Follow-up plan: ICM clinic phone appointment on 04/14/2016.  Office appointment with Dr Marlou Porch 04/29/2016  Copy of ICM check sent to primary cardiologist and device physician.   3 month ICM trend : 04/04/2016   1 Year ICM trend:      [name], RN 04/04/2016 8:19 AM

## 2016-04-08 ENCOUNTER — Telehealth: Payer: Self-pay

## 2016-04-08 NOTE — Telephone Encounter (Signed)
Attempted ICM return call after patient left voice mail today.  Person answering phone stated she was asleep.  Will attempt call back later time.

## 2016-04-08 NOTE — Progress Notes (Signed)
Updated transmission for today received.  Suggests patient is returning to baseline today but she still has swollen legs.

## 2016-04-08 NOTE — Progress Notes (Signed)
Spoke with patient.  She reported her legs are swollen and has some fluid.  Weight remains stable at 165 lbs.  She denied any breathing difficulties.  04/04/16 remote transmission reviewed and advised it was showing fluid accumulation.  Requested she send updated ICM remote transmission today but the monitor was not working when she attempted to send it.  Provided tech service number.    Confirmed current dosage she is taking is Furosemide 80 mg 1 tablet daily and Potassium 20 mEq 1 tablet day.   Last labs in Epic 01/29/2016 Creatinine 1.19, BUN 19, Potassium 4.5, Sodium 140  Advised will send copy of ICM note to Dr Marlou Porch and Dr Caryl Comes for review and recommendations.

## 2016-04-09 ENCOUNTER — Other Ambulatory Visit: Payer: Self-pay | Admitting: *Deleted

## 2016-04-09 MED ORDER — RAMIPRIL 2.5 MG PO CAPS: ORAL_CAPSULE | ORAL | 0 refills | Status: DC

## 2016-04-10 NOTE — Progress Notes (Signed)
Call back to patient and provided Dr Marlou Porch recommendation to take Furosemide 80 mg 1 tablet bid x 3 days only and after 3rd day return to 1 tablet daily.  Advised to use TED hose, elevate legs, follow low salt diet and limit fluids to maximum of 50 oz a day.  She wrote down instructions and verbalized understanding.  She reported today her legs are still very swollen from knee down and red.  Advised if the redness and/or swelling worsen, she has a fever or her legs still leaking fluid to use the local emergency room if needed.  Advised to monitor for infection.

## 2016-04-10 NOTE — Telephone Encounter (Signed)
Spoke with patient.. See ICM note 

## 2016-04-10 NOTE — Progress Notes (Signed)
.   Jerline Pain, MD  [name], RN        Take lasix BID for 3 days then back to QD. TED hose. Elevate. Low salt. Fluid 1-1.5L.   Thanks  Candee Furbish, MD

## 2016-04-10 NOTE — Progress Notes (Signed)
Attempted call to patient to provide Dr Marlou Porch recommendation.  No answer and no answering machine.

## 2016-04-14 ENCOUNTER — Ambulatory Visit (INDEPENDENT_AMBULATORY_CARE_PROVIDER_SITE_OTHER): Payer: Medicare Other

## 2016-04-14 DIAGNOSIS — I5022 Chronic systolic (congestive) heart failure: Secondary | ICD-10-CM

## 2016-04-14 DIAGNOSIS — Z95810 Presence of automatic (implantable) cardiac defibrillator: Secondary | ICD-10-CM

## 2016-04-14 NOTE — Progress Notes (Signed)
EPIC Encounter for ICM Monitoring  Patient Name: [name] is a 71 y.o. female Date: 04/14/2016 Primary Care Physican: Shirline Frees, MD Primary Cardiologist:Skains Electrophysiologist: Faustino Congress Weight:unknown Bi-V Pacing: 93%             Heart Failure questions reviewed, pt asymptomatic.   Thoracic impedance returned to normal after increase in Lasix x 3 days.  She confirmed she has returned to prescribed dosage.  Recommendations: No changes. Discussed limiting dietary salt intake to 2000 mg/day and fluid intake to < 2 liters per day. Encouraged to call for fluid symptoms.  Follow-up plan: ICM clinic phone appointment on 04/28/2016, day before office visit with Dr Marlou Porch.   Office visit with Dr Caryl Comes on 05/06/2016..  Copy of ICM check sent to primary cardiologist and device physician.   3 month ICM trend: 04/14/2016   1 Year ICM trend:      [name], RN 04/14/2016 12:15 PM

## 2016-04-15 ENCOUNTER — Other Ambulatory Visit: Payer: Self-pay | Admitting: Endocrinology

## 2016-04-28 ENCOUNTER — Ambulatory Visit (INDEPENDENT_AMBULATORY_CARE_PROVIDER_SITE_OTHER): Payer: Medicare Other

## 2016-04-28 DIAGNOSIS — I5022 Chronic systolic (congestive) heart failure: Secondary | ICD-10-CM

## 2016-04-28 DIAGNOSIS — Z95810 Presence of automatic (implantable) cardiac defibrillator: Secondary | ICD-10-CM

## 2016-04-28 NOTE — Progress Notes (Signed)
EPIC Encounter for ICM Monitoring  Patient Name: [name] is a 71 y.o. female Date: 04/28/2016 Primary Care Physican: Shirline Frees, MD Primary Cardiologist:Skains Electrophysiologist: Faustino Congress Weight:unknown Bi-V Pacing: 93%       Heart Failure questions reviewed, pt reported feeling a little short of breath.  Thoracic impedance abnormal suggesting fluid accumulation for the last few days.   Recommendations:  Advised would send copy of ICM note for Dr Marlou Porch review since she has an appointment with him tomorrow, 04/29/2016.   Dr Marlou Porch will make recommendations at office appointment if needed  Follow-up plan: ICM clinic phone appointment on 06/09/2016.  Office defib check appointment on 05/06/2016 with Dr Caryl Comes  Copy of ICM check sent to primary cardiologist and device physician.   3 month ICM trend: 04/28/2016   1 Year ICM trend:      [name], RN 04/28/2016 1:39 PM

## 2016-04-29 ENCOUNTER — Ambulatory Visit (INDEPENDENT_AMBULATORY_CARE_PROVIDER_SITE_OTHER): Payer: Medicare Other | Admitting: Cardiology

## 2016-04-29 ENCOUNTER — Other Ambulatory Visit: Payer: Medicare Other

## 2016-04-29 ENCOUNTER — Encounter (INDEPENDENT_AMBULATORY_CARE_PROVIDER_SITE_OTHER): Payer: Self-pay

## 2016-04-29 ENCOUNTER — Encounter: Payer: Self-pay | Admitting: Cardiology

## 2016-04-29 VITALS — BP 108/68 | HR 74 | Ht 63.0 in | Wt 167.4 lb

## 2016-04-29 DIAGNOSIS — I2510 Atherosclerotic heart disease of native coronary artery without angina pectoris: Secondary | ICD-10-CM

## 2016-04-29 DIAGNOSIS — R51 Headache: Secondary | ICD-10-CM | POA: Diagnosis not present

## 2016-04-29 DIAGNOSIS — Z95810 Presence of automatic (implantable) cardiac defibrillator: Secondary | ICD-10-CM

## 2016-04-29 DIAGNOSIS — I10 Essential (primary) hypertension: Secondary | ICD-10-CM | POA: Diagnosis not present

## 2016-04-29 DIAGNOSIS — Z79899 Other long term (current) drug therapy: Secondary | ICD-10-CM | POA: Diagnosis not present

## 2016-04-29 DIAGNOSIS — I5022 Chronic systolic (congestive) heart failure: Secondary | ICD-10-CM | POA: Diagnosis not present

## 2016-04-29 DIAGNOSIS — I2583 Coronary atherosclerosis due to lipid rich plaque: Secondary | ICD-10-CM

## 2016-04-29 DIAGNOSIS — Z720 Tobacco use: Secondary | ICD-10-CM

## 2016-04-29 DIAGNOSIS — G894 Chronic pain syndrome: Secondary | ICD-10-CM | POA: Diagnosis not present

## 2016-04-29 DIAGNOSIS — M25569 Pain in unspecified knee: Secondary | ICD-10-CM | POA: Diagnosis not present

## 2016-04-29 DIAGNOSIS — Z79891 Long term (current) use of opiate analgesic: Secondary | ICD-10-CM | POA: Diagnosis not present

## 2016-04-29 DIAGNOSIS — G500 Trigeminal neuralgia: Secondary | ICD-10-CM | POA: Diagnosis not present

## 2016-04-29 NOTE — Progress Notes (Signed)
[facility] [address], [address] [place], [place]  [address] Phone: [phone] Fax:  [phone]  Date:  04/29/2016   ID:  [name], DOB [birth year], MRN [id number]  PCP:  Shirline Frees, MD   History of Present Illness: [name] is a 71 y.o. female with ischemic cardiomyopathy ejection fraction in the 15-20% range, Ischemic cardiomyopathy status post Bi-V pacemaker, coronary artery disease with bare-metal stent implantation for in-stent restenosis LAD.  She underwent a nuclear stress test  which was high risk, EF 19%, large area of infarct.  She has had 2 episodes of chest discomfort that was quite severe and took 2 nitroglycerin. This subsided. She's no longer having any discomfort.  Dr. Caryl Comes  provided her with AV optimization of her device. She was told that her device at baseline was fairly synchronized.  She is battled with long-standing depression.    In 2012, we had a long discussion about her cardiomyopathy and overall prognosis.   Sometimes she can feel shortness of breath when walking through her house. She is still smoking. Sometimes she is feeling somewhat dizzy, tired.  No chest pain. Overall no recent falls.    Wt Readings from Last 3 Encounters:  04/29/16 167 lb 6.4 oz (75.9 kg)  04/01/16 167 lb (75.8 kg)  12/31/15 163 lb (73.9 kg)     Past Medical History:  Diagnosis Date  . Anemia   . CAD (coronary artery disease)    Prior LAD stenting with stent thrombosis occurring in a withdrawal phase of her Plavix undertaken because of trigeminal neuralgia and release surgery. It was treated medically.  . Cancer ([place])    small bladder cancer removed no  treatment  . Cataract   . CHF (congestive heart failure) ([place])   . Complication of anesthesia    massive heart attack  . GERD (gastroesophageal reflux disease)   . Hyperlipidemia   . Ischemic cardiomyopathy   . Osteoarthritis   . SOB (shortness of breath)     Past Surgical History:    Procedure Laterality Date  . APPENDECTOMY    . BACK SURGERY     x2 back surgeries  . BI-VENTRICULAR IMPLANTABLE CARDIOVERTER DEFIBRILLATOR  (CRT-D)  2009; 05/2013   STJ CRTD generator change by Dr Caryl Comes 05/2013  . BIV PACEMAKER GENERATOR CHANGE OUT Bilateral 05/11/2013   Procedure: BIV PACEMAKER GENERATOR CHANGE OUT;  Surgeon: Deboraha Sprang, MD;  Location: [facility] CATH LAB;  Service: Cardiovascular;  Laterality: Bilateral;  . BRAIN SURGERY     x3  . EYE SURGERY Right 05/24/12  . JOINT REPLACEMENT     thumbs and knees replacement  . TUBAL LIGATION      Current Outpatient Prescriptions  Medication Sig Dispense Refill  . aspirin EC 81 MG tablet Take 1 tablet (81 mg total) by mouth daily.    . carbamazepine (TEGRETOL) 100 MG chewable tablet Chew 100 mg by mouth 2 (two) times daily.   2  . carboxymethylcellulose (REFRESH TEARS) 0.5 % SOLN Place 1 drop into both eyes daily.     . carvedilol (COREG) 3.125 MG tablet Take 1 tablet (3.125 mg total) by mouth 2 (two) times daily with a meal. 60 tablet 11  . clorazepate (TRANXENE) 7.5 MG tablet Take 7.5 mg by mouth at bedtime.   4  . cyclobenzaprine (FLEXERIL) 5 MG tablet TAKE 1 TABLET BY MOUTH 3 TIMES A DAY AS NEEDED FOR MUSCLE SPASMS  0  . diclofenac sodium (VOLTAREN) 1 %  GEL Apply 1 application topically 3 (three) times daily. Apply to knees and hands 5 Tube 3  . furosemide (LASIX) 80 MG tablet Take 1 tablet (80 mg total) by mouth daily. 30 tablet 7  . gabapentin (NEURONTIN) 800 MG tablet Take 800 mg by mouth 4 (four) times daily as needed. For pain    . iron polysaccharides (NIFEREX) 150 MG capsule Take 150 mg by mouth 2 (two) times daily.    Marland Kitchen levothyroxine (SYNTHROID, LEVOTHROID) 112 MCG tablet Take 1 tablet (112 mcg total) by mouth daily. 30 tablet 3  . mirtazapine (REMERON) 30 MG tablet Take 30 mg by mouth at bedtime.  5  . nitroGLYCERIN (NITROSTAT) 0.4 MG SL tablet TAKE 1 TABLET AS DIRECTED SUBLINGUALLY AS DIRECTED FOR CHEST PAIN 25 tablet 1  .  NONFORMULARY OR COMPOUNDED ITEM Apply 1-2 g topically 4 (four) times daily. 120 each 2  . Oxycodone HCl 20 MG TABS Take 1 tablet (20 mg total) by mouth every 6 (six) hours as needed (pain). 100 tablet 0  . potassium chloride SA (K-DUR,KLOR-CON) 20 MEQ tablet TAKE ONE TABLET BY MOUTH DAILY 30 tablet 8  . promethazine (PHENERGAN) 25 MG tablet Take 1 tablet (25 mg total) by mouth every 6 (six) hours as needed. for nasuea 90 tablet 1  . ramipril (ALTACE) 2.5 MG capsule TAKE 1 CAPSULE (2.5 MG TOTAL) BY MOUTH DAILY. 90 capsule 0  . simvastatin (ZOCOR) 10 MG tablet TAKE ONE TABLET BY MOUTH EVERY DAY 90 tablet 3  . spironolactone (ALDACTONE) 25 MG tablet TAKE 1/2 TABLET BY MOUTH EVERY DAY 45 tablet 2  . valACYclovir (VALTREX) 500 MG tablet Take 1 tablet by mouth as directed.    . venlafaxine XR (EFFEXOR-XR) 150 MG 24 hr capsule Take 150 mg by mouth daily with breakfast.   4  . zolpidem (AMBIEN) 10 MG tablet Take 10 mg by mouth at bedtime. For sleep     Current Facility-Administered Medications  Medication Dose Route Frequency Provider Last Rate Last Dose  . betamethasone acetate-betamethasone sodium phosphate (CELESTONE) injection 3 mg  3 mg Intramuscular Once Edrick Kins, DPM        Allergies:   No Known Allergies  Social History:  The patient  reports that she has been smoking Cigarettes.  She has a 15.00 pack-year smoking history. She has never used smokeless tobacco. She reports that she does not drink alcohol or use drugs.   ROS:  Please see the history of present illness.   Positive for depression, cardiomyopathy. No syncope, no fevers, no chest pain, no significant shortness of breath    PHYSICAL EXAM: VS:  BP 108/68   Pulse 74   Ht 5\' 3"  (1.6 m)   Wt 167 lb 6.4 oz (75.9 kg)   LMP  (LMP Unknown)   BMI 29.65 kg/m   thin,  in no acute distress  HEENT: normal. Corner sewn right eye Neck: no JVD  Cardiac:  normal S1, S2; RRR; no murmur  Lungs:  clear to auscultation bilaterally, no  wheezing, rhonchi or rales  Abd: soft, nontender, no hepatomegaly  Ext: no edema  Skin: warm and dry  EKG:  EKG performed today 04/29/16-ventricular pacing, biventricular pacing. Heart rate 74 bpm. 04/26/14 - Ventricular pacing, sinus rhythm, 87.     NUC stress 04/19/15:  Nuclear stress EF: 19%.  This is a high risk study.  Findings consistent with prior myocardial infarction.  The left ventricular ejection fraction is severely decreased (<30%).  High risk  stress nuclear study with large, severe, fixed anterior, septal, apical and distal inferior defect consistent with prior infarct; no ischemia; EF 19 with akinesis of the anteroseptal wall and apex; severe LVE; study high risk due to reduced LV function.  ASSESSMENT AND PLAN:  Chronic systolic heart failure-currently well compensated. Ejection fraction of 19% range.  NYHA class II symptoms. BP previously in the 80s.  now improved since changing her medication dosage. Still warn her about risks of driving because of her medications and risks to others on the road. She would not be a candidate for Entresto based upon BP.   Biventricular ICD-fairly well synchronized according to review of Dr. Olin Pia note. Impedance was recently increased. I have asked her to take Lasix 80 mg twice a day for the next 3 days and then go back to 80 mg once a day thereafter.  Hypertension-well-controlled.   Tobacco use-encouraged cessation. Albuterol. For the first time today she said she was actually think about quitting.  6 month follow up or sooner .  Signed, Candee Furbish, MD [facility]  04/29/2016 3:21 PM

## 2016-04-29 NOTE — Patient Instructions (Signed)
Medication Instructions:  Please increase your Furosemide as instructed for the next 3 days then return to your normal dose. Continue all other medications as listed.  Follow-Up: Follow up in 6 months with [name].  You will receive a letter in the mail 2 months before you are due.  Please call us when you receive this letter to schedule your follow up appointment.  If you need a refill on your cardiac medications before your next appointment, please call your pharmacy.  Thank you for choosing [facility]!!

## 2016-05-06 ENCOUNTER — Encounter: Payer: Medicare Other | Admitting: Internal Medicine

## 2016-05-15 DIAGNOSIS — Z79891 Long term (current) use of opiate analgesic: Secondary | ICD-10-CM | POA: Diagnosis not present

## 2016-05-15 DIAGNOSIS — G894 Chronic pain syndrome: Secondary | ICD-10-CM | POA: Diagnosis not present

## 2016-05-15 DIAGNOSIS — Z79899 Other long term (current) drug therapy: Secondary | ICD-10-CM | POA: Diagnosis not present

## 2016-05-15 DIAGNOSIS — R51 Headache: Secondary | ICD-10-CM | POA: Diagnosis not present

## 2016-05-15 DIAGNOSIS — M25569 Pain in unspecified knee: Secondary | ICD-10-CM | POA: Diagnosis not present

## 2016-05-15 DIAGNOSIS — G500 Trigeminal neuralgia: Secondary | ICD-10-CM | POA: Diagnosis not present

## 2016-05-19 DIAGNOSIS — G44091 Other trigeminal autonomic cephalgias (TAC), intractable: Secondary | ICD-10-CM | POA: Diagnosis not present

## 2016-05-21 DIAGNOSIS — G44091 Other trigeminal autonomic cephalgias (TAC), intractable: Secondary | ICD-10-CM | POA: Diagnosis not present

## 2016-05-28 DIAGNOSIS — F339 Major depressive disorder, recurrent, unspecified: Secondary | ICD-10-CM | POA: Diagnosis not present

## 2016-06-02 DIAGNOSIS — Z79899 Other long term (current) drug therapy: Secondary | ICD-10-CM | POA: Diagnosis not present

## 2016-06-02 DIAGNOSIS — G500 Trigeminal neuralgia: Secondary | ICD-10-CM | POA: Diagnosis not present

## 2016-06-02 DIAGNOSIS — R51 Headache: Secondary | ICD-10-CM | POA: Diagnosis not present

## 2016-06-02 DIAGNOSIS — Z79891 Long term (current) use of opiate analgesic: Secondary | ICD-10-CM | POA: Diagnosis not present

## 2016-06-02 DIAGNOSIS — G894 Chronic pain syndrome: Secondary | ICD-10-CM | POA: Diagnosis not present

## 2016-06-09 ENCOUNTER — Ambulatory Visit (INDEPENDENT_AMBULATORY_CARE_PROVIDER_SITE_OTHER): Payer: Medicare Other

## 2016-06-09 DIAGNOSIS — I5022 Chronic systolic (congestive) heart failure: Secondary | ICD-10-CM

## 2016-06-09 DIAGNOSIS — Z95810 Presence of automatic (implantable) cardiac defibrillator: Secondary | ICD-10-CM

## 2016-06-10 NOTE — Progress Notes (Signed)
EPIC Encounter for ICM Monitoring  Patient Name: [name] is a 71 y.o. female Date: 06/10/2016 Primary Care Physican: Shirline Frees, MD Primary Cardiologist:[place] Electrophysiologist: Faustino Congress Weight:unknown Bi-V Pacing: 93%      Transmission reviewed.   Thoracic impedance normal .  Prescribed dosage: Furosemide 80 mg 1 tablet daily and Potassium 20 mEq 1 tablet daily  Labs:  01/29/2016 Creatinine 1.19, BUN 19, Potassium 4.5, Sodium 140  Recommendations: None  Follow-up plan: ICM clinic phone appointment on 08/05/2016 since she has a defib office check with Dr Caryl Comes on 07/02/2016  Copy of ICM check sent to device physician.   3 month ICM trend: 06/09/2016   1 Year ICM trend:      [name], RN 06/10/2016 2:16 PM

## 2016-06-18 ENCOUNTER — Encounter (HOSPITAL_COMMUNITY): Payer: Self-pay | Admitting: Emergency Medicine

## 2016-06-18 ENCOUNTER — Ambulatory Visit (HOSPITAL_COMMUNITY)
Admission: EM | Admit: 2016-06-18 | Discharge: 2016-06-18 | Disposition: A | Discharge status: Home / Self Care | Payer: Medicare Other | Attending: Emergency Medicine | Admitting: Emergency Medicine

## 2016-06-18 DIAGNOSIS — G8929 Other chronic pain: Secondary | ICD-10-CM | POA: Diagnosis not present

## 2016-06-18 DIAGNOSIS — M545 Low back pain: Secondary | ICD-10-CM

## 2016-06-18 MED ORDER — CYCLOBENZAPRINE HCL 5 MG PO TABS
5.0000 mg | ORAL_TABLET | Freq: Three times a day (TID) | ORAL | 0 refills | Status: DC | PRN
Start: 2016-06-18 — End: 2018-07-14

## 2016-06-18 NOTE — Discharge Instructions (Signed)
For your lower back pain, I have refilled her Flexeril. I also recommend you take over-the-counter Tylenol every 4-6 hours, not to exceed 4000 mg and any 24-hour period. I also recommend rest, you may apply ice to the affected area 15 minutes at a time up to 4 times a day. Should your pain persist, or fail to resolve, and follow up with your primary care provider.

## 2016-06-18 NOTE — ED Triage Notes (Signed)
Fell on back Saturday.  Patient was making a bed when she fell.  Pain with movement and deep inspiration.

## 2016-06-18 NOTE — ED Provider Notes (Signed)
CSN: [id number]     Arrival date & time 06/18/16  1536 History   None    Chief Complaint  Patient presents with  . Back Pain   (Consider location/radiation/quality/duration/timing/severity/associated sxs/prior Treatment) 71 year old female presents to clinic for evaluation of lower back pain. States she had fallen on Saturday, approximately 4 days ago. She states she was pulling some blankets off of her bed changer linens, when she tripped and fell over backwards. She did not hit her head, had no loss of consciousness, no headache, blurred vision, denies any nausea, no amnesia surrounding the events. She is able to walk afterwards, has had no loss of function in her distal extremities, no loss of control of bowel function. She has had a history of back surgery, and osteoarthritis, no known history of osteoporosis or other bone weakness.   The history is provided by the patient.  Back Pain  Location:  Lumbar spine Quality:  Aching Radiates to:  Does not radiate Pain severity:  Moderate Pain is:  Worse during the day Onset quality:  Gradual Duration:  4 days Timing:  Constant Progression:  Worsening Chronicity:  New Context: falling   Context: not lifting heavy objects, not MCA, not MVA, not occupational injury, not physical stress, not recent illness and not twisting   Relieved by:  Lying down (position) Worsened by:  Twisting, standing, deep breathing and bending Ineffective treatments: OTC medications. Associated symptoms: no abdominal pain, no bowel incontinence, no chest pain, no fever, no headaches, no numbness, no paresthesias, no tingling and no weakness     Past Medical History:  Diagnosis Date  . Anemia   . CAD (coronary artery disease)    Prior LAD stenting with stent thrombosis occurring in a withdrawal phase of her Plavix undertaken because of trigeminal neuralgia and release surgery. It was treated medically.  . Cancer ([place])    small bladder cancer removed no   treatment  . Cataract   . CHF (congestive heart failure) ([place])   . Complication of anesthesia    massive heart attack  . GERD (gastroesophageal reflux disease)   . Hyperlipidemia   . Ischemic cardiomyopathy   . Osteoarthritis   . SOB (shortness of breath)    Past Surgical History:  Procedure Laterality Date  . APPENDECTOMY    . BACK SURGERY     x2 back surgeries  . BI-VENTRICULAR IMPLANTABLE CARDIOVERTER DEFIBRILLATOR  (CRT-D)  2009; 05/2013   STJ CRTD generator change by Dr Caryl Comes 05/2013  . BIV PACEMAKER GENERATOR CHANGE OUT Bilateral 05/11/2013   Procedure: BIV PACEMAKER GENERATOR CHANGE OUT;  Surgeon: Deboraha Sprang, MD;  Location[facility] CATH LAB;  Service: Cardiovascular;  Laterality: Bilateral;  . BRAIN SURGERY     x3  . EYE SURGERY Right 05/24/12  . JOINT REPLACEMENT     thumbs and knees replacement  . TUBAL LIGATION     Family History  Problem Relation Age of Onset  . Heart disease Father    Social History  Substance Use Topics  . Smoking status: Current Some Day Smoker    Packs/day: 0.50    Years: 30.00    Types: Cigarettes  . Smokeless tobacco: Never Used  . Alcohol use No   OB History    No data available     Review of Systems  Constitutional: Negative for chills and fever.  HENT: Negative.   Respiratory: Negative for cough, choking, chest tightness, shortness of breath and wheezing.   Cardiovascular: Negative for chest pain, palpitations  and leg swelling.  Gastrointestinal: Negative for abdominal pain, bowel incontinence, constipation, diarrhea, nausea and vomiting.  Musculoskeletal: Positive for back pain. Negative for gait problem, neck pain and neck stiffness.  Skin: Negative.   Neurological: Negative for tingling, weakness, numbness, headaches and paresthesias.  All other systems reviewed and are negative.   Allergies  Patient has no known allergies.  Home Medications   Prior to Admission medications   Medication Sig Start Date End Date Taking?  Authorizing Provider  aspirin EC 81 MG tablet Take 1 tablet (81 mg total) by mouth daily. 04/19/15   Deboraha Sprang, MD  carbamazepine (TEGRETOL) 100 MG chewable tablet Chew 100 mg by mouth 2 (two) times daily.  10/20/15   Historical Provider, MD  carboxymethylcellulose (REFRESH TEARS) 0.5 % SOLN Place 1 drop into both eyes daily.     Historical Provider, MD  carvedilol (COREG) 3.125 MG tablet Take 1 tablet (3.125 mg total) by mouth 2 (two) times daily with a meal. 09/13/15   Jerline Pain, MD  clorazepate (TRANXENE) 7.5 MG tablet Take 7.5 mg by mouth at bedtime.  09/25/14   Historical Provider, MD  cyclobenzaprine (FLEXERIL) 5 MG tablet TAKE 1 TABLET BY MOUTH 3 TIMES A DAY AS NEEDED FOR MUSCLE SPASMS 10/16/14   Historical Provider, MD  cyclobenzaprine (FLEXERIL) 5 MG tablet Take 1 tablet (5 mg total) by mouth 3 (three) times daily as needed for muscle spasms. 06/18/16   Barnet Glasgow, NP  diclofenac sodium (VOLTAREN) 1 % GEL Apply 1 application topically 3 (three) times daily. Apply to knees and hands 04/25/15   Meredith Staggers, MD  furosemide (LASIX) 80 MG tablet Take 1 tablet (80 mg total) by mouth daily. 02/27/16   Jerline Pain, MD  gabapentin (NEURONTIN) 800 MG tablet Take 800 mg by mouth 4 (four) times daily as needed. For pain 10/23/10   Historical Provider, MD  iron polysaccharides (NIFEREX) 150 MG capsule Take 150 mg by mouth 2 (two) times daily.    Historical Provider, MD  levothyroxine (SYNTHROID, LEVOTHROID) 112 MCG tablet Take 1 tablet (112 mcg total) by mouth daily. 04/15/16   Elayne Snare, MD  mirtazapine (REMERON) 30 MG tablet Take 30 mg by mouth at bedtime. 05/28/15   Historical Provider, MD  nitroGLYCERIN (NITROSTAT) 0.4 MG SL tablet TAKE 1 TABLET AS DIRECTED SUBLINGUALLY AS DIRECTED FOR CHEST PAIN 03/27/15   Liliane Shi, PA-C  NONFORMULARY OR COMPOUNDED ITEM Apply 1-2 g topically 4 (four) times daily. 01/03/16   Edrick Kins, DPM  Oxycodone HCl 20 MG TABS Take 1 tablet (20 mg total) by  mouth every 6 (six) hours as needed (pain). 06/04/15   Meredith Staggers, MD  potassium chloride SA (K-DUR,KLOR-CON) 20 MEQ tablet TAKE ONE TABLET BY MOUTH DAILY 01/16/16   Jerline Pain, MD  promethazine (PHENERGAN) 25 MG tablet Take 1 tablet (25 mg total) by mouth every 6 (six) hours as needed. for nasuea 01/25/13   Aundria Mems, PA-C  ramipril (ALTACE) 2.5 MG capsule TAKE 1 CAPSULE (2.5 MG TOTAL) BY MOUTH DAILY. 04/09/16   Jerline Pain, MD  simvastatin (ZOCOR) 10 MG tablet TAKE ONE TABLET BY MOUTH EVERY DAY 02/07/16   Jerline Pain, MD  spironolactone (ALDACTONE) 25 MG tablet TAKE 1/2 TABLET BY MOUTH EVERY DAY 03/04/16   Jerline Pain, MD  valACYclovir (VALTREX) 500 MG tablet Take 1 tablet by mouth as directed. 07/20/13   Historical Provider, MD  venlafaxine XR (EFFEXOR-XR) 150 MG 24  hr capsule Take 150 mg by mouth daily with breakfast.  09/25/14   Historical Provider, MD  zolpidem (AMBIEN) 10 MG tablet Take 10 mg by mouth at bedtime. For sleep 09/23/10   Historical Provider, MD   Meds Ordered and Administered this Visit  Medications - No data to display  BP 100/64 (BP Location: Left Arm)   Pulse 76   Temp 98.4 F (36.9 C) (Oral)   Resp (!) 24   LMP  (LMP Unknown)   SpO2 97%  No data found.   Physical Exam  Constitutional: She is oriented to person, place, and time. She appears well-developed and well-nourished. No distress.  HENT:  Head: Normocephalic and atraumatic.  Right Ear: External ear normal.  Left Ear: External ear normal.  Neck: Normal range of motion. Neck supple.  Cardiovascular: Normal rate and regular rhythm.   Pulmonary/Chest: Effort normal and breath sounds normal.  Musculoskeletal: She exhibits no edema.       Lumbar back: She exhibits tenderness and pain. She exhibits normal range of motion, no bony tenderness, no swelling, no edema and no deformity.  Lymphadenopathy:    She has no cervical adenopathy.  Neurological: She is alert and oriented to person, place, and  time.  Skin: Skin is warm and dry. Capillary refill takes less than 2 seconds. She is not diaphoretic.  Psychiatric: She has a normal mood and affect. Her behavior is normal.  Nursing note and vitals reviewed.   Urgent Care Course     Procedures (including critical care time)  Labs Review Labs Reviewed - No data to display  Imaging Review No results found.    MDM   1. Chronic right-sided low back pain without sciatica    Refilled patient's flexeril prescription, advised to take OTC Tylenol for pain management. Recommended following up with primary care provider for further management of pain.    Barnet Glasgow, NP 06/19/16 1047

## 2016-06-26 ENCOUNTER — Other Ambulatory Visit: Payer: Self-pay | Admitting: *Deleted

## 2016-06-26 MED ORDER — RAMIPRIL 2.5 MG PO CAPS
ORAL_CAPSULE | ORAL | 2 refills | Status: DC
Start: 2016-06-26 — End: 2017-07-02

## 2016-07-02 ENCOUNTER — Ambulatory Visit (INDEPENDENT_AMBULATORY_CARE_PROVIDER_SITE_OTHER): Payer: Medicare Other | Admitting: Internal Medicine

## 2016-07-02 ENCOUNTER — Encounter (INDEPENDENT_AMBULATORY_CARE_PROVIDER_SITE_OTHER): Payer: Self-pay

## 2016-07-02 ENCOUNTER — Encounter: Payer: Self-pay | Admitting: Internal Medicine

## 2016-07-02 VITALS — BP 114/70 | HR 84 | Ht 64.0 in | Wt 169.0 lb

## 2016-07-02 DIAGNOSIS — I471 Supraventricular tachycardia, unspecified: Secondary | ICD-10-CM

## 2016-07-02 DIAGNOSIS — Z95810 Presence of automatic (implantable) cardiac defibrillator: Secondary | ICD-10-CM

## 2016-07-02 DIAGNOSIS — I5022 Chronic systolic (congestive) heart failure: Secondary | ICD-10-CM

## 2016-07-02 DIAGNOSIS — I255 Ischemic cardiomyopathy: Secondary | ICD-10-CM | POA: Diagnosis not present

## 2016-07-02 LAB — CUP PACEART INCLINIC DEVICE CHECK
Brady Statistic RA Percent Paced: 10 %
Date Time Interrogation Session: 20180404173541
HIGH POWER IMPEDANCE MEASURED VALUE: 36.4948
Implantable Lead Implant Date: 20090429
Implantable Lead Location: 753858
Implantable Lead Location: 753860
Lead Channel Impedance Value: 287.5 Ohm
Lead Channel Impedance Value: 450 Ohm
Lead Channel Pacing Threshold Amplitude: 0.75 V
Lead Channel Pacing Threshold Amplitude: 0.75 V
Lead Channel Pacing Threshold Amplitude: 1.25 V
Lead Channel Pacing Threshold Pulse Width: 0.5 ms
Lead Channel Sensing Intrinsic Amplitude: 1.6 mV
Lead Channel Setting Pacing Amplitude: 2 V
Lead Channel Setting Pacing Amplitude: 2.5 V
Lead Channel Setting Pacing Pulse Width: 1 ms
MDC IDC LEAD IMPLANT DT: 20090429
MDC IDC LEAD IMPLANT DT: 20090429
MDC IDC LEAD LOCATION: 753859
MDC IDC MSMT LEADCHNL RA PACING THRESHOLD PULSEWIDTH: 0.5 ms
MDC IDC MSMT LEADCHNL RV IMPEDANCE VALUE: 350 Ohm
MDC IDC MSMT LEADCHNL RV PACING THRESHOLD PULSEWIDTH: 1 ms
MDC IDC MSMT LEADCHNL RV SENSING INTR AMPL: 11.7 mV
MDC IDC PG IMPLANT DT: 20150211
MDC IDC SET LEADCHNL LV PACING AMPLITUDE: 1.5 V
MDC IDC SET LEADCHNL LV PACING PULSEWIDTH: 0.5 ms
MDC IDC SET LEADCHNL RV SENSING SENSITIVITY: 0.5 mV
MDC IDC STAT BRADY RV PERCENT PACED: 93 %
Pulse Gen Serial Number: 7170199

## 2016-07-02 NOTE — Progress Notes (Signed)
Patient Care Team: Shirline Frees, MD as PCP - General (Family Medicine)   HPI  [name] is a 71 y.o. female Seen in followup for ICD implantation. She has a CRT St. Jude device in 2009. She had a nonspecific IVCD. She does not recall significant interval benefit. She has a history of ischemic heart disease with prior MI She denies chest pain. She does have dyspnea on exertion.chronic and stable \ She continues to smoke. No edema     Records and Results Reviewed she was seen by [name] today.   Recent Myoview demonstrated high risk findings with an EF of 19%.     Past Medical History:  Diagnosis Date  . Anemia   . CAD (coronary artery disease)    Prior LAD stenting with stent thrombosis occurring in a withdrawal phase of her Plavix undertaken because of trigeminal neuralgia and release surgery. It was treated medically.  . Cancer ([place])    small bladder cancer removed no  treatment  . Cataract   . CHF (congestive heart failure) ([place])   . Complication of anesthesia    massive heart attack  . GERD (gastroesophageal reflux disease)   . Hyperlipidemia   . Ischemic cardiomyopathy   . Osteoarthritis   . SOB (shortness of breath)     Past Surgical History:  Procedure Laterality Date  . APPENDECTOMY    . BACK SURGERY     x2 back surgeries  . BI-VENTRICULAR IMPLANTABLE CARDIOVERTER DEFIBRILLATOR  (CRT-D)  2009; 05/2013   STJ CRTD generator change by Dr Caryl Comes 05/2013  . BIV PACEMAKER GENERATOR CHANGE OUT Bilateral 05/11/2013   Procedure: BIV PACEMAKER GENERATOR CHANGE OUT;  Surgeon: Deboraha Sprang, MD;  Location: [facility] CATH LAB;  Service: Cardiovascular;  Laterality: Bilateral;  . BRAIN SURGERY     x3  . EYE SURGERY Right 05/24/12  . JOINT REPLACEMENT     thumbs and knees replacement  . TUBAL LIGATION      Current Outpatient Prescriptions  Medication Sig Dispense Refill  . aspirin EC 81 MG tablet Take 1 tablet (81 mg total) by mouth daily.    . carbamazepine  (TEGRETOL) 100 MG chewable tablet Chew 100 mg by mouth 2 (two) times daily.   2  . carboxymethylcellulose (REFRESH TEARS) 0.5 % SOLN Place 1 drop into both eyes daily.     . carvedilol (COREG) 3.125 MG tablet Take 1 tablet (3.125 mg total) by mouth 2 (two) times daily with a meal. 60 tablet 11  . clorazepate (TRANXENE) 7.5 MG tablet Take 7.5 mg by mouth at bedtime.   4  . cyclobenzaprine (FLEXERIL) 5 MG tablet TAKE 1 TABLET BY MOUTH 3 TIMES A DAY AS NEEDED FOR MUSCLE SPASMS  0  . cyclobenzaprine (FLEXERIL) 5 MG tablet Take 1 tablet (5 mg total) by mouth 3 (three) times daily as needed for muscle spasms. 30 tablet 0  . diclofenac sodium (VOLTAREN) 1 % GEL Apply 1 application topically 3 (three) times daily. Apply to knees and hands 5 Tube 3  . furosemide (LASIX) 80 MG tablet Take 1 tablet (80 mg total) by mouth daily. 30 tablet 7  . gabapentin (NEURONTIN) 800 MG tablet Take 800 mg by mouth 4 (four) times daily as needed. For pain    . iron polysaccharides (NIFEREX) 150 MG capsule Take 150 mg by mouth 2 (two) times daily.    Marland Kitchen levothyroxine (SYNTHROID, LEVOTHROID) 112 MCG tablet Take 1 tablet (112 mcg total) by mouth  daily. 30 tablet 3  . mirtazapine (REMERON) 30 MG tablet Take 30 mg by mouth at bedtime.  5  . nitroGLYCERIN (NITROSTAT) 0.4 MG SL tablet TAKE 1 TABLET AS DIRECTED SUBLINGUALLY AS DIRECTED FOR CHEST PAIN 25 tablet 1  . NONFORMULARY OR COMPOUNDED ITEM Apply 1-2 g topically 4 (four) times daily. 120 each 2  . Oxycodone HCl 20 MG TABS Take 1 tablet (20 mg total) by mouth every 6 (six) hours as needed (pain). 100 tablet 0  . potassium chloride SA (K-DUR,KLOR-CON) 20 MEQ tablet TAKE ONE TABLET BY MOUTH DAILY 30 tablet 8  . promethazine (PHENERGAN) 25 MG tablet Take 1 tablet (25 mg total) by mouth every 6 (six) hours as needed. for nasuea 90 tablet 1  . ramipril (ALTACE) 2.5 MG capsule TAKE 1 CAPSULE (2.5 MG TOTAL) BY MOUTH DAILY. 90 capsule 2  . simvastatin (ZOCOR) 10 MG tablet TAKE ONE  TABLET BY MOUTH EVERY DAY 90 tablet 3  . spironolactone (ALDACTONE) 25 MG tablet TAKE 1/2 TABLET BY MOUTH EVERY DAY 45 tablet 2  . valACYclovir (VALTREX) 500 MG tablet Take 1 tablet by mouth as directed.    . venlafaxine XR (EFFEXOR-XR) 150 MG 24 hr capsule Take 150 mg by mouth daily with breakfast.   4  . zolpidem (AMBIEN) 10 MG tablet Take 10 mg by mouth at bedtime. For sleep     Current Facility-Administered Medications  Medication Dose Route Frequency Provider Last Rate Last Dose  . betamethasone acetate-betamethasone sodium phosphate (CELESTONE) injection 3 mg  3 mg Intramuscular Once Edrick Kins, DPM        No Known Allergies    Review of Systems negative except from HPI and PMH  Physical Exam BP 114/70   Pulse 84   Ht 5\' 4"  (1.626 m)   Wt 169 lb (76.7 kg)   LMP  (LMP Unknown)   SpO2 92%   BMI 29.01 kg/m  Well developed and well nourished in no acute distress HENT normal E scleral and icterus clear Neck Supple JVP flat; carotids brisk and full Clear to ausculation Device pocket well healed; without hematoma or erythema.  There is no tethering Regular rate and rhythm, no murmurs gallops or rub Soft with active bowel sounds No clubbing cyanosis Edema Alert and oriented, grossly normal motor and sensory function Skin Warm and Dry  ECG personally reviewed sinus rhythm with PVCs synchronous biventricular pacing QRS negative lead 1 positive lead V1 QRS duration 116 ms  Assessment and  Plan Ischemic cardiomyopathy   congestive heart failure-chronic-systolic  Implantable defibrillator-CRT-St. Jude  SVT-nonsustained   Cigarette abuse  Depression-  Best it has been     Device function is normal.\  Without symptoms of ischemia  Still smoking  Thinking about quitting

## 2016-07-03 DIAGNOSIS — F4542 Pain disorder with related psychological factors: Secondary | ICD-10-CM | POA: Diagnosis not present

## 2016-07-03 DIAGNOSIS — G894 Chronic pain syndrome: Secondary | ICD-10-CM | POA: Diagnosis not present

## 2016-07-03 DIAGNOSIS — G609 Hereditary and idiopathic neuropathy, unspecified: Secondary | ICD-10-CM | POA: Diagnosis not present

## 2016-07-03 DIAGNOSIS — Z79899 Other long term (current) drug therapy: Secondary | ICD-10-CM | POA: Diagnosis not present

## 2016-07-03 DIAGNOSIS — M792 Neuralgia and neuritis, unspecified: Secondary | ICD-10-CM | POA: Diagnosis not present

## 2016-07-03 DIAGNOSIS — Z79891 Long term (current) use of opiate analgesic: Secondary | ICD-10-CM | POA: Diagnosis not present

## 2016-07-07 DIAGNOSIS — Z79899 Other long term (current) drug therapy: Secondary | ICD-10-CM | POA: Diagnosis not present

## 2016-07-07 DIAGNOSIS — Z79891 Long term (current) use of opiate analgesic: Secondary | ICD-10-CM | POA: Diagnosis not present

## 2016-07-07 DIAGNOSIS — G500 Trigeminal neuralgia: Secondary | ICD-10-CM | POA: Diagnosis not present

## 2016-07-07 DIAGNOSIS — R51 Headache: Secondary | ICD-10-CM | POA: Diagnosis not present

## 2016-07-07 DIAGNOSIS — M79606 Pain in leg, unspecified: Secondary | ICD-10-CM | POA: Diagnosis not present

## 2016-07-07 DIAGNOSIS — G894 Chronic pain syndrome: Secondary | ICD-10-CM | POA: Diagnosis not present

## 2016-07-08 DIAGNOSIS — I5022 Chronic systolic (congestive) heart failure: Secondary | ICD-10-CM | POA: Diagnosis not present

## 2016-07-08 DIAGNOSIS — I2510 Atherosclerotic heart disease of native coronary artery without angina pectoris: Secondary | ICD-10-CM | POA: Diagnosis not present

## 2016-07-08 DIAGNOSIS — Z0000 Encounter for general adult medical examination without abnormal findings: Secondary | ICD-10-CM | POA: Diagnosis not present

## 2016-07-08 DIAGNOSIS — E7800 Pure hypercholesterolemia, unspecified: Secondary | ICD-10-CM | POA: Diagnosis not present

## 2016-07-18 ENCOUNTER — Telehealth: Payer: Self-pay | Admitting: Endocrinology

## 2016-07-18 NOTE — Telephone Encounter (Signed)
She has an appointment in July, as you can see from my previous note she is supposed come back in 6 months.  Please refill 3 times

## 2016-07-18 NOTE — Telephone Encounter (Signed)
Is this Rx okay to refill? Or does she need an OV first?  Please advise.  Thank you!  Thornton Papas.

## 2016-08-05 ENCOUNTER — Telehealth: Payer: Self-pay

## 2016-08-05 ENCOUNTER — Ambulatory Visit (INDEPENDENT_AMBULATORY_CARE_PROVIDER_SITE_OTHER): Payer: Medicare Other

## 2016-08-05 DIAGNOSIS — Z95810 Presence of automatic (implantable) cardiac defibrillator: Secondary | ICD-10-CM

## 2016-08-05 DIAGNOSIS — I5022 Chronic systolic (congestive) heart failure: Secondary | ICD-10-CM | POA: Diagnosis not present

## 2016-08-05 NOTE — Telephone Encounter (Signed)
Remote ICM transmission received.  Attempted patient call and left detailed message regarding transmission and next ICM scheduled for 09/05/2016.  Advised to return call for any fluid symptoms or questions.

## 2016-08-05 NOTE — Progress Notes (Signed)
EPIC Encounter for ICM Monitoring  Patient Name: [name] is a 71 y.o. female Date: 08/05/2016 Primary Care Physican: Shirline Frees, MD Primary Cardiologist:Skains Electrophysiologist: Faustino Congress Weight:unknown Bi-V Pacing: 90%      Attempted call to patient and unable to reach.  Left detailed message regarding transmission.  Transmission reviewed.    Thoracic impedance normal.  Prescribed dosage: Furosemide 80 mg 1 tablet daily and Potassium 20 mEq 1 tablet daily  Labs:  01/29/2016 Creatinine 1.19, BUN 19, Potassium 4.5, Sodium 140  Recommendations: Left voice mail with ICM number and encouraged to call for fluid symptoms.  Follow-up plan: ICM clinic phone appointment on 09/05/2016. .  Copy of ICM check sent to device physician.   3 month ICM trend: 08/05/2016   1 Year ICM trend:      [name], RN 08/05/2016 5:14 PM

## 2016-08-08 DIAGNOSIS — Z79899 Other long term (current) drug therapy: Secondary | ICD-10-CM | POA: Diagnosis not present

## 2016-08-08 DIAGNOSIS — G500 Trigeminal neuralgia: Secondary | ICD-10-CM | POA: Diagnosis not present

## 2016-08-08 DIAGNOSIS — G894 Chronic pain syndrome: Secondary | ICD-10-CM | POA: Diagnosis not present

## 2016-08-08 DIAGNOSIS — M79606 Pain in leg, unspecified: Secondary | ICD-10-CM | POA: Diagnosis not present

## 2016-08-08 DIAGNOSIS — Z79891 Long term (current) use of opiate analgesic: Secondary | ICD-10-CM | POA: Diagnosis not present

## 2016-08-08 DIAGNOSIS — R51 Headache: Secondary | ICD-10-CM | POA: Diagnosis not present

## 2016-08-18 ENCOUNTER — Other Ambulatory Visit: Payer: Self-pay | Admitting: Cardiology

## 2016-08-26 ENCOUNTER — Telehealth: Payer: Self-pay

## 2016-08-26 NOTE — Telephone Encounter (Signed)
Returned call to patient as requested.  She said she received my last message regarding ICM remote transmission.  She said she is doing fine but has some occasional swelling in feet but is resolved after she props her feet up.  Advised if swelling were to increase or not resolve to call.  Next ICM remote check 09/05/2016.

## 2016-09-01 ENCOUNTER — Other Ambulatory Visit (HOSPITAL_COMMUNITY): Payer: Self-pay | Admitting: Psychiatry

## 2016-09-03 DIAGNOSIS — G44091 Other trigeminal autonomic cephalgias (TAC), intractable: Secondary | ICD-10-CM | POA: Diagnosis not present

## 2016-09-05 ENCOUNTER — Telehealth: Payer: Self-pay

## 2016-09-05 ENCOUNTER — Ambulatory Visit (INDEPENDENT_AMBULATORY_CARE_PROVIDER_SITE_OTHER): Payer: Medicare Other

## 2016-09-05 DIAGNOSIS — G894 Chronic pain syndrome: Secondary | ICD-10-CM | POA: Diagnosis not present

## 2016-09-05 DIAGNOSIS — G500 Trigeminal neuralgia: Secondary | ICD-10-CM | POA: Diagnosis not present

## 2016-09-05 DIAGNOSIS — M79606 Pain in leg, unspecified: Secondary | ICD-10-CM | POA: Diagnosis not present

## 2016-09-05 DIAGNOSIS — Z95810 Presence of automatic (implantable) cardiac defibrillator: Secondary | ICD-10-CM | POA: Diagnosis not present

## 2016-09-05 DIAGNOSIS — R51 Headache: Secondary | ICD-10-CM | POA: Diagnosis not present

## 2016-09-05 DIAGNOSIS — I5022 Chronic systolic (congestive) heart failure: Secondary | ICD-10-CM | POA: Diagnosis not present

## 2016-09-05 NOTE — Progress Notes (Signed)
EPIC Encounter for ICM Monitoring  Patient Name: [name] is a 71 y.o. female Date: 09/05/2016 Primary Care Physican: Shirline Frees, MD Primary Cardiologist:Skains Electrophysiologist: Faustino Congress Weight:unknown Bi-V Pacing: 92%      Heart Failure questions reviewed, pt has had some swelling in lower legs but thinks it has improved.   Thoracic impedance normal today but was abnormal suggesting fluid accumulation from 08/30/2016 to 09/04/2016.  Prescribed dosage: Furosemide 80 mg 1 tablet daily and Potassium 20 mEq 1 tablet daily  Labs:  01/29/2016 Creatinine 1.19, BUN 19, Potassium 4.5, Sodium 140  Recommendations: No changes.  Advised to limit salt inake to 2000 mg.  Encouraged to call for fluid symptoms.  Follow-up plan: ICM clinic phone appointment on 10/06/2016.  Office appointment scheduled on 10/28/2016 with [name].  Copy of ICM check sent to primary cardiologist and device physician.   3 month ICM trend: 09/05/2016   1 Year ICM trend:      [name], RN 09/05/2016 12:24 PM

## 2016-09-05 NOTE — Telephone Encounter (Signed)
Attempted ICM call to patient and left message to send ICM remote transmission today.

## 2016-09-10 ENCOUNTER — Other Ambulatory Visit: Payer: Self-pay | Admitting: Cardiology

## 2016-09-18 ENCOUNTER — Telehealth: Payer: Self-pay | Admitting: Cardiology

## 2016-09-18 ENCOUNTER — Other Ambulatory Visit: Payer: Self-pay | Admitting: Physician Assistant

## 2016-09-18 DIAGNOSIS — Z95810 Presence of automatic (implantable) cardiac defibrillator: Secondary | ICD-10-CM

## 2016-09-18 DIAGNOSIS — E785 Hyperlipidemia, unspecified: Secondary | ICD-10-CM

## 2016-09-18 DIAGNOSIS — Z720 Tobacco use: Secondary | ICD-10-CM

## 2016-09-18 DIAGNOSIS — I255 Ischemic cardiomyopathy: Secondary | ICD-10-CM

## 2016-09-18 DIAGNOSIS — I25119 Atherosclerotic heart disease of native coronary artery with unspecified angina pectoris: Secondary | ICD-10-CM

## 2016-09-18 DIAGNOSIS — I5022 Chronic systolic (congestive) heart failure: Secondary | ICD-10-CM

## 2016-09-18 DIAGNOSIS — I10 Essential (primary) hypertension: Secondary | ICD-10-CM

## 2016-09-18 NOTE — Telephone Encounter (Signed)
Spoke with pt and she states that she has been having LE swelling x 3-4 days.  Right is worse than left.  Denies changes in diet.  Denies SOB or other cardiac sx.  States leg is tight and "shiny" with some slight redness but no heat.  Wore compression stockings yesterday but didn't have them on properly because she can not get them pulled up over her foot.  Swelling does not improve with elevation.  Advised pt I would send message to [name] for review and advisement.

## 2016-09-18 NOTE — Telephone Encounter (Signed)
Pt c/o swelling: STAT is pt has developed SOB within 24 hours  1. How long have you been experiencing swelling-3 orr 4 days 2. Where is the swelling located? feet  3.  Are you currently taking a "fluid pill"?Yes,Furosemide  4.  Are you currently SOB? no  5.  Have you traveled recently?no

## 2016-09-18 NOTE — Telephone Encounter (Signed)
Pt called back to let me know that she forgot when I asked about pain that she has had some pain..  She took an Oxy last night for it.  Did not have any vitals available.  Will update information and send to [name].

## 2016-09-19 NOTE — Telephone Encounter (Signed)
Called patient unable to reach patient left message to give [place] a call back.

## 2016-09-19 NOTE — Telephone Encounter (Signed)
Elevate legs. Take extra 80mg  of lasix for 2 days  Candee Furbish, MD

## 2016-09-19 NOTE — Telephone Encounter (Signed)
lmtcb

## 2016-09-23 NOTE — Telephone Encounter (Signed)
Pt aware to increase Furosemide by 80 mg for 2 days for a total of 160 mg a day.  Advised to elevate feet above the level of her heart as much as possible during and to wear compression stockings during the day. She will call back if no improvement.

## 2016-09-25 ENCOUNTER — Other Ambulatory Visit (INDEPENDENT_AMBULATORY_CARE_PROVIDER_SITE_OTHER): Payer: Medicare Other

## 2016-09-25 DIAGNOSIS — E890 Postprocedural hypothyroidism: Secondary | ICD-10-CM

## 2016-09-25 LAB — TSH: TSH: 1.16 u[IU]/mL (ref 0.35–4.50)

## 2016-09-25 LAB — T4, FREE: Free T4: 0.7 ng/dL (ref 0.60–1.60)

## 2016-09-29 ENCOUNTER — Ambulatory Visit (INDEPENDENT_AMBULATORY_CARE_PROVIDER_SITE_OTHER): Payer: Medicare Other | Admitting: Endocrinology

## 2016-09-29 ENCOUNTER — Encounter: Payer: Self-pay | Admitting: Endocrinology

## 2016-09-29 VITALS — BP 118/64 | HR 77 | Ht 61.5 in | Wt 163.6 lb

## 2016-09-29 DIAGNOSIS — E890 Postprocedural hypothyroidism: Secondary | ICD-10-CM

## 2016-09-29 NOTE — Progress Notes (Signed)
Patient ID: [name], female   DOB: 1945/09/03, 71 y.o.   MRN: [id number]    Reason for Appointment:  Post ablative hypothyroidism, followup  History of Present Illness:    She became hypothyroid in 03/2012 following radioactive iodine treatment for hyperthyroidism in 11/13  She was initially started with 75 mcg of levothyroxine but with her TSH being nearly 50 in 3/14 the dose was increased to 137 mcg Subsequently has required relatively lower doses   Usually she tends to have chronic fatigue regardless of her thyroid level   She had been losing weight in 2016 but now has continued to gain subsequently  She has mild chronic cold intolerance and normally keeps her house warm because of her trigeminal neuralgia   Since her TSH was low normal in 9/17 her dose was reduced to 6-1/2 tablets per week of the 112 g levothyroxine She thinks she is following this instruction although occasionally she may take a 4 tablet on Sundays but she thinks she will then take a half tablet the next day.  She is quite compliant with taking her medication daily, may take  it after breakfast for better tolerability Again she does not complain of any change in her energy level, overall feels fairly good but has had other medical problems.  Takes iron regularly but she does not know whether she takes it in the afternoon or evening  TSH is back to normal consistently    Wt Readings from Last 3 Encounters:  09/29/16 163 lb 9.6 oz (74.2 kg)  07/02/16 169 lb (76.7 kg)  04/29/16 167 lb 6.4 oz (75.9 kg)   Labs:  Lab Results  Component Value Date   TSH 1.16 09/25/2016   TSH 2.85 03/28/2016   TSH 0.30 (L) 12/06/2015   FREET4 0.70 09/25/2016   FREET4 0.76 03/28/2016   FREET4 0.75 12/06/2015       Allergies as of 09/29/2016   No Known Allergies     Medication List       Accurate as of 09/29/16  1:18 PM. Always use your most recent med list.          aspirin EC 81 MG tablet Take 1  tablet (81 mg total) by mouth daily.   carbamazepine 100 MG chewable tablet Commonly known as:  TEGRETOL Chew 100 mg by mouth 2 (two) times daily.   carvedilol 3.125 MG tablet Commonly known as:  COREG TAKE ONE TABLET BY MOUTH TWICE DAILY WITH A MEAL   clorazepate 7.5 MG tablet Commonly known as:  TRANXENE Take 7.5 mg by mouth at bedtime.   cyclobenzaprine 5 MG tablet Commonly known as:  FLEXERIL Take 1 tablet (5 mg total) by mouth 3 (three) times daily as needed for muscle spasms.   diclofenac sodium 1 % Gel Commonly known as:  VOLTAREN Apply 1 application topically 3 (three) times daily. Apply to knees and hands   furosemide 80 MG tablet Commonly known as:  LASIX Take 1 tablet (80 mg total) by mouth daily.   gabapentin 800 MG tablet Commonly known as:  NEURONTIN Take 800 mg by mouth 4 (four) times daily as needed. For pain   iron polysaccharides 150 MG capsule Commonly known as:  NIFEREX Take 150 mg by mouth 2 (two) times daily.   levothyroxine 112 MCG tablet Commonly known as:  SYNTHROID, LEVOTHROID TAKE ONE TABLET BY MOUTH DAILY   mirtazapine 30 MG tablet Commonly known as:  REMERON Take 30 mg by mouth at  bedtime.   nitroGLYCERIN 0.4 MG SL tablet Commonly known as:  NITROSTAT TAKE ONE TABLET UNDER THE TONGUE AS DIRECTED FOR CHEST PAIN   NONFORMULARY OR COMPOUNDED ITEM Apply 1-2 g topically 4 (four) times daily.   Oxycodone HCl 20 MG Tabs Take 1 tablet (20 mg total) by mouth every 6 (six) hours as needed (pain).   potassium chloride SA 20 MEQ tablet Commonly known as:  K-DUR,KLOR-CON TAKE ONE TABLET BY MOUTH DAILY   promethazine 25 MG tablet Commonly known as:  PHENERGAN Take 1 tablet (25 mg total) by mouth every 6 (six) hours as needed. for nasuea   ramipril 2.5 MG capsule Commonly known as:  ALTACE TAKE 1 CAPSULE (2.5 MG TOTAL) BY MOUTH DAILY.   REFRESH TEARS 0.5 % Soln Generic drug:  carboxymethylcellulose Place 1 drop into both eyes daily.     simvastatin 10 MG tablet Commonly known as:  ZOCOR TAKE ONE TABLET BY MOUTH EVERY DAY   spironolactone 25 MG tablet Commonly known as:  ALDACTONE TAKE 1/2 TABLET BY MOUTH EVERY DAY   valACYclovir 500 MG tablet Commonly known as:  VALTREX Take 1 tablet by mouth as directed.   venlafaxine XR 150 MG 24 hr capsule Commonly known as:  EFFEXOR-XR Take 150 mg by mouth daily with breakfast.   zolpidem 10 MG tablet Commonly known as:  AMBIEN Take 10 mg by mouth at bedtime. For sleep           Past Medical History:  Diagnosis Date  . Anemia   . CAD (coronary artery disease)    Prior LAD stenting with stent thrombosis occurring in a withdrawal phase of her Plavix undertaken because of trigeminal neuralgia and release surgery. It was treated medically.  . Cancer ([place])    small bladder cancer removed no  treatment  . Cataract   . CHF (congestive heart failure) ([place])   . Complication of anesthesia    massive heart attack  . GERD (gastroesophageal reflux disease)   . Hyperlipidemia   . Ischemic cardiomyopathy   . Osteoarthritis   . SOB (shortness of breath)     Past Surgical History:  Procedure Laterality Date  . APPENDECTOMY    . BACK SURGERY     x2 back surgeries  . BI-VENTRICULAR IMPLANTABLE CARDIOVERTER DEFIBRILLATOR  (CRT-D)  2009; 05/2013   STJ CRTD generator change by Dr Caryl Comes 05/2013  . BIV PACEMAKER GENERATOR CHANGE OUT Bilateral 05/11/2013   Procedure: BIV PACEMAKER GENERATOR CHANGE OUT;  Surgeon: Deboraha Sprang, MD;  Location: [facility] CATH LAB;  Service: Cardiovascular;  Laterality: Bilateral;  . BRAIN SURGERY     x3  . EYE SURGERY Right 05/24/12  . JOINT REPLACEMENT     thumbs and knees replacement  . TUBAL LIGATION      Family History  Problem Relation Age of Onset  . Heart disease Father     Social History:  reports that she has been smoking Cigarettes.  She has a 15.00 pack-year smoking history. She has never used smokeless tobacco. She reports that she does  not drink alcohol or use drugs.  Allergies: No Known Allergies  Review of Systems:  CARDIOLOGY: she has history of coronary artery disease with multiple events and interventions  History of trigeminal neuralgia   She has not made any efforts to quit smoking recently        Examination:   BP 118/64   Pulse 77   Ht 5' 1.5" (1.562 m)   Wt 163 lb 9.6  oz (74.2 kg)   LMP  (LMP Unknown)   SpO2 93%   BMI 30.41 kg/m   She looks well   Biceps reflexes appear Slowed to relaxation No peripheral edema     Assessment   Post ablative hypothyroidism:   She is currently on a regimen of 112 g, 6-1/2 days a week and has been taking this for quite some time She feels fairly good overall except for recent back pain and other nonspecific recurrent issues She takes her regimen as prescribed most of the time and she thinks she can continue to follow this  Her TSH is normal although just above 1.0 Since she is not complaining of palpitations or having arrhythmias will continue this dose for another 6 months     Maday Guarino 09/29/2016, 1:18 PM

## 2016-10-02 ENCOUNTER — Other Ambulatory Visit (HOSPITAL_COMMUNITY): Payer: Self-pay | Admitting: Psychiatry

## 2016-10-02 DIAGNOSIS — R51 Headache: Secondary | ICD-10-CM | POA: Diagnosis not present

## 2016-10-02 DIAGNOSIS — G894 Chronic pain syndrome: Secondary | ICD-10-CM | POA: Diagnosis not present

## 2016-10-02 DIAGNOSIS — M79606 Pain in leg, unspecified: Secondary | ICD-10-CM | POA: Diagnosis not present

## 2016-10-02 DIAGNOSIS — G500 Trigeminal neuralgia: Secondary | ICD-10-CM | POA: Diagnosis not present

## 2016-10-02 DIAGNOSIS — Z79899 Other long term (current) drug therapy: Secondary | ICD-10-CM | POA: Diagnosis not present

## 2016-10-02 DIAGNOSIS — Z79891 Long term (current) use of opiate analgesic: Secondary | ICD-10-CM | POA: Diagnosis not present

## 2016-10-06 ENCOUNTER — Ambulatory Visit (INDEPENDENT_AMBULATORY_CARE_PROVIDER_SITE_OTHER): Payer: Medicare Other | Admitting: *Deleted

## 2016-10-06 ENCOUNTER — Telehealth: Payer: Self-pay | Admitting: Cardiology

## 2016-10-06 DIAGNOSIS — Z95810 Presence of automatic (implantable) cardiac defibrillator: Secondary | ICD-10-CM | POA: Diagnosis not present

## 2016-10-06 DIAGNOSIS — I255 Ischemic cardiomyopathy: Secondary | ICD-10-CM

## 2016-10-06 DIAGNOSIS — I5022 Chronic systolic (congestive) heart failure: Secondary | ICD-10-CM | POA: Diagnosis not present

## 2016-10-06 NOTE — Telephone Encounter (Signed)
Confirmed remote transmission w/ pt husband.   

## 2016-10-07 ENCOUNTER — Other Ambulatory Visit: Payer: Self-pay | Admitting: Cardiology

## 2016-10-07 ENCOUNTER — Other Ambulatory Visit (HOSPITAL_COMMUNITY): Payer: Self-pay | Admitting: Psychiatry

## 2016-10-07 NOTE — Progress Notes (Signed)
Remote ICD transmission.   

## 2016-10-07 NOTE — Progress Notes (Signed)
EPIC Encounter for ICM Monitoring  Patient Name: [name] is a 71 y.o. female Date: 10/07/2016 Primary Care Physican: Shirline Frees, MD Primary Cardiologist:Skains Electrophysiologist: Faustino Congress Weight:unknown Bi-V Pacing: 93%                                               Heart Failure questions reviewed, pt has leg swelling.     Thoracic impedance normal but was abnormal suggesting fluid accumulation from 09/21/2016 to 10/05/2016.  Prescribed dosage: Furosemide 80 mg 1 tablet daily and Potassium 20 mEq 1 tablet daily  Labs:  01/29/2016 Creatinine 1.19, BUN 19, Potassium 4.5, Sodium 140  Recommendations:  Advised to increase Furosemide 80 mg to 1 tablet twice a day for 2 days only.    Follow-up plan: ICM clinic phone appointment on 11/07/2016.  Office appointment scheduled 10/28/2016 with [name].  Copy of ICM check sent to Dr Marlou Porch and Dr Caryl Comes.   3 month ICM trend: 10/07/2016   1 Year ICM trend:      [name], RN 10/07/2016 1:53 PM

## 2016-10-09 ENCOUNTER — Other Ambulatory Visit (HOSPITAL_COMMUNITY): Payer: Self-pay | Admitting: Psychiatry

## 2016-10-10 ENCOUNTER — Encounter: Payer: Self-pay | Admitting: Cardiology

## 2016-10-14 ENCOUNTER — Encounter: Payer: Self-pay | Admitting: *Deleted

## 2016-10-20 LAB — CUP PACEART REMOTE DEVICE CHECK
Battery Voltage: 2.92 V
Brady Statistic AP VS Percent: 1 %
Brady Statistic AS VP Percent: 83 %
Brady Statistic RA Percent Paced: 7 %
Date Time Interrogation Session: 20180710002333
HighPow Impedance: 40 Ohm
HighPow Impedance: 40 Ohm
Implantable Lead Implant Date: 20090429
Implantable Lead Location: 753858
Implantable Lead Location: 753860
Implantable Pulse Generator Implant Date: 20150211
Lead Channel Impedance Value: 290 Ohm
Lead Channel Impedance Value: 460 Ohm
Lead Channel Pacing Threshold Amplitude: 0.75 V
Lead Channel Pacing Threshold Amplitude: 1.25 V
Lead Channel Pacing Threshold Pulse Width: 0.5 ms
Lead Channel Sensing Intrinsic Amplitude: 11.7 mV
Lead Channel Setting Pacing Amplitude: 2 V
Lead Channel Setting Pacing Pulse Width: 1 ms
Lead Channel Setting Sensing Sensitivity: 0.5 mV
MDC IDC LEAD IMPLANT DT: 20090429
MDC IDC LEAD IMPLANT DT: 20090429
MDC IDC LEAD LOCATION: 753859
MDC IDC MSMT BATTERY REMAINING LONGEVITY: 35 mo
MDC IDC MSMT BATTERY REMAINING PERCENTAGE: 48 %
MDC IDC MSMT LEADCHNL RA PACING THRESHOLD AMPLITUDE: 0.75 V
MDC IDC MSMT LEADCHNL RA PACING THRESHOLD PULSEWIDTH: 0.5 ms
MDC IDC MSMT LEADCHNL RA SENSING INTR AMPL: 1.7 mV
MDC IDC MSMT LEADCHNL RV IMPEDANCE VALUE: 350 Ohm
MDC IDC MSMT LEADCHNL RV PACING THRESHOLD PULSEWIDTH: 1 ms
MDC IDC PG SERIAL: 7170199
MDC IDC SET LEADCHNL LV PACING AMPLITUDE: 1.5 V
MDC IDC SET LEADCHNL LV PACING PULSEWIDTH: 0.5 ms
MDC IDC SET LEADCHNL RV PACING AMPLITUDE: 2.5 V
MDC IDC STAT BRADY AP VP PERCENT: 9.5 %
MDC IDC STAT BRADY AS VS PERCENT: 2.4 %

## 2016-10-28 ENCOUNTER — Other Ambulatory Visit: Payer: Self-pay | Admitting: Endocrinology

## 2016-10-28 ENCOUNTER — Encounter: Payer: Self-pay | Admitting: Cardiology

## 2016-10-28 ENCOUNTER — Ambulatory Visit (INDEPENDENT_AMBULATORY_CARE_PROVIDER_SITE_OTHER): Payer: Medicare Other | Admitting: Cardiology

## 2016-10-28 VITALS — BP 86/52 | HR 80 | Ht 62.0 in | Wt 165.6 lb

## 2016-10-28 DIAGNOSIS — M79606 Pain in leg, unspecified: Secondary | ICD-10-CM | POA: Diagnosis not present

## 2016-10-28 DIAGNOSIS — Z95810 Presence of automatic (implantable) cardiac defibrillator: Secondary | ICD-10-CM

## 2016-10-28 DIAGNOSIS — R51 Headache: Secondary | ICD-10-CM | POA: Diagnosis not present

## 2016-10-28 DIAGNOSIS — Z79899 Other long term (current) drug therapy: Secondary | ICD-10-CM | POA: Diagnosis not present

## 2016-10-28 DIAGNOSIS — R600 Localized edema: Secondary | ICD-10-CM

## 2016-10-28 DIAGNOSIS — G894 Chronic pain syndrome: Secondary | ICD-10-CM | POA: Diagnosis not present

## 2016-10-28 DIAGNOSIS — Z79891 Long term (current) use of opiate analgesic: Secondary | ICD-10-CM | POA: Diagnosis not present

## 2016-10-28 DIAGNOSIS — I5022 Chronic systolic (congestive) heart failure: Secondary | ICD-10-CM

## 2016-10-28 DIAGNOSIS — G500 Trigeminal neuralgia: Secondary | ICD-10-CM

## 2016-10-28 NOTE — Patient Instructions (Signed)
Medication Instructions:  The current medical regimen is effective;  continue present plan and medications.  Testing/Procedures: Your physician has requested that you have a lower extremity venous duplex. This test is an ultrasound of the veins in the legs. It looks at venous blood flow that carries blood from the heart to the legs or arms. Allow one hour for a Lower Venous exam. Allow thirty minutes for an Upper Venous exam. There are no restrictions or special instructions.  Follow-Up: Follow up in 6 months with [name].  You will receive a letter in the mail 2 months before you are due.  Please call us when you receive this letter to schedule your follow up appointment.  Thank you for choosing [facility]!!

## 2016-10-28 NOTE — Progress Notes (Signed)
[facility]. [address], [place], [place]  [address] Phone: [phone] Fax:  [phone]  Date:  10/28/2016   ID:  [name], DOB October 20, 1945, MRN [id number]  PCP:  Shirline Frees, MD   History of Present Illness: [name] is a 71 y.o. female with ischemic cardiomyopathy ejection fraction in the 15-20% range, Ischemic cardiomyopathy status post Bi-V pacemaker, coronary artery disease with bare-metal stent implantation for in-stent restenosis LAD.  She underwent a nuclear stress test  which was high risk, EF 19%, large area of infarct.  She has had 2 episodes of chest discomfort that was quite severe and took 2 nitroglycerin. This subsided. She's no longer having any discomfort.  Dr. Caryl Comes  provided her with AV optimization of her device. She was told that her device at baseline was fairly synchronized.  She is battled with long-standing depression.    In 2012, we had a long discussion about her cardiomyopathy and overall prognosis.   10/28/16  - Blood pressure is quite low again today. In the 80s. She has had this in the past. She's not feeling any chest pain. No significant shortness of breath. Over the last week or 2 she has noticed some increased swelling in her left lower extremity. She states that this is unusual for her. No pain.    Wt Readings from Last 3 Encounters:  10/28/16 165 lb 9.6 oz (75.1 kg)  09/29/16 163 lb 9.6 oz (74.2 kg)  07/02/16 169 lb (76.7 kg)     Past Medical History:  Diagnosis Date  . Anemia   . CAD (coronary artery disease)    Prior LAD stenting with stent thrombosis occurring in a withdrawal phase of her Plavix undertaken because of trigeminal neuralgia and release surgery. It was treated medically.  . Cancer ([place])    small bladder cancer removed no  treatment  . Cataract   . CHF (congestive heart failure) ([place])   . Complication of anesthesia    massive heart attack  . GERD (gastroesophageal reflux disease)   .  Hyperlipidemia   . Ischemic cardiomyopathy   . Osteoarthritis   . SOB (shortness of breath)     Past Surgical History:  Procedure Laterality Date  . APPENDECTOMY    . BACK SURGERY     x2 back surgeries  . BI-VENTRICULAR IMPLANTABLE CARDIOVERTER DEFIBRILLATOR  (CRT-D)  2009; 05/2013   STJ CRTD generator change by Dr Caryl Comes 05/2013  . BIV PACEMAKER GENERATOR CHANGE OUT Bilateral 05/11/2013   Procedure: BIV PACEMAKER GENERATOR CHANGE OUT;  Surgeon: Deboraha Sprang, MD;  Location: [facility] CATH LAB;  Service: Cardiovascular;  Laterality: Bilateral;  . BRAIN SURGERY     x3  . EYE SURGERY Right 05/24/12  . JOINT REPLACEMENT     thumbs and knees replacement  . TUBAL LIGATION      Current Outpatient Prescriptions  Medication Sig Dispense Refill  . aspirin EC 81 MG tablet Take 1 tablet (81 mg total) by mouth daily.    . carbamazepine (TEGRETOL) 100 MG chewable tablet Chew 100 mg by mouth 2 (two) times daily.   2  . carboxymethylcellulose (REFRESH TEARS) 0.5 % SOLN Place 1 drop into both eyes daily.     . carvedilol (COREG) 3.125 MG tablet TAKE ONE TABLET BY MOUTH TWICE DAILY WITH A MEAL 60 tablet 9  . clorazepate (TRANXENE) 7.5 MG tablet Take 7.5 mg by mouth at bedtime.   4  . cyclobenzaprine (FLEXERIL) 5 MG  tablet Take 1 tablet (5 mg total) by mouth 3 (three) times daily as needed for muscle spasms. 30 tablet 0  . furosemide (LASIX) 80 MG tablet Take 1 tablet (80 mg total) by mouth daily. 30 tablet 4  . gabapentin (NEURONTIN) 800 MG tablet Take 800 mg by mouth 4 (four) times daily as needed. For pain    . iron polysaccharides (NIFEREX) 150 MG capsule Take 150 mg by mouth 2 (two) times daily.    Marland Kitchen levothyroxine (SYNTHROID, LEVOTHROID) 112 MCG tablet TAKE ONE TABLET BY MOUTH DAILY 30 tablet 3  . mirtazapine (REMERON) 30 MG tablet Take 30 mg by mouth at bedtime.  5  . nitroGLYCERIN (NITROSTAT) 0.4 MG SL tablet TAKE ONE TABLET UNDER THE TONGUE AS DIRECTED FOR CHEST PAIN 25 tablet 1  . NONFORMULARY OR  COMPOUNDED ITEM Apply 1-2 g topically 4 (four) times daily. 120 each 2  . Oxycodone HCl 10 MG TABS Take 1 tablet by mouth every 6 (six) hours as needed for pain.  0  . potassium chloride SA (K-DUR,KLOR-CON) 20 MEQ tablet TAKE ONE TABLET BY MOUTH DAILY 30 tablet 8  . promethazine (PHENERGAN) 25 MG tablet Take 1 tablet (25 mg total) by mouth every 6 (six) hours as needed. for nasuea 90 tablet 1  . ramipril (ALTACE) 2.5 MG capsule TAKE 1 CAPSULE (2.5 MG TOTAL) BY MOUTH DAILY. 90 capsule 2  . simvastatin (ZOCOR) 10 MG tablet TAKE ONE TABLET BY MOUTH EVERY DAY 90 tablet 3  . spironolactone (ALDACTONE) 25 MG tablet TAKE 1/2 TABLET BY MOUTH EVERY DAY 45 tablet 3  . valACYclovir (VALTREX) 500 MG tablet Take 1 tablet by mouth as directed.    . venlafaxine XR (EFFEXOR-XR) 150 MG 24 hr capsule Take 150 mg by mouth daily with breakfast.   4  . VENTOLIN HFA 108 (90 Base) MCG/ACT inhaler Inhale 2 puffs into the lungs as needed.  4  . zolpidem (AMBIEN) 10 MG tablet Take 10 mg by mouth at bedtime. For sleep     Current Facility-Administered Medications  Medication Dose Route Frequency Provider Last Rate Last Dose  . betamethasone acetate-betamethasone sodium phosphate (CELESTONE) injection 3 mg  3 mg Intramuscular Once Edrick Kins, DPM        Allergies:   No Known Allergies  Social History:  The patient  reports that she has been smoking Cigarettes.  She has a 15.00 pack-year smoking history. She has never used smokeless tobacco. She reports that she does not drink alcohol or use drugs.   ROS:  Please see the history of present illness.   Positive for depression, cardiomyopathy. No syncope, no fevers, no chest pain, no significant shortness of breath    PHYSICAL EXAM: VS:  BP (!) 86/52 (BP Location: Left Arm, Patient Position: Sitting, Cuff Size: Normal)   Pulse 80   Ht 5\' 2"  (1.575 m)   Wt 165 lb 9.6 oz (75.1 kg)   LMP  (LMP Unknown)   SpO2 93%   BMI 30.29 kg/m  GEN: Thin in no acute distress    HEENT: normal  Neck: no JVD, carotid bruits, or masses Cardiac: RRR; no murmurs, rubs, or gallops, LLE non tender, 3+ edema  Respiratory:  clear to auscultation bilaterally, normal work of breathing GI: soft, nontender, nondistended, + BS MS: no deformity or atrophy  Skin: warm and dry, no rash Neuro:  Alert and Oriented x 3, Strength and sensation are intact, sleepy Psych: euthymic mood, full affect   EKG:  EKG performed today 04/29/16-ventricular pacing, biventricular pacing. Heart rate 74 bpm. 04/26/14 - Ventricular pacing, sinus rhythm, 87.     NUC stress 04/19/15:  Nuclear stress EF: 19%.  This is a high risk study.  Findings consistent with prior myocardial infarction.  The left ventricular ejection fraction is severely decreased (<30%).  High risk stress nuclear study with large, severe, fixed anterior, septal, apical and distal inferior defect consistent with prior infarct; no ischemia; EF 19 with akinesis of the anteroseptal wall and apex; severe LVE; study high risk due to reduced LV function.  ASSESSMENT AND PLAN:  Chronic systolic heart failure-currently well compensated. Ejection fraction of 19% range.  NYHA class II symptoms. BP previously in the 80s and we see this once again. Still warn her about risks of driving because of her medications and risks to others on the road. She would not be a candidate for Entresto based upon BP. I requested that she talk to her neurologist about decreasing her gabapentin which she is taking 800 mg 4 times a day. She is quite sleepy. She states that she is not having any trigeminal neuralgia pain.  Biventricular ICD-fairly well synchronized according to review of Dr. Olin Pia note. Stable, no changes.  Left lower extremity edema-she states that this is not common for her to have it in her left lower extremity. She's not having any ultimate pain with her leg but the swelling is quite increased over the past several days. I will check a  lower extremity venous Doppler left leg to exclude the possibility of DVT.  Hypotension-I wonder if gabapentin is playing a significant role, she is also on Tegretol. She will call the neurology office to see if she can cut back. I advised her against driving. She understands the consequences of potential crash.  Tobacco use-encouraged cessation. Albuterol. For the first time today she said she was actually think about quitting.    6 month follow up or sooner.  Signed, Candee Furbish, MD [facility]  10/28/2016 4:48 PM

## 2016-10-29 ENCOUNTER — Telehealth (HOSPITAL_COMMUNITY): Payer: Self-pay | Admitting: Cardiology

## 2016-11-01 ENCOUNTER — Other Ambulatory Visit (HOSPITAL_COMMUNITY): Payer: Self-pay | Admitting: Psychiatry

## 2016-11-04 ENCOUNTER — Other Ambulatory Visit (HOSPITAL_COMMUNITY): Payer: Self-pay | Admitting: Psychiatry

## 2016-11-07 ENCOUNTER — Telehealth: Payer: Self-pay

## 2016-11-07 ENCOUNTER — Ambulatory Visit (INDEPENDENT_AMBULATORY_CARE_PROVIDER_SITE_OTHER): Payer: Medicare Other

## 2016-11-07 DIAGNOSIS — R600 Localized edema: Secondary | ICD-10-CM

## 2016-11-07 DIAGNOSIS — Z95810 Presence of automatic (implantable) cardiac defibrillator: Secondary | ICD-10-CM

## 2016-11-07 DIAGNOSIS — I5022 Chronic systolic (congestive) heart failure: Secondary | ICD-10-CM

## 2016-11-07 NOTE — Telephone Encounter (Signed)
LMOVM requesting that pt send manual transmission 

## 2016-11-07 NOTE — Progress Notes (Signed)
EPIC Encounter for ICM Monitoring  Patient Name: [name] is a 71 y.o. female Date: 11/07/2016 Primary Care Physican: Shirline Frees, MD Primary Cardiologist:Skains Electrophysiologist: Faustino Congress Weight:unknown Bi-V Pacing: 93%       Heart Failure questions reviewed, pt symptomatic with swelling in left leg only which is the same as when she saw Dr Marlou Porch on 10/28/2016.  She did not have the Doppler to her left leg as instructed by Dr Marlou Porch at office visit.    Thoracic impedance abnormal suggesting fluid accumulation since 10/25/2016.  Prescribed dosage: Furosemide 80 mg 1 tablet daily and Potassium 20 mEq 1 tablet daily  Labs:  01/29/2016 Creatinine 1.19, BUN 19, Potassium 4.5, Sodium 140  Recommendations:  Advised to limit salt intake to 2000 mg/day and fluid intake to < 2 liters/day.  She stated she eats out a lot and explained most restaurant foods have 3000-4000 mg of salt in a meal.  Advised to try and cut back on eating out.  Encouraged to call for fluid symptoms.  Follow-up plan: ICM clinic phone appointment on 11/13/2016 to recheck fluid levels.    Copy of ICM check sent to Dr Marlou Porch and Dr Caryl Comes fore view and if any recommendations will call her back.   3 month ICM trend: 11/07/2016   1 Year ICM trend:      [name], RN 11/07/2016 3:19 PM

## 2016-11-11 DIAGNOSIS — G44091 Other trigeminal autonomic cephalgias (TAC), intractable: Secondary | ICD-10-CM | POA: Diagnosis not present

## 2016-11-13 ENCOUNTER — Ambulatory Visit (INDEPENDENT_AMBULATORY_CARE_PROVIDER_SITE_OTHER): Payer: Self-pay

## 2016-11-13 ENCOUNTER — Telehealth: Payer: Self-pay

## 2016-11-13 DIAGNOSIS — Z95810 Presence of automatic (implantable) cardiac defibrillator: Secondary | ICD-10-CM

## 2016-11-13 DIAGNOSIS — I5022 Chronic systolic (congestive) heart failure: Secondary | ICD-10-CM

## 2016-11-13 NOTE — Progress Notes (Signed)
EPIC Encounter for ICM Monitoring  Patient Name: [name] is a 71 y.o. female Date: 11/13/2016 Primary Care Physican: Shirline Frees, MD Primary Cardiologist:Skains Electrophysiologist: Faustino Congress Weight:unknown Bi-V Pacing: 94%          Heart Failure questions reviewed, pt said ankle swelling has resolved.    Thoracic impedance returned to normal since last ICM transmission on 11/07/2016.   Prescribed dosage: Furosemide 80 mg 1 tablet daily and Potassium 20 mEq 1 tablet daily  Labs:  01/29/2016 Creatinine 1.19, BUN 19, Potassium 4.5, Sodium 140  Recommendations: No changes.  Encouraged to call for fluid symptoms.  Follow-up plan: ICM clinic phone appointment on 12/08/2016.    Copy of ICM check sent to device physician.   3 month ICM trend: 11/13/2016   1 Year ICM trend:      [name], RN 11/13/2016 4:07 PM

## 2016-11-13 NOTE — Telephone Encounter (Signed)
Spoke with pt and reminded pt of remote transmission that is due today. Pt verbalized understanding.   

## 2016-11-14 ENCOUNTER — Telehealth: Payer: Self-pay

## 2016-11-14 ENCOUNTER — Telehealth: Payer: Self-pay | Admitting: Cardiology

## 2016-11-14 NOTE — Telephone Encounter (Signed)
Patient called to report she has swelling in one foot with that is slightly warm to touch. She reported it looks a little gray in color but denied any pain.  She was unable to give a direct answer if the swelling is different than when she visited Dr Marlou Porch on 10/28/2016.  She said since she had some fluid over the last week per device report, then would the swelling be something she should be very concerned over.  She did not get the doppler test that Dr Marlou Porch ordered on 10/28/2016 because did not think it was needed.  She is asking what she should do today and would the current symptoms be an urgent matter.  Advised to go to ER to have it evaluated.  Explained I am not able to determine by phone if it is urgent.  Patient was frustrated that I could not give a firm answer if she should be concerned about the swelling.  Explained when I spoke with her on 8/10 she had some fluid accumulation and on 8/16 she was back at baseline but she did report a little foot swelling but changes in color or temperature.   Advised to use ER over the weekend if needed.  Encouraged her to call PCP or Dr Marlou Porch nurse to set up the doppler that he wanted her to have on 10/28/2016.  She said the swelling comes and goes and is not a new symptom.  She said she will use the ER if needed.

## 2016-11-19 NOTE — Telephone Encounter (Signed)
Closed encounter °

## 2016-11-25 DIAGNOSIS — Z79891 Long term (current) use of opiate analgesic: Secondary | ICD-10-CM | POA: Diagnosis not present

## 2016-11-25 DIAGNOSIS — G894 Chronic pain syndrome: Secondary | ICD-10-CM | POA: Diagnosis not present

## 2016-11-25 DIAGNOSIS — M79606 Pain in leg, unspecified: Secondary | ICD-10-CM | POA: Diagnosis not present

## 2016-11-25 DIAGNOSIS — G500 Trigeminal neuralgia: Secondary | ICD-10-CM | POA: Diagnosis not present

## 2016-11-25 DIAGNOSIS — F339 Major depressive disorder, recurrent, unspecified: Secondary | ICD-10-CM | POA: Diagnosis not present

## 2016-11-25 DIAGNOSIS — R51 Headache: Secondary | ICD-10-CM | POA: Diagnosis not present

## 2016-11-25 DIAGNOSIS — Z79899 Other long term (current) drug therapy: Secondary | ICD-10-CM | POA: Diagnosis not present

## 2016-12-02 ENCOUNTER — Other Ambulatory Visit: Payer: Self-pay | Admitting: Cardiology

## 2016-12-08 ENCOUNTER — Ambulatory Visit (INDEPENDENT_AMBULATORY_CARE_PROVIDER_SITE_OTHER): Payer: Medicare Other

## 2016-12-08 DIAGNOSIS — Z95810 Presence of automatic (implantable) cardiac defibrillator: Secondary | ICD-10-CM | POA: Diagnosis not present

## 2016-12-08 DIAGNOSIS — I5022 Chronic systolic (congestive) heart failure: Secondary | ICD-10-CM | POA: Diagnosis not present

## 2016-12-08 NOTE — Progress Notes (Signed)
EPIC Encounter for ICM Monitoring  Patient Name: [name] is a 71 y.o. female Date: 12/08/2016 Primary Care Physican: Shirline Frees, MD Primary Cardiologist:Skains Electrophysiologist: Faustino Congress Weight:unknown Bi-V Pacing: 94%      Heart Failure questions reviewed, pt chronic swelling of ankles and feet, no worse than last month.   Thoracic impedance has been abnormal suggesting fluid accumulation from 11/30/2016 to 12/07/2016 and almost back at baseline today.  Prescribed dosage: Furosemide 80 mg 1 tablet daily and Potassium 20 mEq 1 tablet daily  Labs:  01/29/2016 Creatinine 1.19, BUN 19, Potassium 4.5, Sodium 140  Recommendations: No changes.  Advised to limit salt intake to 2000 mg/day and fluid intake to < 2 liters/day.  Encouraged to call for fluid symptoms.  Follow-up plan: ICM clinic phone appointment on 01/08/2017.    Copy of ICM check sent to Dr. Caryl Comes.   3 month ICM trend: 12/08/2016       1 Year ICM trend:      [name], RN 12/08/2016 9:38 AM

## 2016-12-09 DIAGNOSIS — R51 Headache: Secondary | ICD-10-CM | POA: Diagnosis not present

## 2016-12-09 DIAGNOSIS — M79606 Pain in leg, unspecified: Secondary | ICD-10-CM | POA: Diagnosis not present

## 2016-12-09 DIAGNOSIS — G894 Chronic pain syndrome: Secondary | ICD-10-CM | POA: Diagnosis not present

## 2016-12-09 DIAGNOSIS — G500 Trigeminal neuralgia: Secondary | ICD-10-CM | POA: Diagnosis not present

## 2016-12-23 ENCOUNTER — Other Ambulatory Visit (HOSPITAL_COMMUNITY): Payer: Self-pay | Admitting: Psychiatry

## 2016-12-23 ENCOUNTER — Other Ambulatory Visit: Payer: Self-pay | Admitting: Physician Assistant

## 2016-12-23 DIAGNOSIS — Z720 Tobacco use: Secondary | ICD-10-CM

## 2016-12-23 DIAGNOSIS — I255 Ischemic cardiomyopathy: Secondary | ICD-10-CM

## 2016-12-23 DIAGNOSIS — I25119 Atherosclerotic heart disease of native coronary artery with unspecified angina pectoris: Secondary | ICD-10-CM

## 2016-12-23 DIAGNOSIS — I10 Essential (primary) hypertension: Secondary | ICD-10-CM

## 2016-12-23 DIAGNOSIS — Z95810 Presence of automatic (implantable) cardiac defibrillator: Secondary | ICD-10-CM

## 2016-12-23 DIAGNOSIS — I5022 Chronic systolic (congestive) heart failure: Secondary | ICD-10-CM

## 2016-12-23 DIAGNOSIS — E785 Hyperlipidemia, unspecified: Secondary | ICD-10-CM

## 2016-12-24 DIAGNOSIS — G44091 Other trigeminal autonomic cephalgias (TAC), intractable: Secondary | ICD-10-CM | POA: Diagnosis not present

## 2016-12-25 ENCOUNTER — Other Ambulatory Visit (HOSPITAL_COMMUNITY): Payer: Self-pay | Admitting: Psychiatry

## 2017-01-01 DIAGNOSIS — Z79899 Other long term (current) drug therapy: Secondary | ICD-10-CM | POA: Diagnosis not present

## 2017-01-01 DIAGNOSIS — Z79891 Long term (current) use of opiate analgesic: Secondary | ICD-10-CM | POA: Diagnosis not present

## 2017-01-01 DIAGNOSIS — R51 Headache: Secondary | ICD-10-CM | POA: Diagnosis not present

## 2017-01-01 DIAGNOSIS — G500 Trigeminal neuralgia: Secondary | ICD-10-CM | POA: Diagnosis not present

## 2017-01-01 DIAGNOSIS — G894 Chronic pain syndrome: Secondary | ICD-10-CM | POA: Diagnosis not present

## 2017-01-01 DIAGNOSIS — M79606 Pain in leg, unspecified: Secondary | ICD-10-CM | POA: Diagnosis not present

## 2017-01-08 ENCOUNTER — Ambulatory Visit (INDEPENDENT_AMBULATORY_CARE_PROVIDER_SITE_OTHER): Payer: Medicare Other | Admitting: *Deleted

## 2017-01-08 DIAGNOSIS — I5022 Chronic systolic (congestive) heart failure: Secondary | ICD-10-CM

## 2017-01-08 DIAGNOSIS — Z95810 Presence of automatic (implantable) cardiac defibrillator: Secondary | ICD-10-CM

## 2017-01-08 DIAGNOSIS — I255 Ischemic cardiomyopathy: Secondary | ICD-10-CM | POA: Diagnosis not present

## 2017-01-08 NOTE — Progress Notes (Signed)
Remote ICD transmission.   

## 2017-01-09 ENCOUNTER — Encounter: Payer: Self-pay | Admitting: Cardiology

## 2017-01-09 NOTE — Progress Notes (Signed)
EPIC Encounter for ICM Monitoring  Patient Name: [name] is a 71 y.o. female Date: 01/09/2017 Primary Care Physican: Shirline Frees, MD Primary Cardiologist:Skains Electrophysiologist: Faustino Congress Weight:unknown Bi-V Pacing: 94%       Heart Failure questions reviewed, pt asymptomatic.   Thoracic impedance normal.  Prescribed dosage: Furosemide 80 mg 1 tablet daily and Potassium 20 mEq 1 tablet daily  Labs:  01/29/2016 Creatinine 1.19, BUN 19, Potassium 4.5, Sodium 140  Recommendations: No changes.  Encouraged to call for fluid symptoms.  Follow-up plan: ICM clinic phone appointment on 02/09/2017.    Copy of ICM check sent to Dr. Caryl Comes.   3 month ICM trend: 01/09/2017   1 Year ICM trend:      [name], RN 01/09/2017 11:48 AM

## 2017-01-15 ENCOUNTER — Other Ambulatory Visit (HOSPITAL_COMMUNITY): Payer: Self-pay | Admitting: Psychiatry

## 2017-01-15 ENCOUNTER — Other Ambulatory Visit: Payer: Self-pay | Admitting: Cardiology

## 2017-01-20 ENCOUNTER — Other Ambulatory Visit (HOSPITAL_COMMUNITY): Payer: Self-pay | Admitting: Psychiatry

## 2017-01-21 DIAGNOSIS — F339 Major depressive disorder, recurrent, unspecified: Secondary | ICD-10-CM | POA: Diagnosis not present

## 2017-01-27 DIAGNOSIS — M79674 Pain in right toe(s): Secondary | ICD-10-CM | POA: Diagnosis not present

## 2017-01-27 DIAGNOSIS — Z23 Encounter for immunization: Secondary | ICD-10-CM | POA: Diagnosis not present

## 2017-01-27 LAB — CUP PACEART REMOTE DEVICE CHECK
Battery Voltage: 2.92 V
Brady Statistic AP VP Percent: 9.2 %
Brady Statistic AP VS Percent: 1 %
Brady Statistic AS VP Percent: 85 %
Brady Statistic AS VS Percent: 2.3 %
Brady Statistic RA Percent Paced: 7.3 %
Date Time Interrogation Session: 20181011080019
HighPow Impedance: 39 Ohm
HighPow Impedance: 40 Ohm
Implantable Lead Implant Date: 20090429
Implantable Lead Location: 753858
Implantable Lead Location: 753859
Implantable Lead Location: 753860
Implantable Lead Model: 7120
Implantable Pulse Generator Implant Date: 20150211
Lead Channel Impedance Value: 490 Ohm
Lead Channel Pacing Threshold Amplitude: 0.75 V
Lead Channel Pacing Threshold Amplitude: 1.25 V
Lead Channel Pacing Threshold Pulse Width: 0.5 ms
Lead Channel Pacing Threshold Pulse Width: 1 ms
Lead Channel Sensing Intrinsic Amplitude: 1.8 mV
Lead Channel Sensing Intrinsic Amplitude: 12 mV
Lead Channel Setting Pacing Amplitude: 1.5 V
Lead Channel Setting Pacing Amplitude: 2.5 V
Lead Channel Setting Sensing Sensitivity: 0.5 mV
MDC IDC LEAD IMPLANT DT: 20090429
MDC IDC LEAD IMPLANT DT: 20090429
MDC IDC MSMT BATTERY REMAINING LONGEVITY: 32 mo
MDC IDC MSMT BATTERY REMAINING PERCENTAGE: 45 %
MDC IDC MSMT LEADCHNL LV PACING THRESHOLD PULSEWIDTH: 0.5 ms
MDC IDC MSMT LEADCHNL RA IMPEDANCE VALUE: 290 Ohm
MDC IDC MSMT LEADCHNL RA PACING THRESHOLD AMPLITUDE: 0.75 V
MDC IDC MSMT LEADCHNL RV IMPEDANCE VALUE: 360 Ohm
MDC IDC SET LEADCHNL LV PACING PULSEWIDTH: 0.5 ms
MDC IDC SET LEADCHNL RA PACING AMPLITUDE: 2 V
MDC IDC SET LEADCHNL RV PACING PULSEWIDTH: 1 ms
Pulse Gen Serial Number: 7170199

## 2017-02-03 ENCOUNTER — Other Ambulatory Visit (HOSPITAL_COMMUNITY): Payer: Self-pay | Admitting: Psychiatry

## 2017-02-05 DIAGNOSIS — G894 Chronic pain syndrome: Secondary | ICD-10-CM | POA: Diagnosis not present

## 2017-02-05 DIAGNOSIS — G500 Trigeminal neuralgia: Secondary | ICD-10-CM | POA: Diagnosis not present

## 2017-02-05 DIAGNOSIS — R51 Headache: Secondary | ICD-10-CM | POA: Diagnosis not present

## 2017-02-05 DIAGNOSIS — M79606 Pain in leg, unspecified: Secondary | ICD-10-CM | POA: Diagnosis not present

## 2017-02-09 ENCOUNTER — Telehealth: Payer: Self-pay | Admitting: Cardiology

## 2017-02-09 ENCOUNTER — Ambulatory Visit (INDEPENDENT_AMBULATORY_CARE_PROVIDER_SITE_OTHER): Payer: Medicare Other

## 2017-02-09 DIAGNOSIS — Z95810 Presence of automatic (implantable) cardiac defibrillator: Secondary | ICD-10-CM

## 2017-02-09 DIAGNOSIS — I5022 Chronic systolic (congestive) heart failure: Secondary | ICD-10-CM

## 2017-02-09 NOTE — Telephone Encounter (Signed)
Confirmed remote transmission w/ pt caregiver.   

## 2017-02-10 ENCOUNTER — Other Ambulatory Visit (HOSPITAL_COMMUNITY): Payer: Self-pay | Admitting: Psychiatry

## 2017-02-10 NOTE — Progress Notes (Signed)
EPIC Encounter for ICM Monitoring  Patient Name: [name] is a 71 y.o. female Date: 02/10/2017 Primary Care Physican: Shirline Frees, MD Primary Cardiologist:Skains Electrophysiologist: Caryl Comes Dry Weight:165 lb at last office visit Bi-V Pacing: 94%       Heart Failure questions reviewed, pt asymptomatic.   Thoracic impedance normal but was abnormal suggesting fluid accumulation 01/15/2017 to 01/22/2017.  Prescribed dosage: Furosemide 80 mg 1 tablet daily and Potassium 20 mEq 1 tablet daily  Labs:  01/29/2016 Creatinine 1.19, BUN 19, Potassium 4.5, Sodium 140  Recommendations: No changes.   Encouraged to call for fluid symptoms.  Follow-up plan: ICM clinic phone appointment on 03/12/2017.    Copy of ICM check sent to Dr. Caryl Comes.   3 month ICM trend: 02/10/2017    1 Year ICM trend:       [name], RN 02/10/2017 11:54 AM

## 2017-02-14 ENCOUNTER — Other Ambulatory Visit: Payer: Self-pay | Admitting: Endocrinology

## 2017-02-14 ENCOUNTER — Other Ambulatory Visit: Payer: Self-pay | Admitting: Cardiology

## 2017-02-23 DIAGNOSIS — F339 Major depressive disorder, recurrent, unspecified: Secondary | ICD-10-CM | POA: Diagnosis not present

## 2017-03-12 ENCOUNTER — Telehealth: Payer: Self-pay | Admitting: Cardiology

## 2017-03-12 ENCOUNTER — Ambulatory Visit (INDEPENDENT_AMBULATORY_CARE_PROVIDER_SITE_OTHER): Payer: Medicare Other

## 2017-03-12 DIAGNOSIS — I5022 Chronic systolic (congestive) heart failure: Secondary | ICD-10-CM | POA: Diagnosis not present

## 2017-03-12 DIAGNOSIS — Z95810 Presence of automatic (implantable) cardiac defibrillator: Secondary | ICD-10-CM

## 2017-03-12 NOTE — Telephone Encounter (Signed)
Spoke with pt and reminded pt of remote transmission that is due today. Pt verbalized understanding.   

## 2017-03-19 ENCOUNTER — Telehealth: Payer: Self-pay

## 2017-03-19 NOTE — Progress Notes (Signed)
EPIC Encounter for ICM Monitoring  Patient Name: [name] is a 71 y.o. female Date: 03/19/2017 Primary Care Physican: Shirline Frees, MD Primary Cardiologist:Skains Electrophysiologist: Caryl Comes Dry Weight:165 lb at last office visit Bi-V Pacing: 94%        Attempted call to patient and unable to reach.   Transmission reviewed.    Thoracic impedance abnormal suggesting fluid accumulation since 03/18/2017.  Prescribed dosage: Furosemide 80 mg 1 tablet daily and Potassium 20 mEq 1 tablet daily  Labs:  01/29/2016 Creatinine 1.19, BUN 19, Potassium 4.5, Sodium 140  Recommendations:  Reviewed with Dr Caryl Comes in the office.  He recommended to increase Furosemide to 80 mg AM and 40 mg PM x 3 days and return to prescribed dosage after 3 days, if patient is reached.   Follow-up plan: ICM clinic phone appointment on 03/26/2017 to recheck fluid levels.    Copy of ICM check sent to Dr. Caryl Comes and [name].   3 month ICM trend: 03/19/2017    1 Year ICM trend:       [name], RN 03/19/2017 11:39 AM

## 2017-03-19 NOTE — Telephone Encounter (Signed)
Remote ICM transmission received.  Attempted call to patient and line busy x 2.   

## 2017-03-20 ENCOUNTER — Telehealth: Payer: Self-pay | Admitting: Cardiology

## 2017-03-20 NOTE — Telephone Encounter (Signed)
Noted  

## 2017-03-20 NOTE — Telephone Encounter (Signed)
New Message    Patient wants to notify Dr. Caryl Comes that her lights was turned off yesterday and can not check monitoring.   (Dot phrase not loaded)

## 2017-03-26 NOTE — Progress Notes (Signed)
EPIC Encounter for ICM Monitoring  Patient Name: [name] is a 71 y.o. female Date: 03/26/2017 Primary Care Physican: Shirline Frees, MD Primary Cardiologist:Skains Electrophysiologist: Caryl Comes Dry Weight:165 lb at last office visit (does not weigh at home) Bi-V Pacing: 94%        Heart Failure questions reviewed, pt symptomatic with leg swelling.   Thoracic impedance abnormal suggesting fluid accumulation since 03/19/2017.  Prescribed dosage: Furosemide 80 mg 1 tablet daily and Potassium 20 mEq 1 tablet daily.  Reviewed with Dr Caryl Comes in the office on 03/19/2017 and he recommended to increase Furosemide to 80 mg AM and 40 mg PM x 3 days and return to prescribed dosage after 3 days, if patient is reached.  Labs:  01/29/2016 Creatinine 1.19, BUN 19, Potassium 4.5, Sodium 140  Recommendations:  Advised to follow Dr Olin Pia recommendation to take Furosemide 80 mg AM and 40 mg PM x 3 days and then return to prescribed dosage of 80 mg after 3rd day.  She verbalized understanding.   Follow-up plan: ICM clinic phone appointment on 04/13/2017 to recheck fluid levels.    Copy of ICM check sent to Dr. Caryl Comes.   3 month ICM trend: 03/26/2017    1 Year ICM trend:       [name], RN 03/26/2017 5:00 PM

## 2017-03-27 ENCOUNTER — Ambulatory Visit (INDEPENDENT_AMBULATORY_CARE_PROVIDER_SITE_OTHER): Payer: Self-pay

## 2017-03-27 DIAGNOSIS — I5022 Chronic systolic (congestive) heart failure: Secondary | ICD-10-CM

## 2017-03-27 DIAGNOSIS — Z95810 Presence of automatic (implantable) cardiac defibrillator: Secondary | ICD-10-CM

## 2017-03-30 DIAGNOSIS — G500 Trigeminal neuralgia: Secondary | ICD-10-CM | POA: Diagnosis not present

## 2017-04-01 ENCOUNTER — Ambulatory Visit: Payer: Medicare Other | Admitting: Endocrinology

## 2017-04-01 DIAGNOSIS — Z0289 Encounter for other administrative examinations: Secondary | ICD-10-CM

## 2017-04-13 ENCOUNTER — Ambulatory Visit (INDEPENDENT_AMBULATORY_CARE_PROVIDER_SITE_OTHER): Payer: Medicare Other | Admitting: *Deleted

## 2017-04-13 DIAGNOSIS — I5022 Chronic systolic (congestive) heart failure: Secondary | ICD-10-CM | POA: Diagnosis not present

## 2017-04-13 DIAGNOSIS — Z95810 Presence of automatic (implantable) cardiac defibrillator: Secondary | ICD-10-CM

## 2017-04-13 DIAGNOSIS — I255 Ischemic cardiomyopathy: Secondary | ICD-10-CM

## 2017-04-13 NOTE — Progress Notes (Signed)
EPIC Encounter for ICM Monitoring  Patient Name: [name] is a 72 y.o. female Date: 04/13/2017 Primary Care Physican: Shirline Frees, MD Primary Cardiologist:Skains Electrophysiologist: Caryl Comes Dry Weight:164 lb  Bi-V Pacing: 94%      Heart Failure questions reviewed, pt asymptomatic.   Thoracic impedance normal but was abnormal suggesting fluid accumulation 03/19/2017 to 04/10/2017 (21 days).     Prescribed dosage:  Furosemide 80 mg 1 tablet daily    Recommendations: No changes.   Encouraged to call for fluid symptoms.  Follow-up plan: ICM clinic phone appointment on 05/14/2017.    Copy of ICM check sent to Dr. Caryl Comes.   3 month ICM trend: 04/13/2017    1 Year ICM trend:       [name], RN 04/13/2017 5:09 PM

## 2017-04-14 NOTE — Progress Notes (Signed)
Remote ICD transmission.   

## 2017-04-15 LAB — CUP PACEART REMOTE DEVICE CHECK
Battery Remaining Longevity: 30 mo
Battery Remaining Percentage: 40 %
Battery Voltage: 2.9 V
Brady Statistic AP VP Percent: 9.3 %
Brady Statistic AS VP Percent: 84 %
Brady Statistic RA Percent Paced: 7.4 %
Date Time Interrogation Session: 20190114090017
HIGH POWER IMPEDANCE MEASURED VALUE: 38 Ohm
HighPow Impedance: 38 Ohm
Implantable Lead Location: 753859
Lead Channel Impedance Value: 360 Ohm
Lead Channel Pacing Threshold Amplitude: 0.75 V
Lead Channel Pacing Threshold Amplitude: 0.75 V
Lead Channel Pacing Threshold Amplitude: 1.25 V
Lead Channel Pacing Threshold Pulse Width: 0.5 ms
Lead Channel Pacing Threshold Pulse Width: 1 ms
Lead Channel Setting Pacing Amplitude: 2 V
Lead Channel Setting Pacing Amplitude: 2.5 V
Lead Channel Setting Pacing Pulse Width: 0.5 ms
Lead Channel Setting Sensing Sensitivity: 0.5 mV
MDC IDC LEAD IMPLANT DT: 20090429
MDC IDC LEAD IMPLANT DT: 20090429
MDC IDC LEAD IMPLANT DT: 20090429
MDC IDC LEAD LOCATION: 753858
MDC IDC LEAD LOCATION: 753860
MDC IDC MSMT LEADCHNL LV IMPEDANCE VALUE: 460 Ohm
MDC IDC MSMT LEADCHNL RA IMPEDANCE VALUE: 280 Ohm
MDC IDC MSMT LEADCHNL RA PACING THRESHOLD PULSEWIDTH: 0.5 ms
MDC IDC MSMT LEADCHNL RA SENSING INTR AMPL: 2 mV
MDC IDC MSMT LEADCHNL RV SENSING INTR AMPL: 12 mV
MDC IDC PG IMPLANT DT: 20150211
MDC IDC PG SERIAL: 7170199
MDC IDC SET LEADCHNL LV PACING AMPLITUDE: 1.5 V
MDC IDC SET LEADCHNL RV PACING PULSEWIDTH: 1 ms
MDC IDC STAT BRADY AP VS PERCENT: 1 %
MDC IDC STAT BRADY AS VS PERCENT: 2.7 %

## 2017-04-17 ENCOUNTER — Encounter: Payer: Self-pay | Admitting: Cardiology

## 2017-04-17 DIAGNOSIS — G894 Chronic pain syndrome: Secondary | ICD-10-CM | POA: Diagnosis not present

## 2017-04-17 DIAGNOSIS — E669 Obesity, unspecified: Secondary | ICD-10-CM | POA: Diagnosis not present

## 2017-04-17 DIAGNOSIS — Z79891 Long term (current) use of opiate analgesic: Secondary | ICD-10-CM | POA: Diagnosis not present

## 2017-04-17 DIAGNOSIS — M79606 Pain in leg, unspecified: Secondary | ICD-10-CM | POA: Diagnosis not present

## 2017-04-17 DIAGNOSIS — Z79899 Other long term (current) drug therapy: Secondary | ICD-10-CM | POA: Diagnosis not present

## 2017-04-17 DIAGNOSIS — R51 Headache: Secondary | ICD-10-CM | POA: Diagnosis not present

## 2017-04-17 DIAGNOSIS — G500 Trigeminal neuralgia: Secondary | ICD-10-CM | POA: Diagnosis not present

## 2017-04-22 ENCOUNTER — Other Ambulatory Visit: Payer: Self-pay | Admitting: Cardiology

## 2017-04-27 DIAGNOSIS — R251 Tremor, unspecified: Secondary | ICD-10-CM | POA: Diagnosis not present

## 2017-04-27 DIAGNOSIS — G44091 Other trigeminal autonomic cephalgias (TAC), intractable: Secondary | ICD-10-CM | POA: Diagnosis not present

## 2017-05-01 ENCOUNTER — Telehealth: Payer: Self-pay | Admitting: Cardiology

## 2017-05-01 NOTE — Telephone Encounter (Signed)
Patient called complaining of "really bad shortness of breath." She states it has been "pretty bad" for about a week, but it got really bad this afternoon. It is worse on exertion than at rest. She does not notice any swelling and states her weight is stable. She is taking her medications as instructed. The patient is audibly short of breath on the phone and wheezes are heard. Instructed the patient to proceed to the ED for evaluation of SOB. She was grateful for call and agrees with treatment plan.

## 2017-05-01 NOTE — Telephone Encounter (Signed)
New message   Pt c/o Shortness Of Breath: STAT if SOB developed within the last 24 hours or pt is noticeably SOB on the phone  1. Are you currently SOB (can you hear that pt is SOB on the phone)? yes  2. How long have you been experiencing SOB? For a week  3. Are you SOB when sitting or when up moving around? both  4. Are you currently experiencing any other symptoms? no

## 2017-05-13 ENCOUNTER — Encounter: Payer: Self-pay | Admitting: Cardiology

## 2017-05-13 ENCOUNTER — Ambulatory Visit: Payer: Medicare Other | Admitting: Cardiology

## 2017-05-13 VITALS — BP 100/68 | HR 86 | Ht 64.0 in | Wt 161.4 lb

## 2017-05-13 DIAGNOSIS — I255 Ischemic cardiomyopathy: Secondary | ICD-10-CM | POA: Diagnosis not present

## 2017-05-13 DIAGNOSIS — I5022 Chronic systolic (congestive) heart failure: Secondary | ICD-10-CM | POA: Diagnosis not present

## 2017-05-13 DIAGNOSIS — Z720 Tobacco use: Secondary | ICD-10-CM | POA: Diagnosis not present

## 2017-05-13 DIAGNOSIS — Z95810 Presence of automatic (implantable) cardiac defibrillator: Secondary | ICD-10-CM

## 2017-05-13 NOTE — Patient Instructions (Addendum)
Medication Instructions:  1) For the next 3 days, take Lasix 80mg  twice daily  Labwork: None  Testing/Procedures: None  Follow-Up: Your physician wants you to follow-up in: 6 months with [name]. You will receive a reminder letter in the mail two months in advance. If you don't receive a letter, please call our office to schedule the follow-up appointment.   Any Other Special Instructions Will Be Listed Below (If Applicable).     If you need a refill on your cardiac medications before your next appointment, please call your pharmacy.

## 2017-05-13 NOTE — Progress Notes (Signed)
[facility]. [address], [address] [place], [place]  [address] Phone: [phone] Fax:  [phone]  Date:  05/13/2017   ID:  [name], DOB 1946-02-29, MRN [id number]  PCP:  Shirline Frees, MD   History of Present Illness: [name] is a 72 y.o. female with ischemic cardiomyopathy ejection fraction in the 15-20% range, Ischemic cardiomyopathy status post Bi-V pacemaker, coronary artery disease with bare-metal stent implantation for in-stent restenosis LAD.  She underwent a nuclear stress test  which was high risk, EF 19%, large area of infarct.  She has had 2 episodes of chest discomfort that was quite severe and took 2 nitroglycerin. This subsided. She's no longer having any discomfort.  Dr. Caryl Comes  provided her with AV optimization of her device. She was told that her device at baseline was fairly synchronized.  She is battled with long-standing depression.    In 2012, we had a long discussion about her cardiomyopathy and overall prognosis.   10/28/16  - Blood pressure is quite low again today. In the 80s. She has had this in the past. She's not feeling any chest pain. No significant shortness of breath. Over the last week or 2 she has noticed some increased swelling in her left lower extremity. She states that this is unusual for her. No pain.   05/13/17  -Overall has been having some shortness of breath.  Could be smoking/lung related versus cardiomyopathy related.  We have asked her to double up her Lasix over the next 3 days.  No chest pain, no syncope, no bleeding, no orthopnea.    Wt Readings from Last 3 Encounters:  05/13/17 161 lb 6.4 oz (73.2 kg)  10/28/16 165 lb 9.6 oz (75.1 kg)  09/29/16 163 lb 9.6 oz (74.2 kg)     Past Medical History:  Diagnosis Date  . Anemia   . CAD (coronary artery disease)    Prior LAD stenting with stent thrombosis occurring in a withdrawal phase of her Plavix undertaken because of trigeminal neuralgia and release surgery. It  was treated medically.  . Cancer ([facility])    small bladder cancer removed no  treatment  . Cataract   . CHF (congestive heart failure) ([facility])   . Complication of anesthesia    massive heart attack  . GERD (gastroesophageal reflux disease)   . Hyperlipidemia   . Ischemic cardiomyopathy   . Osteoarthritis   . SOB (shortness of breath)     Past Surgical History:  Procedure Laterality Date  . APPENDECTOMY    . BACK SURGERY     x2 back surgeries  . BI-VENTRICULAR IMPLANTABLE CARDIOVERTER DEFIBRILLATOR  (CRT-D)  2009; 05/2013   STJ CRTD generator change by Dr Caryl Comes 05/2013  . BIV PACEMAKER GENERATOR CHANGE OUT Bilateral 05/11/2013   Procedure: BIV PACEMAKER GENERATOR CHANGE OUT;  Surgeon: Deboraha Sprang, MD;  Location: [facility] CATH LAB;  Service: Cardiovascular;  Laterality: Bilateral;  . BRAIN SURGERY     x3  . EYE SURGERY Right 05/24/12  . JOINT REPLACEMENT     thumbs and knees replacement  . TUBAL LIGATION      Current Outpatient Medications  Medication Sig Dispense Refill  . aspirin EC 81 MG tablet Take 1 tablet (81 mg total) by mouth daily.    . carbamazepine (TEGRETOL) 100 MG chewable tablet Chew 100 mg by mouth 2 (two) times daily.   2  . carvedilol (COREG) 3.125 MG tablet TAKE ONE TABLET BY MOUTH TWICE DAILY  WITH A MEAL 60 tablet 9  . clorazepate (TRANXENE) 7.5 MG tablet Take 7.5 mg by mouth at bedtime.   4  . cyclobenzaprine (FLEXERIL) 5 MG tablet Take 1 tablet (5 mg total) by mouth 3 (three) times daily as needed for muscle spasms. 30 tablet 0  . furosemide (LASIX) 80 MG tablet TAKE ONE TABLET BY MOUTH DAILY 30 tablet 8  . gabapentin (NEURONTIN) 800 MG tablet Take 800 mg by mouth 4 (four) times daily as needed. For pain    . iron polysaccharides (NIFEREX) 150 MG capsule Take 150 mg by mouth 2 (two) times daily.    Marland Kitchen levothyroxine (SYNTHROID, LEVOTHROID) 112 MCG tablet TAKE ONE TABLET BY MOUTH EVERY DAY 30 tablet 3  . mirtazapine (REMERON) 30 MG tablet Take 30 mg by mouth at bedtime.   5  . nitroGLYCERIN (NITROSTAT) 0.4 MG SL tablet TAKE ONE TABLET UNDER THE TONGUE AS DIRECTED FOR CHEST PAIN 25 tablet 5  . NONFORMULARY OR COMPOUNDED ITEM Apply 1-2 g topically 4 (four) times daily. 120 each 2  . Oxycodone HCl 10 MG TABS Take 1 tablet by mouth every 6 (six) hours as needed for pain.  0  . potassium chloride SA (K-DUR,KLOR-CON) 20 MEQ tablet TAKE ONE TABLET BY MOUTH DAILY 30 tablet 9  . promethazine (PHENERGAN) 25 MG tablet Take 1 tablet (25 mg total) by mouth every 6 (six) hours as needed. for nasuea 90 tablet 1  . ramipril (ALTACE) 2.5 MG capsule TAKE 1 CAPSULE (2.5 MG TOTAL) BY MOUTH DAILY. 90 capsule 2  . simvastatin (ZOCOR) 10 MG tablet TAKE ONE TABLET BY MOUTH EVERY DAY 90 tablet 2  . spironolactone (ALDACTONE) 25 MG tablet TAKE 1/2 TABLET BY MOUTH EVERY DAY 45 tablet 0  . valACYclovir (VALTREX) 500 MG tablet Take 1 tablet by mouth as directed.    . venlafaxine XR (EFFEXOR-XR) 150 MG 24 hr capsule Take 150 mg by mouth daily with breakfast.   4  . VENTOLIN HFA 108 (90 Base) MCG/ACT inhaler Inhale 2 puffs into the lungs as needed.  4  . zolpidem (AMBIEN) 10 MG tablet Take 10 mg by mouth at bedtime. For sleep     Current Facility-Administered Medications  Medication Dose Route Frequency Provider Last Rate Last Dose  . betamethasone acetate-betamethasone sodium phosphate (CELESTONE) injection 3 mg  3 mg Intramuscular Once Edrick Kins, DPM        Allergies:   No Known Allergies  Social History:  The patient  reports that she has been smoking cigarettes.  She has a 15.00 pack-year smoking history. she has never used smokeless tobacco. She reports that she does not drink alcohol or use drugs.   ROS:  Please see the history of present illness.     PHYSICAL EXAM: VS:  BP 100/68   Pulse 86   Ht 5\' 4"  (1.626 m)   Wt 161 lb 6.4 oz (73.2 kg)   LMP  (LMP Unknown)   BMI 27.70 kg/m  GEN: Well nourished, well developed, in no acute distress  HEENT: normal, +JVD Neck: no  carotid bruits, or masses Cardiac: RRR; no murmurs, rubs, or gallops,no edema  Respiratory:  clear to auscultation bilaterally, normal work of breathing GI: soft, nontender, nondistended, + BS MS: no deformity or atrophy  Skin: warm and dry, no rash Neuro:  Alert and Oriented x 3, Strength and sensation are intact Psych: euthymic mood, full affect    EKG:  EKG performed today 04/29/16-ventricular pacing, biventricular pacing. Heart  rate 74 bpm. 04/26/14 - Ventricular pacing, sinus rhythm, 87.     NUC stress 04/19/15:  Nuclear stress EF: 19%.  This is a high risk study.  Findings consistent with prior myocardial infarction.  The left ventricular ejection fraction is severely decreased (<30%).  High risk stress nuclear study with large, severe, fixed anterior, septal, apical and distal inferior defect consistent with prior infarct; no ischemia; EF 19 with akinesis of the anteroseptal wall and apex; severe LVE; study high risk due to reduced LV function.  ASSESSMENT AND PLAN:  Chronic systolic heart failure-currently mildly decompensated. Ejection fraction of 19% range.  NYHA class II to III symptoms. BP previously in the 80s. Still warn her about risks of driving because of her medications and risks to others on the road. She would not be a candidate for Entresto based upon BP. She states that she is not having any trigeminal neuralgia pain.  -For the next 3 days I would like for her to take additional Lasix 80 mg twice a day.  I think in some respects, her lung condition, heavy smoking, probable COPD is playing a role.  Biventricular ICD-fairly well synchronized according to review of Dr. Olin Pia note.  Doing well.  Left lower extremity edema- negative DVT in the past, she calls this her "heart leg."There seems to be increased swelling in the her left greater than right extremity.  Trying to increase her Lasix consumption.  Hypotension-she is not a candidate for increased  cardiomyopathy therapy.  She understands the consequences of potential crash.  Tobacco use-encouraged cessation. Albuterol.    6 month follow up or sooner.  Signed, Candee Furbish, MD [facility]  05/13/2017 3:56 PM

## 2017-05-14 ENCOUNTER — Ambulatory Visit (INDEPENDENT_AMBULATORY_CARE_PROVIDER_SITE_OTHER): Payer: Medicare Other

## 2017-05-14 ENCOUNTER — Telehealth: Payer: Self-pay

## 2017-05-14 DIAGNOSIS — M79606 Pain in leg, unspecified: Secondary | ICD-10-CM | POA: Diagnosis not present

## 2017-05-14 DIAGNOSIS — R51 Headache: Secondary | ICD-10-CM | POA: Diagnosis not present

## 2017-05-14 DIAGNOSIS — G894 Chronic pain syndrome: Secondary | ICD-10-CM | POA: Diagnosis not present

## 2017-05-14 DIAGNOSIS — Z79899 Other long term (current) drug therapy: Secondary | ICD-10-CM | POA: Diagnosis not present

## 2017-05-14 DIAGNOSIS — Z79891 Long term (current) use of opiate analgesic: Secondary | ICD-10-CM | POA: Diagnosis not present

## 2017-05-14 DIAGNOSIS — Z95810 Presence of automatic (implantable) cardiac defibrillator: Secondary | ICD-10-CM | POA: Diagnosis not present

## 2017-05-14 DIAGNOSIS — G500 Trigeminal neuralgia: Secondary | ICD-10-CM | POA: Diagnosis not present

## 2017-05-14 DIAGNOSIS — I5022 Chronic systolic (congestive) heart failure: Secondary | ICD-10-CM | POA: Diagnosis not present

## 2017-05-14 NOTE — Telephone Encounter (Signed)
Remote ICM transmission received.  Attempted call to patient and line was busy x 2.

## 2017-05-14 NOTE — Progress Notes (Signed)
EPIC Encounter for ICM Monitoring  Patient Name: [name] is a 72 y.o. female Date: 05/14/2017 Primary Care Physican: Shirline Frees, MD Primary Cardiologist:Skains Electrophysiologist: Caryl Comes Dry Weight:161 lb (office weight 2/13)  Bi-V Pacing: 94%              Attempted call to patient and unable to reach due to line was busy.  Transmission reviewed.    Thoracic impedance normal.  Dr Marlou Porch instructed at office visit on 2/13 to take Furosemide 80 mg twice a day x 3 days.  Prescribed dosage: Furosemide 80 mg 1 tablet daily.    Recommendations: NONE - Unable to reach.  Follow-up plan: ICM clinic phone appointment on 06/15/2017.    Copy of ICM check sent to [name] and Dr. Caryl Comes.   3 month ICM trend: 05/14/2017    1 Year ICM trend:       [name], RN 05/14/2017 4:06 PM

## 2017-05-15 DIAGNOSIS — J449 Chronic obstructive pulmonary disease, unspecified: Secondary | ICD-10-CM | POA: Diagnosis not present

## 2017-05-15 DIAGNOSIS — G500 Trigeminal neuralgia: Secondary | ICD-10-CM | POA: Diagnosis not present

## 2017-05-15 DIAGNOSIS — F324 Major depressive disorder, single episode, in partial remission: Secondary | ICD-10-CM | POA: Diagnosis not present

## 2017-05-15 DIAGNOSIS — E7800 Pure hypercholesterolemia, unspecified: Secondary | ICD-10-CM | POA: Diagnosis not present

## 2017-06-03 DIAGNOSIS — J441 Chronic obstructive pulmonary disease with (acute) exacerbation: Secondary | ICD-10-CM | POA: Diagnosis not present

## 2017-06-03 DIAGNOSIS — F17210 Nicotine dependence, cigarettes, uncomplicated: Secondary | ICD-10-CM | POA: Diagnosis not present

## 2017-06-08 ENCOUNTER — Other Ambulatory Visit (HOSPITAL_COMMUNITY): Payer: Self-pay | Admitting: Psychiatry

## 2017-06-08 DIAGNOSIS — G44091 Other trigeminal autonomic cephalgias (TAC), intractable: Secondary | ICD-10-CM | POA: Diagnosis not present

## 2017-06-08 DIAGNOSIS — R251 Tremor, unspecified: Secondary | ICD-10-CM | POA: Diagnosis not present

## 2017-06-11 DIAGNOSIS — R51 Headache: Secondary | ICD-10-CM | POA: Diagnosis not present

## 2017-06-11 DIAGNOSIS — Z79899 Other long term (current) drug therapy: Secondary | ICD-10-CM | POA: Diagnosis not present

## 2017-06-11 DIAGNOSIS — Z79891 Long term (current) use of opiate analgesic: Secondary | ICD-10-CM | POA: Diagnosis not present

## 2017-06-11 DIAGNOSIS — G500 Trigeminal neuralgia: Secondary | ICD-10-CM | POA: Diagnosis not present

## 2017-06-11 DIAGNOSIS — M79606 Pain in leg, unspecified: Secondary | ICD-10-CM | POA: Diagnosis not present

## 2017-06-11 DIAGNOSIS — G894 Chronic pain syndrome: Secondary | ICD-10-CM | POA: Diagnosis not present

## 2017-06-12 ENCOUNTER — Other Ambulatory Visit (HOSPITAL_COMMUNITY): Payer: Self-pay | Admitting: Psychiatry

## 2017-06-15 ENCOUNTER — Ambulatory Visit (INDEPENDENT_AMBULATORY_CARE_PROVIDER_SITE_OTHER): Payer: Medicare Other

## 2017-06-15 DIAGNOSIS — Z95810 Presence of automatic (implantable) cardiac defibrillator: Secondary | ICD-10-CM

## 2017-06-15 DIAGNOSIS — I5022 Chronic systolic (congestive) heart failure: Secondary | ICD-10-CM | POA: Diagnosis not present

## 2017-06-16 NOTE — Progress Notes (Signed)
EPIC Encounter for ICM Monitoring  Patient Name: [name] is a 72 y.o. female Date: 06/16/2017 Primary Care Physican: Shirline Frees, MD Primary Cardiologist:Skains Electrophysiologist: Faustino Congress Weight:161lb (office weight 2/13)  Bi-V Pacing: 94%      Attempted call to patient and unable to reach.  Left detailed message regarding transmission.  Transmission reviewed.    Thoracic impedance normal.  Prescribed dosage: Furosemide 80 mg 1 tablet daily.  Recommendations: Left voice mail with ICM number and encouraged to call if experiencing any fluid symptoms.  Follow-up plan: ICM clinic phone appointment on 07/16/2017.  Recall appointment for April with Dr Caryl Comes.  Copy of ICM check sent to Dr. Caryl Comes.   3 month ICM trend: 06/15/2017    1 Year ICM trend:       [name], RN 06/16/2017 1:02 PM

## 2017-06-24 ENCOUNTER — Other Ambulatory Visit: Payer: Self-pay | Admitting: Endocrinology

## 2017-07-02 ENCOUNTER — Other Ambulatory Visit: Payer: Self-pay | Admitting: Cardiology

## 2017-07-09 DIAGNOSIS — M545 Low back pain: Secondary | ICD-10-CM | POA: Diagnosis not present

## 2017-07-09 DIAGNOSIS — G500 Trigeminal neuralgia: Secondary | ICD-10-CM | POA: Diagnosis not present

## 2017-07-09 DIAGNOSIS — M79606 Pain in leg, unspecified: Secondary | ICD-10-CM | POA: Diagnosis not present

## 2017-07-09 DIAGNOSIS — Z79899 Other long term (current) drug therapy: Secondary | ICD-10-CM | POA: Diagnosis not present

## 2017-07-09 DIAGNOSIS — Z79891 Long term (current) use of opiate analgesic: Secondary | ICD-10-CM | POA: Diagnosis not present

## 2017-07-09 DIAGNOSIS — G894 Chronic pain syndrome: Secondary | ICD-10-CM | POA: Diagnosis not present

## 2017-07-14 ENCOUNTER — Other Ambulatory Visit (HOSPITAL_COMMUNITY): Payer: Self-pay | Admitting: Psychiatry

## 2017-07-15 ENCOUNTER — Other Ambulatory Visit: Payer: Self-pay | Admitting: Cardiology

## 2017-07-15 MED ORDER — SPIRONOLACTONE 25 MG PO TABS: 12.5000 mg | ORAL_TABLET | Freq: Every day | ORAL | 3 refills | Status: DC

## 2017-07-16 ENCOUNTER — Ambulatory Visit (INDEPENDENT_AMBULATORY_CARE_PROVIDER_SITE_OTHER): Payer: Medicare Other | Admitting: *Deleted

## 2017-07-16 ENCOUNTER — Telehealth: Payer: Self-pay

## 2017-07-16 DIAGNOSIS — Z95810 Presence of automatic (implantable) cardiac defibrillator: Secondary | ICD-10-CM | POA: Diagnosis not present

## 2017-07-16 DIAGNOSIS — I255 Ischemic cardiomyopathy: Secondary | ICD-10-CM | POA: Diagnosis not present

## 2017-07-16 DIAGNOSIS — I5022 Chronic systolic (congestive) heart failure: Secondary | ICD-10-CM

## 2017-07-16 NOTE — Progress Notes (Signed)
EPIC Encounter for ICM Monitoring  Patient Name: [name] is a 72 y.o. female Date: 07/16/2017 Primary Care Physican: Shirline Frees, MD Primary Cardiologist:Skains Electrophysiologist: Caryl Comes Dry Weight:161lb(office weight 2/13) Bi-V Pacing: 95%        Attempted call to patient and unable to reach.   Transmission reviewed.    Thoracic impedance abnormal suggesting fluid accumulation but trending back toward baseline.   Prescribed dosage: Furosemide 80 mg 1 tablet daily.  Recommendations: NONE - Unable to reach.  Follow-up plan: ICM clinic phone appointment on 07/30/2017 to recheck fluid levels.    Copy of ICM check sent to Dr. Caryl Comes and [name] for review and recommendations if needed.   3 month ICM trend: 07/16/2017    1 Year ICM trend:       [name], RN 07/16/2017 11:39 AM

## 2017-07-16 NOTE — Progress Notes (Signed)
Remote ICD transmission.   

## 2017-07-16 NOTE — Telephone Encounter (Signed)
Remote ICM transmission received.  Attempted call to patient and no answer or answering machine.  

## 2017-07-17 ENCOUNTER — Encounter: Payer: Self-pay | Admitting: Cardiology

## 2017-07-17 NOTE — Progress Notes (Signed)
Agree with plan Kayman Snuffer, MD  

## 2017-07-22 LAB — CUP PACEART REMOTE DEVICE CHECK
Battery Remaining Longevity: 26 mo
Battery Remaining Percentage: 37 %
Brady Statistic AP VP Percent: 9.1 %
HIGH POWER IMPEDANCE MEASURED VALUE: 36 Ohm
HIGH POWER IMPEDANCE MEASURED VALUE: 36 Ohm
Implantable Lead Implant Date: 20090429
Implantable Lead Implant Date: 20090429
Implantable Lead Location: 753859
Implantable Pulse Generator Implant Date: 20150211
Lead Channel Impedance Value: 260 Ohm
Lead Channel Impedance Value: 360 Ohm
Lead Channel Pacing Threshold Amplitude: 0.75 V
Lead Channel Pacing Threshold Amplitude: 0.75 V
Lead Channel Pacing Threshold Pulse Width: 0.5 ms
Lead Channel Pacing Threshold Pulse Width: 0.5 ms
Lead Channel Pacing Threshold Pulse Width: 1 ms
Lead Channel Setting Pacing Amplitude: 2.5 V
Lead Channel Setting Pacing Pulse Width: 0.5 ms
Lead Channel Setting Pacing Pulse Width: 1 ms
Lead Channel Setting Sensing Sensitivity: 0.5 mV
MDC IDC LEAD IMPLANT DT: 20090429
MDC IDC LEAD LOCATION: 753858
MDC IDC LEAD LOCATION: 753860
MDC IDC MSMT BATTERY VOLTAGE: 2.89 V
MDC IDC MSMT LEADCHNL LV IMPEDANCE VALUE: 450 Ohm
MDC IDC MSMT LEADCHNL RA SENSING INTR AMPL: 1.6 mV
MDC IDC MSMT LEADCHNL RV PACING THRESHOLD AMPLITUDE: 1.25 V
MDC IDC MSMT LEADCHNL RV SENSING INTR AMPL: 9.9 mV
MDC IDC PG SERIAL: 7170199
MDC IDC SESS DTM: 20190418100628
MDC IDC SET LEADCHNL LV PACING AMPLITUDE: 1.5 V
MDC IDC SET LEADCHNL RA PACING AMPLITUDE: 2 V
MDC IDC STAT BRADY AP VS PERCENT: 1 %
MDC IDC STAT BRADY AS VP PERCENT: 86 %
MDC IDC STAT BRADY AS VS PERCENT: 2.2 %
MDC IDC STAT BRADY RA PERCENT PACED: 7.5 %

## 2017-07-23 DIAGNOSIS — G894 Chronic pain syndrome: Secondary | ICD-10-CM | POA: Diagnosis not present

## 2017-07-23 DIAGNOSIS — M79606 Pain in leg, unspecified: Secondary | ICD-10-CM | POA: Diagnosis not present

## 2017-07-23 DIAGNOSIS — Z79891 Long term (current) use of opiate analgesic: Secondary | ICD-10-CM | POA: Diagnosis not present

## 2017-07-23 DIAGNOSIS — G500 Trigeminal neuralgia: Secondary | ICD-10-CM | POA: Diagnosis not present

## 2017-07-23 DIAGNOSIS — R51 Headache: Secondary | ICD-10-CM | POA: Diagnosis not present

## 2017-07-23 DIAGNOSIS — Z79899 Other long term (current) drug therapy: Secondary | ICD-10-CM | POA: Diagnosis not present

## 2017-07-30 ENCOUNTER — Telehealth: Payer: Self-pay | Admitting: Cardiology

## 2017-07-30 ENCOUNTER — Ambulatory Visit (INDEPENDENT_AMBULATORY_CARE_PROVIDER_SITE_OTHER): Payer: Self-pay

## 2017-07-30 DIAGNOSIS — I5022 Chronic systolic (congestive) heart failure: Secondary | ICD-10-CM

## 2017-07-30 DIAGNOSIS — Z95810 Presence of automatic (implantable) cardiac defibrillator: Secondary | ICD-10-CM

## 2017-07-30 NOTE — Telephone Encounter (Signed)
LMOVM reminding pt to send remote transmission.   

## 2017-07-31 ENCOUNTER — Telehealth: Payer: Self-pay

## 2017-07-31 NOTE — Telephone Encounter (Signed)
Remote ICM transmission received.  Attempted call to patient and line busy.  

## 2017-07-31 NOTE — Progress Notes (Signed)
EPIC Encounter for ICM Monitoring  Patient Name: [name] is a 72 y.o. female Date: 07/31/2017 Primary Care Physican: Shirline Frees, MD Primary Cardiologist:Skains Electrophysiologist: Caryl Comes Dry Weight:161lb(office weight 2/13) Bi-V Pacing: 95%       Attempted call to patient and unable to reach.   Transmission reviewed.    Thoracic impedance returned to normal since 07/16/2017 transmission but is trending above baseline suggesting dryness.  Prescribed dosage: Furosemide 80 mg 1 tablet daily.  Recommendations: NONE - Unable to reach.  Follow-up plan: ICM clinic phone appointment on 08/31/2017.    Copy of ICM check sent to Dr. Caryl Comes.   3 month ICM trend: 07/31/2017    1 Year ICM trend:       [name], RN 07/31/2017 7:59 AM

## 2017-08-05 DIAGNOSIS — G44091 Other trigeminal autonomic cephalgias (TAC), intractable: Secondary | ICD-10-CM | POA: Diagnosis not present

## 2017-08-05 DIAGNOSIS — G44099 Other trigeminal autonomic cephalgias (TAC), not intractable: Secondary | ICD-10-CM | POA: Diagnosis not present

## 2017-08-11 ENCOUNTER — Other Ambulatory Visit: Payer: Self-pay | Admitting: Cardiology

## 2017-08-13 ENCOUNTER — Other Ambulatory Visit (HOSPITAL_COMMUNITY): Payer: Self-pay | Admitting: Psychiatry

## 2017-08-21 DIAGNOSIS — G500 Trigeminal neuralgia: Secondary | ICD-10-CM | POA: Diagnosis not present

## 2017-08-21 DIAGNOSIS — Z79899 Other long term (current) drug therapy: Secondary | ICD-10-CM | POA: Diagnosis not present

## 2017-08-21 DIAGNOSIS — M79606 Pain in leg, unspecified: Secondary | ICD-10-CM | POA: Diagnosis not present

## 2017-08-21 DIAGNOSIS — G894 Chronic pain syndrome: Secondary | ICD-10-CM | POA: Diagnosis not present

## 2017-08-21 DIAGNOSIS — R51 Headache: Secondary | ICD-10-CM | POA: Diagnosis not present

## 2017-08-21 DIAGNOSIS — Z79891 Long term (current) use of opiate analgesic: Secondary | ICD-10-CM | POA: Diagnosis not present

## 2017-08-23 ENCOUNTER — Telehealth: Payer: Self-pay | Admitting: Cardiology

## 2017-08-23 NOTE — Telephone Encounter (Signed)
Pt called after she had chest pain, she took 3 NTG with relief, her device did not discharge.   She has no SOB.  This is first time she has used NTG.  I encouraged her to come to ER now to be evaluated and she does not want too, if any more pain, she will call 911.   I will send message to triage to schedule appt.

## 2017-08-25 ENCOUNTER — Telehealth: Payer: Self-pay

## 2017-08-25 NOTE — Telephone Encounter (Signed)
Ok thanks 

## 2017-08-25 NOTE — Telephone Encounter (Signed)
Spoke with patient about appt availability   We had an appt open for today 5/28 @ 9:30, but patient said that she has company from out of town and is not interested in an appt at this time.    She said that she is feeling fine now and will go to the ED if things worsen.  She will call us back if she feels that she needs to see someone.

## 2017-08-27 ENCOUNTER — Other Ambulatory Visit (HOSPITAL_COMMUNITY): Payer: Self-pay | Admitting: Psychiatry

## 2017-08-31 ENCOUNTER — Telehealth: Payer: Self-pay

## 2017-08-31 ENCOUNTER — Telehealth: Payer: Self-pay | Admitting: Cardiology

## 2017-08-31 NOTE — Telephone Encounter (Signed)
Attempted to confirm remote transmission with pt. No answer and was unable to leave a message.   

## 2017-08-31 NOTE — Telephone Encounter (Signed)
Spoke with pt and reminded pt of remote transmission that is due today. Pt verbalized understanding.   

## 2017-09-03 NOTE — Progress Notes (Signed)
No ICM remote transmission received for 08/31/2017 and next ICM transmission scheduled for 09/24/2017.

## 2017-09-04 ENCOUNTER — Telehealth: Payer: Self-pay | Admitting: Cardiology

## 2017-09-04 NOTE — Telephone Encounter (Signed)
Attempted to call pt b/c her home monitor has not updated in at least 14 days. No answer and unable to leave a message. Line is busy.

## 2017-09-09 ENCOUNTER — Other Ambulatory Visit: Payer: Self-pay | Admitting: Cardiology

## 2017-09-09 ENCOUNTER — Other Ambulatory Visit: Payer: Self-pay | Admitting: Endocrinology

## 2017-09-14 ENCOUNTER — Telehealth: Payer: Self-pay | Admitting: Cardiology

## 2017-09-14 NOTE — Telephone Encounter (Signed)
Attempted to call pt b/c her home monitor has not updated in at least 7 days. No answer and unable to leave a message.

## 2017-09-17 DIAGNOSIS — G894 Chronic pain syndrome: Secondary | ICD-10-CM | POA: Diagnosis not present

## 2017-09-17 DIAGNOSIS — R51 Headache: Secondary | ICD-10-CM | POA: Diagnosis not present

## 2017-09-17 DIAGNOSIS — M961 Postlaminectomy syndrome, not elsewhere classified: Secondary | ICD-10-CM | POA: Diagnosis not present

## 2017-09-17 DIAGNOSIS — Z79899 Other long term (current) drug therapy: Secondary | ICD-10-CM | POA: Diagnosis not present

## 2017-09-17 DIAGNOSIS — Z79891 Long term (current) use of opiate analgesic: Secondary | ICD-10-CM | POA: Diagnosis not present

## 2017-09-17 DIAGNOSIS — G500 Trigeminal neuralgia: Secondary | ICD-10-CM | POA: Diagnosis not present

## 2017-09-18 ENCOUNTER — Encounter: Payer: Self-pay | Admitting: Cardiology

## 2017-09-18 ENCOUNTER — Other Ambulatory Visit: Payer: Self-pay | Admitting: Pain Medicine

## 2017-09-18 DIAGNOSIS — M545 Low back pain: Secondary | ICD-10-CM

## 2017-09-24 ENCOUNTER — Telehealth: Payer: Self-pay

## 2017-09-24 NOTE — Telephone Encounter (Signed)
Spoke with pt and reminded pt of remote transmission that is due today. Pt verbalized understanding.   

## 2017-09-28 ENCOUNTER — Telehealth: Payer: Self-pay | Admitting: Cardiology

## 2017-09-28 NOTE — Progress Notes (Signed)
No ICM remote transmission received for 09/24/2017 and next ICM transmission scheduled for 10/15/2017.

## 2017-09-28 NOTE — Telephone Encounter (Signed)
Spoke w/ pt and requested that she send a manual transmission b/c her home monitor has not updated in at least 14 days.   

## 2017-10-05 ENCOUNTER — Other Ambulatory Visit: Payer: Self-pay | Admitting: Cardiology

## 2017-10-15 ENCOUNTER — Telehealth: Payer: Self-pay

## 2017-10-15 ENCOUNTER — Ambulatory Visit (INDEPENDENT_AMBULATORY_CARE_PROVIDER_SITE_OTHER): Payer: Medicare Other

## 2017-10-15 ENCOUNTER — Ambulatory Visit (INDEPENDENT_AMBULATORY_CARE_PROVIDER_SITE_OTHER): Payer: Medicare Other | Admitting: *Deleted

## 2017-10-15 DIAGNOSIS — I5022 Chronic systolic (congestive) heart failure: Secondary | ICD-10-CM | POA: Diagnosis not present

## 2017-10-15 DIAGNOSIS — M79606 Pain in leg, unspecified: Secondary | ICD-10-CM | POA: Diagnosis not present

## 2017-10-15 DIAGNOSIS — I255 Ischemic cardiomyopathy: Secondary | ICD-10-CM | POA: Diagnosis not present

## 2017-10-15 DIAGNOSIS — Z95810 Presence of automatic (implantable) cardiac defibrillator: Secondary | ICD-10-CM | POA: Diagnosis not present

## 2017-10-15 DIAGNOSIS — M961 Postlaminectomy syndrome, not elsewhere classified: Secondary | ICD-10-CM | POA: Diagnosis not present

## 2017-10-15 DIAGNOSIS — Z79891 Long term (current) use of opiate analgesic: Secondary | ICD-10-CM | POA: Diagnosis not present

## 2017-10-15 DIAGNOSIS — G500 Trigeminal neuralgia: Secondary | ICD-10-CM | POA: Diagnosis not present

## 2017-10-15 DIAGNOSIS — G894 Chronic pain syndrome: Secondary | ICD-10-CM | POA: Diagnosis not present

## 2017-10-15 DIAGNOSIS — Z79899 Other long term (current) drug therapy: Secondary | ICD-10-CM | POA: Diagnosis not present

## 2017-10-15 NOTE — Progress Notes (Signed)
EPIC Encounter for ICM Monitoring  Patient Name: [name] is a 72 y.o. female Date: 10/15/2017 Primary Care Physican: Shirline Frees, MD Primary Cardiologist:Skains Electrophysiologist: Caryl Comes Dry Weight: no recent weight Bi-V Pacing: 95%       Attempted call to patient and unable to reach.  Left detailed message, per DPR, regarding transmission.  Transmission reviewed.    Thoracic impedance normal but was abnormal suggesting fluid accumulation from 10/10/2017 - 10/13/2017.  Prescribed dosage: Furosemide 80 mg 1 tablet daily.  Recommendations: Left voice mail with ICM number and encouraged to call if experiencing any fluid symptoms.  Follow-up plan: ICM clinic phone appointment on 11/16/2017.    Copy of ICM check sent to Dr. Caryl Comes.   3 month ICM trend: 10/15/2017    1 Year ICM trend:       [name], RN 10/15/2017 2:41 PM

## 2017-10-15 NOTE — Telephone Encounter (Signed)
Remote ICM transmission received.  Attempted call to patient and left detailed message, per DPR, regarding transmission and next ICM scheduled for 11/16/2017.  Advised to return call for any fluid symptoms or questions.    

## 2017-10-15 NOTE — Progress Notes (Signed)
Remote ICD transmission.   

## 2017-10-16 ENCOUNTER — Encounter: Payer: Self-pay | Admitting: Cardiology

## 2017-10-21 ENCOUNTER — Other Ambulatory Visit: Payer: Self-pay | Admitting: Physician Assistant

## 2017-10-21 ENCOUNTER — Other Ambulatory Visit (HOSPITAL_COMMUNITY): Payer: Self-pay | Admitting: Psychiatry

## 2017-10-21 DIAGNOSIS — I25119 Atherosclerotic heart disease of native coronary artery with unspecified angina pectoris: Secondary | ICD-10-CM

## 2017-10-21 DIAGNOSIS — I5022 Chronic systolic (congestive) heart failure: Secondary | ICD-10-CM

## 2017-10-21 DIAGNOSIS — Z95810 Presence of automatic (implantable) cardiac defibrillator: Secondary | ICD-10-CM

## 2017-10-21 DIAGNOSIS — E785 Hyperlipidemia, unspecified: Secondary | ICD-10-CM

## 2017-10-21 DIAGNOSIS — I10 Essential (primary) hypertension: Secondary | ICD-10-CM

## 2017-10-21 DIAGNOSIS — Z720 Tobacco use: Secondary | ICD-10-CM

## 2017-10-21 DIAGNOSIS — I255 Ischemic cardiomyopathy: Secondary | ICD-10-CM

## 2017-10-22 ENCOUNTER — Other Ambulatory Visit: Payer: Self-pay

## 2017-10-22 MED ORDER — LEVOTHYROXINE SODIUM 112 MCG PO TABS: ORAL_TABLET | ORAL | 3 refills | Status: DC

## 2017-10-27 DIAGNOSIS — G894 Chronic pain syndrome: Secondary | ICD-10-CM | POA: Diagnosis not present

## 2017-10-27 DIAGNOSIS — G609 Hereditary and idiopathic neuropathy, unspecified: Secondary | ICD-10-CM | POA: Diagnosis not present

## 2017-10-27 DIAGNOSIS — M792 Neuralgia and neuritis, unspecified: Secondary | ICD-10-CM | POA: Diagnosis not present

## 2017-10-27 DIAGNOSIS — F4542 Pain disorder with related psychological factors: Secondary | ICD-10-CM | POA: Diagnosis not present

## 2017-10-27 LAB — CUP PACEART REMOTE DEVICE CHECK
Battery Remaining Longevity: 25 mo
Battery Remaining Percentage: 34 %
Brady Statistic AP VP Percent: 9.3 %
Brady Statistic RA Percent Paced: 7.9 %
Date Time Interrogation Session: 20190718123546
HIGH POWER IMPEDANCE MEASURED VALUE: 39 Ohm
HIGH POWER IMPEDANCE MEASURED VALUE: 39 Ohm
Implantable Lead Implant Date: 20090429
Implantable Lead Implant Date: 20090429
Implantable Lead Location: 753858
Implantable Lead Location: 753860
Implantable Pulse Generator Implant Date: 20150211
Lead Channel Impedance Value: 400 Ohm
Lead Channel Pacing Threshold Amplitude: 0.75 V
Lead Channel Pacing Threshold Amplitude: 1.25 V
Lead Channel Pacing Threshold Pulse Width: 0.5 ms
Lead Channel Pacing Threshold Pulse Width: 0.5 ms
Lead Channel Pacing Threshold Pulse Width: 1 ms
Lead Channel Setting Pacing Amplitude: 1.5 V
Lead Channel Setting Pacing Amplitude: 2 V
Lead Channel Setting Pacing Pulse Width: 0.5 ms
Lead Channel Setting Pacing Pulse Width: 1 ms
Lead Channel Setting Sensing Sensitivity: 0.5 mV
MDC IDC LEAD IMPLANT DT: 20090429
MDC IDC LEAD LOCATION: 753859
MDC IDC MSMT BATTERY VOLTAGE: 2.89 V
MDC IDC MSMT LEADCHNL LV IMPEDANCE VALUE: 450 Ohm
MDC IDC MSMT LEADCHNL RA IMPEDANCE VALUE: 280 Ohm
MDC IDC MSMT LEADCHNL RA PACING THRESHOLD AMPLITUDE: 0.75 V
MDC IDC MSMT LEADCHNL RA SENSING INTR AMPL: 1.6 mV
MDC IDC MSMT LEADCHNL RV SENSING INTR AMPL: 12 mV
MDC IDC PG SERIAL: 7170199
MDC IDC SET LEADCHNL RV PACING AMPLITUDE: 2.5 V
MDC IDC STAT BRADY AP VS PERCENT: 1 %
MDC IDC STAT BRADY AS VP PERCENT: 86 %
MDC IDC STAT BRADY AS VS PERCENT: 1.9 %

## 2017-10-28 ENCOUNTER — Other Ambulatory Visit: Payer: Self-pay | Admitting: Cardiology

## 2017-10-28 DIAGNOSIS — G44099 Other trigeminal autonomic cephalgias (TAC), not intractable: Secondary | ICD-10-CM | POA: Diagnosis not present

## 2017-10-28 NOTE — Telephone Encounter (Signed)
Pt's pharmacy is requesting a refill on carbamazepine. Would [name] like to refill this medication? Please address

## 2017-10-29 ENCOUNTER — Other Ambulatory Visit: Payer: Self-pay | Admitting: Cardiology

## 2017-11-03 ENCOUNTER — Other Ambulatory Visit (INDEPENDENT_AMBULATORY_CARE_PROVIDER_SITE_OTHER): Payer: Medicare Other

## 2017-11-03 ENCOUNTER — Other Ambulatory Visit: Payer: Self-pay

## 2017-11-03 DIAGNOSIS — E890 Postprocedural hypothyroidism: Secondary | ICD-10-CM

## 2017-11-03 LAB — T4, FREE: Free T4: 1.17 ng/dL (ref 0.60–1.60)

## 2017-11-03 LAB — TSH: TSH: 0.03 u[IU]/mL — AB (ref 0.35–4.50)

## 2017-11-05 ENCOUNTER — Ambulatory Visit: Payer: Medicare Other | Admitting: Endocrinology

## 2017-11-06 ENCOUNTER — Encounter (HOSPITAL_COMMUNITY): Payer: Self-pay | Admitting: Emergency Medicine

## 2017-11-06 ENCOUNTER — Other Ambulatory Visit: Payer: Self-pay

## 2017-11-06 ENCOUNTER — Emergency Department (HOSPITAL_COMMUNITY)
Admission: EM | Admit: 2017-11-06 | Discharge: 2017-11-06 | Disposition: A | Discharge status: Home / Self Care | Payer: Medicare Other | Attending: Emergency Medicine | Admitting: Emergency Medicine

## 2017-11-06 ENCOUNTER — Emergency Department (HOSPITAL_COMMUNITY): Payer: Medicare Other

## 2017-11-06 DIAGNOSIS — Z95810 Presence of automatic (implantable) cardiac defibrillator: Secondary | ICD-10-CM | POA: Diagnosis not present

## 2017-11-06 DIAGNOSIS — I2510 Atherosclerotic heart disease of native coronary artery without angina pectoris: Secondary | ICD-10-CM | POA: Insufficient documentation

## 2017-11-06 DIAGNOSIS — Z7982 Long term (current) use of aspirin: Secondary | ICD-10-CM | POA: Diagnosis not present

## 2017-11-06 DIAGNOSIS — Z79899 Other long term (current) drug therapy: Secondary | ICD-10-CM | POA: Insufficient documentation

## 2017-11-06 DIAGNOSIS — I5022 Chronic systolic (congestive) heart failure: Secondary | ICD-10-CM | POA: Insufficient documentation

## 2017-11-06 DIAGNOSIS — F17210 Nicotine dependence, cigarettes, uncomplicated: Secondary | ICD-10-CM | POA: Diagnosis not present

## 2017-11-06 DIAGNOSIS — M25561 Pain in right knee: Secondary | ICD-10-CM | POA: Diagnosis not present

## 2017-11-06 DIAGNOSIS — G8929 Other chronic pain: Secondary | ICD-10-CM | POA: Diagnosis not present

## 2017-11-06 DIAGNOSIS — J449 Chronic obstructive pulmonary disease, unspecified: Secondary | ICD-10-CM | POA: Diagnosis not present

## 2017-11-06 DIAGNOSIS — I110 Hypertensive heart disease with heart failure: Secondary | ICD-10-CM | POA: Diagnosis not present

## 2017-11-06 MED ORDER — OXYCODONE-ACETAMINOPHEN 5-325 MG PO TABS
1.0000 | ORAL_TABLET | Freq: Once | ORAL | Status: AC
  Administered 2017-11-06: 1 via ORAL
  Filled 2017-11-06: qty 1

## 2017-11-06 MED ORDER — OXYCODONE-ACETAMINOPHEN 5-325 MG PO TABS: 1.0000 | ORAL_TABLET | Freq: Once | ORAL | Status: DC

## 2017-11-06 MED ORDER — ACETAMINOPHEN 325 MG PO TABS
650.0000 mg | ORAL_TABLET | Freq: Once | ORAL | Status: AC
  Administered 2017-11-06: 650 mg via ORAL
  Filled 2017-11-06: qty 2

## 2017-11-06 NOTE — ED Provider Notes (Signed)
Patient placed in Quick Look pathway, seen and evaluated   Chief Complaint: right knee pain  HPI:  72 y.o. female with history of knee replacement who presents with right knee pain over the last several days.  Patient reports increased pain of her right knee that is worse with ambulation as well as movement.  She notes that nothing makes her symptoms better.  She denies any fever, decreased range of motion, numbness/tingling/weakness.  ROS:  Positive ROS: (+) Right knee pain Negative ROS: (-) Fever, decreased range of motion, numbness or weakness  Physical Exam:   Gen: No distress  Neuro: Awake and Alert  Skin: Warm.  No overlying erythema or heat MSK: Right knee: Noted scar from prior knee replacement.  No overlying erythema, heat, fluctuance.  There is mild to moderate joint effusion.  Patient with diffuse tenderness.  Patient has intact range of motion of 180 degrees to approximately 80 degrees.  No TTP of hips or ankles. Compartments soft.  DP and PT pulses intact.  Extremities warm neurovascularly intact distally to site of injury.   BP 116/73 (BP Location: Left Arm)   Pulse 64   Temp 98 F (36.7 C) (Oral)   Resp 18   Ht 5\' 2"  (1.575 m)   Wt 59 kg   LMP  (LMP Unknown)   SpO2 97%   BMI 23.78 kg/m   Plan:  Based on initial evaluation, labs are not indicated and radiology studies are indicated.  Patient counseled on process, plan, and necessity for staying for completing the evaluation."  Initiation of care has begun. The patient has been counseled on the process, plan, and necessity for staying for the completion/evaluation, and the remainder of the medical screening examination    Lorelle Gibbs 11/06/17 2036    Sherwood Gambler, MD 11/07/17 [phone]

## 2017-11-06 NOTE — ED Notes (Signed)
See providers notes

## 2017-11-06 NOTE — ED Provider Notes (Signed)
[facility] EMERGENCY DEPARTMENT Provider Note   CSN: [id number] Arrival date & time: 11/06/17  2025     History   Chief Complaint Chief Complaint  Patient presents with  . Knee Pain    HPI [name] is a 72 y.o. female who presents with acute on chronic right knee pain.  Past medical history significant for coronary artery disease, CHF, GERD, hyperlipidemia, chronic back and bilateral knee pain status post bilateral total total knee replacements.  She states that she has had right knee pain for months or possibly years.  It is an aching pain.  It has worsened over the past couple of days.  She is taking oxycodone for pain but ran out about 4 days ago.  She also uses Voltaren gel with mild relief.  She called her doctor's office but was told to go to the emergency department tonight.  She denies any trauma, fever, or redness of the knee.  She states that she understands that she does not have an emergency tonight but her pain was so severe she did not know what else to do.  HPI  Past Medical History:  Diagnosis Date  . Anemia   . CAD (coronary artery disease)    Prior LAD stenting with stent thrombosis occurring in a withdrawal phase of her Plavix undertaken because of trigeminal neuralgia and release surgery. It was treated medically.  . Cancer ([place])    small bladder cancer removed no  treatment  . Cataract   . CHF (congestive heart failure) ([place])   . Complication of anesthesia    massive heart attack  . GERD (gastroesophageal reflux disease)   . Hyperlipidemia   . Ischemic cardiomyopathy   . Osteoarthritis   . SOB (shortness of breath)     Patient Active Problem List   Diagnosis Date Noted  . Biventricular ICD (implantable cardioverter-defibrillator) in place 10/12/2013  . Essential hypertension 10/12/2013  . Lumbar post-laminectomy syndrome 05/23/2013  . 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 Excessive sleepiness 05/21/2012  . COPD (chronic  obstructive pulmonary disease) ([place]) 05/21/2012  . Osteoarthritis of both knees 08/08/2011  . Anemia 03/21/2011  . Trigeminal neuralgia 03/21/2011  . Hyperlipidemia 03/21/2011  . CAD (coronary artery disease)   . Ischemic cardiomyopathy   . Biventricular implantable cardioverter-defibrillator in situ   . Chronic systolic heart failure [facility])     Past Surgical History:  Procedure Laterality Date  . APPENDECTOMY    . BACK SURGERY     x2 back surgeries  . BI-VENTRICULAR IMPLANTABLE CARDIOVERTER DEFIBRILLATOR  (CRT-D)  2009; 05/2013   STJ CRTD generator change by Dr Caryl Comes 05/2013  . BIV PACEMAKER GENERATOR CHANGE OUT Bilateral 05/11/2013   Procedure: BIV PACEMAKER GENERATOR CHANGE OUT;  Surgeon: Deboraha Sprang, MD;  [facility] CATH LAB;  Service: Cardiovascular;  Laterality: Bilateral;  . BRAIN SURGERY     x3  . EYE SURGERY Right 05/24/12  . JOINT REPLACEMENT     thumbs and knees replacement  . TUBAL LIGATION       OB History   None      Home Medications    Prior to Admission medications   Medication Sig Start Date End Date Taking? Authorizing Provider  aspirin EC 81 MG tablet Take 1 tablet (81 mg total) by mouth daily. 04/19/15   Deboraha Sprang, MD  carbamazepine (TEGRETOL) 100 MG chewable tablet Chew 100 mg by mouth 2 (two) times daily.  10/20/15   [provider]  carvedilol (  COREG) 3.125 MG tablet TAKE ONE TABLET BY MOUTH TWICE DAILY WITH A MEAL 08/11/17   Jerline Pain, MD  clorazepate (TRANXENE) 7.5 MG tablet Take 7.5 mg by mouth at bedtime.  09/25/14   [provider]  cyclobenzaprine (FLEXERIL) 5 MG tablet Take 1 tablet (5 mg total) by mouth 3 (three) times daily as needed for muscle spasms. 06/18/16   Barnet Glasgow, NP  furosemide (LASIX) 80 MG tablet TAKE ONE TABLET BY MOUTH EVERY DAY 10/29/17   Jerline Pain, MD  gabapentin (NEURONTIN) 800 MG tablet Take 800 mg by mouth 4 (four) times daily as needed. For pain 10/23/10   [provider]  iron  polysaccharides (NIFEREX) 150 MG capsule Take 150 mg by mouth 2 (two) times daily.    [provider]  levothyroxine (SYNTHROID, LEVOTHROID) 112 MCG tablet TAKE ONE TABLET (112 mcg) BY MOUTH EVERY DAY 10/22/17   Elayne Snare, MD  mirtazapine (REMERON) 30 MG tablet Take 30 mg by mouth at bedtime. 05/28/15   [provider]  nitroGLYCERIN (NITROSTAT) 0.4 MG SL tablet Dissolve ONE TABLET UNDER THE TONGUE AS DIRECTED FOR CHEST pain 10/21/17   Richardson Dopp T, PA-C  NONFORMULARY OR COMPOUNDED ITEM Apply 1-2 g topically 4 (four) times daily. 01/03/16   Edrick Kins, DPM  Oxycodone HCl 10 MG TABS Take 1 tablet by mouth every 6 (six) hours as needed for pain. 10/02/16   [provider]  potassium chloride SA (K-DUR,KLOR-CON) 20 MEQ tablet TAKE ONE TABLET BY MOUTH DAILY 10/06/17   Jerline Pain, MD  promethazine (PHENERGAN) 25 MG tablet Take 1 tablet (25 mg total) by mouth every 6 (six) hours as needed. for nasuea 01/25/13   Prueter, Santiago Glad, PA-C  ramipril (ALTACE) 2.5 MG capsule Take 1 capsule (2.5 mg total) by mouth daily. 07/02/17   Jerline Pain, MD  simvastatin (ZOCOR) 10 MG tablet TAKE ONE TABLET (10 MG) BY MOUTH EVERY DAY 09/09/17   Jerline Pain, MD  spironolactone (ALDACTONE) 25 MG tablet Take 0.5 tablets (12.5 mg total) by mouth daily. 07/15/17   Jerline Pain, MD  valACYclovir (VALTREX) 500 MG tablet Take 1 tablet by mouth as directed. 07/20/13   [provider]  venlafaxine XR (EFFEXOR-XR) 150 MG 24 hr capsule Take 150 mg by mouth daily with breakfast.  09/25/14   [provider]  VENTOLIN HFA 108 (90 Base) MCG/ACT inhaler Inhale 2 puffs into the lungs as needed. 10/13/16   [provider]  zolpidem (AMBIEN) 10 MG tablet Take 10 mg by mouth at bedtime. For sleep 09/23/10   [provider]    Family History Family History  Problem Relation Age of Onset  . Heart disease Father     Social History Social History   Tobacco Use  . Smoking  status: Current Some Day Smoker    Packs/day: 0.50    Years: 30.00    Pack years: 15.00    Types: Cigarettes  . Smokeless tobacco: Never Used  Substance Use Topics  . Alcohol use: No  . Drug use: No     Allergies   Patient has no known allergies.   Review of Systems Review of Systems  Constitutional: Negative for fever.  Musculoskeletal: Positive for arthralgias.     Physical Exam Updated Vital Signs BP 116/73 (BP Location: Left Arm)   Pulse 64   Temp 98 F (36.7 C) (Oral)   Resp 18   Ht 5\' 2"  (1.575 m)  Wt 59 kg   LMP  (LMP Unknown)   SpO2 97%   BMI 23.78 kg/m   Physical Exam  Constitutional: She is oriented to person, place, and time. She appears well-developed and well-nourished. No distress.  HENT:  Head: Normocephalic and atraumatic.  Eyes: Pupils are equal, round, and reactive to light. Conjunctivae are normal. Right eye exhibits no discharge. Left eye exhibits no discharge. No scleral icterus.  Neck: Normal range of motion.  Cardiovascular: Normal rate.  Pulmonary/Chest: Effort normal. No respiratory distress.  Abdominal: She exhibits no distension.  Musculoskeletal:  Right knee: No obvious swelling, deformity, or warmth. Prior surgical scar from total knee replacement. No significant tenderness. No tenderness of the hip or lower leg. Decreased ROM. N/V intact.   Neurological: She is alert and oriented to person, place, and time.  Skin: Skin is warm and dry.  Psychiatric: She has a normal mood and affect. Her behavior is normal.  Nursing note and vitals reviewed.    ED Treatments / Results  Labs (all labs ordered are listed, but only abnormal results are displayed) Labs Reviewed - No data to display  EKG None  Radiology Dg Knee Complete 4 Views Right  Result Date: 11/06/2017 CLINICAL DATA:  Right-sided knee pain x3 days EXAM: RIGHT KNEE - COMPLETE 4+ VIEW COMPARISON:  None FINDINGS: Intact right knee arthroplasty without hardware failure or  loosening. No significant thinning of the prosthetic lining within the femorotibial compartment. No fracture joint effusion. No suspicious osseous lesions. No significant soft tissue swelling. IMPRESSION: Intact right knee arthroplasty without acute osseous nor significant soft tissue abnormality. Electronically Signed   By: Ashley Royalty M.D.   On: 11/06/2017 21:06    Procedures Procedures (including critical care time)  Medications Ordered in ED Medications  oxyCODONE-acetaminophen (PERCOCET/ROXICET) 5-325 MG per tablet 1 tablet (has no administration in time range)  acetaminophen (TYLENOL) tablet 650 mg (650 mg Oral Given 11/06/17 2225)     Initial Impression / Assessment and Plan / ED Course  I have reviewed the triage vital signs and the nursing notes.  Pertinent labs & imaging results that were available during my care of the patient were reviewed by me and considered in my medical decision making (see chart for details).  72 year old female with acute on chronic knee pain.  Her vital signs are normal.  She has no trauma to the knee.  Exam is not consistent with septic joint.  X-ray is negative.  She was given pain control and advised to follow-up with her primary care provider.  Final Clinical Impressions(s) / ED Diagnoses   Final diagnoses:  Chronic pain of right knee    ED Discharge Orders    None       Recardo Evangelist, PA-C 11/06/17 2253    Merrily Pew, MD 11/07/17 [phone]

## 2017-11-06 NOTE — ED Triage Notes (Signed)
Pt reports right knee pain that started over a week ago, no numbness or tingling.  No noted injuries.  Good pulses, and strengths.

## 2017-11-06 NOTE — Discharge Instructions (Signed)
Please rest and ice the knee. Apply voltaren gel Follow up with your doctor

## 2017-11-16 ENCOUNTER — Ambulatory Visit (INDEPENDENT_AMBULATORY_CARE_PROVIDER_SITE_OTHER): Payer: Medicare Other

## 2017-11-16 ENCOUNTER — Telehealth: Payer: Self-pay

## 2017-11-16 DIAGNOSIS — I5022 Chronic systolic (congestive) heart failure: Secondary | ICD-10-CM | POA: Diagnosis not present

## 2017-11-16 DIAGNOSIS — G500 Trigeminal neuralgia: Secondary | ICD-10-CM | POA: Diagnosis not present

## 2017-11-16 DIAGNOSIS — M79606 Pain in leg, unspecified: Secondary | ICD-10-CM | POA: Diagnosis not present

## 2017-11-16 DIAGNOSIS — Z79891 Long term (current) use of opiate analgesic: Secondary | ICD-10-CM | POA: Diagnosis not present

## 2017-11-16 DIAGNOSIS — Z95810 Presence of automatic (implantable) cardiac defibrillator: Secondary | ICD-10-CM

## 2017-11-16 DIAGNOSIS — G894 Chronic pain syndrome: Secondary | ICD-10-CM | POA: Diagnosis not present

## 2017-11-16 DIAGNOSIS — M961 Postlaminectomy syndrome, not elsewhere classified: Secondary | ICD-10-CM | POA: Diagnosis not present

## 2017-11-16 DIAGNOSIS — Z79899 Other long term (current) drug therapy: Secondary | ICD-10-CM | POA: Diagnosis not present

## 2017-11-16 NOTE — Telephone Encounter (Signed)
LMOVM reminding pt to send remote transmission.   

## 2017-11-17 NOTE — Progress Notes (Signed)
EPIC Encounter for ICM Monitoring  Patient Name: [name] is a 72 y.o. female Date: 11/17/2017 Primary Care Physican: Shirline Frees, MD Primary Cardiologist:Skains Electrophysiologist: Caryl Comes Dry Weight:160 lbs Bi-V Pacing: 95%       Heart Failure questions reviewed, pt said feet are very swollen.   Advised will send copy of report to Dr Marlou Porch since she has an appointment on Thursday, 8/22   Thoracic impedance normal but was abnormal suggesting fluid accumulation from 10/23/2017 - 11/11/2017.  Prescribed dosage: Furosemide 80 mg 1 tablet daily.  Recommendations: No changes.    Encouraged to call for fluid symptoms.  Follow-up plan: ICM clinic phone appointment on 12/24/2017.    Office appointment with Dr Marlou Porch 11/19/2017  Copy of ICM check sent to Dr. Caryl Comes.   3 month ICM trend: 11/17/2017    1 Year ICM trend:       [name], RN 11/17/2017 9:24 AM

## 2017-11-19 ENCOUNTER — Ambulatory Visit: Payer: Medicare Other | Admitting: Cardiology

## 2017-11-19 NOTE — Progress Notes (Deleted)
[facility]. [address], [address] [place], [place]  [address] Phone: [phone] Fax:  [phone]  Date:  11/19/2017   ID:  [name], DOB 04/05/1945, MRN [id number]  PCP:  Shirline Frees, MD   History of Present Illness: [name] is a 73 y.o. female with ischemic cardiomyopathy ejection fraction in the 15-20% range, Ischemic cardiomyopathy status post Bi-V pacemaker, coronary artery disease with bare-metal stent implantation for in-stent restenosis LAD.  She underwent a nuclear stress test  which was high risk, EF 19%, large area of infarct.  She has had 2 episodes of chest discomfort that was quite severe and took 2 nitroglycerin. This subsided. She's no longer having any discomfort.  Dr. Caryl Comes  provided her with AV optimization of her device. She was told that her device at baseline was fairly synchronized.  She is battled with long-standing depression.    In 2012, we had a long discussion about her cardiomyopathy and overall prognosis.   10/28/16  - Blood pressure is quite low again today. In the 80s. She has had this in the past. She's not feeling any chest pain. No significant shortness of breath. Over the last week or 2 she has noticed some increased swelling in her left lower extremity. She states that this is unusual for her. No pain.   05/13/17  -Overall has been having some shortness of breath.  Could be smoking/lung related versus cardiomyopathy related.  We have asked her to double up her Lasix over the next 3 days.  No chest pain, no syncope, no bleeding, no orthopnea.  11/19/2017- ICM monitoring was reviewed and thoracic impedance was normal but there was some suggestion of fluid accumulation from 7/26 to 11/11/2017.  Furosemide 80 mg once a day continued.    Wt Readings from Last 3 Encounters:  11/06/17 130 lb (59 kg)  05/13/17 161 lb 6.4 oz (73.2 kg)  10/28/16 165 lb 9.6 oz (75.1 kg)     Past Medical History:  Diagnosis Date  . Anemia   . CAD  (coronary artery disease)    Prior LAD stenting with stent thrombosis occurring in a withdrawal phase of her Plavix undertaken because of trigeminal neuralgia and release surgery. It was treated medically.  . Cancer ([place])    small bladder cancer removed no  treatment  . Cataract   . CHF (congestive heart failure) ([place])   . Complication of anesthesia    massive heart attack  . GERD (gastroesophageal reflux disease)   . Hyperlipidemia   . Ischemic cardiomyopathy   . Osteoarthritis   . SOB (shortness of breath)     Past Surgical History:  Procedure Laterality Date  . APPENDECTOMY    . BACK SURGERY     x2 back surgeries  . BI-VENTRICULAR IMPLANTABLE CARDIOVERTER DEFIBRILLATOR  (CRT-D)  2009; 05/2013   STJ CRTD generator change by Dr Caryl Comes 05/2013  . BIV PACEMAKER GENERATOR CHANGE OUT Bilateral 05/11/2013   Procedure: BIV PACEMAKER GENERATOR CHANGE OUT;  Surgeon: Deboraha Sprang, MD;  [facility] C[facility] LAB;  Service: Cardiovascular;  Laterality: Bilateral;  . BRAIN SURGERY     x3  . EYE SURGERY Right 05/24/12  . JOINT REPLACEMENT     thumbs and knees replacement  . TUBAL LIGATION      Current Outpatient Medications  Medication Sig Dispense Refill  . aspirin EC 81 MG tablet Take 1 tablet (81 mg total) by mouth daily.    . carbamazepine (TEGRETOL) 100  MG chewable tablet Chew 100 mg by mouth 2 (two) times daily.   2  . carvedilol (COREG) 3.125 MG tablet TAKE ONE TABLET BY MOUTH TWICE DAILY WITH A MEAL 60 tablet 7  . clorazepate (TRANXENE) 7.5 MG tablet Take 7.5 mg by mouth at bedtime.   4  . cyclobenzaprine (FLEXERIL) 5 MG tablet Take 1 tablet (5 mg total) by mouth 3 (three) times daily as needed for muscle spasms. 30 tablet 0  . furosemide (LASIX) 80 MG tablet TAKE ONE TABLET BY MOUTH EVERY DAY 30 tablet 5  . gabapentin (NEURONTIN) 800 MG tablet Take 800 mg by mouth 4 (four) times daily as needed. For pain    . iron polysaccharides (NIFEREX) 150 MG capsule Take 150 mg by mouth 2 (two)  times daily.    Marland Kitchen levothyroxine (SYNTHROID, LEVOTHROID) 112 MCG tablet TAKE ONE TABLET (112 mcg) BY MOUTH EVERY DAY 30 tablet 3  . mirtazapine (REMERON) 30 MG tablet Take 30 mg by mouth at bedtime.  5  . nitroGLYCERIN (NITROSTAT) 0.4 MG SL tablet Dissolve ONE TABLET UNDER THE TONGUE AS DIRECTED FOR CHEST pain 25 tablet 4  . NONFORMULARY OR COMPOUNDED ITEM Apply 1-2 g topically 4 (four) times daily. 120 each 2  . Oxycodone HCl 10 MG TABS Take 1 tablet by mouth every 6 (six) hours as needed for pain.  0  . potassium chloride SA (K-DUR,KLOR-CON) 20 MEQ tablet TAKE ONE TABLET BY MOUTH DAILY 30 tablet 8  . promethazine (PHENERGAN) 25 MG tablet Take 1 tablet (25 mg total) by mouth every 6 (six) hours as needed. for nasuea 90 tablet 1  . ramipril (ALTACE) 2.5 MG capsule Take 1 capsule (2.5 mg total) by mouth daily. 90 capsule 3  . simvastatin (ZOCOR) 10 MG tablet TAKE ONE TABLET (10 MG) BY MOUTH EVERY DAY 90 tablet 2  . spironolactone (ALDACTONE) 25 MG tablet Take 0.5 tablets (12.5 mg total) by mouth daily. 45 tablet 3  . valACYclovir (VALTREX) 500 MG tablet Take 1 tablet by mouth as directed.    . venlafaxine XR (EFFEXOR-XR) 150 MG 24 hr capsule Take 150 mg by mouth daily with breakfast.   4  . VENTOLIN HFA 108 (90 Base) MCG/ACT inhaler Inhale 2 puffs into the lungs as needed.  4  . zolpidem (AMBIEN) 10 MG tablet Take 10 mg by mouth at bedtime. For sleep     Current Facility-Administered Medications  Medication Dose Route Frequency Provider Last Rate Last Dose  . betamethasone acetate-betamethasone sodium phosphate (CELESTONE) injection 3 mg  3 mg Intramuscular Once Edrick Kins, DPM        Allergies:   No Known Allergies  Social History:  The patient  reports that she has been smoking cigarettes. She has a 15.00 pack-year smoking history. She has never used smokeless tobacco. She reports that she does not drink alcohol or use drugs.   ROS:  Please see the history of present illness.  All other  review of systems negative.     PHYSICAL EXAM: VS:  LMP  (LMP Unknown)  GEN: Thin, in no acute distress HEENT: normal Neck: no JVD, carotid bruits, or masses Cardiac: ***RRR; no murmurs, rubs, or gallops, +pedal edema  Respiratory:  clear to auscultation bilaterally, normal work of breathing GI: soft, nontender, nondistended, + BS MS: no deformity or atrophy Skin: warm and dry, no rash Neuro:  Alert and Oriented x 3, Strength and sensation are intact Psych: euthymic mood, full affect  EKG:  EKG  performed today 04/29/16-ventricular pacing, biventricular pacing. Heart rate 74 bpm. 04/26/14 - Ventricular pacing, sinus rhythm, 87.     NUC stress 04/19/15:  Nuclear stress EF: 19%.  This is a high risk study.  Findings consistent with prior myocardial infarction.  The left ventricular ejection fraction is severely decreased (<30%).  High risk stress nuclear study with large, severe, fixed anterior, septal, apical and distal inferior defect consistent with prior infarct; no ischemia; EF 19 with akinesis of the anteroseptal wall and apex; severe LVE; study high risk due to reduced LV function.  ASSESSMENT AND PLAN:  Chronic systolic heart failure-ated. Ejection fraction of 19% range.  NYHA class II to III symptoms. BP previously in the 80s. Still warn her about risks of driving because of her medications and risks to others on the road. She would not be a candidate for Entresto based upon BP. She states that she is not having any trigeminal neuralgia pain.  -For the next 3 days I would like for her to take additional Lasix 80 mg twice a day.  I think in some respects, her lung condition, heavy smoking, probable COPD is playing a role.  Biventricular ICD-fairly well synchronized according to review of Dr. Olin Pia note.  Doing well.  Left lower extremity edema- negative DVT in the past, she calls this her "heart leg." There seems to be increased swelling in the her left greater than right  extremity. Lasix  Hypotension-she is not a candidate for increased cardiomyopathy therapy.  She understands the consequences of potential crash.  Tobacco use-encouraged cessation. Albuterol.    6 month follow up or sooner.  Signed, Candee Furbish, MD [facility]  11/19/2017 8:38 AM

## 2017-11-24 ENCOUNTER — Encounter: Payer: Self-pay | Admitting: Cardiology

## 2017-11-24 ENCOUNTER — Ambulatory Visit: Payer: Medicare Other | Admitting: Cardiology

## 2017-11-24 VITALS — BP 112/68 | HR 82 | Ht 62.0 in | Wt 160.0 lb

## 2017-11-24 DIAGNOSIS — Z95810 Presence of automatic (implantable) cardiac defibrillator: Secondary | ICD-10-CM | POA: Diagnosis not present

## 2017-11-24 DIAGNOSIS — G500 Trigeminal neuralgia: Secondary | ICD-10-CM | POA: Diagnosis not present

## 2017-11-24 DIAGNOSIS — I5022 Chronic systolic (congestive) heart failure: Secondary | ICD-10-CM

## 2017-11-24 NOTE — Progress Notes (Signed)
[facility] [address] [place], [place]  [address] Phone: [phone] Fax:  [phone]  Date:  11/24/2017   ID:  [name], DOB [birth year], MRN [id number]  PCP:  Shirline Frees, MD   History of Present Illness: [name] is a 72 y.o. female with ischemic cardiomyopathy ejection fraction in the 15-20% range, Ischemic cardiomyopathy status post Bi-V pacemaker, coronary artery disease with bare-metal stent implantation for in-stent restenosis LAD.  She underwent a nuclear stress test  which was high risk, EF 19%, large area of infarct.  She has had 2 episodes of chest discomfort that was quite severe and took 2 nitroglycerin. This subsided. She's no longer having any discomfort.  Dr. Caryl Comes  provided her with AV optimization of her device. She was told that her device at baseline was fairly synchronized.  She is battled with long-standing depression.    In 2012, we had a long discussion about her cardiomyopathy and overall prognosis.   10/28/16  - Blood pressure is quite low again today. In the 80s. She has had this in the past. She's not feeling any chest pain. No significant shortness of breath. Over the last week or 2 she has noticed some increased swelling in her left lower extremity. She states that this is unusual for her. No pain.   05/13/17  -Overall has been having some shortness of breath.  Could be smoking/lung related versus cardiomyopathy related.  We have asked her to double up her Lasix over the next 3 days.  No chest pain, no syncope, no bleeding, no orthopnea.  11/24/2017- ICM monitoring was reviewed and thoracic impedance was normal but there was some suggestion of fluid accumulation from 7/26 to 11/11/2017.  Furosemide 80 mg once a day continued. Facial pain, right Weak and short of breath.  No chest pain.  She is having trouble because she dismissed herself from her pain clinic.  She showed me the letter.  She has not contacted Dr.  Kenton Kingfisher.    Wt Readings from Last 3 Encounters:  11/24/17 160 lb (72.6 kg)  11/06/17 130 lb (59 kg)  05/13/17 161 lb 6.4 oz (73.2 kg)     Past Medical History:  Diagnosis Date  . Anemia   . CAD (coronary artery disease)    Prior LAD stenting with stent thrombosis occurring in a withdrawal phase of her Plavix undertaken because of trigeminal neuralgia and release surgery. It was treated medically.  . Cancer ([place])    small bladder cancer removed no  treatment  . Cataract   . CHF (congestive heart failure) ([place])   . Complication of anesthesia    massive heart attack  . GERD (gastroesophageal reflux disease)   . Hyperlipidemia   . Ischemic cardiomyopathy   . Osteoarthritis   . SOB (shortness of breath)     Past Surgical History:  Procedure Laterality Date  . APPENDECTOMY    . BACK SURGERY     x2 back surgeries  . BI-VENTRICULAR IMPLANTABLE CARDIOVERTER DEFIBRILLATOR  (CRT-D)  2009; 05/2013   STJ CRTD generator change by Dr Caryl Comes 05/2013  . BIV PACEMAKER GENERATOR CHANGE OUT Bilateral 05/11/2013   Procedure: BIV PACEMAKER GENERATOR CHANGE OUT;  Surgeon: Deboraha Sprang, MD;  Location: [facility] CATH LAB;  Service: Cardiovascular;  Laterality: Bilateral;  . BRAIN SURGERY     x3  . EYE SURGERY Right 05/24/12  . JOINT REPLACEMENT     thumbs and knees replacement  . TUBAL LIGATION  Current Outpatient Medications  Medication Sig Dispense Refill  . aspirin EC 81 MG tablet Take 1 tablet (81 mg total) by mouth daily.    . carbamazepine (TEGRETOL) 100 MG chewable tablet Chew 100 mg by mouth 2 (two) times daily.   2  . carvedilol (COREG) 3.125 MG tablet TAKE ONE TABLET BY MOUTH TWICE DAILY WITH A MEAL 60 tablet 7  . clorazepate (TRANXENE) 7.5 MG tablet Take 7.5 mg by mouth at bedtime.   4  . cyclobenzaprine (FLEXERIL) 5 MG tablet Take 1 tablet (5 mg total) by mouth 3 (three) times daily as needed for muscle spasms. 30 tablet 0  . furosemide (LASIX) 80 MG tablet TAKE ONE TABLET BY MOUTH  EVERY DAY 30 tablet 5  . gabapentin (NEURONTIN) 800 MG tablet Take 800 mg by mouth 4 (four) times daily as needed. For pain    . iron polysaccharides (NIFEREX) 150 MG capsule Take 150 mg by mouth 2 (two) times daily.    Marland Kitchen levothyroxine (SYNTHROID, LEVOTHROID) 112 MCG tablet TAKE ONE TABLET (112 mcg) BY MOUTH EVERY DAY 30 tablet 3  . mirtazapine (REMERON) 30 MG tablet Take 30 mg by mouth at bedtime.  5  . nitroGLYCERIN (NITROSTAT) 0.4 MG SL tablet Dissolve ONE TABLET UNDER THE TONGUE AS DIRECTED FOR CHEST pain 25 tablet 4  . NONFORMULARY OR COMPOUNDED ITEM Apply 1-2 g topically 4 (four) times daily. 120 each 2  . Oxycodone HCl 10 MG TABS Take 1 tablet by mouth every 6 (six) hours as needed for pain.  0  . potassium chloride SA (K-DUR,KLOR-CON) 20 MEQ tablet TAKE ONE TABLET BY MOUTH DAILY 30 tablet 8  . ramipril (ALTACE) 2.5 MG capsule Take 1 capsule (2.5 mg total) by mouth daily. 90 capsule 3  . simvastatin (ZOCOR) 10 MG tablet TAKE ONE TABLET (10 MG) BY MOUTH EVERY DAY 90 tablet 2  . spironolactone (ALDACTONE) 25 MG tablet Take 0.5 tablets (12.5 mg total) by mouth daily. 45 tablet 3  . valACYclovir (VALTREX) 500 MG tablet Take 1 tablet by mouth as directed.    . venlafaxine XR (EFFEXOR-XR) 150 MG 24 hr capsule Take 150 mg by mouth daily with breakfast.   4  . VENTOLIN HFA 108 (90 Base) MCG/ACT inhaler Inhale 2 puffs into the lungs as needed.  4  . zolpidem (AMBIEN) 10 MG tablet Take 10 mg by mouth at bedtime. For sleep     Current Facility-Administered Medications  Medication Dose Route Frequency Provider Last Rate Last Dose  . betamethasone acetate-betamethasone sodium phosphate (CELESTONE) injection 3 mg  3 mg Intramuscular Once Edrick Kins, DPM        Allergies:   No Known Allergies  Social History:  The patient  reports that she has been smoking cigarettes. She has a 15.00 pack-year smoking history. She has never used smokeless tobacco. She reports that she does not drink alcohol or  use drugs.   ROS:  Please see the history of present illness.  All other review of systems negative.     PHYSICAL EXAM: VS:  BP 112/68   Pulse 82   Ht 5\' 2"  (1.575 m)   Wt 160 lb (72.6 kg)   LMP  (LMP Unknown)   SpO2 95%   BMI 29.26 kg/m  GEN: Seems to be in pain. Thin, in no acute distress  HEENT: normal  Neck: no JVD, carotid bruits, or masses Cardiac: RRR; no murmurs, rubs, or gallops, +pedal edema  Respiratory:  clear to auscultation  bilaterally, normal work of breathing GI: soft, nontender, nondistended, + BS MS: no deformity or atrophy  Skin: warm and dry, no rash Neuro:  Alert and Oriented x 3, Strength and sensation are intact Psych: euthymic mood, full affect  EKG:  EKG performed today 04/29/16-ventricular pacing, biventricular pacing. Heart rate 74 bpm. 04/26/14 - Ventricular pacing, sinus rhythm, 87.     NUC stress 04/19/15:  Nuclear stress EF: 19%.  This is a high risk study.  Findings consistent with prior myocardial infarction.  The left ventricular ejection fraction is severely decreased (<30%).  High risk stress nuclear study with large, severe, fixed anterior, septal, apical and distal inferior defect consistent with prior infarct; no ischemia; EF 19 with akinesis of the anteroseptal wall and apex; severe LVE; study high risk due to reduced LV function.  ASSESSMENT AND PLAN:  Chronic systolic heart failure-ated. Ejection fraction of 19% range.  NYHA class II to III symptoms. BP previously in the 80s. Still warn her about risks of driving because of her medications and risks to others on the road. She would not be a candidate for Entresto based upon BP.  Overall no significant changes.  Volume seems to be stabilized.  No changes made.    Biventricular ICD-fairly well synchronized according to review of Dr. Olin Pia note.  Doing well.  Left lower extremity edema- negative DVT in the past, she calls this her "heart leg." Seems to be quite stable.  Continue  with Lasix.  Hypotension-she is not a candidate for increased cardiomyopathy therapy.  She understands the consequences of potential crash.  No changes made.  Tobacco use-encouraged cessation. Albuterol.  Trigeminal neuralgia, chronic pain- unfortunately she dismissed herself from her chronic pain clinic.    6 month follow up or sooner.  Signed, Candee Furbish, MD [facility]  11/24/2017 2:30 PM

## 2017-11-24 NOTE — Patient Instructions (Signed)

## 2017-11-25 DIAGNOSIS — G500 Trigeminal neuralgia: Secondary | ICD-10-CM | POA: Diagnosis not present

## 2017-11-25 DIAGNOSIS — F5101 Primary insomnia: Secondary | ICD-10-CM | POA: Diagnosis not present

## 2017-11-25 DIAGNOSIS — G894 Chronic pain syndrome: Secondary | ICD-10-CM | POA: Diagnosis not present

## 2017-11-25 DIAGNOSIS — E7800 Pure hypercholesterolemia, unspecified: Secondary | ICD-10-CM | POA: Diagnosis not present

## 2017-11-26 ENCOUNTER — Encounter: Payer: Self-pay | Admitting: Endocrinology

## 2017-11-26 ENCOUNTER — Ambulatory Visit: Payer: Medicare Other | Admitting: Endocrinology

## 2017-11-26 VITALS — BP 118/64 | HR 70 | Ht 62.0 in | Wt 157.0 lb

## 2017-11-26 DIAGNOSIS — E890 Postprocedural hypothyroidism: Secondary | ICD-10-CM | POA: Diagnosis not present

## 2017-11-26 DIAGNOSIS — Z23 Encounter for immunization: Secondary | ICD-10-CM | POA: Diagnosis not present

## 2017-11-26 MED ORDER — LEVOTHYROXINE SODIUM 100 MCG PO TABS: 100.0000 ug | ORAL_TABLET | Freq: Every day | ORAL | 3 refills | Status: DC

## 2017-11-26 NOTE — Progress Notes (Signed)
Patient ID: [name], female   DOB: Aug 20, 1945, 72 y.o.   MRN: [id number]    Reason for Appointment:  Post ablative hypothyroidism, followup  History of Present Illness:    She became hypothyroid in 03/2012 following radioactive iodine treatment for hyperthyroidism in 11/13  She was initially started with 75 mcg of levothyroxine but with her TSH being nearly 50 in 3/14 the dose was increased to 137 mcg Subsequently has required lower doses   Her thyroid level was quite normal in 6/18 but she has not been back for her six-month follow-up until today  She has no new complaints such as palpitations, jitteriness or shakiness No new complaints of any fatigue, she tends to have some mild chronic fatigue No recent weight loss She thinks she has been taking her levothyroxine consistently as before in the morning  TSH is now suppressed with relatively higher free T4 which is still normal   Wt Readings from Last 3 Encounters:  11/26/17 157 lb (71.2 kg)  11/24/17 160 lb (72.6 kg)  11/06/17 130 lb (59 kg)   Labs:  Lab Results  Component Value Date   TSH 0.03 (L) 11/03/2017   TSH 1.16 09/25/2016   TSH 2.85 03/28/2016   FREET4 1.17 11/03/2017   FREET4 0.70 09/25/2016   FREET4 0.76 03/28/2016       Allergies as of 11/26/2017   No Known Allergies     Medication List        Accurate as of 11/26/17 12:05 PM. Always use your most recent med list.          aspirin EC 81 MG tablet Take 1 tablet (81 mg total) by mouth daily.   carbamazepine 100 MG chewable tablet Commonly known as:  TEGRETOL Chew 100 mg by mouth 2 (two) times daily.   carvedilol 3.125 MG tablet Commonly known as:  COREG TAKE ONE TABLET BY MOUTH TWICE DAILY WITH A MEAL   clorazepate 7.5 MG tablet Commonly known as:  TRANXENE Take 7.5 mg by mouth at bedtime.   cyclobenzaprine 5 MG tablet Commonly known as:  FLEXERIL Take 1 tablet (5 mg total) by mouth 3 (three) times daily as needed for  muscle spasms.   furosemide 80 MG tablet Commonly known as:  LASIX TAKE ONE TABLET BY MOUTH EVERY DAY   gabapentin 800 MG tablet Commonly known as:  NEURONTIN Take 800 mg by mouth 4 (four) times daily as needed. For pain   iron polysaccharides 150 MG capsule Commonly known as:  NIFEREX Take 150 mg by mouth 2 (two) times daily.   levothyroxine 100 MCG tablet Commonly known as:  SYNTHROID, LEVOTHROID Take 1 tablet (100 mcg total) by mouth daily.   mirtazapine 30 MG tablet Commonly known as:  REMERON Take 30 mg by mouth at bedtime.   nitroGLYCERIN 0.4 MG SL tablet Commonly known as:  NITROSTAT Dissolve ONE TABLET UNDER THE TONGUE AS DIRECTED FOR CHEST pain   NONFORMULARY OR COMPOUNDED ITEM Apply 1-2 g topically 4 (four) times daily.   Oxycodone HCl 10 MG Tabs Take 1 tablet by mouth every 6 (six) hours as needed for pain.   potassium chloride SA 20 MEQ tablet Commonly known as:  K-DUR,KLOR-CON TAKE ONE TABLET BY MOUTH DAILY   ramipril 2.5 MG capsule Commonly known as:  ALTACE Take 1 capsule (2.5 mg total) by mouth daily.   simvastatin 10 MG tablet Commonly known as:  ZOCOR TAKE ONE TABLET (10 MG) BY MOUTH EVERY DAY  spironolactone 25 MG tablet Commonly known as:  ALDACTONE Take 0.5 tablets (12.5 mg total) by mouth daily.   valACYclovir 500 MG tablet Commonly known as:  VALTREX Take 1 tablet by mouth as directed.   venlafaxine XR 150 MG 24 hr capsule Commonly known as:  EFFEXOR-XR Take 150 mg by mouth daily with breakfast.   VENTOLIN HFA 108 (90 Base) MCG/ACT inhaler Generic drug:  albuterol Inhale 2 puffs into the lungs as needed.   zolpidem 10 MG tablet Commonly known as:  AMBIEN Take 10 mg by mouth at bedtime. For sleep           Past Medical History:  Diagnosis Date  . Anemia   . CAD (coronary artery disease)    Prior LAD stenting with stent thrombosis occurring in a withdrawal phase of her Plavix undertaken because of trigeminal neuralgia and  release surgery. It was treated medically.  . Cancer ([place])    small bladder cancer removed no  treatment  . Cataract   . CHF (congestive heart failure) ([place])   . Complication of anesthesia    massive heart attack  . GERD (gastroesophageal reflux disease)   . Hyperlipidemia   . Ischemic cardiomyopathy   . Osteoarthritis   . SOB (shortness of breath)     Past Surgical History:  Procedure Laterality Date  . APPENDECTOMY    . BACK SURGERY     x2 back surgeries  . BI-VENTRICULAR IMPLANTABLE CARDIOVERTER DEFIBRILLATOR  (CRT-D)  2009; 05/2013   STJ CRTD generator change by Dr Caryl Comes 05/2013  . BIV PACEMAKER GENERATOR CHANGE OUT Bilateral 05/11/2013   Procedure: BIV PACEMAKER GENERATOR CHANGE OUT;  Surgeon: Deboraha Sprang, MD;  Location: [facility] CATH LAB;  Service: Cardiovascular;  Laterality: Bilateral;  . BRAIN SURGERY     x3  . EYE SURGERY Right 05/24/12  . JOINT REPLACEMENT     thumbs and knees replacement  . TUBAL LIGATION      Family History  Problem Relation Age of Onset  . Heart disease Father     Social History:  reports that she has been smoking cigarettes. She has a 15.00 pack-year smoking history. She has never used smokeless tobacco. She reports that she does not drink alcohol or use drugs.  Allergies: No Known Allergies  Review of Systems:  CARDIOLOGY: she has history of coronary artery disease with multiple events and interventions  History of trigeminal neuralgia   She has not made any efforts to quit smoking recently        Examination:   BP 118/64   Pulse 70   Ht 5\' 2"  (1.575 m)   Wt 157 lb (71.2 kg)   LMP  (LMP Unknown)   SpO2 95%   BMI 28.72 kg/m   Alert, not anxious No tremor Biceps reflexes show normal relaxation Heart rate regular     Assessment   Post ablative hypothyroidism:   She is currently on a regimen of 112 g for some time Although previously she had been told to take 6-1/2 tablets a week she is taking 1 tablet daily Her last visit  was 1 year ago Although she is subjectively doing well her TSH is significantly suppressed now Discussed need for keeping her thyroid levels normal especially with her history of cardiac disease  She will reduce her dose to 100 mcg daily but needs to be followed more regularly, will schedule 2 month follow-up  High-dose influenza vaccine given  Elayne Snare 11/26/2017, 12:05 PM

## 2017-12-11 ENCOUNTER — Emergency Department (HOSPITAL_COMMUNITY)
Admission: EM | Admit: 2017-12-11 | Discharge: 2017-12-11 | Discharge status: Absent without leave | Payer: Medicare Other

## 2017-12-24 ENCOUNTER — Telehealth (HOSPITAL_COMMUNITY): Payer: Self-pay | Admitting: *Deleted

## 2017-12-24 ENCOUNTER — Ambulatory Visit (INDEPENDENT_AMBULATORY_CARE_PROVIDER_SITE_OTHER): Payer: Medicare Other

## 2017-12-24 DIAGNOSIS — Z95810 Presence of automatic (implantable) cardiac defibrillator: Secondary | ICD-10-CM

## 2017-12-24 DIAGNOSIS — R251 Tremor, unspecified: Secondary | ICD-10-CM | POA: Diagnosis not present

## 2017-12-24 DIAGNOSIS — I5022 Chronic systolic (congestive) heart failure: Secondary | ICD-10-CM

## 2017-12-24 DIAGNOSIS — G44091 Other trigeminal autonomic cephalgias (TAC), intractable: Secondary | ICD-10-CM | POA: Diagnosis not present

## 2017-12-24 NOTE — Telephone Encounter (Signed)
Prior authorization for Venlafaxine received. Chart reviewed, patient not seen in outpatient department. Called to notify pharmacy.

## 2017-12-25 DIAGNOSIS — G44091 Other trigeminal autonomic cephalgias (TAC), intractable: Secondary | ICD-10-CM | POA: Diagnosis not present

## 2017-12-25 NOTE — Progress Notes (Signed)
EPIC Encounter for ICM Monitoring  Patient Name: [name] is a 72 y.o. female Date: 12/25/2017 Primary Care Physican: Shirline Frees, MD Primary Cardiologist:[facility] Electrophysiologist: Caryl Comes Dry Weight:Previous weight 160 lbs Bi-V Pacing: 95%       Attempted call to patient and unable to reach.  Left message with person answering phone to return call.  Transmission reviewed.    Thoracic impedance slightly below baseline.   Prescribed: Furosemide 80 mg 1 tablet daily.  Recommendations:  Unable to reach.  Follow-up plan: ICM clinic phone appointment on 01/25/2018.       Copy of ICM check sent to Dr. Caryl Comes.   3 month ICM trend: 12/23/2017    1 Year ICM trend:       [name], RN 12/25/2017 8:58 AM

## 2017-12-25 NOTE — Progress Notes (Signed)
Patient returned call.  She reported no change in condition.  Feet are always swollen and no worse than usual.  Advised to keep feet elevated when sitting or lying down.  Advised to limit salt intake daily.  No changes today. Encouraged to call for any worsening of swelling.  Weight stable at 160 lbs

## 2018-01-12 ENCOUNTER — Other Ambulatory Visit (INDEPENDENT_AMBULATORY_CARE_PROVIDER_SITE_OTHER): Payer: BC Managed Care – PPO

## 2018-01-12 DIAGNOSIS — E890 Postprocedural hypothyroidism: Secondary | ICD-10-CM

## 2018-01-12 LAB — TSH: TSH: 0.31 u[IU]/mL — ABNORMAL LOW (ref 0.35–4.50)

## 2018-01-12 LAB — T4, FREE: FREE T4: 0.99 ng/dL (ref 0.60–1.60)

## 2018-01-14 ENCOUNTER — Ambulatory Visit (INDEPENDENT_AMBULATORY_CARE_PROVIDER_SITE_OTHER): Payer: Medicare Other | Admitting: Endocrinology

## 2018-01-14 ENCOUNTER — Encounter: Payer: Self-pay | Admitting: Endocrinology

## 2018-01-14 ENCOUNTER — Encounter: Payer: Medicare Other | Admitting: *Deleted

## 2018-01-14 ENCOUNTER — Ambulatory Visit: Payer: Medicare Other | Admitting: Endocrinology

## 2018-01-14 ENCOUNTER — Telehealth: Payer: Self-pay | Admitting: Cardiology

## 2018-01-14 VITALS — BP 118/82 | HR 75 | Ht 61.0 in | Wt 158.8 lb

## 2018-01-14 DIAGNOSIS — E890 Postprocedural hypothyroidism: Secondary | ICD-10-CM

## 2018-01-14 DIAGNOSIS — I255 Ischemic cardiomyopathy: Secondary | ICD-10-CM

## 2018-01-14 NOTE — Patient Instructions (Signed)
Fridays take 1/2 pill of thyroid and 1 on other days

## 2018-01-14 NOTE — Progress Notes (Signed)
Patient ID: [name], female   DOB: 1946-03-24, 72 y.o.   MRN: [id number]    Reason for Appointment:  Post ablative hypothyroidism, followup  History of Present Illness:    She became hypothyroid in 03/2012 following radioactive iodine treatment for hyperthyroidism in 11/13  She was initially started with 75 mcg of levothyroxine but with her TSH being nearly 50 in 3/14 the dose was increased to 137 mcg Subsequently has required lower doses   She has usually no change in her physical symptoms with variable thyroid levels or dosage changes However some of this may be because of her difficulty with remembering symptoms  She had not followed up for over a year when she was seen in 8/19 and her TSH was suppressed at 0.03 with relatively higher free T4 At that time she had no new complaints such as palpitations, jitteriness or shakiness Her dosage at that time was 112 mcg  With reducing her dose down to 100 mcg levothyroxine her TSH is almost normal Again she does not think she has any more fatigue and her husband does not think she has had any new physical symptoms or changes Weight is stable  According to her husband has been taking her levothyroxine consistently before breakfast in the morning    TSH is now only slightly suppressed with relatively better free T4 which is still normal   Wt Readings from Last 3 Encounters:  01/14/18 158 lb 12.8 oz (72 kg)  11/26/17 157 lb (71.2 kg)  11/24/17 160 lb (72.6 kg)   Labs:  Lab Results  Component Value Date   TSH 0.31 (L) 01/12/2018   TSH 0.03 (L) 11/03/2017   TSH 1.16 09/25/2016   FREET4 0.99 01/12/2018   FREET4 1.17 11/03/2017   FREET4 0.70 09/25/2016       Allergies as of 01/14/2018   No Known Allergies     Medication List        Accurate as of 01/14/18  7:51 PM. Always use your most recent med list.          aspirin EC 81 MG tablet Take 1 tablet (81 mg total) by mouth daily.   carbamazepine  100 MG chewable tablet Commonly known as:  TEGRETOL Chew 100 mg by mouth 2 (two) times daily.   carvedilol 3.125 MG tablet Commonly known as:  COREG TAKE ONE TABLET BY MOUTH TWICE DAILY WITH A MEAL   clorazepate 7.5 MG tablet Commonly known as:  TRANXENE Take 7.5 mg by mouth at bedtime.   cyclobenzaprine 5 MG tablet Commonly known as:  FLEXERIL Take 1 tablet (5 mg total) by mouth 3 (three) times daily as needed for muscle spasms.   furosemide 80 MG tablet Commonly known as:  LASIX TAKE ONE TABLET BY MOUTH EVERY DAY   gabapentin 800 MG tablet Commonly known as:  NEURONTIN Take 800 mg by mouth 4 (four) times daily as needed. For pain   iron polysaccharides 150 MG capsule Commonly known as:  NIFEREX Take 150 mg by mouth 2 (two) times daily.   levothyroxine 100 MCG tablet Commonly known as:  SYNTHROID, LEVOTHROID Take 1 tablet (100 mcg total) by mouth daily.   mirtazapine 30 MG tablet Commonly known as:  REMERON Take 30 mg by mouth at bedtime.   nitroGLYCERIN 0.4 MG SL tablet Commonly known as:  NITROSTAT Dissolve ONE TABLET UNDER THE TONGUE AS DIRECTED FOR CHEST pain   NONFORMULARY OR COMPOUNDED ITEM Apply 1-2 g topically 4 (four)  times daily.   Oxycodone HCl 10 MG Tabs Take 1 tablet by mouth every 6 (six) hours as needed for pain.   potassium chloride SA 20 MEQ tablet Commonly known as:  K-DUR,KLOR-CON TAKE ONE TABLET BY MOUTH DAILY   ramipril 2.5 MG capsule Commonly known as:  ALTACE Take 1 capsule (2.5 mg total) by mouth daily.   simvastatin 10 MG tablet Commonly known as:  ZOCOR TAKE ONE TABLET (10 MG) BY MOUTH EVERY DAY   spironolactone 25 MG tablet Commonly known as:  ALDACTONE Take 0.5 tablets (12.5 mg total) by mouth daily.   valACYclovir 500 MG tablet Commonly known as:  VALTREX Take 1 tablet by mouth as directed.   venlafaxine XR 150 MG 24 hr capsule Commonly known as:  EFFEXOR-XR Take 150 mg by mouth daily with breakfast.   VENTOLIN HFA  108 (90 Base) MCG/ACT inhaler Generic drug:  albuterol Inhale 2 puffs into the lungs as needed.   zolpidem 10 MG tablet Commonly known as:  AMBIEN Take 10 mg by mouth at bedtime. For sleep           Past Medical History:  Diagnosis Date  . Anemia   . CAD (coronary artery disease)    Prior LAD stenting with stent thrombosis occurring in a withdrawal phase of her Plavix undertaken because of trigeminal neuralgia and release surgery. It was treated medically.  . Cancer ([place])    small bladder cancer removed no  treatment  . Cataract   . CHF (congestive heart failure) ([place])   . Complication of anesthesia    massive heart attack  . GERD (gastroesophageal reflux disease)   . Hyperlipidemia   . Ischemic cardiomyopathy   . Osteoarthritis   . SOB (shortness of breath)     Past Surgical History:  Procedure Laterality Date  . APPENDECTOMY    . BACK SURGERY     x2 back surgeries  . BI-VENTRICULAR IMPLANTABLE CARDIOVERTER DEFIBRILLATOR  (CRT-D)  2009; 05/2013   STJ CRTD generator change by Dr Caryl Comes 05/2013  . BIV PACEMAKER GENERATOR CHANGE OUT Bilateral 05/11/2013   Procedure: BIV PACEMAKER GENERATOR CHANGE OUT;  Surgeon: Deboraha Sprang, MD;  [facility];  Service: Cardiovascular;  Laterality: Bilateral;  . BRAIN SURGERY     x3  . EYE SURGERY Right 05/24/12  . JOINT REPLACEMENT     thumbs and knees replacement  . TUBAL LIGATION      Family History  Problem Relation Age of Onset  . Heart disease Father     Social History:  reports that she has been smoking cigarettes. She has a 15.00 pack-year smoking history. She has never used smokeless tobacco. She reports that she does not drink alcohol or use drugs.  Allergies: No Known Allergies  Review of Systems:  CARDIOLOGY: she has history of coronary artery disease with multiple events and interventions  History of trigeminal neuralgia          Examination:   BP 118/82   Pulse 75   Ht 5\' 1"  (1.549 m)   Wt 158 lb  12.8 oz (72 kg)   LMP  (LMP Unknown)   SpO2 94%   BMI 30.00 kg/m   She looks well  Biceps reflexes show normal relaxation No pedal edema     Assessment   Post ablative hypothyroidism:   She has required lower doses of supplementation for her thyroid recently Now with taking 100 mcg levothyroxine her TSH is not as suppressed at 0.31  However considering her age and cardiac history would like to reduce her dose is slightly again  She will take 6-1/2 tablets a week Her husband was present today and he will arrange her medication dispensers accordingly  Follow-up in 3 months  Elayne Snare 01/14/2018, 7:51 PM

## 2018-01-14 NOTE — Telephone Encounter (Signed)
LMOVM reminding pt to send remote transmission.   

## 2018-01-15 ENCOUNTER — Encounter: Payer: Self-pay | Admitting: Cardiology

## 2018-01-22 ENCOUNTER — Telehealth: Payer: Self-pay

## 2018-01-22 NOTE — Telephone Encounter (Signed)
Attempted to confirm remote transmission with pt. No answer and was unable to leave a message.  Pt monitor has not updated within 7 days.  

## 2018-01-29 ENCOUNTER — Telehealth: Payer: Self-pay | Admitting: Cardiology

## 2018-01-29 NOTE — Telephone Encounter (Signed)
LMOVM requesting that pt send manual transmission b/c home monitor has not updated in at least 7 days.    

## 2018-02-08 ENCOUNTER — Ambulatory Visit (INDEPENDENT_AMBULATORY_CARE_PROVIDER_SITE_OTHER): Payer: Medicare Other | Admitting: *Deleted

## 2018-02-08 ENCOUNTER — Telehealth: Payer: Self-pay | Admitting: Cardiology

## 2018-02-08 DIAGNOSIS — I255 Ischemic cardiomyopathy: Secondary | ICD-10-CM

## 2018-02-08 NOTE — Telephone Encounter (Signed)
Spoke w/ pt and requested that she send a manual transmission b/c her home monitor has not updated in at least 14 days.   

## 2018-02-09 ENCOUNTER — Ambulatory Visit (INDEPENDENT_AMBULATORY_CARE_PROVIDER_SITE_OTHER): Payer: Medicare Other

## 2018-02-09 DIAGNOSIS — Z95810 Presence of automatic (implantable) cardiac defibrillator: Secondary | ICD-10-CM | POA: Diagnosis not present

## 2018-02-09 DIAGNOSIS — I5022 Chronic systolic (congestive) heart failure: Secondary | ICD-10-CM | POA: Diagnosis not present

## 2018-02-09 NOTE — Progress Notes (Signed)
EPIC Encounter for ICM Monitoring  Patient Name: [name] is a 72 y.o. female Date: 02/09/2018 Primary Care Physican: Shirline Frees, MD Primary Cardiologist:[facility] Electrophysiologist: Caryl Comes Dry Weight:Previous weight 160 lbs Bi-V Pacing: 94%        Heart Failure questions reviewed, pt asymptomatic.   Thoracic impedance normal but was abnormal suggesting fluid accumulation from 01/07/2018 - 01/23/2018.   Prescribed: Furosemide 80 mg 1 tablet daily.  Recommendations: No changes.    Encouraged to call for fluid symptoms.  Follow-up plan: ICM clinic phone appointment on 03/11/2018.     Copy of ICM check sent to Dr. Caryl Comes.   3 month ICM trend: 02/08/2018    1 Year ICM trend:       [name], RN 02/09/2018 3:00 PM

## 2018-02-10 NOTE — Progress Notes (Signed)
Remote ICD transmission.   

## 2018-03-11 ENCOUNTER — Ambulatory Visit (INDEPENDENT_AMBULATORY_CARE_PROVIDER_SITE_OTHER): Payer: Medicare Other

## 2018-03-11 DIAGNOSIS — I5022 Chronic systolic (congestive) heart failure: Secondary | ICD-10-CM | POA: Diagnosis not present

## 2018-03-11 DIAGNOSIS — Z95810 Presence of automatic (implantable) cardiac defibrillator: Secondary | ICD-10-CM

## 2018-03-12 NOTE — Progress Notes (Addendum)
EPIC Encounter for ICM Monitoring  Patient Name: [name] is a 72 y.o. female Date: 03/12/2018 Primary Care Physican: Shirline Frees, MD Primary Cardiologist:[facility]ains Electrophysiologist: Vergie Living Pacing: 94% Last Weight:160 lbs                                                   Transmission reviewed.   Thoracic impedance normal but was abnormal suggesting fluid accumulation from 11/269/2019 through 03/08/2018.   Prescribed: Furosemide 80 mg 1 tablet daily.  Recommendations: None  Follow-up plan: ICM clinic phone appointment on 04/19/2018.     Copy of ICM check sent to Dr. Caryl Comes.   3 month ICM trend: 03/11/2018    1 Year ICM trend:       [name], RN 03/12/2018 10:16 AM

## 2018-04-11 LAB — CUP PACEART REMOTE DEVICE CHECK
Battery Remaining Longevity: 23 mo
Battery Remaining Percentage: 29 %
Brady Statistic AS VS Percent: 2.2 %
HIGH POWER IMPEDANCE MEASURED VALUE: 41 Ohm
HIGH POWER IMPEDANCE MEASURED VALUE: 42 Ohm
Implantable Lead Implant Date: 20090429
Implantable Lead Implant Date: 20090429
Implantable Lead Location: 753858
Implantable Lead Location: 753860
Implantable Pulse Generator Implant Date: 20150211
Lead Channel Impedance Value: 440 Ohm
Lead Channel Pacing Threshold Amplitude: 0.75 V
Lead Channel Pacing Threshold Amplitude: 0.75 V
Lead Channel Pacing Threshold Amplitude: 1.25 V
Lead Channel Pacing Threshold Pulse Width: 0.5 ms
Lead Channel Pacing Threshold Pulse Width: 0.5 ms
Lead Channel Pacing Threshold Pulse Width: 1 ms
Lead Channel Sensing Intrinsic Amplitude: 1.8 mV
Lead Channel Setting Pacing Amplitude: 1.5 V
Lead Channel Setting Pacing Amplitude: 2 V
Lead Channel Setting Pacing Amplitude: 2.5 V
Lead Channel Setting Pacing Pulse Width: 0.5 ms
Lead Channel Setting Pacing Pulse Width: 1 ms
MDC IDC LEAD IMPLANT DT: 20090429
MDC IDC LEAD LOCATION: 753859
MDC IDC MSMT BATTERY VOLTAGE: 2.89 V
MDC IDC MSMT LEADCHNL LV IMPEDANCE VALUE: 480 Ohm
MDC IDC MSMT LEADCHNL RA IMPEDANCE VALUE: 290 Ohm
MDC IDC MSMT LEADCHNL RV SENSING INTR AMPL: 12 mV
MDC IDC PG SERIAL: 7170199
MDC IDC SESS DTM: 20191111202606
MDC IDC SET LEADCHNL RV SENSING SENSITIVITY: 0.5 mV
MDC IDC STAT BRADY AP VP PERCENT: 13 %
MDC IDC STAT BRADY AP VS PERCENT: 1 %
MDC IDC STAT BRADY AS VP PERCENT: 82 %
MDC IDC STAT BRADY RA PERCENT PACED: 11 %

## 2018-04-16 ENCOUNTER — Other Ambulatory Visit: Payer: Medicare Other

## 2018-04-19 ENCOUNTER — Ambulatory Visit: Payer: Medicare Other | Admitting: Endocrinology

## 2018-04-19 NOTE — Progress Notes (Signed)
No ICM remote transmission received for 04/19/2018 and next ICM transmission scheduled for 04/29/2018.

## 2018-04-28 DIAGNOSIS — M25532 Pain in left wrist: Secondary | ICD-10-CM | POA: Diagnosis not present

## 2018-04-29 ENCOUNTER — Ambulatory Visit (INDEPENDENT_AMBULATORY_CARE_PROVIDER_SITE_OTHER): Payer: Medicare Other

## 2018-04-29 DIAGNOSIS — I5022 Chronic systolic (congestive) heart failure: Secondary | ICD-10-CM | POA: Diagnosis not present

## 2018-04-29 DIAGNOSIS — Z95810 Presence of automatic (implantable) cardiac defibrillator: Secondary | ICD-10-CM | POA: Diagnosis not present

## 2018-04-30 ENCOUNTER — Telehealth: Payer: Self-pay

## 2018-04-30 NOTE — Telephone Encounter (Signed)
Remote ICM transmission received.  Attempted call to patient regarding ICM remote transmission and caregiver stated she was not home.

## 2018-04-30 NOTE — Progress Notes (Signed)
EPIC Encounter for ICM Monitoring  Patient Name: [name] is a 73 y.o. female Date: 04/30/2018 Primary Care Physican: Shirline Frees, MD Primary Cardiologist:Skains Electrophysiologist: Vergie Living Pacing: 94% Last Weight:160 lbs   Attempted call to patient and unable to reach.   Transmission reviewed.   Thoracic impedance normalbut was abnormalsuggesting fluid accumulation from 04/19/2018 through 04/28/2018.   Prescribed:Furosemide 80 mg 1 tablet daily.  Recommendations:Unable to reach.  Follow-up plan: ICM clinic phone appointment on3/05/2018.   Copy of ICM check sent to [place].   3 month ICM trend: 04/29/2018    1 Year ICM trend:       [name], RN 04/30/2018 8:57 AM

## 2018-05-17 DIAGNOSIS — F339 Major depressive disorder, recurrent, unspecified: Secondary | ICD-10-CM | POA: Diagnosis not present

## 2018-05-28 ENCOUNTER — Encounter (HOSPITAL_COMMUNITY): Payer: Self-pay | Admitting: Emergency Medicine

## 2018-05-28 ENCOUNTER — Encounter: Payer: Self-pay | Admitting: Cardiology

## 2018-05-28 ENCOUNTER — Emergency Department (HOSPITAL_COMMUNITY): Payer: Medicare Other

## 2018-05-28 ENCOUNTER — Other Ambulatory Visit: Payer: Self-pay

## 2018-05-28 ENCOUNTER — Emergency Department (HOSPITAL_COMMUNITY)
Admission: EM | Admit: 2018-05-28 | Discharge: 2018-05-28 | Disposition: A | Discharge status: Home / Self Care | Payer: Medicare Other | Attending: Emergency Medicine | Admitting: Emergency Medicine

## 2018-05-28 DIAGNOSIS — Y939 Activity, unspecified: Secondary | ICD-10-CM | POA: Insufficient documentation

## 2018-05-28 DIAGNOSIS — W01198A Fall on same level from slipping, tripping and stumbling with subsequent striking against other object, initial encounter: Secondary | ICD-10-CM | POA: Diagnosis not present

## 2018-05-28 DIAGNOSIS — S0240DA Maxillary fracture, left side, initial encounter for closed fracture: Secondary | ICD-10-CM | POA: Diagnosis not present

## 2018-05-28 DIAGNOSIS — Z7982 Long term (current) use of aspirin: Secondary | ICD-10-CM | POA: Insufficient documentation

## 2018-05-28 DIAGNOSIS — Z96659 Presence of unspecified artificial knee joint: Secondary | ICD-10-CM | POA: Diagnosis not present

## 2018-05-28 DIAGNOSIS — J449 Chronic obstructive pulmonary disease, unspecified: Secondary | ICD-10-CM | POA: Diagnosis not present

## 2018-05-28 DIAGNOSIS — S0285XA Fracture of orbit, unspecified, initial encounter for closed fracture: Secondary | ICD-10-CM | POA: Diagnosis not present

## 2018-05-28 DIAGNOSIS — I110 Hypertensive heart disease with heart failure: Secondary | ICD-10-CM | POA: Insufficient documentation

## 2018-05-28 DIAGNOSIS — I5022 Chronic systolic (congestive) heart failure: Secondary | ICD-10-CM | POA: Diagnosis not present

## 2018-05-28 DIAGNOSIS — S299XXA Unspecified injury of thorax, initial encounter: Secondary | ICD-10-CM | POA: Diagnosis not present

## 2018-05-28 DIAGNOSIS — S0232XA Fracture of orbital floor, left side, initial encounter for closed fracture: Secondary | ICD-10-CM | POA: Diagnosis not present

## 2018-05-28 DIAGNOSIS — Y999 Unspecified external cause status: Secondary | ICD-10-CM | POA: Insufficient documentation

## 2018-05-28 DIAGNOSIS — S02401A Maxillary fracture, unspecified, initial encounter for closed fracture: Secondary | ICD-10-CM | POA: Insufficient documentation

## 2018-05-28 DIAGNOSIS — Z950 Presence of cardiac pacemaker: Secondary | ICD-10-CM | POA: Insufficient documentation

## 2018-05-28 DIAGNOSIS — R0789 Other chest pain: Secondary | ICD-10-CM | POA: Diagnosis not present

## 2018-05-28 DIAGNOSIS — S0231XA Fracture of orbital floor, right side, initial encounter for closed fracture: Secondary | ICD-10-CM | POA: Diagnosis not present

## 2018-05-28 DIAGNOSIS — F17210 Nicotine dependence, cigarettes, uncomplicated: Secondary | ICD-10-CM | POA: Diagnosis not present

## 2018-05-28 DIAGNOSIS — Y929 Unspecified place or not applicable: Secondary | ICD-10-CM | POA: Insufficient documentation

## 2018-05-28 DIAGNOSIS — I2510 Atherosclerotic heart disease of native coronary artery without angina pectoris: Secondary | ICD-10-CM | POA: Insufficient documentation

## 2018-05-28 DIAGNOSIS — Z79899 Other long term (current) drug therapy: Secondary | ICD-10-CM | POA: Diagnosis not present

## 2018-05-28 DIAGNOSIS — S0292XA Unspecified fracture of facial bones, initial encounter for closed fracture: Secondary | ICD-10-CM | POA: Diagnosis not present

## 2018-05-28 DIAGNOSIS — S0990XA Unspecified injury of head, initial encounter: Secondary | ICD-10-CM | POA: Diagnosis not present

## 2018-05-28 DIAGNOSIS — S02842A Fracture of lateral orbital wall, left side, initial encounter for closed fracture: Secondary | ICD-10-CM | POA: Diagnosis not present

## 2018-05-28 MED ORDER — AMOXICILLIN-POT CLAVULANATE 875-125 MG PO TABS: 1.0000 | ORAL_TABLET | Freq: Two times a day (BID) | ORAL | 0 refills | Status: DC

## 2018-05-28 MED ORDER — ACETAMINOPHEN 500 MG PO TABS
1000.0000 mg | ORAL_TABLET | Freq: Once | ORAL | Status: AC
  Administered 2018-05-28: 1000 mg via ORAL
  Filled 2018-05-28: qty 2

## 2018-05-28 MED ORDER — AMOXICILLIN-POT CLAVULANATE 875-125 MG PO TABS
1.0000 | ORAL_TABLET | Freq: Once | ORAL | Status: AC
  Administered 2018-05-28: 1 via ORAL
  Filled 2018-05-28: qty 1

## 2018-05-28 MED ORDER — OXYCODONE-ACETAMINOPHEN 5-325 MG PO TABS: 1.0000 | ORAL_TABLET | Freq: Four times a day (QID) | ORAL | 0 refills | Status: DC | PRN

## 2018-05-28 NOTE — ED Triage Notes (Addendum)
Patient reports fall at approximately 1800, hitting face on cement. Denies LOC. Denies taking blood thinners. Bruising under left eye.  Patient also reports hitting chest during fall. C/o pain to left chest at site of defibrillator.

## 2018-05-28 NOTE — Discharge Instructions (Signed)
1.  Your CT scans did not show any bleeding around the brain.  You do however have nondisplaced broken bones in the face.  You have a broken bone around the eye.  You also have a broken bone in your sinus.  All of these are in aligned position.  You should not need surgery for repair.  It is however important that you follow-up with an ophthalmologist to monitor your healing process. 2.  Take Augmentin as prescribed for your sinus fracture.  Take Tylenol as needed for pain.  You may take a dose of Percocet instead of Tylenol if you need stronger pain control.  Make sure someone is with you at all times to continue checking on you.  Sleep with your head elevated about 30 degrees to help with swelling.  You may apply a well wrapped ice pack to your eye but make sure there is no pressure on it.  Do not rub your eye and do not blow your nose. 3.  Return to the emergency department if you are having confusion, vomiting, problems with your vision or other concerning symptoms.

## 2018-05-28 NOTE — ED Provider Notes (Signed)
[facility] DEPT Provider Note   CSN: [id number] Arrival date & time: 05/28/18  2110    History   Chief Complaint Chief Complaint  Patient presents with  . Fall    HPI [name] is a 73 y.o. female.     HPI Patient had a mechanical fall at about 6 PM.  She reports she did not get knocked out.  She fell on concrete.  She reports she did however start to get a lot of swelling and purple discoloration around her left eye.  She also reports that she had a quite a bit of pain in her face and around her eye but took some oxycodone at home from a leftover prescription and has had significant improvement.  She denies any problems with her vision.  No double vision no blurred vision no decreased vision.  No nausea no vomiting.  No neck pain.  No confusion no incoordination. Past Medical History:  Diagnosis Date  . Anemia   . CAD (coronary artery disease)    Prior LAD stenting with stent thrombosis occurring in a withdrawal phase of her Plavix undertaken because of trigeminal neuralgia and release surgery. It was treated medically.  . Cancer ([place])    small bladder cancer removed no  treatment  . Cataract   . CHF (congestive heart failure) ([place])   . Complication of anesthesia    massive heart attack  . GERD (gastroesophageal reflux disease)   . Hyperlipidemia   . Ischemic cardiomyopathy   . Osteoarthritis   . SOB (shortness of breath)     Patient Active Problem List   Diagnosis Date Noted  . Biventricular ICD (implantable cardioverter-defibrillator) in place 10/12/2013  . Essential hypertension 10/12/2013  . Lumbar post-laminectomy syndrome 05/23/2013  . 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 Excessive sleepiness 05/21/2012  . COPD (chronic obstructive pulmonary disease) ([place]) 05/21/2012  . Osteoarthritis of both knees 08/08/2011  . Anemia 03/21/2011  . Trigeminal neuralgia 03/21/2011  . Hyperlipidemia 03/21/2011  . CAD (coronary artery  disease)   . Ischemic cardiomyopathy   . Biventricular implantable cardioverter-defibrillator in situ   . Chronic systolic heart failure [facility])     Past Surgical History:  Procedure Laterality Date  . APPENDECTOMY    . BACK SURGERY     x2 back surgeries  . BI-VENTRICULAR IMPLANTABLE CARDIOVERTER DEFIBRILLATOR  (CRT-D)  2009; 05/2013   STJ CRTD generator change by Dr Caryl Comes 05/2013  . BIV PACEMAKER GENERATOR CHANGE OUT Bilateral 05/11/2013   Procedure: BIV PACEMAKER GENERATOR CHANGE OUT;  Surgeon: Deboraha Sprang, MD;  Location: [facility] [address] CATH LAB;  Service: Cardiovascular;  Laterality: Bilateral;  . BRAIN SURGERY     x3  . EYE SURGERY Right 05/24/12  . JOINT REPLACEMENT     thumbs and knees replacement  . TUBAL LIGATION       OB History   No obstetric history on file.      Home Medications    Prior to Admission medications   Medication Sig Start Date End Date Taking? Authorizing Provider  aspirin EC 81 MG tablet Take 1 tablet (81 mg total) by mouth daily. 04/19/15  Yes Deboraha Sprang, MD  carbamazepine (TEGRETOL) 100 MG chewable tablet Chew 100 mg by mouth 2 (two) times daily.  10/20/15  Yes [provider]  carvedilol (COREG) 3.125 MG tablet TAKE ONE TABLET BY MOUTH TWICE DAILY WITH A MEAL 08/11/17  Yes Jerline Pain, MD  clorazepate (TRANXENE) 3.75 MG tablet Take 3.75 mg  by mouth at bedtime.  05/25/18  Yes [provider]  furosemide (LASIX) 80 MG tablet TAKE ONE TABLET BY MOUTH EVERY DAY 10/29/17  Yes Jerline Pain, MD  gabapentin (NEURONTIN) 800 MG tablet Take 800 mg by mouth 3 (three) times daily. For pain 10/23/10  Yes [provider]  iron polysaccharides (NIFEREX) 150 MG capsule Take 150 mg by mouth daily.    Yes [provider]  levothyroxine (SYNTHROID, LEVOTHROID) 112 MCG tablet Take 56-112 mcg by mouth as directed. Take 1 tablet (112 mcg) daily except for Fridays Take 0.5 tablet (56 mcg) 12/22/17  Yes [provider]  LORazepam (ATIVAN)  0.5 MG tablet Take 0.5 mg by mouth daily.  05/17/18  Yes [provider]  mirtazapine (REMERON) 45 MG tablet Take 45 mg by mouth at bedtime. 05/17/18  Yes [provider]  NONFORMULARY OR COMPOUNDED ITEM Apply 1-2 g topically 4 (four) times daily. 01/03/16  Yes Edrick Kins, DPM  Oxycodone HCl 10 MG TABS Take 1 tablet by mouth every 6 (six) hours as needed for pain. 10/02/16  Yes [provider]  potassium chloride SA (K-DUR,KLOR-CON) 20 MEQ tablet TAKE ONE TABLET BY MOUTH DAILY 10/06/17  Yes Jerline Pain, MD  ramipril (ALTACE) 2.5 MG capsule Take 1 capsule (2.5 mg total) by mouth daily. 07/02/17  Yes Jerline Pain, MD  simvastatin (ZOCOR) 10 MG tablet TAKE ONE TABLET (10 MG) BY MOUTH EVERY DAY Patient taking differently: Take 10 mg by mouth daily.  09/09/17  Yes Jerline Pain, MD  spironolactone (ALDACTONE) 25 MG tablet Take 0.5 tablets (12.5 mg total) by mouth daily. 07/15/17  Yes Jerline Pain, MD  venlafaxine XR (EFFEXOR-XR) 150 MG 24 hr capsule Take 150 mg by mouth daily with breakfast.  09/25/14  Yes [provider]  VENTOLIN HFA 108 (90 Base) MCG/ACT inhaler Inhale 2 puffs into the lungs as needed for wheezing or shortness of breath.  10/13/16  Yes [provider]  zolpidem (AMBIEN) 10 MG tablet Take 10 mg by mouth at bedtime. For sleep 09/23/10  Yes [provider]  amoxicillin-clavulanate (AUGMENTIN) 875-125 MG tablet Take 1 tablet by mouth 2 (two) times daily. One po bid x 7 days 05/28/18   Charlesetta Shanks, MD  cyclobenzaprine (FLEXERIL) 5 MG tablet Take 1 tablet (5 mg total) by mouth 3 (three) times daily as needed for muscle spasms. Patient not taking: Reported on 05/28/2018 06/18/16   Barnet Glasgow, NP  levothyroxine (SYNTHROID, LEVOTHROID) 100 MCG tablet Take 1 tablet (100 mcg total) by mouth daily. Patient not taking: Reported on 05/28/2018 11/26/17   Elayne Snare, MD  nitroGLYCERIN (NITROSTAT) 0.4 MG SL tablet Dissolve ONE TABLET UNDER THE  TONGUE AS DIRECTED FOR CHEST pain Patient taking differently: 0.4 mg every 5 (five) minutes as needed for chest pain.  10/21/17   Richardson Dopp T, PA-C  oxyCODONE-acetaminophen (PERCOCET) 5-325 MG tablet Take 1 tablet by mouth every 6 (six) hours as needed for severe pain. 05/28/18   Charlesetta Shanks, MD    Family History Family History  Problem Relation Age of Onset  . Heart disease Father     Social History Social History   Tobacco Use  . Smoking status: Current Some Day Smoker    Packs/day: 0.50    Years: 30.00    Pack years: 15.00    Types: Cigarettes  . Smokeless tobacco: Never Used  Substance Use Topics  . Alcohol use: No  . Drug use: No  Allergies   Patient has no known allergies.   Review of Systems Review of Systems 10 Systems reviewed and are negative for acute change except as noted in the HPI.   Physical Exam Updated Vital Signs BP 114/69 (BP Location: Left Arm)   Pulse 86   Temp 98.3 F (36.8 C) (Oral)   Resp 16   LMP  (LMP Unknown)   SpO2 94%   Physical Exam Constitutional:      Appearance: Normal appearance.  HENT:     Head:     Comments: Patient has periorbital ecchymosis on the right.  Eye opening is normal.  Extraocular motions are normal.  No large amount of swelling.    Nose: Nose normal.     Mouth/Throat:     Mouth: Mucous membranes are moist.     Pharynx: Oropharynx is clear.  Eyes:     Extraocular Movements: Extraocular movements intact.     Pupils: Pupils are equal, round, and reactive to light.  Neck:     Musculoskeletal: Normal range of motion and neck supple.  Cardiovascular:     Rate and Rhythm: Normal rate and regular rhythm.  Pulmonary:     Effort: Pulmonary effort is normal.     Breath sounds: Normal breath sounds.  Abdominal:     General: There is no distension.     Palpations: Abdomen is soft.     Tenderness: There is no abdominal tenderness. There is no guarding.  Musculoskeletal: Normal range of motion.         General: No swelling, tenderness or deformity.  Skin:    General: Skin is warm and dry.  Neurological:     General: No focal deficit present.     Mental Status: She is alert and oriented to person, place, and time.     Coordination: Coordination normal.  Psychiatric:        Mood and Affect: Mood normal.      ED Treatments / Results  Labs (all labs ordered are listed, but only abnormal results are displayed) Labs Reviewed - No data to display  EKG None  Radiology Dg Chest 2 View  Result Date: 05/28/2018 CLINICAL DATA:  Left chest pain at site of defibrillator after a fall. Current smoker. EXAM: CHEST - 2 VIEW COMPARISON:  01/29/2016 FINDINGS: Cardiac pacemaker. Cardiac enlargement. Prominent central pulmonary vascularity suggesting possible pulmonary arterial hypertension. No edema or consolidation in the lungs. No blunting of costophrenic angles. No pneumothorax. Degenerative changes in the spine. Postoperative changes in the lumbar spine. IMPRESSION: Cardiac enlargement. No evidence of active pulmonary disease. Electronically Signed   By: Lucienne Capers M.D.   On: 05/28/2018 22:30   Ct Head Wo Contrast  Result Date: 05/28/2018 CLINICAL DATA:  73 year old female with fall and facial trauma. EXAM: CT HEAD WITHOUT CONTRAST CT MAXILLOFACIAL WITHOUT CONTRAST TECHNIQUE: Multidetector CT imaging of the head and maxillofacial structures were performed using the standard protocol without intravenous contrast. Multiplanar CT image reconstructions of the maxillofacial structures were also generated. COMPARISON:  Head CT dated 07/28/2014 FINDINGS: CT HEAD FINDINGS Brain: There is mild age-related atrophy and chronic microvascular ischemic changes. There is no acute intracranial hemorrhage. No mass effect or midline shift. No extra-axial fluid collection. Vascular: No hyperdense vessel or unexpected calcification. Skull: No acute calvarial pathology. Right occipital craniectomy. Other: None CT  MAXILLOFACIAL FINDINGS Osseous: There is a nondisplaced fracture of the lateral wall of the left orbit as well as nondisplaced fracture of the left orbital floor. There are  fractures of the anterior and posterolateral walls of the left maxillary sinus. Orbits: The globes and retro-orbital fat are preserved. Right cataract surgery. Sinuses: There is partial opacification of the left maxillary sinus consistent with hemosinus. The remainder of the visualized paranasal sinuses and mastoid air cells are clear. Soft tissues: Left facial and periorbital hematoma. IMPRESSION: 1. No acute intracranial pathology. 2. Left facial bone fractures. Electronically Signed   By: Anner Crete M.D.   On: 05/28/2018 22:43   Ct Maxillofacial Wo Cm  Result Date: 05/28/2018 CLINICAL DATA:  73 year old female with fall and facial trauma. EXAM: CT HEAD WITHOUT CONTRAST CT MAXILLOFACIAL WITHOUT CONTRAST TECHNIQUE: Multidetector CT imaging of the head and maxillofacial structures were performed using the standard protocol without intravenous contrast. Multiplanar CT image reconstructions of the maxillofacial structures were also generated. COMPARISON:  Head CT dated 07/28/2014 FINDINGS: CT HEAD FINDINGS Brain: There is mild age-related atrophy and chronic microvascular ischemic changes. There is no acute intracranial hemorrhage. No mass effect or midline shift. No extra-axial fluid collection. Vascular: No hyperdense vessel or unexpected calcification. Skull: No acute calvarial pathology. Right occipital craniectomy. Other: None CT MAXILLOFACIAL FINDINGS Osseous: There is a nondisplaced fracture of the lateral wall of the left orbit as well as nondisplaced fracture of the left orbital floor. There are fractures of the anterior and posterolateral walls of the left maxillary sinus. Orbits: The globes and retro-orbital fat are preserved. Right cataract surgery. Sinuses: There is partial opacification of the left maxillary sinus consistent  with hemosinus. The remainder of the visualized paranasal sinuses and mastoid air cells are clear. Soft tissues: Left facial and periorbital hematoma. IMPRESSION: 1. No acute intracranial pathology. 2. Left facial bone fractures. Electronically Signed   By: Anner Crete M.D.   On: 05/28/2018 22:43    Procedures Procedures (including critical care time)  Medications Ordered in ED Medications  amoxicillin-clavulanate (AUGMENTIN) 875-125 MG per tablet 1 tablet (has no administration in time range)  acetaminophen (TYLENOL) tablet 1,000 mg (has no administration in time range)     Initial Impression / Assessment and Plan / ED Course  I have reviewed the triage vital signs and the nursing notes.  Pertinent labs & imaging results that were available during my care of the patient were reviewed by me and considered in my medical decision making (see chart for details).       Patient mechanical fall.  CT identifies nondisplaced orbital fractures and nondisplaced maxillary sinus fractures.  Patient's mental status is clear.  Neurologic exam is normal.  Patient has family members at home to assist.  She has discharged in good condition with instructions for home management.  Return precautions reviewed.  Patient advised that she does need to follow-up with ophthalmology.  Final Clinical Impressions(s) / ED Diagnoses   Final diagnoses:  Closed fracture of orbit, initial encounter  Closed fracture of maxillary sinus, initial encounter [facility])    ED Discharge Orders         Ordered    oxyCODONE-acetaminophen (PERCOCET) 5-325 MG tablet  Every 6 hours PRN     05/28/18 2321    amoxicillin-clavulanate (AUGMENTIN) 875-125 MG tablet  2 times daily     05/28/18 2321           Charlesetta Shanks, MD 05/28/18 2324

## 2018-05-28 NOTE — ED Notes (Signed)
Patient requesting assistance with putting on coat for discharge. Advised patient to wait until paperwork was ready and that we would assist her with getting changed. Patient attempting to get out of bed, both side rails are up, and patient instructed to stay in bed unless assisted.

## 2018-05-31 DIAGNOSIS — S20219A Contusion of unspecified front wall of thorax, initial encounter: Secondary | ICD-10-CM | POA: Diagnosis not present

## 2018-05-31 DIAGNOSIS — H25012 Cortical age-related cataract, left eye: Secondary | ICD-10-CM | POA: Diagnosis not present

## 2018-05-31 DIAGNOSIS — S0232XA Fracture of orbital floor, left side, initial encounter for closed fracture: Secondary | ICD-10-CM | POA: Diagnosis not present

## 2018-05-31 DIAGNOSIS — H1132 Conjunctival hemorrhage, left eye: Secondary | ICD-10-CM | POA: Diagnosis not present

## 2018-05-31 DIAGNOSIS — S02401A Maxillary fracture, unspecified, initial encounter for closed fracture: Secondary | ICD-10-CM | POA: Diagnosis not present

## 2018-06-01 ENCOUNTER — Telehealth: Payer: Self-pay

## 2018-06-01 NOTE — Telephone Encounter (Signed)
Left message for patient to remind of missed remote transmission.  

## 2018-06-09 DIAGNOSIS — G44091 Other trigeminal autonomic cephalgias (TAC), intractable: Secondary | ICD-10-CM | POA: Diagnosis not present

## 2018-06-14 ENCOUNTER — Other Ambulatory Visit: Payer: Self-pay | Admitting: Endocrinology

## 2018-06-18 NOTE — Progress Notes (Signed)
No ICM remote transmission received for 05/31/2018 and next ICM transmission scheduled for 06/28/2018.   

## 2018-06-22 DIAGNOSIS — F324 Major depressive disorder, single episode, in partial remission: Secondary | ICD-10-CM | POA: Diagnosis not present

## 2018-06-22 DIAGNOSIS — F5101 Primary insomnia: Secondary | ICD-10-CM | POA: Diagnosis not present

## 2018-06-22 DIAGNOSIS — G500 Trigeminal neuralgia: Secondary | ICD-10-CM | POA: Diagnosis not present

## 2018-06-22 DIAGNOSIS — G894 Chronic pain syndrome: Secondary | ICD-10-CM | POA: Diagnosis not present

## 2018-06-28 ENCOUNTER — Ambulatory Visit (INDEPENDENT_AMBULATORY_CARE_PROVIDER_SITE_OTHER): Payer: Medicare Other

## 2018-06-28 ENCOUNTER — Other Ambulatory Visit: Payer: Self-pay

## 2018-06-28 DIAGNOSIS — Z95810 Presence of automatic (implantable) cardiac defibrillator: Secondary | ICD-10-CM

## 2018-06-28 DIAGNOSIS — I5022 Chronic systolic (congestive) heart failure: Secondary | ICD-10-CM

## 2018-06-29 ENCOUNTER — Telehealth: Payer: Self-pay

## 2018-06-29 NOTE — Telephone Encounter (Signed)
Spoke with patient to remind of missed remote transmission 

## 2018-06-29 NOTE — Progress Notes (Signed)
EPIC Encounter for ICM Monitoring  Patient Name: RAEL TILLY is a 73 y.o. female Date: 06/29/2018 Primary Care Physican: Shirline Frees, MD Primary Cardiologist:Skains Electrophysiologist: Vergie Living Pacing: 94% LastWeight:160 lbs   Transmission reviewed.   Thoracic impedance normal.   Prescribed:Furosemide 80 mg 1 tablet daily.  Recommendations:None  Follow-up plan: ICM clinic phone appointment on5/06/2018.  Office visit with Dr Caryl Comes and Dr Marlou Porch 07/22/2018  Copy of ICM check sent to [place].  Direct Trend Viewer 06/29/2018    [name], RN 06/29/2018 5:40 PM

## 2018-07-05 ENCOUNTER — Ambulatory Visit (INDEPENDENT_AMBULATORY_CARE_PROVIDER_SITE_OTHER): Payer: Medicare Other | Admitting: *Deleted

## 2018-07-05 ENCOUNTER — Other Ambulatory Visit: Payer: Self-pay

## 2018-07-05 DIAGNOSIS — I255 Ischemic cardiomyopathy: Secondary | ICD-10-CM

## 2018-07-05 LAB — CUP PACEART REMOTE DEVICE CHECK
Battery Remaining Longevity: 22 mo
Battery Remaining Percentage: 28 %
Battery Voltage: 2.86 V
Brady Statistic AP VP Percent: 13 %
Brady Statistic AP VS Percent: 1 %
Brady Statistic AS VP Percent: 82 %
Brady Statistic AS VS Percent: 2.1 %
Brady Statistic RA Percent Paced: 11 %
Date Time Interrogation Session: 20200405080018
HighPow Impedance: 36 Ohm
HighPow Impedance: 36 Ohm
Implantable Lead Implant Date: 20090429
Implantable Lead Implant Date: 20090429
Implantable Lead Implant Date: 20090429
Implantable Lead Location: 753858
Implantable Lead Location: 753859
Implantable Lead Location: 753860
Implantable Lead Model: 7120
Implantable Pulse Generator Implant Date: 20150211
Lead Channel Impedance Value: 280 Ohm
Lead Channel Impedance Value: 440 Ohm
Lead Channel Impedance Value: 450 Ohm
Lead Channel Pacing Threshold Amplitude: 0.75 V
Lead Channel Pacing Threshold Amplitude: 0.75 V
Lead Channel Pacing Threshold Amplitude: 1.25 V
Lead Channel Pacing Threshold Pulse Width: 0.5 ms
Lead Channel Pacing Threshold Pulse Width: 0.5 ms
Lead Channel Pacing Threshold Pulse Width: 1 ms
Lead Channel Sensing Intrinsic Amplitude: 1.8 mV
Lead Channel Sensing Intrinsic Amplitude: 11.7 mV
Lead Channel Setting Pacing Amplitude: 1.5 V
Lead Channel Setting Pacing Amplitude: 2 V
Lead Channel Setting Pacing Amplitude: 2.5 V
Lead Channel Setting Pacing Pulse Width: 0.5 ms
Lead Channel Setting Pacing Pulse Width: 1 ms
Lead Channel Setting Sensing Sensitivity: 0.5 mV
Pulse Gen Serial Number: 7170199

## 2018-07-09 ENCOUNTER — Other Ambulatory Visit: Payer: Self-pay

## 2018-07-09 ENCOUNTER — Inpatient Hospital Stay (HOSPITAL_COMMUNITY)
Admission: EM | Admit: 2018-07-09 | Discharge: 2018-07-14 | DRG: 291 | Disposition: A | Discharge status: Home Health | Payer: Medicare Other | Attending: Internal Medicine | Admitting: Internal Medicine

## 2018-07-09 ENCOUNTER — Encounter (HOSPITAL_COMMUNITY): Payer: Self-pay | Admitting: Emergency Medicine

## 2018-07-09 DIAGNOSIS — I110 Hypertensive heart disease with heart failure: Principal | ICD-10-CM | POA: Diagnosis present

## 2018-07-09 DIAGNOSIS — Z8551 Personal history of malignant neoplasm of bladder: Secondary | ICD-10-CM

## 2018-07-09 DIAGNOSIS — Z79891 Long term (current) use of opiate analgesic: Secondary | ICD-10-CM

## 2018-07-09 DIAGNOSIS — Z950 Presence of cardiac pacemaker: Secondary | ICD-10-CM

## 2018-07-09 DIAGNOSIS — I509 Heart failure, unspecified: Secondary | ICD-10-CM

## 2018-07-09 DIAGNOSIS — Z79890 Hormone replacement therapy (postmenopausal): Secondary | ICD-10-CM

## 2018-07-09 DIAGNOSIS — G894 Chronic pain syndrome: Secondary | ICD-10-CM | POA: Diagnosis not present

## 2018-07-09 DIAGNOSIS — I5023 Acute on chronic systolic (congestive) heart failure: Secondary | ICD-10-CM | POA: Diagnosis not present

## 2018-07-09 DIAGNOSIS — K521 Toxic gastroenteritis and colitis: Secondary | ICD-10-CM | POA: Diagnosis not present

## 2018-07-09 DIAGNOSIS — I361 Nonrheumatic tricuspid (valve) insufficiency: Secondary | ICD-10-CM | POA: Diagnosis not present

## 2018-07-09 DIAGNOSIS — T3695XA Adverse effect of unspecified systemic antibiotic, initial encounter: Secondary | ICD-10-CM | POA: Diagnosis not present

## 2018-07-09 DIAGNOSIS — K219 Gastro-esophageal reflux disease without esophagitis: Secondary | ICD-10-CM | POA: Diagnosis not present

## 2018-07-09 DIAGNOSIS — Z20828 Contact with and (suspected) exposure to other viral communicable diseases: Secondary | ICD-10-CM | POA: Diagnosis present

## 2018-07-09 DIAGNOSIS — I252 Old myocardial infarction: Secondary | ICD-10-CM | POA: Diagnosis not present

## 2018-07-09 DIAGNOSIS — I255 Ischemic cardiomyopathy: Secondary | ICD-10-CM | POA: Diagnosis present

## 2018-07-09 DIAGNOSIS — J9601 Acute respiratory failure with hypoxia: Secondary | ICD-10-CM | POA: Diagnosis not present

## 2018-07-09 DIAGNOSIS — K761 Chronic passive congestion of liver: Secondary | ICD-10-CM | POA: Diagnosis present

## 2018-07-09 DIAGNOSIS — Z7982 Long term (current) use of aspirin: Secondary | ICD-10-CM

## 2018-07-09 DIAGNOSIS — G9341 Metabolic encephalopathy: Secondary | ICD-10-CM | POA: Diagnosis not present

## 2018-07-09 DIAGNOSIS — Z7951 Long term (current) use of inhaled steroids: Secondary | ICD-10-CM | POA: Diagnosis not present

## 2018-07-09 DIAGNOSIS — E785 Hyperlipidemia, unspecified: Secondary | ICD-10-CM | POA: Diagnosis present

## 2018-07-09 DIAGNOSIS — Z8249 Family history of ischemic heart disease and other diseases of the circulatory system: Secondary | ICD-10-CM

## 2018-07-09 DIAGNOSIS — G9340 Encephalopathy, unspecified: Secondary | ICD-10-CM

## 2018-07-09 DIAGNOSIS — Z79899 Other long term (current) drug therapy: Secondary | ICD-10-CM

## 2018-07-09 DIAGNOSIS — D649 Anemia, unspecified: Secondary | ICD-10-CM | POA: Diagnosis present

## 2018-07-09 DIAGNOSIS — F419 Anxiety disorder, unspecified: Secondary | ICD-10-CM | POA: Diagnosis present

## 2018-07-09 DIAGNOSIS — I2510 Atherosclerotic heart disease of native coronary artery without angina pectoris: Secondary | ICD-10-CM | POA: Diagnosis not present

## 2018-07-09 DIAGNOSIS — R7989 Other specified abnormal findings of blood chemistry: Secondary | ICD-10-CM

## 2018-07-09 DIAGNOSIS — Z955 Presence of coronary angioplasty implant and graft: Secondary | ICD-10-CM

## 2018-07-09 DIAGNOSIS — I5021 Acute systolic (congestive) heart failure: Secondary | ICD-10-CM | POA: Diagnosis not present

## 2018-07-09 DIAGNOSIS — R0602 Shortness of breath: Secondary | ICD-10-CM | POA: Diagnosis not present

## 2018-07-09 DIAGNOSIS — I340 Nonrheumatic mitral (valve) insufficiency: Secondary | ICD-10-CM | POA: Diagnosis not present

## 2018-07-09 DIAGNOSIS — R079 Chest pain, unspecified: Secondary | ICD-10-CM | POA: Diagnosis not present

## 2018-07-09 DIAGNOSIS — R945 Abnormal results of liver function studies: Secondary | ICD-10-CM

## 2018-07-09 NOTE — ED Triage Notes (Signed)
C/o nausea, dizziness, and SOB that started today.  States she feels like she is going to have diarrhea.

## 2018-07-10 ENCOUNTER — Observation Stay (HOSPITAL_COMMUNITY): Payer: Medicare Other

## 2018-07-10 ENCOUNTER — Other Ambulatory Visit: Payer: Self-pay

## 2018-07-10 ENCOUNTER — Emergency Department (HOSPITAL_COMMUNITY): Payer: Medicare Other

## 2018-07-10 DIAGNOSIS — R945 Abnormal results of liver function studies: Secondary | ICD-10-CM

## 2018-07-10 DIAGNOSIS — J9601 Acute respiratory failure with hypoxia: Secondary | ICD-10-CM

## 2018-07-10 DIAGNOSIS — G9340 Encephalopathy, unspecified: Secondary | ICD-10-CM

## 2018-07-10 DIAGNOSIS — R7989 Other specified abnormal findings of blood chemistry: Secondary | ICD-10-CM

## 2018-07-10 DIAGNOSIS — I509 Heart failure, unspecified: Secondary | ICD-10-CM

## 2018-07-10 LAB — POCT I-STAT 7, (LYTES, BLD GAS, ICA,H+H)
Acid-base deficit: 2 mmol/L (ref 0.0–2.0)
Bicarbonate: 24.6 mmol/L (ref 20.0–28.0)
Calcium, Ion: 1.32 mmol/L (ref 1.15–1.40)
HCT: 33 % — ABNORMAL LOW (ref 36.0–46.0)
Hemoglobin: 11.2 g/dL — ABNORMAL LOW (ref 12.0–15.0)
O2 Saturation: 99 %
Patient temperature: 97.4
Potassium: 4.8 mmol/L (ref 3.5–5.1)
Sodium: 139 mmol/L (ref 135–145)
TCO2: 26 mmol/L (ref 22–32)
pCO2 arterial: 45.8 mmHg (ref 32.0–48.0)
pH, Arterial: 7.335 — ABNORMAL LOW (ref 7.350–7.450)
pO2, Arterial: 127 mmHg — ABNORMAL HIGH (ref 83.0–108.0)

## 2018-07-10 LAB — AMMONIA: Ammonia: 23 umol/L (ref 9–35)

## 2018-07-10 LAB — URINALYSIS, ROUTINE W REFLEX MICROSCOPIC
Bilirubin Urine: NEGATIVE
Glucose, UA: NEGATIVE mg/dL
Ketones, ur: NEGATIVE mg/dL
Nitrite: NEGATIVE
Protein, ur: NEGATIVE mg/dL
Specific Gravity, Urine: 1.005 (ref 1.005–1.030)
pH: 6 (ref 5.0–8.0)

## 2018-07-10 LAB — COMPREHENSIVE METABOLIC PANEL
ALT: 70 U/L — ABNORMAL HIGH (ref 0–44)
AST: 84 U/L — ABNORMAL HIGH (ref 15–41)
Albumin: 3.5 g/dL (ref 3.5–5.0)
Alkaline Phosphatase: 134 U/L — ABNORMAL HIGH (ref 38–126)
Anion gap: 11 (ref 5–15)
BUN: 15 mg/dL (ref 8–23)
CO2: 19 mmol/L — ABNORMAL LOW (ref 22–32)
Calcium: 9.1 mg/dL (ref 8.9–10.3)
Chloride: 107 mmol/L (ref 98–111)
Creatinine, Ser: 0.79 mg/dL (ref 0.44–1.00)
GFR calc Af Amer: >60 mL/min (ref >60)
GFR calc non Af Amer: >60 mL/min (ref >60)
Glucose, Bld: 210 mg/dL — ABNORMAL HIGH (ref 70–99)
Potassium: 4.3 mmol/L (ref 3.5–5.1)
Sodium: 137 mmol/L (ref 135–145)
Total Bilirubin: 0.6 mg/dL (ref 0.3–1.2)
Total Protein: 6.9 g/dL (ref 6.5–8.1)

## 2018-07-10 LAB — RAPID URINE DRUG SCREEN, HOSP PERFORMED
Amphetamines: NOT DETECTED
Amphetamines: NOT DETECTED
Barbiturates: NOT DETECTED
Barbiturates: NOT DETECTED
Benzodiazepines: NOT DETECTED
Benzodiazepines: NOT DETECTED
Cocaine: NOT DETECTED
Cocaine: NOT DETECTED
Opiates: NOT DETECTED
Opiates: NOT DETECTED
Tetrahydrocannabinol: NOT DETECTED
Tetrahydrocannabinol: NOT DETECTED

## 2018-07-10 LAB — CBC
HCT: 35.7 % — ABNORMAL LOW (ref 36.0–46.0)
Hemoglobin: 10.9 g/dL — ABNORMAL LOW (ref 12.0–15.0)
MCH: 29.7 pg (ref 26.0–34.0)
MCHC: 30.5 g/dL (ref 30.0–36.0)
MCV: 97.3 fL (ref 80.0–100.0)
Platelets: 253 10*3/uL (ref 150–400)
RBC: 3.67 MIL/uL — ABNORMAL LOW (ref 3.87–5.11)
RDW: 15.8 % — ABNORMAL HIGH (ref 11.5–15.5)
WBC: 7.8 10*3/uL (ref 4.0–10.5)
nRBC: 0 % (ref 0.0–0.2)

## 2018-07-10 LAB — VITAMIN B12: Vitamin B-12: 215 pg/mL (ref 180–914)

## 2018-07-10 LAB — BRAIN NATRIURETIC PEPTIDE: B Natriuretic Peptide: 1778.4 pg/mL — ABNORMAL HIGH (ref 0.0–100.0)

## 2018-07-10 LAB — LIPASE, BLOOD: Lipase: 21 U/L (ref 11–51)

## 2018-07-10 LAB — IRON AND TIBC
Iron: 91 ug/dL (ref 28–170)
Saturation Ratios: 27 % (ref 10.4–31.8)
TIBC: 340 ug/dL (ref 250–450)
UIBC: 249 ug/dL

## 2018-07-10 LAB — RETICULOCYTES
Immature Retic Fract: 18 % — ABNORMAL HIGH (ref 2.3–15.9)
RBC.: 3.62 MIL/uL — ABNORMAL LOW (ref 3.87–5.11)
Retic Count, Absolute: 55.4 10*3/uL (ref 19.0–186.0)
Retic Ct Pct: 1.5 % (ref 0.4–3.1)

## 2018-07-10 LAB — FERRITIN: Ferritin: 63 ng/mL (ref 11–307)

## 2018-07-10 LAB — TSH: TSH: 3.04 u[IU]/mL (ref 0.350–4.500)

## 2018-07-10 LAB — TROPONIN I: Troponin I: <0.03 ng/mL (ref <0.03)

## 2018-07-10 LAB — FOLATE: Folate: 5.5 ng/mL — ABNORMAL LOW (ref >5.9)

## 2018-07-10 MED ORDER — ONDANSETRON 4 MG PO TBDP: 8.0000 mg | ORAL_TABLET | Freq: Once | ORAL | Status: DC

## 2018-07-10 MED ORDER — ENOXAPARIN SODIUM 40 MG/0.4ML ~~LOC~~ SOLN
40.0000 mg | SUBCUTANEOUS | Status: DC
  Administered 2018-07-10 – 2018-07-14 (×5): 40 mg via SUBCUTANEOUS
  Filled 2018-07-10 (×5): qty 0.4

## 2018-07-10 MED ORDER — FUROSEMIDE 10 MG/ML IJ SOLN
40.0000 mg | Freq: Once | INTRAMUSCULAR | Status: AC
  Administered 2018-07-10: 40 mg via INTRAVENOUS
  Filled 2018-07-10: qty 4

## 2018-07-10 MED ORDER — FUROSEMIDE 10 MG/ML IJ SOLN
40.0000 mg | Freq: Two times a day (BID) | INTRAMUSCULAR | Status: DC
  Administered 2018-07-10 – 2018-07-11 (×2): 40 mg via INTRAVENOUS
  Filled 2018-07-10: qty 4

## 2018-07-10 MED ORDER — NICOTINE 14 MG/24HR TD PT24
14.0000 mg | MEDICATED_PATCH | Freq: Every day | TRANSDERMAL | Status: DC
  Administered 2018-07-11 – 2018-07-13 (×3): 14 mg via TRANSDERMAL
  Filled 2018-07-10 (×4): qty 1

## 2018-07-10 MED ORDER — ONDANSETRON HCL 4 MG/2ML IJ SOLN
4.0000 mg | Freq: Once | INTRAMUSCULAR | Status: AC
  Administered 2018-07-10: 4 mg via INTRAVENOUS
  Filled 2018-07-10: qty 2

## 2018-07-10 NOTE — ED Notes (Signed)
Pt c/o of feeling SOB, stat at 87% on RA, placed on 3L [place]

## 2018-07-10 NOTE — ED Notes (Signed)
ED TO INPATIENT HANDOFF REPORT  ED Nurse Name and Phone #: Roxine Caddy, RN  S Name/Age/Gender [name] 73 y.o. female Room/Bed: 027C/027C  Code Status   Code Status: Prior  Home/SNF/Other Home Patient oriented to: self and place Is this baseline? Yes   Triage Complete: Triage complete  Chief Complaint Nausea  Triage Note C/o nausea, dizziness, and SOB that started today.  States she feels like she is going to have diarrhea.     Allergies No Known Allergies  Level of Care/Admitting Diagnosis ED Disposition    ED Disposition Condition Comment   Admit  Hospital Area: [facility] [[id number]]  Level of Care: Telemetry Cardiac [103]  I expect the patient will be discharged within 24 hours: No (not a candidate for 5C-Observation unit)  Diagnosis: Acute respiratory failure with hypoxia [facility]) [[id number]]  Admitting Physician: Shela Leff [[id number]]  Attending Physician: Shela Leff [[id number]]  Bed request comments: Low risk COVID-19  PT Class (Do Not Modify): Observation [104]  PT Acc Code (Do Not Modify): Observation [10022]       B Medical/Surgery History Past Medical History:  Diagnosis Date  . Anemia   . CAD (coronary artery disease)    Prior LAD stenting with stent thrombosis occurring in a withdrawal phase of her Plavix undertaken because of trigeminal neuralgia and release surgery. It was treated medically.  . Cancer ([place])    small bladder cancer removed no  treatment  . Cataract   . CHF (congestive heart failure) ([place])   . Complication of anesthesia    massive heart attack  . GERD (gastroesophageal reflux disease)   . Hyperlipidemia   . Ischemic cardiomyopathy   . Osteoarthritis   . SOB (shortness of breath)    Past Surgical History:  Procedure Laterality Date  . APPENDECTOMY    . BACK SURGERY     x2 back surgeries  . BI-VENTRICULAR IMPLANTABLE CARDIOVERTER DEFIBRILLATOR  (CRT-D)  2009; 05/2013   STJ CRTD generator change by  Dr Caryl Comes 05/2013  . BIV PACEMAKER GENERATOR CHANGE OUT Bilateral 05/11/2013   Procedure: BIV PACEMAKER GENERATOR CHANGE OUT;  Surgeon: Deboraha Sprang, MD;  Location: [facility] CATH LAB;  Service: Cardiovascular;  Laterality: Bilateral;  . BRAIN SURGERY     x3  . EYE SURGERY Right 05/24/12  . JOINT REPLACEMENT     thumbs and knees replacement  . TUBAL LIGATION       A IV Location/Drains/Wounds Patient Lines/Drains/Airways Status   Active Line/Drains/Airways    Name:   Placement date:   Placement time:   Site:   Days:   Peripheral IV 07/10/18 Right Forearm   07/10/18    0117    Forearm   less than 1   External Urinary Catheter   07/10/18    0327    -   less than 1          Intake/Output Last 24 hours  Intake/Output Summary (Last 24 hours) at 07/10/2018 0813 Last data filed at 07/10/2018 0706 Gross per 24 hour  Intake -  Output 1500 ml  Net -1500 ml    Labs/Imaging Results for orders placed or performed during the hospital encounter of 07/09/18 (from the past 48 hour(s))  CBC     Status: Abnormal   Collection Time: 07/10/18 12:00 AM  Result Value Ref Range   WBC 7.8 4.0 - 10.5 K/uL   RBC 3.67 (L) 3.87 - 5.11 MIL/uL   Hemoglobin 10.9 (L) 12.0 - 15.0 g/dL  HCT 35.7 (L) 36.0 - 46.0 %   MCV 97.3 80.0 - 100.0 fL   MCH 29.7 26.0 - 34.0 pg   MCHC 30.5 30.0 - 36.0 g/dL   RDW 15.8 (H) 11.5 - 15.5 %   Platelets 253 150 - 400 K/uL   nRBC 0.0 0.0 - 0.2 %    Comment: Performed at [facility] [address], [place], [place] [address]  Troponin I - ONCE - STAT     Status: None   Collection Time: 07/10/18 12:00 AM  Result Value Ref Range   Troponin I <0.03 <0.03 ng/mL    Comment: Performed at [facility], [place] [address] [place], [place] [address]  Lipase, blood     Status: None   Collection Time: 07/10/18 12:00 AM  Result Value Ref Range   Lipase 21 11 - 51 U/L    Comment: Performed at [facility] [address] [place], [place] [address]  Comprehensive  metabolic panel     Status: Abnormal   Collection Time: 07/10/18 12:00 AM  Result Value Ref Range   Sodium 137 135 - 145 mmol/L   Potassium 4.3 3.5 - 5.1 mmol/L   Chloride 107 98 - 111 mmol/L   CO2 19 (L) 22 - 32 mmol/L   Glucose, Bld 210 (H) 70 - 99 mg/dL   BUN 15 8 - 23 mg/dL   Creatinine, Ser 0.79 0.44 - 1.00 mg/dL   Calcium 9.1 8.9 - 10.3 mg/dL   Total Protein 6.9 6.5 - 8.1 g/dL   Albumin 3.5 3.5 - 5.0 g/dL   AST 84 (H) 15 - 41 U/L   ALT 70 (H) 0 - 44 U/L   Alkaline Phosphatase 134 (H) 38 - 126 U/L   Total Bilirubin 0.6 0.3 - 1.2 mg/dL   GFR calc non Af Amer >60 >60 mL/min   GFR calc Af Amer >60 >60 mL/min   Anion gap 11 5 - 15    Comment: Performed at [facility] [address], [place], [place] [address]  Brain natriuretic peptide     Status: Abnormal   Collection Time: 07/10/18 12:00 AM  Result Value Ref Range   B Natriuretic Peptide 1,778.4 (H) 0.0 - 100.0 pg/mL    Comment: Performed at [facility] [address], [place], [place] [address]  I-STAT 7, (LYTES, BLD GAS, ICA, H+H)     Status: Abnormal   Collection Time: 07/10/18  3:13 AM  Result Value Ref Range   pH, Arterial 7.335 (L) 7.350 - 7.450   pCO2 arterial 45.8 32.0 - 48.0 mmHg   pO2, Arterial 127.0 (H) 83.0 - 108.0 mmHg   Bicarbonate 24.6 20.0 - 28.0 mmol/L   TCO2 26 22 - 32 mmol/L   O2 Saturation 99.0 %   Acid-base deficit 2.0 0.0 - 2.0 mmol/L   Sodium 139 135 - 145 mmol/L   Potassium 4.8 3.5 - 5.1 mmol/L   Calcium, Ion 1.32 1.15 - 1.40 mmol/L   HCT 33.0 (L) 36.0 - 46.0 %   Hemoglobin 11.2 (L) 12.0 - 15.0 g/dL   Patient temperature 97.4 F    Collection site RADIAL, ALLEN'S TEST ACCEPTABLE    Drawn by Operator    Sample type ARTERIAL   Vitamin B12     Status: None   Collection Time: 07/10/18  5:44 AM  Result Value Ref Range   Vitamin B-12 215 180 - 914 pg/mL    Comment: (NOTE)  This assay is not validated for testing neonatal or myeloproliferative syndrome specimens for Vitamin B12  levels. Performed at [facility], [place] [address], [place], [place] [address]   Folate     Status: Abnormal   Collection Time: 07/10/18  5:44 AM  Result Value Ref Range   Folate 5.5 (L) >5.9 ng/mL    Comment: Performed at [facility], [place] [address] [place], [place] [address]  Iron and TIBC     Status: None   Collection Time: 07/10/18  5:44 AM  Result Value Ref Range   Iron 91 28 - 170 ug/dL   TIBC 340 250 - 450 ug/dL   Saturation Ratios 27 10.4 - 31.8 %   UIBC 249 ug/dL    Comment: Performed at [facility], [place] [address], [place], [place] [address]  Ferritin     Status: None   Collection Time: 07/10/18  5:44 AM  Result Value Ref Range   Ferritin 63 11 - 307 ng/mL    Comment: Performed at [facility] [address], [place], [place] [address]  Reticulocytes     Status: Abnormal   Collection Time: 07/10/18  5:44 AM  Result Value Ref Range   Retic Ct Pct 1.5 0.4 - 3.1 %   RBC. 3.62 (L) 3.87 - 5.11 MIL/uL   Retic Count, Absolute 55.4 19.0 - 186.0 K/uL   Immature Retic Fract 18.0 (H) 2.3 - 15.9 %    Comment: Performed at [facility] [address] [place], [place] [address]  Ammonia     Status: None   Collection Time: 07/10/18  5:44 AM  Result Value Ref Range   Ammonia 23 9 - 35 umol/L    Comment: Performed at [facility], [place] [address] [place], [place] [address]  TSH     Status: None   Collection Time: 07/10/18  5:44 AM  Result Value Ref Range   TSH 3.040 0.350 - 4.500 uIU/mL    Comment: Performed by a 3rd Generation assay with a functional sensitivity of <=0.01 uIU/mL. Performed at [facility], [place] [address] [place], [place] [address]   Urine rapid drug screen (hosp performed)     Status: None   Collection Time: 07/10/18  7:19 AM  Result Value Ref Range   Opiates NONE DETECTED NONE DETECTED   Cocaine NONE DETECTED NONE DETECTED   Benzodiazepines NONE DETECTED NONE DETECTED   Amphetamines NONE DETECTED NONE DETECTED    Tetrahydrocannabinol NONE DETECTED NONE DETECTED   Barbiturates NONE DETECTED NONE DETECTED    Comment: (NOTE) DRUG SCREEN FOR MEDICAL PURPOSES ONLY.  IF CONFIRMATION IS NEEDED FOR ANY PURPOSE, NOTIFY LAB WITHIN 5 DAYS. LOWEST DETECTABLE LIMITS FOR URINE DRUG SCREEN Drug Class                     Cutoff (ng/mL) Amphetamine and metabolites    1000 Barbiturate and metabolites    200 Benzodiazepine                 366 Tricyclics and metabolites     300 Opiates and metabolites        300 Cocaine and metabolites        300 THC                            50 Performed at [facility], [place]  [address], [place], [place] [address]   Urinalysis, Routine w reflex microscopic     Status: Abnormal   Collection Time: 07/10/18  7:19 AM  Result Value Ref Range   Color, Urine COLORLESS (A) YELLOW   APPearance CLEAR CLEAR   Specific Gravity, Urine 1.005 1.005 - 1.030   pH 6.0 5.0 - 8.0   Glucose, UA NEGATIVE NEGATIVE mg/dL   Hgb urine dipstick SMALL (A) NEGATIVE   Bilirubin Urine NEGATIVE NEGATIVE   Ketones, ur NEGATIVE NEGATIVE mg/dL   Protein, ur NEGATIVE NEGATIVE mg/dL   Nitrite NEGATIVE NEGATIVE   Leukocytes,Ua TRACE (A) NEGATIVE   RBC / HPF 0-5 0 - 5 RBC/hpf   WBC, UA 0-5 0 - 5 WBC/hpf   Bacteria, UA FEW (A) NONE SEEN   Squamous Epithelial / LPF 0-5 0 - 5    Comment: Performed at [facility], [address], [place], [place] [address]   Ct Head Wo Contrast  Result Date: 07/10/2018 CLINICAL DATA:  Encephalopathy.  Dizziness.  Dyspnea. EXAM: CT HEAD WITHOUT CONTRAST TECHNIQUE: Contiguous axial images were obtained from the base of the skull through the vertex without intravenous contrast. COMPARISON:  05/28/2018 head CT. FINDINGS: Brain: No evidence of parenchymal hemorrhage or extra-axial fluid collection. No mass lesion, mass effect, or midline shift. No CT evidence of acute infarction. Nonspecific mild subcortical and periventricular white matter hypodensity, most in  keeping with chronic small vessel ischemic change. Cerebral volume is age appropriate. No ventriculomegaly. Vascular: No acute abnormality. Skull: No evidence of calvarial fracture. Stable postsurgical changes from right suboccipital craniectomy. Sinuses/Orbits: The visualized paranasal sinuses are essentially clear. Other:  The mastoid air cells are unopacified. IMPRESSION: 1. No evidence of acute intracranial abnormality. No evidence of calvarial fracture. 2. Mild chronic small vessel ischemic changes in the cerebral white matter. 3. Stable postsurgical changes from right suboccipital craniectomy. Electronically Signed   By: Ilona Sorrel M.D.   On: 07/10/2018 06:48   Ct Chest Wo Contrast  Result Date: 07/10/2018 CLINICAL DATA:  Dyspnea, nausea, dizziness. Clinical concern for COVID-19. EXAM: CT CHEST WITHOUT CONTRAST TECHNIQUE: Multidetector CT imaging of the chest was performed following the standard protocol without IV contrast. COMPARISON:  Chest radiograph from earlier today. 07/16/2007 chest CT angiogram. FINDINGS: Motion degraded scan, limiting assessment. Cardiovascular: Moderate cardiomegaly. No significant pericardial effusion/thickening. Three-vessel coronary atherosclerosis. Three lead left subclavian ICD is noted with lead tips in the right atrium, right ventricular apex and coronary sinus. Atherosclerotic nonaneurysmal thoracic aorta. Dilated main pulmonary artery (4.2 cm diameter). Mediastinum/Nodes: No discrete thyroid nodules. Unremarkable esophagus. No axillary adenopathy. Mild right paratracheal adenopathy up to 1.2 cm (series 9/image 19). Enlarged 1.1 cm subcarinal node (series 9/image 27). Bilateral hilar soft tissue fullness, cannot exclude bilateral hilar adenopathy, poorly delineated on this noncontrast scan. Lungs/Pleura: No pneumothorax. No pleural effusion. Possible tracheobronchomalacia. No acute consolidative airspace disease or lung masses. Two tiny solid left pulmonary nodules,  largest 3 mm in the posterior left upper lobe (series 14/image 45), both stable since 07/16/2007 chest CT, considered benign. No new significant pulmonary nodules. Diffuse interlobular septal thickening in both lungs. Mild patchy ground-glass opacity in both lungs, most prominent in the upper lobes, without a peripheral predominance. Diffuse bronchial wall thickening. Upper abdomen: No acute abnormality. Musculoskeletal: No aggressive appearing focal osseous lesions. Moderate thoracic spondylosis. IMPRESSION: 1. Moderate cardiomegaly.  Three-vessel coronary atherosclerosis. 2. Prominently dilated main pulmonary artery, suggesting pulmonary arterial hypertension. 3. Diffuse interlobular septal thickening suggesting mild pulmonary edema. No pleural effusions. 4.  Mild patchy ground-glass opacity in both lungs, nonspecific, favored to be due to mild pulmonary edema. Early/mild viral pneumonia is not entirely excluded but is not strongly favored at this time. 5. Nonspecific mild mediastinal and probable bilateral hilar lymphadenopathy, poorly delineated on this noncontrast motion degraded scan. Suggest attention on follow-up chest CT with IV contrast in 3-6 months. Aortic Atherosclerosis (ICD10-I70.0). Electronically Signed   By: Ilona Sorrel M.D.   On: 07/10/2018 06:41   Dg Chest Port 1 View  Result Date: 07/10/2018 CLINICAL DATA:  Shortness of breath. EXAM: PORTABLE CHEST 1 VIEW COMPARISON:  05/28/2018 FINDINGS: Left-sided pacemaker in place. Chronic cardiomegaly with unchanged mediastinal contours. Bilateral hilar prominence which is chronic. Diffuse interstitial opacities no evidence of focal airspace disease, large pleural effusion or pneumothorax. Biapical pleuroparenchymal scarring. IMPRESSION: Chronic cardiomegaly. Diffuse interstitial opacities likely represent pulmonary edema. Electronically Signed   By: Keith Rake M.D.   On: 07/10/2018 00:54    Pending Labs Unresulted Labs (From admission,  onward)    Start     Ordered   07/11/18 9518  Basic metabolic panel  Tomorrow morning,   R     07/10/18 0506   07/11/18 0500  Hepatic function panel  Tomorrow morning,   R     07/10/18 0506   07/10/18 0740  Novel Coronavirus, NAA (hospital order; send-out to ref lab)  (Novel Coronavirus, NAA [facility] Order; send-out to ref lab) with precautions panel)  Once,   R    Question Answer Comment  Current symptoms Fever and Cough   Excluded other viral illnesses Yes   Exposure Risk None   Patient immune status Normal      07/10/18 0743          Vitals/Pain Today's Vitals   07/10/18 0530 07/10/18 0608 07/10/18 0630 07/10/18 0700  BP: 113/80 104/70 115/75 112/67  Pulse: 69 74 68 76  Resp: 11 19 19 15   Temp:      TempSrc:      SpO2: 100% 97% 95% 93%  PainSc:        Isolation Precautions Droplet and Contact precautions  Medications Medications  enoxaparin (LOVENOX) injection 40 mg (has no administration in time range)  nicotine (NICODERM CQ - dosed in mg/24 hours) patch 14 mg (has no administration in time range)  furosemide (LASIX) injection 40 mg (has no administration in time range)  ondansetron (ZOFRAN) injection 4 mg (4 mg Intravenous Given 07/10/18 0116)  furosemide (LASIX) injection 40 mg (40 mg Intravenous Given 07/10/18 0323)    Mobility walks with device Low fall risk   Focused Assessments Cardiac Assessment Handoff:  Cardiac Rhythm: Normal sinus rhythm Lab Results  Component Value Date   CKTOTAL 131 03/21/2011   CKMB 2.4 03/21/2011   TROPONINI <0.03 07/10/2018   No results found for: DDIMER Does the Patient currently have chest pain? No     R Recommendations: See Admitting Provider Note  Report given to:   Additional Notes:

## 2018-07-10 NOTE — H&P (Signed)
History and Physical    EMONY DORMER KHT:[id number] DOB: 1945/07/07 DOA: 07/09/2018  PCP: Shirline Frees, MD Patient coming from: Home  Chief Complaint: Shortness of breath  HPI: NOHEMY KOOP is a 73 y.o. female with medical history ischemic cardiomyopathy with EF in the 15 to 20% range status post biventricular pacemaker, CAD with stent, anemia, hyperlipidemia, GERD presenting to the hospital for evaluation of shortness of breath.  History very limited as patient was very somnolent.  Answers questions only upon repeated questioning.  Reports shortness of breath, orthopnea, and lower extremity edema but is not able to tell me for how long.  Denies any fevers or chills.  Denies chest pain.  She is not able to tell me if she is taking Lasix at home.  States her weighing scale is broken.  No additional history could be obtained.  Review of Systems: As per HPI otherwise 10 point review of systems negative.  Past Medical History:  Diagnosis Date  . Anemia   . CAD (coronary artery disease)    Prior LAD stenting with stent thrombosis occurring in a withdrawal phase of her Plavix undertaken because of trigeminal neuralgia and release surgery. It was treated medically.  . Cancer ([place])    small bladder cancer removed no  treatment  . Cataract   . CHF (congestive heart failure) ([place])   . Complication of anesthesia    massive heart attack  . GERD (gastroesophageal reflux disease)   . Hyperlipidemia   . Ischemic cardiomyopathy   . Osteoarthritis   . SOB (shortness of breath)     Past Surgical History:  Procedure Laterality Date  . APPENDECTOMY    . BACK SURGERY     x2 back surgeries  . BI-VENTRICULAR IMPLANTABLE CARDIOVERTER DEFIBRILLATOR  (CRT-D)  2009; 05/2013   STJ CRTD generator change by Dr Caryl Comes 05/2013  . BIV PACEMAKER GENERATOR CHANGE OUT Bilateral 05/11/2013   Procedure: BIV PACEMAKER GENERATOR CHANGE OUT;  Surgeon: Deboraha Sprang, MD;  Location: [facility] CATH LAB;  Service:  Cardiovascular;  Laterality: Bilateral;  . BRAIN SURGERY     x3  . EYE SURGERY Right 05/24/12  . JOINT REPLACEMENT     thumbs and knees replacement  . TUBAL LIGATION       reports that she has been smoking cigarettes. She has a 15.00 pack-year smoking history. She has never used smokeless tobacco. She reports that she does not drink alcohol or use drugs.  No Known Allergies  Family History  Problem Relation Age of Onset  . Heart disease Father     Prior to Admission medications   Medication Sig Start Date End Date Taking? Authorizing Provider  aspirin EC 81 MG tablet Take 1 tablet (81 mg total) by mouth daily. 04/19/15   Deboraha Sprang, MD  carbamazepine (TEGRETOL) 100 MG chewable tablet Chew 100 mg by mouth 2 (two) times daily.  10/20/15   [provider]  carvedilol (COREG) 3.125 MG tablet TAKE ONE TABLET BY MOUTH TWICE DAILY WITH A MEAL 08/11/17   Jerline Pain, MD  clorazepate (TRANXENE) 3.75 MG tablet Take 3.75 mg by mouth at bedtime.  05/25/18   [provider]  cyclobenzaprine (FLEXERIL) 5 MG tablet Take 1 tablet (5 mg total) by mouth 3 (three) times daily as needed for muscle spasms. Patient not taking: Reported on 05/28/2018 06/18/16   Barnet Glasgow, NP  furosemide (LASIX) 80 MG tablet TAKE ONE TABLET BY MOUTH EVERY DAY 10/29/17   Jerline Pain,  MD  gabapentin (NEURONTIN) 800 MG tablet Take 800 mg by mouth 3 (three) times daily. For pain 10/23/10   [provider]  iron polysaccharides (NIFEREX) 150 MG capsule Take 150 mg by mouth daily.     [provider]  levothyroxine (SYNTHROID, LEVOTHROID) 100 MCG tablet Take 1 tablet by mouth once daily 06/14/18   Elayne Snare, MD  levothyroxine (SYNTHROID, LEVOTHROID) 112 MCG tablet Take 56-112 mcg by mouth as directed. Take 1 tablet (112 mcg) daily except for Fridays Take 0.5 tablet (56 mcg) 12/22/17   [provider]  LORazepam (ATIVAN) 0.5 MG tablet Take 0.5 mg by mouth daily.  05/17/18    [provider]  mirtazapine (REMERON) 45 MG tablet Take 45 mg by mouth at bedtime. 05/17/18   [provider]  nitroGLYCERIN (NITROSTAT) 0.4 MG SL tablet Dissolve ONE TABLET UNDER THE TONGUE AS DIRECTED FOR CHEST pain Patient taking differently: 0.4 mg every 5 (five) minutes as needed for chest pain.  10/21/17   Richardson Dopp T, PA-C  NONFORMULARY OR COMPOUNDED ITEM Apply 1-2 g topically 4 (four) times daily. 01/03/16   Edrick Kins, DPM  Oxycodone HCl 10 MG TABS Take 1 tablet by mouth every 6 (six) hours as needed for pain. 10/02/16   [provider]  oxyCODONE-acetaminophen (PERCOCET) 5-325 MG tablet Take 1 tablet by mouth every 6 (six) hours as needed for severe pain. 05/28/18   Charlesetta Shanks, MD  potassium chloride SA (K-DUR,KLOR-CON) 20 MEQ tablet TAKE ONE TABLET BY MOUTH DAILY 10/06/17   Jerline Pain, MD  ramipril (ALTACE) 2.5 MG capsule Take 1 capsule (2.5 mg total) by mouth daily. 07/02/17   Jerline Pain, MD  simvastatin (ZOCOR) 10 MG tablet TAKE ONE TABLET (10 MG) BY MOUTH EVERY DAY Patient taking differently: Take 10 mg by mouth daily.  09/09/17   Jerline Pain, MD  spironolactone (ALDACTONE) 25 MG tablet Take 0.5 tablets (12.5 mg total) by mouth daily. 07/15/17   Jerline Pain, MD  venlafaxine XR (EFFEXOR-XR) 150 MG 24 hr capsule Take 150 mg by mouth daily with breakfast.  09/25/14   [provider]  VENTOLIN HFA 108 (90 Base) MCG/ACT inhaler Inhale 2 puffs into the lungs as needed for wheezing or shortness of breath.  10/13/16   [provider]  zolpidem (AMBIEN) 10 MG tablet Take 10 mg by mouth at bedtime. For sleep 09/23/10   [provider]    Physical Exam: Vitals:   07/10/18 0430 07/10/18 0500 07/10/18 0530 07/10/18 0608  BP: 112/80 109/74 113/80 104/70  Pulse: 73 70 69 74  Resp: 18 11 11 19   Temp:      TempSrc:      SpO2: 100% 99% 100% 97%    Physical Exam  Constitutional: No distress.  HENT:  Head: Normocephalic.   Eyes: Right eye exhibits no discharge. Left eye exhibits no discharge.  Neck: Neck supple.  No nuchal rigidity  Cardiovascular: Normal rate, regular rhythm and intact distal pulses.  Pulmonary/Chest: Effort normal. She has no wheezes. She has rales.  Bibasilar rales On 3 L supplemental oxygen  Abdominal: Soft. Bowel sounds are normal. She exhibits no distension. There is no abdominal tenderness. There is no guarding.  Musculoskeletal:        General: Edema present.     Comments: +2 pedal edema bilaterally.  Edema extending up to the knees bilaterally.  Neurological:  Very somnolent Following commands only intermittently Moving all extremities spontaneously  Skin: Skin  is warm and dry. She is not diaphoretic.     Labs on Admission: I have personally reviewed following labs and imaging studies  CBC: Recent Labs  Lab 07/10/18 0000 07/10/18 0313  WBC 7.8  --   HGB 10.9* 11.2*  HCT 35.7* 33.0*  MCV 97.3  --   PLT 253  --    Basic Metabolic Panel: Recent Labs  Lab 07/10/18 0000 07/10/18 0313  NA 137 139  K 4.3 4.8  CL 107  --   CO2 19*  --   GLUCOSE 210*  --   BUN 15  --   CREATININE 0.79  --   CALCIUM 9.1  --    GFR: CrCl cannot be calculated (Unknown ideal weight.). Liver Function Tests: Recent Labs  Lab 07/10/18 0000  AST 84*  ALT 70*  ALKPHOS 134*  BILITOT 0.6  PROT 6.9  ALBUMIN 3.5   Recent Labs  Lab 07/10/18 0000  LIPASE 21   Recent Labs  Lab 07/10/18 0544  AMMONIA 23   Coagulation Profile: No results for input(s): INR, PROTIME in the last 168 hours. Cardiac Enzymes: Recent Labs  Lab 07/10/18 0000  TROPONINI <0.03   BNP (last 3 results) No results for input(s): PROBNP in the last 8760 hours. HbA1C: No results for input(s): HGBA1C in the last 72 hours. CBG: No results for input(s): GLUCAP in the last 168 hours. Lipid Profile: No results for input(s): CHOL, HDL, LDLCALC, TRIG, CHOLHDL, LDLDIRECT in the last 72 hours. Thyroid Function  Tests: No results for input(s): TSH, T4TOTAL, FREET4, T3FREE, THYROIDAB in the last 72 hours. Anemia Panel: Recent Labs    07/10/18 0544  RETICCTPCT 1.5   Urine analysis:    Component Value Date/Time   COLORURINE YELLOW 03/22/2011 2007   APPEARANCEUR HAZY (A) 03/22/2011 2007   LABSPEC 1.008 03/22/2011 2007   PHURINE 7.0 03/22/2011 2007   GLUCOSEU NEGATIVE 03/22/2011 2007   HGBUR NEGATIVE 03/22/2011 2007   BILIRUBINUR NEGATIVE 03/22/2011 2007   KETONESUR NEGATIVE 03/22/2011 2007   PROTEINUR NEGATIVE 03/22/2011 2007   UROBILINOGEN 1.0 03/22/2011 2007   NITRITE NEGATIVE 03/22/2011 2007   LEUKOCYTESUR NEGATIVE 03/22/2011 2007    Radiological Exams on Admission: Dg Chest Port 1 View  Result Date: 07/10/2018 CLINICAL DATA:  Shortness of breath. EXAM: PORTABLE CHEST 1 VIEW COMPARISON:  05/28/2018 FINDINGS: Left-sided pacemaker in place. Chronic cardiomegaly with unchanged mediastinal contours. Bilateral hilar prominence which is chronic. Diffuse interstitial opacities no evidence of focal airspace disease, large pleural effusion or pneumothorax. Biapical pleuroparenchymal scarring. IMPRESSION: Chronic cardiomegaly. Diffuse interstitial opacities likely represent pulmonary edema. Electronically Signed   By: Keith Rake M.D.   On: 07/10/2018 00:54    EKG: Independently reviewed.  Paced rhythm.  Assessment/Plan Principal Problem:   Acute respiratory failure with hypoxia (HCC) Active Problems:   CAD (coronary artery disease)   CHF exacerbation (HCC)   Acute encephalopathy   Elevated LFTs   Acute hypoxic respiratory failure secondary to acute exacerbation of chronic systolic congestive heart failure History of chronic systolic congestive heart failure (EF 15 to 20%) secondary to ischemic cardiomyopathy status post biventricular ICD.  SPO2 87% on room air and currently requiring 3 L supplemental oxygen.  ABG with pH 7.33, PCO2 45, PO2 127.  BNP 1778. Chest x-ray showing chronic  cardiomegaly and diffuse interstitial pulmonary opacities likely representing pulmonary edema. -Received IV Lasix 40 mg in the ED.  Continue IV Lasix 40 mg every 12 hours. -Monitor intake and output, daily weights, low-sodium diet  with fluid restriction -Supplemental oxygen -Although pulmonary opacities seen on x-ray likely represent pulmonary edema given elevated BNP, chest CT has been ordered for further evaluation in the setting of current COVID-19 pandemic in local community spread.  Patient appears very lethargic and somnolent on exam which brings concern for possible underlying infection.  LFTs elevated. -Chest CT currently pending.  Keep under droplet and contact precautions at this time.  Acute encephalopathy Patient is very somnolent and lethargic.  Oriented to person only.  No history of dementia listed in the chart.  There remains suspicion for underlying infection although she is afebrile and does not have leukocytosis.  No focal motor weakness.  No nuchal rigidity. -Head CT pending -Chest CT as mentioned above to rule out possible pneumonia -Check ammonia level, TSH, B12 -UDS -UA  Chronic anemia -Hemoglobin 10.9, no recent baseline.  No signs of active bleeding at this time. -Anemia panel  Elevated LFTs AST 84, ALT 70, alk phos 134.  T bili normal.  Question whether related to underlying viral infection.  Abdominal exam benign. -Repeat hepatic function panel in a.m.  History of CAD status post stent -Patient not complaining of chest pain.  Troponin negative.  EKG with paced rhythm.  Tobacco use -NicoDerm patch  Unable to safely order home medications at this time as pharmacy medication reconciliation is pending.  DVT prophylaxis: Lovenox Code Status: Full code Family Communication: No family available. Disposition Plan: Anticipate discharge after clinical improvement.   Consults called: None Admission status: Observation, telemetry  This chart was dictated using  voice recognition software.  Despite best efforts to proofread, errors can occur which can change the documentation meaning.  Shela Leff MD [facility] Pager [phone]  If 7PM-7AM, please contact night-coverage [email or web address] Password [facility]  07/10/2018, 6:32 AM

## 2018-07-10 NOTE — Progress Notes (Signed)
[facility] OF CARE NOTE Patient: [name] WGN:[id number]   PCP: Shirline Frees, MD DOB: Sep 07, 1945   DOA: 07/09/2018   DOS: 07/10/2018    Patient was admitted by my colleague Dr. Marlowe Sax earlier on 07/10/2018. I have reviewed the H&P as well as assessment and plan and agree with the same. Important changes in the plan are listed below.  Plan of care: Principal Problem:   Acute respiratory failure with hypoxia ([place]) Active Problems:   CAD (coronary artery disease)   CHF exacerbation (HCC)   Acute encephalopathy   Elevated LFTs COVID-19 testing ordered.  At the time of my evaluation the patient was unresponsive to sternal rub. Later on when the RN was interjecting the patient patient was communicating with her and following all the commands. ABG shows no hypercarbia. Even at the time of the ABG the patient did not flinch. Discussed with patient's friend who is listed as emergency contact.  History of chronic pain as well as chronic anxiety. Patient is on chronic OxyContin as well as Ativan. Her UDS is negative twice. Concerning for patient not using her medicine or diverging. Will not provide any psychotropic medication here in the hospital with a concern for unresponsive episode at the time of my evaluation.  Author: Berle Mull, MD [facility] 07/10/2018 7:06 PM   If 7PM-7AM, please contact night-coverage at [email or web address]

## 2018-07-10 NOTE — ED Provider Notes (Signed)
[facility] EMERGENCY DEPARTMENT Provider Note   CSN: [id number] Arrival date & time: 07/09/18  2343    History   Chief Complaint Chief Complaint  Patient presents with  . Nausea  . Dizziness  . Shortness of Breath    HPI [name] is a 73 y.o. female.     The history is provided by the patient.  Shortness of Breath  Severity:  Moderate Onset quality:  Gradual Duration:  1 day Timing:  Constant Progression:  Worsening Chronicity:  New Relieved by:  Nothing Worsened by:  Nothing Associated symptoms: chest pain   Associated symptoms: no cough, no fever and no vomiting   PT with History of anemia, CAD, CHF, ICD in place without recent shocks Since with multiple complaints.  She reports nausea, lightheadedness and shortness of breath over the past day.  She is also had some chest pain, but none at this time.  No vomiting, no diarrhea.  Past Medical History:  Diagnosis Date  . Anemia   . CAD (coronary artery disease)    Prior LAD stenting with stent thrombosis occurring in a withdrawal phase of her Plavix undertaken because of trigeminal neuralgia and release surgery. It was treated medically.  . Cancer ([place])    small bladder cancer removed no  treatment  . Cataract   . CHF (congestive heart failure) ([place])   . Complication of anesthesia    massive heart attack  . GERD (gastroesophageal reflux disease)   . Hyperlipidemia   . Ischemic cardiomyopathy   . Osteoarthritis   . SOB (shortness of breath)     Patient Active Problem List   Diagnosis Date Noted  . Biventricular ICD (implantable cardioverter-defibrillator) in place 10/12/2013  . Essential hypertension 10/12/2013  . Lumbar post-laminectomy syndrome 05/23/2013  . 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 Excessive sleepiness 05/21/2012  . COPD (chronic obstructive pulmonary disease) ([place]) 05/21/2012  . Osteoarthritis of both knees 08/08/2011  . Anemia 03/21/2011  . Trigeminal  neuralgia 03/21/2011  . Hyperlipidemia 03/21/2011  . CAD (coronary artery disease)   . Ischemic cardiomyopathy   . Biventricular implantable cardioverter-defibrillator in situ   . Chronic systolic heart failure [facility])     Past Surgical History:  Procedure Laterality Date  . APPENDECTOMY    . BACK SURGERY     x2 back surgeries  . BI-VENTRICULAR IMPLANTABLE CARDIOVERTER DEFIBRILLATOR  (CRT-D)  2009; 05/2013   STJ CRTD generator change by Dr Caryl Comes 05/2013  . BIV PACEMAKER GENERATOR CHANGE OUT Bilateral 05/11/2013   Procedure: BIV PACEMAKER GENERATOR CHANGE OUT;  Surgeon: Deboraha Sprang, MD;  Location: [facility] CATH LAB;  Service: Cardiovascular;  Laterality: Bilateral;  . BRAIN SURGERY     x3  . EYE SURGERY Right 05/24/12  . JOINT REPLACEMENT     thumbs and knees replacement  . TUBAL LIGATION       OB History   No obstetric history on file.      Home Medications    Prior to Admission medications   Medication Sig Start Date End Date Taking? Authorizing Provider  amoxicillin-clavulanate (AUGMENTIN) 875-125 MG tablet Take 1 tablet by mouth 2 (two) times daily. One po bid x 7 days 05/28/18   Charlesetta Shanks, MD  aspirin EC 81 MG tablet Take 1 tablet (81 mg total) by mouth daily. 04/19/15   Deboraha Sprang, MD  carbamazepine (TEGRETOL) 100 MG chewable tablet Chew 100 mg by mouth 2 (two) times daily.  10/20/15   [provider]  carvedilol (COREG) 3.125 MG tablet TAKE ONE TABLET BY MOUTH TWICE DAILY WITH A MEAL 08/11/17   Jerline Pain, MD  clorazepate (TRANXENE) 3.75 MG tablet Take 3.75 mg by mouth at bedtime.  05/25/18   [provider]  cyclobenzaprine (FLEXERIL) 5 MG tablet Take 1 tablet (5 mg total) by mouth 3 (three) times daily as needed for muscle spasms. Patient not taking: Reported on 05/28/2018 06/18/16   Barnet Glasgow, NP  furosemide (LASIX) 80 MG tablet TAKE ONE TABLET BY MOUTH EVERY DAY 10/29/17   Jerline Pain, MD  gabapentin (NEURONTIN) 800 MG tablet Take 800 mg  by mouth 3 (three) times daily. For pain 10/23/10   [provider]  iron polysaccharides (NIFEREX) 150 MG capsule Take 150 mg by mouth daily.     [provider]  levothyroxine (SYNTHROID, LEVOTHROID) 100 MCG tablet Take 1 tablet by mouth once daily 06/14/18   Elayne Snare, MD  levothyroxine (SYNTHROID, LEVOTHROID) 112 MCG tablet Take 56-112 mcg by mouth as directed. Take 1 tablet (112 mcg) daily except for Fridays Take 0.5 tablet (56 mcg) 12/22/17   [provider]  LORazepam (ATIVAN) 0.5 MG tablet Take 0.5 mg by mouth daily.  05/17/18   [provider]  mirtazapine (REMERON) 45 MG tablet Take 45 mg by mouth at bedtime. 05/17/18   [provider]  nitroGLYCERIN (NITROSTAT) 0.4 MG SL tablet Dissolve ONE TABLET UNDER THE TONGUE AS DIRECTED FOR CHEST pain Patient taking differently: 0.4 mg every 5 (five) minutes as needed for chest pain.  10/21/17   Richardson Dopp T, PA-C  NONFORMULARY OR COMPOUNDED ITEM Apply 1-2 g topically 4 (four) times daily. 01/03/16   Edrick Kins, DPM  Oxycodone HCl 10 MG TABS Take 1 tablet by mouth every 6 (six) hours as needed for pain. 10/02/16   [provider]  oxyCODONE-acetaminophen (PERCOCET) 5-325 MG tablet Take 1 tablet by mouth every 6 (six) hours as needed for severe pain. 05/28/18   Charlesetta Shanks, MD  potassium chloride SA (K-DUR,KLOR-CON) 20 MEQ tablet TAKE ONE TABLET BY MOUTH DAILY 10/06/17   Jerline Pain, MD  ramipril (ALTACE) 2.5 MG capsule Take 1 capsule (2.5 mg total) by mouth daily. 07/02/17   Jerline Pain, MD  simvastatin (ZOCOR) 10 MG tablet TAKE ONE TABLET (10 MG) BY MOUTH EVERY DAY Patient taking differently: Take 10 mg by mouth daily.  09/09/17   Jerline Pain, MD  spironolactone (ALDACTONE) 25 MG tablet Take 0.5 tablets (12.5 mg total) by mouth daily. 07/15/17   Jerline Pain, MD  venlafaxine XR (EFFEXOR-XR) 150 MG 24 hr capsule Take 150 mg by mouth daily with breakfast.  09/25/14   [provider]   VENTOLIN HFA 108 (90 Base) MCG/ACT inhaler Inhale 2 puffs into the lungs as needed for wheezing or shortness of breath.  10/13/16   [provider]  zolpidem (AMBIEN) 10 MG tablet Take 10 mg by mouth at bedtime. For sleep 09/23/10   [provider]    Family History Family History  Problem Relation Age of Onset  . Heart disease Father     Social History Social History   Tobacco Use  . Smoking status: Current Some Day Smoker    Packs/day: 0.50    Years: 30.00    Pack years: 15.00    Types: Cigarettes  . Smokeless tobacco: Never Used  Substance Use Topics  . Alcohol use: No  . Drug use: No     Allergies  Patient has no known allergies.   Review of Systems Review of Systems  Constitutional: Negative for fever.  Respiratory: Positive for shortness of breath. Negative for cough.   Cardiovascular: Positive for chest pain and leg swelling.  Gastrointestinal: Negative for vomiting.  Neurological: Negative for syncope.  All other systems reviewed and are negative.    Physical Exam Updated Vital Signs BP 124/81 (BP Location: Right Arm)   Pulse 82   Temp (!) 97.5 F (36.4 C) (Oral)   Resp 16   LMP  (LMP Unknown)   SpO2 97%   Physical Exam CONSTITUTIONAL: Elderly, ill-appearing HEAD: Normocephalic/atraumatic EYES: EOMI, chronic ptosis OD ENMT: Mucous membranes moist NECK: supple no meningeal signs SPINE/BACK:entire spine nontender CV: S1/S2 noted LUNGS: Lungs are clear to auscultation bilaterally, no apparent distress ABDOMEN: soft, nontender, no rebound or guarding, bowel sounds noted throughout abdomen GU:no cva tenderness NEURO: Pt is awake/alert/appropriate, moves all extremitiesx4.  No facial droop.   EXTREMITIES: pulses normal/equal, full ROM, pitting edema noted lower extremities SKIN: warm, color normal PSYCH: Mildly anxious  ED Treatments / Results  Labs (all labs ordered are listed, but only abnormal results are displayed) Labs  Reviewed  CBC - Abnormal; Notable for the following components:      Result Value   RBC 3.67 (*)    Hemoglobin 10.9 (*)    HCT 35.7 (*)    RDW 15.8 (*)    All other components within normal limits  COMPREHENSIVE METABOLIC PANEL - Abnormal; Notable for the following components:   CO2 19 (*)    Glucose, Bld 210 (*)    AST 84 (*)    ALT 70 (*)    Alkaline Phosphatase 134 (*)    All other components within normal limits  BRAIN NATRIURETIC PEPTIDE - Abnormal; Notable for the following components:   B Natriuretic Peptide 1,778.4 (*)    All other components within normal limits  POCT I-STAT 7, (LYTES, BLD GAS, ICA,H+H) - Abnormal; Notable for the following components:   pH, Arterial 7.335 (*)    pO2, Arterial 127.0 (*)    HCT 33.0 (*)    Hemoglobin 11.2 (*)    All other components within normal limits  TROPONIN I  LIPASE, BLOOD  I-STAT ARTERIAL BLOOD GAS, ED    EKG EKG Interpretation  Date/Time:  Friday July 09 2018 23:58:07 EDT Ventricular Rate:  83 PR Interval:  150 QRS Duration: 162 QT Interval:  464 QTC Calculation: 545 R Axis:   -84 Text Interpretation:  Atrial-sensed ventricular-paced rhythm Abnormal ECG When compared with ECG of 03/21/2011, Atrial-sensed ventricular-paced rhythm has replaced Sinus rhythm Confirmed by Delora Fuel ([id number]) on 07/10/2018 12:11:57 AM   Radiology Dg Chest Port 1 View  Result Date: 07/10/2018 CLINICAL DATA:  Shortness of breath. EXAM: PORTABLE CHEST 1 VIEW COMPARISON:  05/28/2018 FINDINGS: Left-sided pacemaker in place. Chronic cardiomegaly with unchanged mediastinal contours. Bilateral hilar prominence which is chronic. Diffuse interstitial opacities no evidence of focal airspace disease, large pleural effusion or pneumothorax. Biapical pleuroparenchymal scarring. IMPRESSION: Chronic cardiomegaly. Diffuse interstitial opacities likely represent pulmonary edema. Electronically Signed   By: Keith Rake M.D.   On: 07/10/2018 00:54     Procedures Procedures Medications Ordered in ED Medications  ondansetron (ZOFRAN) injection 4 mg (4 mg Intravenous Given 07/10/18 0116)  furosemide (LASIX) injection 40 mg (40 mg Intravenous Given 07/10/18 0323)     Initial Impression / Assessment and Plan / ED Course  I have reviewed the triage vital signs and the nursing  notes.  Pertinent labs & imaging results that were available during my care of the patient were reviewed by me and considered in my medical decision making (see chart for details).        4:07 AM Patient presented with multiple complaints, but she reports her main issue is increasing shortness of breath. X-ray and labs are consistent with acute CHF.  She did have increasing shortness of breath in the ED and required nasal cannula. She is afebrile, making COVID or pneumonia less likely  Patient will be admitted for further management and diuresis. 4:23 AM D/w dr Marlowe Sax for admission for CHF Final Clinical Impressions(s) / ED Diagnoses   Final diagnoses:  Acute systolic congestive heart failure [facility])    ED Discharge Orders    None       Ripley Fraise, MD 07/10/18 [phone]

## 2018-07-11 ENCOUNTER — Other Ambulatory Visit: Payer: Self-pay

## 2018-07-11 DIAGNOSIS — Z79890 Hormone replacement therapy (postmenopausal): Secondary | ICD-10-CM | POA: Diagnosis not present

## 2018-07-11 DIAGNOSIS — I255 Ischemic cardiomyopathy: Secondary | ICD-10-CM | POA: Diagnosis present

## 2018-07-11 DIAGNOSIS — J9601 Acute respiratory failure with hypoxia: Secondary | ICD-10-CM | POA: Diagnosis present

## 2018-07-11 DIAGNOSIS — T3695XA Adverse effect of unspecified systemic antibiotic, initial encounter: Secondary | ICD-10-CM | POA: Diagnosis present

## 2018-07-11 DIAGNOSIS — Z79891 Long term (current) use of opiate analgesic: Secondary | ICD-10-CM | POA: Diagnosis not present

## 2018-07-11 DIAGNOSIS — K219 Gastro-esophageal reflux disease without esophagitis: Secondary | ICD-10-CM | POA: Diagnosis present

## 2018-07-11 DIAGNOSIS — K761 Chronic passive congestion of liver: Secondary | ICD-10-CM | POA: Diagnosis present

## 2018-07-11 DIAGNOSIS — Z8249 Family history of ischemic heart disease and other diseases of the circulatory system: Secondary | ICD-10-CM | POA: Diagnosis not present

## 2018-07-11 DIAGNOSIS — D649 Anemia, unspecified: Secondary | ICD-10-CM | POA: Diagnosis present

## 2018-07-11 DIAGNOSIS — I252 Old myocardial infarction: Secondary | ICD-10-CM | POA: Diagnosis not present

## 2018-07-11 DIAGNOSIS — K521 Toxic gastroenteritis and colitis: Secondary | ICD-10-CM | POA: Diagnosis present

## 2018-07-11 DIAGNOSIS — Z955 Presence of coronary angioplasty implant and graft: Secondary | ICD-10-CM | POA: Diagnosis not present

## 2018-07-11 DIAGNOSIS — Z950 Presence of cardiac pacemaker: Secondary | ICD-10-CM | POA: Diagnosis not present

## 2018-07-11 DIAGNOSIS — G894 Chronic pain syndrome: Secondary | ICD-10-CM | POA: Diagnosis present

## 2018-07-11 DIAGNOSIS — G9341 Metabolic encephalopathy: Secondary | ICD-10-CM | POA: Diagnosis not present

## 2018-07-11 DIAGNOSIS — Z8551 Personal history of malignant neoplasm of bladder: Secondary | ICD-10-CM | POA: Diagnosis not present

## 2018-07-11 DIAGNOSIS — Z7951 Long term (current) use of inhaled steroids: Secondary | ICD-10-CM | POA: Diagnosis not present

## 2018-07-11 DIAGNOSIS — I361 Nonrheumatic tricuspid (valve) insufficiency: Secondary | ICD-10-CM | POA: Diagnosis not present

## 2018-07-11 DIAGNOSIS — Z20828 Contact with and (suspected) exposure to other viral communicable diseases: Secondary | ICD-10-CM | POA: Diagnosis present

## 2018-07-11 DIAGNOSIS — E785 Hyperlipidemia, unspecified: Secondary | ICD-10-CM | POA: Diagnosis present

## 2018-07-11 DIAGNOSIS — I340 Nonrheumatic mitral (valve) insufficiency: Secondary | ICD-10-CM | POA: Diagnosis not present

## 2018-07-11 DIAGNOSIS — F419 Anxiety disorder, unspecified: Secondary | ICD-10-CM | POA: Diagnosis present

## 2018-07-11 DIAGNOSIS — I2510 Atherosclerotic heart disease of native coronary artery without angina pectoris: Secondary | ICD-10-CM | POA: Diagnosis present

## 2018-07-11 DIAGNOSIS — I5023 Acute on chronic systolic (congestive) heart failure: Secondary | ICD-10-CM | POA: Diagnosis present

## 2018-07-11 DIAGNOSIS — I110 Hypertensive heart disease with heart failure: Secondary | ICD-10-CM | POA: Diagnosis present

## 2018-07-11 DIAGNOSIS — Z7982 Long term (current) use of aspirin: Secondary | ICD-10-CM | POA: Diagnosis not present

## 2018-07-11 LAB — BASIC METABOLIC PANEL
Anion gap: 12 (ref 5–15)
BUN: 15 mg/dL (ref 8–23)
CO2: 26 mmol/L (ref 22–32)
Calcium: 9.1 mg/dL (ref 8.9–10.3)
Chloride: 101 mmol/L (ref 98–111)
Creatinine, Ser: 0.85 mg/dL (ref 0.44–1.00)
GFR calc Af Amer: >60 mL/min (ref >60)
GFR calc non Af Amer: >60 mL/min (ref >60)
Glucose, Bld: 97 mg/dL (ref 70–99)
Potassium: 3.4 mmol/L — ABNORMAL LOW (ref 3.5–5.1)
Sodium: 139 mmol/L (ref 135–145)

## 2018-07-11 LAB — HEPATIC FUNCTION PANEL
ALT: 42 U/L (ref 0–44)
AST: 27 U/L (ref 15–41)
Albumin: 3.1 g/dL — ABNORMAL LOW (ref 3.5–5.0)
Alkaline Phosphatase: 114 U/L (ref 38–126)
Bilirubin, Direct: 0.1 mg/dL (ref 0.0–0.2)
Indirect Bilirubin: 0.6 mg/dL (ref 0.3–0.9)
Total Bilirubin: 0.7 mg/dL (ref 0.3–1.2)
Total Protein: 6.1 g/dL — ABNORMAL LOW (ref 6.5–8.1)

## 2018-07-11 MED ORDER — CARVEDILOL 3.125 MG PO TABS
3.1250 mg | ORAL_TABLET | Freq: Two times a day (BID) | ORAL | Status: DC
  Administered 2018-07-11 – 2018-07-14 (×6): 3.125 mg via ORAL
  Filled 2018-07-11 (×6): qty 1

## 2018-07-11 MED ORDER — CARBAMAZEPINE 100 MG PO CHEW
100.0000 mg | CHEWABLE_TABLET | Freq: Two times a day (BID) | ORAL | Status: DC
  Administered 2018-07-11 – 2018-07-14 (×7): 100 mg via ORAL
  Filled 2018-07-11 (×9): qty 1

## 2018-07-11 MED ORDER — GABAPENTIN 400 MG PO CAPS
800.0000 mg | ORAL_CAPSULE | Freq: Three times a day (TID) | ORAL | Status: DC
  Administered 2018-07-11 – 2018-07-13 (×7): 800 mg via ORAL
  Filled 2018-07-11 (×7): qty 2

## 2018-07-11 MED ORDER — FUROSEMIDE 10 MG/ML IJ SOLN
40.0000 mg | Freq: Every day | INTRAMUSCULAR | Status: DC
  Administered 2018-07-12 – 2018-07-14 (×3): 40 mg via INTRAVENOUS
  Filled 2018-07-11 (×3): qty 4

## 2018-07-11 MED ORDER — POLYSACCHARIDE IRON COMPLEX 150 MG PO CAPS
150.0000 mg | ORAL_CAPSULE | Freq: Every day | ORAL | Status: DC
  Administered 2018-07-11 – 2018-07-14 (×4): 150 mg via ORAL
  Filled 2018-07-11 (×4): qty 1

## 2018-07-11 MED ORDER — POTASSIUM CHLORIDE CRYS ER 20 MEQ PO TBCR
40.0000 meq | EXTENDED_RELEASE_TABLET | Freq: Every day | ORAL | Status: DC
  Administered 2018-07-11 – 2018-07-14 (×4): 40 meq via ORAL
  Filled 2018-07-11 (×4): qty 2

## 2018-07-11 MED ORDER — MIRTAZAPINE 7.5 MG PO TABS
7.5000 mg | ORAL_TABLET | Freq: Every day | ORAL | Status: DC
  Administered 2018-07-11 – 2018-07-13 (×3): 7.5 mg via ORAL
  Filled 2018-07-11 (×3): qty 1

## 2018-07-11 MED ORDER — RAMIPRIL 2.5 MG PO CAPS
2.5000 mg | ORAL_CAPSULE | Freq: Every day | ORAL | Status: DC
  Administered 2018-07-11 – 2018-07-14 (×4): 2.5 mg via ORAL
  Filled 2018-07-11 (×5): qty 1

## 2018-07-11 MED ORDER — SIMVASTATIN 20 MG PO TABS
10.0000 mg | ORAL_TABLET | Freq: Every day | ORAL | Status: DC
  Administered 2018-07-11 – 2018-07-14 (×4): 10 mg via ORAL
  Filled 2018-07-11 (×4): qty 1

## 2018-07-11 MED ORDER — SPIRONOLACTONE 12.5 MG HALF TABLET
12.5000 mg | ORAL_TABLET | Freq: Every day | ORAL | Status: DC
  Administered 2018-07-11 – 2018-07-14 (×4): 12.5 mg via ORAL
  Filled 2018-07-11 (×4): qty 1

## 2018-07-11 MED ORDER — FOLIC ACID 1 MG PO TABS
1.0000 mg | ORAL_TABLET | Freq: Every day | ORAL | Status: DC
  Administered 2018-07-11 – 2018-07-14 (×4): 1 mg via ORAL
  Filled 2018-07-11 (×4): qty 1

## 2018-07-11 MED ORDER — ASPIRIN EC 81 MG PO TBEC
81.0000 mg | DELAYED_RELEASE_TABLET | Freq: Every day | ORAL | Status: DC
  Administered 2018-07-11 – 2018-07-14 (×4): 81 mg via ORAL
  Filled 2018-07-11 (×4): qty 1

## 2018-07-11 MED ORDER — VENLAFAXINE HCL ER 150 MG PO CP24
150.0000 mg | ORAL_CAPSULE | Freq: Every day | ORAL | Status: DC
  Administered 2018-07-12 – 2018-07-14 (×3): 150 mg via ORAL
  Filled 2018-07-11 (×4): qty 1

## 2018-07-11 MED ORDER — GABAPENTIN 800 MG PO TABS
800.0000 mg | ORAL_TABLET | Freq: Three times a day (TID) | ORAL | Status: DC
  Filled 2018-07-11 (×2): qty 1

## 2018-07-11 MED ORDER — CYANOCOBALAMIN 1000 MCG/ML IJ SOLN
1000.0000 ug | Freq: Once | INTRAMUSCULAR | Status: AC
  Administered 2018-07-11: 1000 ug via SUBCUTANEOUS
  Filled 2018-07-11: qty 1

## 2018-07-11 MED ORDER — LEVOTHYROXINE SODIUM 100 MCG PO TABS
100.0000 ug | ORAL_TABLET | Freq: Every day | ORAL | Status: DC
  Administered 2018-07-11 – 2018-07-14 (×4): 100 ug via ORAL
  Filled 2018-07-11 (×4): qty 1

## 2018-07-11 NOTE — Progress Notes (Signed)
[facility] Progress Note  Patient: [name] CLE:[id number]   PCP: Shirline Frees, MD DOB: [birth year]   DOA: 07/09/2018   DOS: 07/11/2018   Date of Service: the patient was seen and examined on 07/11/2018  Brief hospital course: Pt. with PMH of ischemic cardiomyopathy with EF in the 15 to 20% range status post biventricular pacemaker, CAD with stent, anemia, hyperlipidemia, GERD; admitted on 07/09/2018, presented with complaint of shortnes of breath, was found to have  Acute on chronic systolic CHF. Currently further plan is continue diuresis.  Subjective: Patient is more awake, following all the commands.  Denies any acute complaint.  Breathing better.  No nausea no vomiting no fever no chills no chest pain.  Assessment and Plan: Acute hypoxic respiratory failure secondary to acute exacerbation of chronic systolic congestive heart failure History of chronic systolic congestive heart failure (EF 15 to 20%) secondary to ischemic cardiomyopathy status post biventricular ICD.  SPO2 87% on room air on admission currently on room air  Chest x-ray showing chronic cardiomegaly and diffuse interstitial pulmonary opacities likely representing pulmonary edema.  -Received IV Lasix 40 mg in the ED.  still volume overloaded, Continue IV Lasix 40 mg daily -Monitor intake and output, daily weights, low-sodium diet with fluid restriction -Supplemental oxygen -Although pulmonary opacities seen on x-ray likely represent pulmonary edema given elevated BNP  Rule out COVID-19 Presented with Cough SHORTNES OF BREATH, diarrhea and confusion  no Travel no Sick contacts  Influenza PCR , RVP not sent No leukopenia, lymphopenia CT chest: GGO, consolidation  Plan: Supportive measures  Date: 07/11/2018 1:48 PM  Patient Isolation: Droplet+Contact HCP PPE: Wearing all recommended PPE PAPR + eye protection + Gown + Gloves + Surgical Cap Patient PPE: none   Acute encephalopathy Patient was very  somnolent and lethargic on presentation. Oriented to person only.  No history of dementia listed in the chart. On 07/10/2018 At the time of my evaluation the patient was unresponsive to sternal rub.  Later on when the RN was interacting the patient she was communicating with her and following all the commands. ABG shows no hypercarbia. - Head CT shows no abnormality - Normal ammonia level, TSH, B12 - negative UDS x2. On 4/12 shows some improvement, still mildly confused. Does not remember yesterday's events.   History of chronic pain as well as chronic anxiety. Patient is on chronic OxyContin as well as Ativan. Her UDS is negative twice. Concerning for patient not using her medicine or diverging.  Chronic anemia -Hemoglobin 10.9, no recent baseline.  No signs of active bleeding at this time. -Anemia panel  Elevated LFTs AST 84, ALT 70, alk phos 134.  T bili normal.  Question whether related to underlying viral infection.  Abdominal exam benign. -Repeat hepatic function panel in a.m. normal  History of CAD status post stent -Patient not complaining of chest pain.  Troponin negative.  EKG with paced rhythm.  Tobacco use -NicoDerm patch  Diet: Cardiac diet DVT Prophylaxis: subcutaneous Heparin  Advance goals of care discussion: Full code  Family Communication: no family was present at bedside, at the time of interview.   Disposition:  Discharge to home.  Consultants: none Procedures: none  Scheduled Meds: . aspirin EC  81 mg Oral Daily  . carbamazepine  100 mg Oral BID  . carvedilol  3.125 mg Oral BID WC  . enoxaparin (LOVENOX) injection  40 mg Subcutaneous Q24H  . folic acid  1 mg Oral Daily  . [START ON 07/12/2018] furosemide  40 mg Intravenous Daily  . gabapentin  800 mg Oral TID  . iron polysaccharides  150 mg Oral Daily  . levothyroxine  100 mcg Oral Daily  . mirtazapine  7.5 mg Oral QHS  . nicotine  14 mg Transdermal Daily  . potassium chloride  40 mEq Oral  Daily  . ramipril  2.5 mg Oral Daily  . simvastatin  10 mg Oral Daily  . spironolactone  12.5 mg Oral Daily  . [START ON 07/12/2018] venlafaxine XR  150 mg Oral Q breakfast   Continuous Infusions: PRN Meds:  Antibiotics: Anti-infectives (From admission, onward)   None       Objective: Physical Exam: Vitals:   07/11/18 0912 07/11/18 1012 07/11/18 1112 07/11/18 1202  BP: 126/65 133/79 111/68 111/68  Pulse: 72 64 69   Resp: _0 Temp:      TempSrc:      SpO2: 92% 94% 99%   Weight:        Intake/Output Summary (Last 24 hours) at 07/11/2018 1339 Last data filed at 07/11/2018 1300 Gross per 24 hour  Intake 240 ml  Output 1101 ml  Net -861 ml   Filed Weights   07/11/18 0346  Weight: 71.5 kg   General: Alert, Awake and Oriented to Time, Place and Person. Appear in mild distress, affect appropriate Eyes: PERRL, Conjunctiva normal ENT: Oral Mucosa clear moist. Neck: no JVD, no Abnormal Mass Or lumps Cardiovascular: S1 and S2 Present, no Murmur, Peripheral Pulses Present Respiratory: normal respiratory effort, Bilateral Air entry equal and Decreased, no use of accessory muscle, bilateral  Crackles, no wheezes Abdomen: Bowel Sound present, Soft and no tenderness, no hernia Skin: no redness, no Rash, no induration Extremities: bilateral  Pedal edema, no calf tenderness Neurologic: Grossly no focal neuro deficit. Bilaterally Equal motor strength  Data Reviewed: CBC: Recent Labs  Lab 07/10/18 0000 07/10/18 0313  WBC 7.8  --   HGB 10.9* 11.2*  HCT 35.7* 33.0*  MCV 97.3  --   PLT 253  --    Basic Metabolic Panel: Recent Labs  Lab 07/10/18 0000 07/10/18 0313 07/11/18 0525  NA 137 139 139  K 4.3 4.8 3.4*  CL 107  --  101  CO2 19*  --  26  GLUCOSE 210*  --  97  BUN 15  --  15  CREATININE 0.79  --  0.85  CALCIUM 9.1  --  9.1    Liver Function Tests: Recent Labs  Lab 07/10/18 0000 07/11/18 0525  AST 84* 27  ALT 70* 42  ALKPHOS 134* 114  BILITOT 0.6  0.7  PROT 6.9 6.1*  ALBUMIN 3.5 3.1*   Recent Labs  Lab 07/10/18 0000  LIPASE 21   Recent Labs  Lab 07/10/18 0544  AMMONIA 23   Coagulation Profile: No results for input(s): INR, PROTIME in the last 168 hours. Cardiac Enzymes: Recent Labs  Lab 07/10/18 0000  TROPONINI <0.03   BNP (last 3 results) No results for input(s): PROBNP in the last 8760 hours. CBG: No results for input(s): GLUCAP in the last 168 hours. Studies: No results found.   Time spent: 35 minutes  Author: Berle Mull, MD [facility] 07/11/2018 1:39 PM  To reach On-call, see care teams to locate the attending and reach out to them via [email or web address]. If 7PM-7AM, please contact night-coverage If you still have difficulty reaching the attending provider, please page the [facility] (Director on Call) for [facility] on amion for assistance.

## 2018-07-11 NOTE — Progress Notes (Signed)
Patient's sister Kendrick Fries called, ph [phone], states she does not want her sister to know she that she is giving this information, but she has concerns about her returning home with her husband. States patient has had multiple fall recently and that she gets increasingly confused at the end of the day.  She states that patient's husband does not always give her medication.  Will pass information on in report for Case Management, MD and will order PT/OT eval.

## 2018-07-12 ENCOUNTER — Other Ambulatory Visit: Payer: Medicare Other

## 2018-07-12 LAB — CBC WITH DIFFERENTIAL/PLATELET
Abs Immature Granulocytes: 0.02 10*3/uL (ref 0.00–0.07)
Basophils Absolute: 0 10*3/uL (ref 0.0–0.1)
Basophils Relative: 0 %
Eosinophils Absolute: 0.1 10*3/uL (ref 0.0–0.5)
Eosinophils Relative: 1 %
HCT: 35.1 % — ABNORMAL LOW (ref 36.0–46.0)
Hemoglobin: 11 g/dL — ABNORMAL LOW (ref 12.0–15.0)
Immature Granulocytes: 0 %
Lymphocytes Relative: 21 %
Lymphs Abs: 1.4 10*3/uL (ref 0.7–4.0)
MCH: 29.7 pg (ref 26.0–34.0)
MCHC: 31.3 g/dL (ref 30.0–36.0)
MCV: 94.9 fL (ref 80.0–100.0)
Monocytes Absolute: 0.9 10*3/uL (ref 0.1–1.0)
Monocytes Relative: 14 %
Neutro Abs: 4.2 10*3/uL (ref 1.7–7.7)
Neutrophils Relative %: 64 %
Platelets: 235 10*3/uL (ref 150–400)
RBC: 3.7 MIL/uL — ABNORMAL LOW (ref 3.87–5.11)
RDW: 15.3 % (ref 11.5–15.5)
WBC: 6.7 10*3/uL (ref 4.0–10.5)
nRBC: 0 % (ref 0.0–0.2)

## 2018-07-12 LAB — COMPREHENSIVE METABOLIC PANEL
ALT: 32 U/L (ref 0–44)
AST: 19 U/L (ref 15–41)
Albumin: 3 g/dL — ABNORMAL LOW (ref 3.5–5.0)
Alkaline Phosphatase: 115 U/L (ref 38–126)
Anion gap: 10 (ref 5–15)
BUN: 13 mg/dL (ref 8–23)
CO2: 27 mmol/L (ref 22–32)
Calcium: 8.9 mg/dL (ref 8.9–10.3)
Chloride: 103 mmol/L (ref 98–111)
Creatinine, Ser: 0.81 mg/dL (ref 0.44–1.00)
GFR calc Af Amer: >60 mL/min (ref >60)
GFR calc non Af Amer: >60 mL/min (ref >60)
Glucose, Bld: 96 mg/dL (ref 70–99)
Potassium: 3.7 mmol/L (ref 3.5–5.1)
Sodium: 140 mmol/L (ref 135–145)
Total Bilirubin: 0.6 mg/dL (ref 0.3–1.2)
Total Protein: 6 g/dL — ABNORMAL LOW (ref 6.5–8.1)

## 2018-07-12 LAB — NOVEL CORONAVIRUS, NAA (HOSP ORDER, SEND-OUT TO REF LAB; TAT 18-24 HRS): SARS-CoV-2, NAA: NOT DETECTED

## 2018-07-12 MED ORDER — FOLIC ACID 1 MG PO TABS: 1.0000 mg | ORAL_TABLET | Freq: Every day | ORAL | 0 refills | Status: AC

## 2018-07-12 MED ORDER — ACETAMINOPHEN 325 MG PO TABS
650.0000 mg | ORAL_TABLET | Freq: Four times a day (QID) | ORAL | Status: DC | PRN
  Administered 2018-07-14: 650 mg via ORAL
  Filled 2018-07-12: qty 2

## 2018-07-12 MED ORDER — GABAPENTIN 400 MG PO CAPS: 800.0000 mg | ORAL_CAPSULE | Freq: Three times a day (TID) | ORAL | Status: DC

## 2018-07-12 MED ORDER — LOPERAMIDE HCL 2 MG PO CAPS: 2.0000 mg | ORAL_CAPSULE | ORAL | Status: DC | PRN

## 2018-07-12 MED ORDER — DOCUSATE SODIUM 100 MG PO CAPS: 100.0000 mg | ORAL_CAPSULE | Freq: Two times a day (BID) | ORAL | Status: DC

## 2018-07-12 MED ORDER — NICOTINE 14 MG/24HR TD PT24: 14.0000 mg | MEDICATED_PATCH | Freq: Every day | TRANSDERMAL | 0 refills | Status: DC

## 2018-07-12 MED ORDER — LOPERAMIDE HCL 2 MG PO CAPS
2.0000 mg | ORAL_CAPSULE | Freq: Once | ORAL | Status: AC
  Administered 2018-07-12: 2 mg via ORAL
  Filled 2018-07-12: qty 1

## 2018-07-12 NOTE — Progress Notes (Signed)
Attempted to reach patient in room over the phone without success even after asking the staff to make sure phone in reach.  CM attempted to speak to spouse without success.  CM did speak to pts sister and she prefers the patient d/c to SNF for rehab. CM will attempt to speak to pt again in am.

## 2018-07-12 NOTE — Evaluation (Signed)
Physical Therapy Evaluation Patient Details Name: [name] MRN: [id number] DOB: 1945-05-12 Today's Date: 07/12/2018   History of Present Illness  73 yo admitted with SOB with acute on chronic CHF, covid rule out. PMhx: ICM, pacemaker, CAD, anemia, HLD, GERD  Clinical Impression  Pt pleasant and eager to get breakfast. PT states she likes to eat but doesn't cook or drive, they order take out or delivery and spouse will pick it up. PT states she normally walks in the house and will go to the store with spouse. Pt unable to state president or month even though she was aware of Easter. Pt with significant balance and cognitive deficits who will benefit from acute therapy to maximize mobility, safety and function. Per chart daughter concerned over falls and confusion at home and if spouse is not a sufficient caregiver then SNF recommended. Will continue to follow acutely. PT with noted desaturation to 89% on RA with limited in room gait.      Follow Up Recommendations Home health PT;Supervision/Assistance - 24 hour    Equipment Recommendations  None recommended by PT    Recommendations for Other Services       Precautions / Restrictions Precautions Precautions: Fall Precaution Comments: watch sats      Mobility  Bed Mobility Overal bed mobility: Modified Independent                Transfers Overall transfer level: Needs assistance   Transfers: Sit to/from Stand Sit to Stand: Min guard         General transfer comment: guarding for safety from bed  Ambulation/Gait Ambulation/Gait assistance: Min assist Gait Distance (Feet): 50 Feet Assistive device: None Gait Pattern/deviations: Decreased stride length;Drifts right/left   Gait velocity interpretation: 1.31 - 2.62 ft/sec, indicative of limited community ambulator General Gait Details: pt with several LOB with walking across room with need for MIn assist to steady during gait. Pt reaching for environmental  support but unable to verbalize deficits and need for assist. RW recommended for all gait. Pt sOB end of gait with SpO2 dropping to 89% with cues for breathing technique  Stairs            Wheelchair Mobility    Modified Rankin (Stroke Patients Only)       Balance Overall balance assessment: Needs assistance;History of Falls Sitting-balance support: No upper extremity supported;Feet supported Sitting balance-Leahy Scale: Fair       Standing balance-Leahy Scale: Poor Standing balance comment: pt with multiple LOB during gait, reaching for environmental support                             Pertinent Vitals/Pain Pain Assessment: No/denies pain    Home Living Family/patient expects to be discharged to:: Private residence Living Arrangements: Spouse/significant other Available Help at Discharge: Family;Available 24 hours/day Type of Home: Apartment Home Access: Stairs to enter   Entrance Stairs-Number of Steps: 3 Home Layout: One level Home Equipment: Walker - 2 wheels;Cane - single point;Bedside commode;Shower seat Additional Comments: pt reports she uses cane or RW at times, does not drive    Prior Function Level of Independence: Independent with assistive device(s)         Comments: pt reports spouse drives and that she cares for herself, does endorse multiple falls in the last year     Hand Dominance   Dominant Hand: Right    Extremity/Trunk Assessment   Upper Extremity Assessment Upper Extremity Assessment:  Defer to OT evaluation    Lower Extremity Assessment Lower Extremity Assessment: Generalized weakness    Cervical / Trunk Assessment Cervical / Trunk Assessment: Kyphotic  Communication   Communication: No difficulties  Cognition Arousal/Alertness: Awake/alert Behavior During Therapy: WFL for tasks assessed/performed Overall Cognitive Status: Impaired/Different from baseline Area of Impairment: Orientation;Memory;Safety/judgement                  Orientation Level: Time   Memory: Decreased short-term memory   Safety/Judgement: Decreased awareness of deficits;Decreased awareness of safety     General Comments: pt una      General Comments      Exercises     Assessment/Plan    PT Assessment Patient needs continued PT services  PT Problem List Decreased activity tolerance;Decreased mobility;Decreased strength;Decreased balance;Decreased cognition;Decreased knowledge of use of DME;Decreased safety awareness       PT Treatment Interventions Gait training;Therapeutic activities;Stair training;Therapeutic exercise;Cognitive remediation;Neuromuscular re-education;DME instruction;Functional mobility training;Balance training;Patient/family education    PT Goals (Current goals can be found in the Care Plan section)  Acute Rehab PT Goals Patient Stated Goal: return home, eat PT Goal Formulation: With patient Time For Goal Achievement: 07/26/18 Potential to Achieve Goals: Fair    Frequency Min 3X/week   Barriers to discharge Decreased caregiver support family not present to confirm spouse ability    Co-evaluation PT/OT/SLP Co-Evaluation/Treatment: Yes Reason for Co-Treatment: For patient/therapist safety;To address functional/ADL transfers PT goals addressed during session: Mobility/safety with mobility;Balance         AM-PAC PT "6 Clicks" Mobility  Outcome Measure Help needed turning from your back to your side while in a flat bed without using bedrails?: None Help needed moving from lying on your back to sitting on the side of a flat bed without using bedrails?: None Help needed moving to and from a bed to a chair (including a wheelchair)?: A Little Help needed standing up from a chair using your arms (e.g., wheelchair or bedside chair)?: A Little Help needed to walk in hospital room?: A Little Help needed climbing 3-5 steps with a railing? : A Lot 6 Click Score: 19    End of Session  Equipment Utilized During Treatment: Gait belt Activity Tolerance: Patient tolerated treatment well Patient left: in chair;with call bell/phone within reach;with chair alarm set Nurse Communication: Mobility status PT Visit Diagnosis: Other abnormalities of gait and mobility (R26.89);Muscle weakness (generalized) (M62.81);Difficulty in walking, not elsewhere classified (R26.2)    Time: 0539-7673 PT Time Calculation (min) (ACUTE ONLY): 28 min   Charges:   PT Evaluation $PT Eval Moderate Complexity: 1 Mod          [place], PT Acute Rehabilitation Services Pager: [phone] Office: [phone]   Cristian Grieves B Christa Fasig 07/12/2018, 10:14 AM

## 2018-07-12 NOTE — Evaluation (Addendum)
Occupational Therapy Evaluation Patient Details Name: [name] MRN: [id number] DOB: March 18, 1946 Today's Date: 07/12/2018    History of Present Illness 73 yo admitted with SOB with acute on chronic CHF, covid suspicion. PMhx: ICM, pacemaker, CAD, anemia, HLD, GERD   Clinical Impression   PTA, pt reports she was living with her husband and performs BADLs; husband performs IADLs and driving. Pt currently requiring Min Guard-Min A for LB ADLs and functional mobility. Pt performing grooming at sink with Min guard A. Pt performing functional mobility in room with Min Guard-Min A for decreased balance. Pt presenting with decreased balance and cognition impacting her safety and functional performance. Pt with decreased orientation. She was able to state Easter happened this weekend, but then stated it was May. Also unable to state current present. Pt would benefit from further acute OT to facilitate safe dc. Recommend dc to home with HHOT to increased safety and independence with ADLs and functional mobility.   SpO2 100% on RA in bed. Dropping to 89% during activity on RA    Follow Up Recommendations  Home health OT;Supervision/Assistance - 24 hour    Equipment Recommendations  None recommended by OT    Recommendations for Other Services PT consult     Precautions / Restrictions Precautions Precautions: Fall Precaution Comments: watch stats Restrictions Weight Bearing Restrictions: No      Mobility Bed Mobility Overal bed mobility: Modified Independent                Transfers Overall transfer level: Needs assistance Equipment used: None Transfers: Sit to/from Stand Sit to Stand: Min guard         General transfer comment: guarding for safety from bed    Balance Overall balance assessment: Needs assistance;History of Falls Sitting-balance support: No upper extremity supported;Feet supported Sitting balance-Leahy Scale: Fair       Standing balance-Leahy  Scale: Poor Standing balance comment: pt with multiple LOB during gait, reaching for environmental support                           ADL either performed or assessed with clinical judgement   ADL Overall ADL's : Needs assistance/impaired Eating/Feeding: Independent;Sitting   Grooming: Min guard;Wash/dry hands;Standing   Upper Body Bathing: Min guard;Sitting   Lower Body Bathing: Min guard;Sit to/from stand;Minimal assistance   Upper Body Dressing : Min guard;Sitting   Lower Body Dressing: Min guard;Sit to/from stand;Minimal assistance Lower Body Dressing Details (indicate cue type and reason): Pt donning socks long sitting in bed.Min A for balance with dynamic standing Toilet Transfer: Min guard;Minimal assistance;Ambulation(simulated to recliner)           Functional mobility during ADLs: Min guard;Minimal assistance General ADL Comments: Pt presenting with decreased balance and cognition     Vision         Perception     Praxis      Pertinent Vitals/Pain Pain Assessment: No/denies pain     Hand Dominance Right   Extremity/Trunk Assessment Upper Extremity Assessment Upper Extremity Assessment: Generalized weakness   Lower Extremity Assessment Lower Extremity Assessment: Defer to PT evaluation   Cervical / Trunk Assessment Cervical / Trunk Assessment: Kyphotic   Communication Communication Communication: No difficulties   Cognition Arousal/Alertness: Awake/alert Behavior During Therapy: WFL for tasks assessed/performed Overall Cognitive Status: Impaired/Different from baseline Area of Impairment: Orientation;Memory;Safety/judgement                 Orientation Level: Time  Memory: Decreased short-term memory   Safety/Judgement: Decreased awareness of deficits;Decreased awareness of safety     General Comments: Pt unable to state month and current president. Pt following commands and reporting all home set up.    General Comments   SpO2 100% on RA in bed. dropping to 89% on RA with activity in room    Exercises     Shoulder Instructions      Home Living Family/patient expects to be discharged to:: Private residence Living Arrangements: Spouse/significant other Available Help at Discharge: Family;Available 24 hours/day Type of Home: Apartment Home Access: Stairs to enter Entrance Stairs-Number of Steps: 3   Home Layout: One level     Bathroom Shower/Tub: Teacher, early years/pre: Standard     Home Equipment: Environmental consultant - 2 wheels;Cane - single point;Bedside commode;Shower seat   Additional Comments: pt reports she uses cane or RW at times, does not drive. Husband does all IADLs      Prior Functioning/Environment Level of Independence: Independent with assistive device(s)        Comments: pt reports spouse drives and that she cares for herself, does endorse multiple falls in the last year        OT Problem List: Decreased strength;Decreased range of motion;Decreased activity tolerance;Impaired balance (sitting and/or standing);Decreased cognition;Decreased safety awareness;Decreased knowledge of use of DME or AE;Decreased knowledge of precautions      OT Treatment/Interventions: Self-care/ADL training;Therapeutic exercise;Energy conservation;DME and/or AE instruction;Therapeutic activities;Patient/family education    OT Goals(Current goals can be found in the care plan section) Acute Rehab OT Goals Patient Stated Goal: "I like to eat" OT Goal Formulation: With patient Time For Goal Achievement: 07/26/18 Potential to Achieve Goals: Good  OT Frequency: Min 2X/week   Barriers to D/C:            Co-evaluation PT/OT/SLP Co-Evaluation/Treatment: Yes Reason for Co-Treatment: For patient/therapist safety;Complexity of the patient's impairments (multi-system involvement) PT goals addressed during session: Mobility/safety with mobility;Balance OT goals addressed during session: ADL's and  self-care      AM-PAC OT "6 Clicks" Daily Activity     Outcome Measure Help from another person eating meals?: None Help from another person taking care of personal grooming?: A Little Help from another person toileting, which includes using toliet, bedpan, or urinal?: A Little Help from another person bathing (including washing, rinsing, drying)?: A Little Help from another person to put on and taking off regular upper body clothing?: A Little Help from another person to put on and taking off regular lower body clothing?: A Little 6 Click Score: 19   End of Session Equipment Utilized During Treatment: Gait belt Nurse Communication: Mobility status  Activity Tolerance: Patient tolerated treatment well Patient left: in chair;with call bell/phone within reach;with chair alarm set  OT Visit Diagnosis: Unsteadiness on feet (R26.81);Other abnormalities of gait and mobility (R26.89);Muscle weakness (generalized) (M62.81);Other symptoms and signs involving cognitive function                Time: 1443-1540 OT Time Calculation (min): 28 min Charges:  OT General Charges $OT Visit: 1 Visit OT Evaluation $OT Eval Low Complexity: [place], OTR/L Acute Rehab Pager: [phone] Office: [place] 07/12/2018, 11:36 AM

## 2018-07-12 NOTE — Progress Notes (Addendum)
[facility] Progress Note  Patient: [name] AVW:[id number]   PCP: Shirline Frees, MD DOB: 1945-05-14   DOA: 07/09/2018   DOS: 07/12/2018   Date of Service: the patient was seen and examined on 07/12/2018  Brief hospital course: Pt. with PMH of ischemic cardiomyopathy with EF in the 15 to 20% range status post biventricular pacemaker, CAD with stent, anemia, hyperlipidemia, GERD; admitted on 07/09/2018, presented with complaint of shortnes of breath, was found to have  Acute on chronic systolic CHF. Currently further plan is continue diuresis.  Subjective: Reports to have 6-7 episodes of bowel movement with loose watery diarrhea.  No abdominal pain no nausea no vomiting.  Oral intake is adequate.  No fever no chills.  Assessment and Plan: Acute hypoxic respiratory failure secondary to acute exacerbation of chronic systolic congestive heart failure History of chronic systolic congestive heart failure (EF 15 to 20%) secondary to ischemic cardiomyopathy status post biventricular ICD.  SPO2 87% on room air on admission currently on room air  Chest x-ray showing chronic cardiomegaly and diffuse interstitial pulmonary opacities likely representing pulmonary edema. CT chest showed pulmonary edema as well. -Received IV Lasix 40 mg every 12 hours.  Transition to IV Lasix 40 mg daily.  We will give her oral tomorrow. -Monitor intake and output, daily weights, low-sodium diet with fluid restriction -Supplemental oxygen  Negative COVID-19, ruled out Presented with Cough SHORTNES OF BREATH, diarrhea and confusion  no Travel no Sick contacts  Influenza PCR , RVP not sent No leukopenia, lymphopenia CT chest: GGO, consolidation  Plan: Supportive measures With infectious disease Dr. Drucilla Schmidt, patient remains low pretest probability and is actually responding to IV diuresis and has an alternative diagnosis with her presentation.  Currently we will discontinue current isolation precautions.   Date: 07/12/2018 6:21 PM  Patient Isolation: Droplet+Contact HCP PPE: Wearing all recommended PPE PAPR + eye protection + Gown + Gloves + Surgical Cap Patient PPE: none   Acute encephalopathy Patient was very somnolent and lethargic on presentation. Oriented to person only.  No history of dementia listed in the chart. On 07/10/2018 At the time of my evaluation the patient was unresponsive to sternal rub.  Later on when the RN was interacting the patient she was communicating with her and following all the commands. ABG shows no hypercarbia. - Head CT shows no abnormality - Normal ammonia level, TSH, B12 - negative UDS x2.  History of chronic pain as well as chronic anxiety. Patient is on chronic OxyContin as well as Ativan. Her UDS is negative twice. Concerning for patient not using her medicine or diverging.  Chronic anemia -Hemoglobin 10.9, no recent baseline.  No signs of active bleeding at this time. -Anemia panel  Elevated LFTs AST 84, ALT 70, alk phos 134.  T bili normal.  Question whether related to underlying viral infection.  Abdominal exam benign. -Repeat hepatic function panel in a.m. normal  History of CAD status post stent -Patient not complaining of chest pain.  Troponin negative.  EKG with paced rhythm.  Tobacco use -NicoDerm patch  Social situation. On consultation to case management patient does not appear to have safe discharge plan.  Case management and social worker both involved on the case.  Will monitor the outcome.  Antibiotic associated diarrhea. Patient was given some antibiotics initially on presentation. She does continues to have some diarrhea right now. Responded well to Imodium. Does not have any abdominal pain nausea vomiting or leukocytosis to suggest any possibility of infectious or is  not at high risk for C. difficile.  Diet: Cardiac diet DVT Prophylaxis: subcutaneous Heparin  Advance goals of care discussion: Full code  Family  Communication: no family was present at bedside, at the time of interview.   Disposition:  Discharge to be determined.  Consultants: none Procedures: none  Scheduled Meds: . aspirin EC  81 mg Oral Daily  . carbamazepine  100 mg Oral BID  . carvedilol  3.125 mg Oral BID WC  . enoxaparin (LOVENOX) injection  40 mg Subcutaneous Q24H  . folic acid  1 mg Oral Daily  . furosemide  40 mg Intravenous Daily  . gabapentin  800 mg Oral TID  . iron polysaccharides  150 mg Oral Daily  . levothyroxine  100 mcg Oral Daily  . mirtazapine  7.5 mg Oral QHS  . nicotine  14 mg Transdermal Daily  . potassium chloride  40 mEq Oral Daily  . ramipril  2.5 mg Oral Daily  . simvastatin  10 mg Oral Daily  . spironolactone  12.5 mg Oral Daily  . venlafaxine XR  150 mg Oral Q breakfast   Continuous Infusions: PRN Meds:  Antibiotics: Anti-infectives (From admission, onward)   None       Objective: Physical Exam: Vitals:   07/12/18 0600 07/12/18 0736 07/12/18 0959 07/12/18 1607  BP:  104/62  104/62  Pulse: 60 67  82  Resp: 12 13    Temp:  98.7 F (37.1 C)    TempSrc:  Oral    SpO2: 100% 97% 93%   Weight:      Height:        Intake/Output Summary (Last 24 hours) at 07/12/2018 1821 Last data filed at 07/12/2018 0522 Gross per 24 hour  Intake 90 ml  Output -  Net 90 ml   Filed Weights   07/11/18 0346 07/12/18 0500  Weight: 71.5 kg 69.1 kg   General: Alert, Awake and Oriented to Time, Place and Person. Appear in mild distress, affect appropriate Eyes: PERRL, Conjunctiva normal ENT: Oral Mucosa clear moist. Neck: no JVD, no Abnormal Mass Or lumps Cardiovascular: S1 and S2 Present, no Murmur, Peripheral Pulses Present Respiratory: normal respiratory effort, Bilateral Air entry equal and Decreased, no use of accessory muscle, bilateral  Crackles, no wheezes Abdomen: Bowel Sound present, Soft and no tenderness, no hernia Skin: no redness, no Rash, no induration Extremities: bilateral   Pedal edema, no calf tenderness Neurologic: Grossly no focal neuro deficit. Bilaterally Equal motor strength  Data Reviewed: CBC: Recent Labs  Lab 07/10/18 0000 07/10/18 0313 07/12/18 0746  WBC 7.8  --  6.7  NEUTROABS  --   --  4.2  HGB 10.9* 11.2* 11.0*  HCT 35.7* 33.0* 35.1*  MCV 97.3  --  94.9  PLT 253  --  025   Basic Metabolic Panel: Recent Labs  Lab 07/10/18 0000 07/10/18 0313 07/11/18 0525 07/12/18 0746  NA 137 139 139 140  K 4.3 4.8 3.4* 3.7  CL 107  --  101 103  CO2 19*  --  26 27  GLUCOSE 210*  --  97 96  BUN 15  --  15 13  CREATININE 0.79  --  0.85 0.81  CALCIUM 9.1  --  9.1 8.9    Liver Function Tests: Recent Labs  Lab 07/10/18 0000 07/11/18 0525 07/12/18 0746  AST 84* 27 19  ALT 70* 42 32  ALKPHOS 134* 114 115  BILITOT 0.6 0.7 0.6  PROT 6.9 6.1* 6.0*  ALBUMIN 3.5  3.1* 3.0*   Recent Labs  Lab 07/10/18 0000  LIPASE 21   Recent Labs  Lab 07/10/18 0544  AMMONIA 23   Coagulation Profile: No results for input(s): INR, PROTIME in the last 168 hours. Cardiac Enzymes: Recent Labs  Lab 07/10/18 0000  TROPONINI <0.03   BNP (last 3 results) No results for input(s): PROBNP in the last 8760 hours. CBG: No results for input(s): GLUCAP in the last 168 hours. Studies: No results found.   Time spent: 35 minutes  Author: Berle Mull, MD [facility] 07/12/2018 6:21 PM  To reach On-call, see care teams to locate the attending and reach out to them via [email or web address]. If 7PM-7AM, please contact night-coverage If you still have difficulty reaching the attending provider, please page the [facility] (Director on Call) for [facility] on amion for assistance.

## 2018-07-13 ENCOUNTER — Inpatient Hospital Stay (HOSPITAL_COMMUNITY): Payer: Medicare Other

## 2018-07-13 ENCOUNTER — Encounter: Payer: Self-pay | Admitting: Cardiology

## 2018-07-13 DIAGNOSIS — I361 Nonrheumatic tricuspid (valve) insufficiency: Secondary | ICD-10-CM

## 2018-07-13 DIAGNOSIS — I340 Nonrheumatic mitral (valve) insufficiency: Secondary | ICD-10-CM

## 2018-07-13 LAB — BASIC METABOLIC PANEL
Anion gap: 10 (ref 5–15)
BUN: 14 mg/dL (ref 8–23)
CO2: 24 mmol/L (ref 22–32)
Calcium: 9 mg/dL (ref 8.9–10.3)
Chloride: 107 mmol/L (ref 98–111)
Creatinine, Ser: 0.86 mg/dL (ref 0.44–1.00)
GFR calc Af Amer: >60 mL/min (ref >60)
GFR calc non Af Amer: >60 mL/min (ref >60)
Glucose, Bld: 95 mg/dL (ref 70–99)
Potassium: 3.9 mmol/L (ref 3.5–5.1)
Sodium: 141 mmol/L (ref 135–145)

## 2018-07-13 LAB — ECHOCARDIOGRAM LIMITED
Height: 63 in
Weight: 2400 oz

## 2018-07-13 LAB — MAGNESIUM: Magnesium: 2.1 mg/dL (ref 1.7–2.4)

## 2018-07-13 MED ORDER — GABAPENTIN 400 MG PO CAPS
400.0000 mg | ORAL_CAPSULE | Freq: Three times a day (TID) | ORAL | Status: DC
  Administered 2018-07-13 – 2018-07-14 (×3): 400 mg via ORAL
  Filled 2018-07-13 (×3): qty 1
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
## 2018-07-13 NOTE — Progress Notes (Signed)
Remote ICD transmission.   
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
## 2018-07-13 NOTE — Progress Notes (Signed)
[facility] Progress Note  Patient: [name] KPT:[id number]   PCP: Shirline Frees, MD DOB: 1945-05-14   DOA: 07/09/2018   DOS: 07/13/2018   Date of Service: the patient was seen and examined on 07/13/2018  Brief hospital course: Pt. with PMH of ischemic cardiomyopathy with EF in the 15 to 20% range status post biventricular pacemaker, CAD with stent, anemia, hyperlipidemia, GERD; admitted on 07/09/2018, presented with complaint of shortnes of breath, was found to have  Acute on chronic systolic CHF. -Given current pandemic she was also tested for novel coronavirus which was negative  Subjective:  -Feels better, breathing is improving.  Assessment and Plan:  Acute hypoxic respiratory failure  -Due to acute on chronic systolic CHF/ischemic cardiomyopathy  -Clinically improving with diuresis , has trace basilar crackles today continue IV Lasix 1 more day , change to Po tomorrow -Wean off O2  -Home tomorrow  -Ordered repeat echo, noted EF of 20%, which is unchanged from prior in 2015 , also notes restrictive filling, severe severe left ventricular enlargement, severe left atrial enlargement enlargement, moderate MR and mild pulmonary -Followed by Dr. Candee Furbish with [facility] heart care -Ruled out for Covid 19  Metabolic encephalopathy Patient was very somnolent and lethargic on presentation -Likely due to hypoxemia -Resolved, work-up unremarkable  History of chronic pain as well as chronic anxiety. Patient is on chronic OxyContin as well as Ativan. Her UDS is negative twice, this is concerning  Chronic anemia -hb stable  Elevated LFTs AST 84, ALT 70, alk phos 134.  T bili normal.  -Improved, likely secondary to improved passive hepatic congestion  History of CAD status post stent -Patient not complaining of chest pain.  Troponin negative.  EKG with paced rhythm.  Tobacco use -NicoDerm patch  Social situation. -CSW and Social work assisting with disposition   Diet: Cardiac diet DVT Prophylaxis: subcutaneous Heparin  Advance goals of care discussion: Full code  Family Communication: no family was present at bedside, at the time of interview.   Disposition: home tomorrow if stable  Consultants: none Procedures: none  Scheduled Meds: . aspirin EC  81 mg Oral Daily  . carbamazepine  100 mg Oral BID  . carvedilol  3.125 mg Oral BID WC  . enoxaparin (LOVENOX) injection  40 mg Subcutaneous Q24H  . folic acid  1 mg Oral Daily  . furosemide  40 mg Intravenous Daily  . gabapentin  400 mg Oral TID  . iron polysaccharides  150 mg Oral Daily  . levothyroxine  100 mcg Oral Daily  . mirtazapine  7.5 mg Oral QHS  . nicotine  14 mg Transdermal Daily  . potassium chloride  40 mEq Oral Daily  . ramipril  2.5 mg Oral Daily  . simvastatin  10 mg Oral Daily  . spironolactone  12.5 mg Oral Daily  . venlafaxine XR  150 mg Oral Q breakfast   Continuous Infusions: PRN Meds:  Antibiotics: Anti-infectives (From admission, onward)   None       Objective: Physical Exam: Vitals:   07/12/18 2059 07/13/18 0521 07/13/18 0906 07/13/18 1348  BP: 102/63 108/66  104/66  Pulse: 61 (!) 59 76 60  Resp: 16 15  12   Temp: 98.6 F (37 C) 97.6 F (36.4 C)  97.7 F (36.5 C)  TempSrc: Oral Oral  Oral  SpO2: 96% 95% 96% 96%  Weight:  68 kg    Height:        Intake/Output Summary (Last 24 hours) at 07/13/2018 1358 Last  data filed at 07/13/2018 1300 Gross per 24 hour  Intake 540 ml  Output 253 ml  Net 287 ml   Filed Weights   07/11/18 0346 07/12/18 0500 07/13/18 0521  Weight: 71.5 kg 69.1 kg 68 kg   Gen: Awake, Alert, Oriented X 3, no distress,frail HEENT: PERRLA, Neck supple, no JVD Lungs: fine basilar crackles CVS: RRR,No Gallops,Rubs or new Murmurs Abd: soft, Non tender, non distended, BS present Extremities: No edema Skin: no new rashes Neurologic: Grossly no focal neuro deficit. Bilaterally Equal motor strength  Data Reviewed: CBC: Recent  Labs  Lab 07/10/18 0000 07/10/18 0313 07/12/18 0746  WBC 7.8  --  6.7  NEUTROABS  --   --  4.2  HGB 10.9* 11.2* 11.0*  HCT 35.7* 33.0* 35.1*  MCV 97.3  --  94.9  PLT 253  --  015   Basic Metabolic Panel: Recent Labs  Lab 07/10/18 0000 07/10/18 0313 07/11/18 0525 07/12/18 0746 07/13/18 0711  NA 137 139 139 140 141  K 4.3 4.8 3.4* 3.7 3.9  CL 107  --  101 103 107  CO2 19*  --  26 27 24   GLUCOSE 210*  --  97 96 95  BUN 15  --  15 13 14   CREATININE 0.79  --  0.85 0.81 0.86  CALCIUM 9.1  --  9.1 8.9 9.0  MG  --   --   --   --  2.1    Liver Function Tests: Recent Labs  Lab 07/10/18 0000 07/11/18 0525 07/12/18 0746  AST 84* 27 19  ALT 70* 42 32  ALKPHOS 134* 114 115  BILITOT 0.6 0.7 0.6  PROT 6.9 6.1* 6.0*  ALBUMIN 3.5 3.1* 3.0*   Recent Labs  Lab 07/10/18 0000  LIPASE 21   Recent Labs  Lab 07/10/18 0544  AMMONIA 23   Coagulation Profile: No results for input(s): INR, PROTIME in the last 168 hours. Cardiac Enzymes: Recent Labs  Lab 07/10/18 0000  TROPONINI <0.03   BNP (last 3 results) No results for input(s): PROBNP in the last 8760 hours. CBG: No results for input(s): GLUCAP in the last 168 hours. Studies: No results found.   Time spent: 35 minutes  Author: Domenic Polite MD [facility] 07/13/2018 1:58 PM

## 2018-07-13 NOTE — Progress Notes (Signed)
Physical Therapy Treatment Patient Details Name: [name] MRN: [id number] DOB: 25-Oct-1945 Today's Date: 07/13/2018    History of Present Illness 73 yo admitted with SOB with acute on chronic CHF, covid (-). PMhx: ICM, pacemaker, CAD, anemia, HLD, GERD    PT Comments    Pt pleasant, alert, oriented and moving well this date with use of RW. PT with greatly improved activity tolerance and balance with RW present. Pt able to complete stairs and all mobility without physical assist and maintained SPO2. Return home with continued use of RW remains appropriate.    Follow Up Recommendations  Home health PT;Supervision for mobility/OOB     Equipment Recommendations  None recommended by PT    Recommendations for Other Services       Precautions / Restrictions Precautions Precautions: Fall    Mobility  Bed Mobility Overal bed mobility: Independent                Transfers Overall transfer level: Modified independent               General transfer comment: initial cues for hand placement not to pull up on RW and then able to demonstrate  Ambulation/Gait Ambulation/Gait assistance: Supervision Gait Distance (Feet): 250 Feet Assistive device: Rolling walker (2 wheeled) Gait Pattern/deviations: Step-through pattern;Decreased stride length   Gait velocity interpretation: >2.62 ft/sec, indicative of community ambulatory General Gait Details: pt moving well with steady gait with RW and able to control gait and rW well with SPO2 92-96% on RA throughout   Stairs Stairs: Yes Stairs assistance: Modified independent (Device/Increase time) Stair Management: Alternating pattern;Forwards;Two rails Number of Stairs: 4 General stair comments: pt completed safely with rails   Wheelchair Mobility    Modified Rankin (Stroke Patients Only)       Balance Overall balance assessment: Needs assistance;History of Falls Sitting-balance support: No upper extremity  supported;Feet supported Sitting balance-Leahy Scale: Good       Standing balance-Leahy Scale: Fair                              Cognition Arousal/Alertness: Awake/alert Behavior During Therapy: WFL for tasks assessed/performed Overall Cognitive Status: Within Functional Limits for tasks assessed                                 General Comments: pt oriented to all today, following all commands and answering questions appropriately      Exercises      General Comments        Pertinent Vitals/Pain Pain Assessment: No/denies pain    Home Living                      Prior Function            PT Goals (current goals can now be found in the care plan section) Progress towards PT goals: Progressing toward goals    Frequency           PT Plan Current plan remains appropriate    Co-evaluation              AM-PAC PT "6 Clicks" Mobility   Outcome Measure  Help needed turning from your back to your side while in a flat bed without using bedrails?: None Help needed moving from lying on your back to sitting on the side of a flat bed without  using bedrails?: None Help needed moving to and from a bed to a chair (including a wheelchair)?: None Help needed standing up from a chair using your arms (e.g., wheelchair or bedside chair)?: A Little Help needed to walk in hospital room?: A Little Help needed climbing 3-5 steps with a railing? : A Little 6 Click Score: 21    End of Session   Activity Tolerance: Patient tolerated treatment well Patient left: in chair;with call bell/phone within reach;with chair alarm set Nurse Communication: Mobility status PT Visit Diagnosis: Other abnormalities of gait and mobility (R26.89);Muscle weakness (generalized) (M62.81);Difficulty in walking, not elsewhere classified (R26.2)     Time: 1660-6301 PT Time Calculation (min) (ACUTE ONLY): 17 min  Charges:  $Gait Training: 8-22 mins                      [name] [name], PT Acute Rehabilitation Services Pager: [phone] Office: [phone]    [name] [name] 07/13/2018, 9:09 AM

## 2018-07-13 NOTE — Progress Notes (Signed)
  Echocardiogram 2D Echocardiogram has been performed.  [name] 07/13/2018, 11:32 AM

## 2018-07-14 ENCOUNTER — Ambulatory Visit: Payer: Medicare Other | Admitting: Endocrinology

## 2018-07-14 LAB — BASIC METABOLIC PANEL
Anion gap: 10 (ref 5–15)
BUN: 15 mg/dL (ref 8–23)
CO2: 27 mmol/L (ref 22–32)
Calcium: 9.2 mg/dL (ref 8.9–10.3)
Chloride: 105 mmol/L (ref 98–111)
Creatinine, Ser: 0.9 mg/dL (ref 0.44–1.00)
GFR calc Af Amer: >60 mL/min (ref >60)
GFR calc non Af Amer: >60 mL/min (ref >60)
Glucose, Bld: 91 mg/dL (ref 70–99)
Potassium: 4 mmol/L (ref 3.5–5.1)
Sodium: 142 mmol/L (ref 135–145)

## 2018-07-14 MED ORDER — FUROSEMIDE 80 MG PO TABS: 40.0000 mg | ORAL_TABLET | Freq: Two times a day (BID) | ORAL | 0 refills | Status: DC

## 2018-07-14 MED ORDER — GABAPENTIN 800 MG PO TABS: 400.0000 mg | ORAL_TABLET | Freq: Three times a day (TID) | ORAL | Status: AC

## 2018-07-14 MED ORDER — OXYCODONE HCL 10 MG PO TABS: 10.0000 mg | ORAL_TABLET | Freq: Two times a day (BID) | ORAL | 0 refills | Status: AC | PRN

## 2018-07-14 NOTE — Plan of Care (Signed)
  Problem: Clinical Measurements: Goal: Ability to maintain clinical measurements within normal limits will improve Outcome: Progressing   Problem: Clinical Measurements: Goal: Will remain free from infection Outcome: Progressing   Problem: Clinical Measurements: Goal: Diagnostic test results will improve Outcome: Progressing   Problem: Clinical Measurements: Goal: Respiratory complications will improve Outcome: Progressing   Problem: Clinical Measurements: Goal: Cardiovascular complication will be avoided Outcome: Progressing   Problem: Education: Goal: Knowledge of General Education information will improve Description Including pain rating scale, medication(s)/side effects and non-pharmacologic comfort measures Outcome: Progressing   Problem: Health Behavior/Discharge Planning: Goal: Ability to manage health-related needs will improve Outcome: Progressing   Problem: Nutrition: Goal: Adequate nutrition will be maintained Outcome: Progressing   Problem: Coping: Goal: Level of anxiety will decrease Outcome: Progressing   Problem: Pain Managment: Goal: General experience of comfort will improve Outcome: Progressing   Problem: Skin Integrity: Goal: Risk for impaired skin integrity will decrease Outcome: Progressing

## 2018-07-14 NOTE — Discharge Summary (Signed)
Physician Discharge Summary  [name] XFG:[id number] DOB: 05/09/1945 DOA: 07/09/2018  PCP: Shirline Frees, MD  Admit date: 07/09/2018 Discharge date: 07/14/2018  Time spent: 35 minutes  Recommendations for Outpatient Follow-up:  1. Cardiology [name] in 2 weeks   Discharge Diagnoses:  Principal Problem:   Acute respiratory failure with hypoxia ([place])   ACute on chronic systolic CHF:   CAD (coronary artery disease)   CHF exacerbation ([facility])   Metabolic encephalopathy   Elevated LFTs Chronic pain syndrome Chronic anemia Tobacco abuse  Discharge Condition: Stable  Diet recommendation: Heart healthy  Filed Weights   07/12/18 0500 07/13/18 0521 07/14/18 0628  Weight: 69.1 kg 68 kg 67.3 kg    History of present illness:  72/M with PMH of ischemic cardiomyopathy with EF in the 15 to 20% range status post biventricular pacemaker, CADwith stent, anemia, hyperlipidemia, GERD; admitted on 07/09/2018, presented with complaint of shortnes of breath, was found to have  Acute on chronic systolic CHF  Hospital Course:   Acute hypoxic respiratory failure  -Due to acute on chronic systolic CHF/ischemic cardiomyopathy  -Diuresed with IV Lasix, weaned off O2  -Ordered repeat echo, noted EF of 20%, which is unchanged from prior in 2015 , also notes restrictive filling, severe severe left ventricular enlargement, severe left atrial enlargement enlargement, moderate MR and mild pulmonary -Discharged home on Lasix 40 mg twice daily -Followed by Dr. Candee Furbish with [facility] heart care -Ruled out for Covid 19  Metabolic encephalopathy Patientwasvery somnolent and lethargic on presentation -Likely due to hypoxemia -Resolved, work-up unremarkable  History of chronic pain as well as chronic anxiety. Patient is on chronic OxyContin as well as Ativan. Her UDS is negative twice, this is concerning  Chronic anemia -hb stable  Elevated LFTs AST 84, ALT 70, alk phos 134. T bili  normal. -Improved, likely secondary to improved passive hepatic congestion  History of CAD status post stent -Patient not complaining of chest pain.  -Troponin negative.EKG with paced rhythm.  Tobacco use -NicoDerm patch     Discharge Exam: Vitals:   07/14/18 0614 07/14/18 0810  BP: 115/80 107/63  Pulse: 66 69  Resp: 15   Temp: 97.8 F (36.6 C)   SpO2: 95%     General: AAOx3 Cardiovascular: S1S2/RRR Respiratory: CTAB  Discharge Instructions   Discharge Instructions    Diet - low sodium heart healthy   Complete by:  As directed    Diet - low sodium heart healthy   Complete by:  As directed    Discharge instructions   Complete by:  As directed    It is important that you read the given instructions as well as go over your medication list with RN to help you understand your care after this hospitalization.  Discharge Instructions: Please follow-up with PCP in 1-2 weeks  Please request your primary care physician to go over all Hospital Tests and Procedure/Radiological results at the follow up. Please get all Hospital records sent to your PCP by signing hospital release before you go home.   Do not take more than prescribed Pain, Sleep and Anxiety Medications. You were cared for by a hospitalist during your hospital stay. If you have any questions about your discharge medications or the care you received while you were in the hospital after you are discharged, you can call the unit '@UNIT'$ @ you were admitted to and ask to speak with the hospitalist on call if the hospitalist that took care of you is not available.  Once  you are discharged, your primary care physician will handle any further medical issues. Please note that NO REFILLS for any discharge medications will be authorized once you are discharged, as it is imperative that you return to your primary care physician (or establish a relationship with a primary care physician if you do not have one) for your  aftercare needs so that they can reassess your need for medications and monitor your lab values. You Must read complete instructions/literature along with all the possible adverse reactions/side effects for all the Medicines you take and that have been prescribed to you. Take any new Medicines after you have completely understood and accept all the possible adverse reactions/side effects. Wear Seat belts while driving. If you have smoked or chewed Tobacco in the last 2 yrs please stop smoking and/or stop any Recreational drug use.  If you drink alcohol, please stop the use and do not drive, operating heavy machinery, perform activities at heights, swimming or participation in water activities or provide baby sitting services under influence.   Increase activity slowly   Complete by:  As directed    Increase activity slowly   Complete by:  As directed      Allergies as of 07/14/2018      Reactions   Clorazepate Dipotassium Other (See Comments)   Caused confusion per husband      Medication List    STOP taking these medications   amoxicillin-clavulanate 875-125 MG tablet Commonly known as:  Augmentin   cyclobenzaprine 5 MG tablet Commonly known as:  FLEXERIL   NONFORMULARY OR COMPOUNDED ITEM   oxyCODONE-acetaminophen 5-325 MG tablet Commonly known as:  Percocet   zolpidem 10 MG tablet Commonly known as:  AMBIEN     TAKE these medications   acetaminophen 500 MG tablet Commonly known as:  TYLENOL Take 1,000 mg by mouth every 6 (six) hours as needed for headache (pain).   aspirin EC 81 MG tablet Take 81 mg by mouth every evening.   carbamazepine 100 MG chewable tablet Commonly known as:  TEGRETOL Chew 100 mg by mouth 3 (three) times daily.   carvedilol 3.125 MG tablet Commonly known as:  COREG TAKE ONE TABLET BY MOUTH TWICE DAILY WITH A MEAL What changed:  additional instructions   Ferrous Sulfate 27 MG Tabs Take 27 mg by mouth every evening.   folic acid 1 MG  tablet Commonly known as:  FOLVITE Take 1 tablet (1 mg total) by mouth daily.   furosemide 80 MG tablet Commonly known as:  LASIX Take 0.5 tablets (40 mg total) by mouth 2 (two) times daily. Take 40 mg tab in morning at 8am and then second tab in afternoon at 2pm What changed:    how much to take  when to take this  additional instructions   gabapentin 800 MG tablet Commonly known as:  NEURONTIN Take 0.5 tablets (400 mg total) by mouth 3 (three) times daily. For pain What changed:    how much to take  when to take this   levothyroxine 100 MCG tablet Commonly known as:  SYNTHROID, LEVOTHROID Take 1 tablet by mouth once daily What changed:    how much to take  when to take this  additional instructions   mirtazapine 45 MG tablet Commonly known as:  REMERON Take 45 mg by mouth at bedtime.   nicotine 14 mg/24hr patch Commonly known as:  NICODERM CQ - dosed in mg/24 hours Place 1 patch (14 mg total) onto the skin daily.  nitroGLYCERIN 0.4 MG SL tablet Commonly known as:  NITROSTAT Dissolve ONE TABLET UNDER THE TONGUE AS DIRECTED FOR CHEST pain What changed:  See the new instructions.   OVER THE COUNTER MEDICATION Apply 1 application topically daily as needed (knee pain). Veterinary liniment   Oxycodone HCl 10 MG Tabs Take 1 tablet (10 mg total) by mouth 2 (two) times daily as needed (pain). What changed:    when to take this  reasons to take this   potassium chloride SA 20 MEQ tablet Commonly known as:  K-DUR,KLOR-CON TAKE ONE TABLET BY MOUTH DAILY   ProAir HFA 108 (90 Base) MCG/ACT inhaler Generic drug:  albuterol Inhale 2 puffs into the lungs every 6 (six) hours as needed for wheezing or shortness of breath.   promethazine 25 MG tablet Commonly known as:  PHENERGAN Take 25 mg by mouth every 6 (six) hours as needed for nausea or vomiting.   ramipril 2.5 MG capsule Commonly known as:  ALTACE Take 1 capsule (2.5 mg total) by mouth daily. What  changed:  when to take this   REFRESH TEARS OP Place 1 drop into both eyes daily as needed (dry eyes).   simvastatin 10 MG tablet Commonly known as:  ZOCOR TAKE ONE TABLET (10 MG) BY MOUTH EVERY DAY What changed:  See the new instructions.   spironolactone 25 MG tablet Commonly known as:  ALDACTONE Take 0.5 tablets (12.5 mg total) by mouth daily. What changed:  when to take this   venlafaxine XR 150 MG 24 hr capsule Commonly known as:  EFFEXOR-XR Take 300 mg by mouth daily with breakfast.      Allergies  Allergen Reactions  . Clorazepate Dipotassium Other (See Comments)    Caused confusion per husband   Follow-up Information    [facility] Follow up.   Specialty:  Home Health Services Why:  Registered Nurse and Physical Therapy Contact information: [place] [address] [phone]        Jerline Pain, MD. Schedule an appointment as soon as possible for a visit in 10 day(s).   Specialty:  Cardiology Contact information: [address] [place] [address] [phone]            The results of significant diagnostics from this hospitalization (including imaging, microbiology, ancillary and laboratory) are listed below for reference.    Significant Diagnostic Studies: Ct Head Wo Contrast  Result Date: 07/10/2018 CLINICAL DATA:  Encephalopathy.  Dizziness.  Dyspnea. EXAM: CT HEAD WITHOUT CONTRAST TECHNIQUE: Contiguous axial images were obtained from the base of the skull through the vertex without intravenous contrast. COMPARISON:  05/28/2018 head CT. FINDINGS: Brain: No evidence of parenchymal hemorrhage or extra-axial fluid collection. No mass lesion, mass effect, or midline shift. No CT evidence of acute infarction. Nonspecific mild subcortical and periventricular white matter hypodensity, most in keeping with chronic small vessel ischemic change. Cerebral volume is age appropriate. No  ventriculomegaly. Vascular: No acute abnormality. Skull: No evidence of calvarial fracture. Stable postsurgical changes from right suboccipital craniectomy. Sinuses/Orbits: The visualized paranasal sinuses are essentially clear. Other:  The mastoid air cells are unopacified. IMPRESSION: 1. No evidence of acute intracranial abnormality. No evidence of calvarial fracture. 2. Mild chronic small vessel ischemic changes in the cerebral white matter. 3. Stable postsurgical changes from right suboccipital craniectomy. Electronically Signed   By: Ilona Sorrel M.D.   On: 07/10/2018 06:48   Ct Chest Wo Contrast  Result Date: 07/10/2018 CLINICAL  DATA:  Dyspnea, nausea, dizziness. Clinical concern for COVID-19. EXAM: CT CHEST WITHOUT CONTRAST TECHNIQUE: Multidetector CT imaging of the chest was performed following the standard protocol without IV contrast. COMPARISON:  Chest radiograph from earlier today. 07/16/2007 chest CT angiogram. FINDINGS: Motion degraded scan, limiting assessment. Cardiovascular: Moderate cardiomegaly. No significant pericardial effusion/thickening. Three-vessel coronary atherosclerosis. Three lead left subclavian ICD is noted with lead tips in the right atrium, right ventricular apex and coronary sinus. Atherosclerotic nonaneurysmal thoracic aorta. Dilated main pulmonary artery (4.2 cm diameter). Mediastinum/Nodes: No discrete thyroid nodules. Unremarkable esophagus. No axillary adenopathy. Mild right paratracheal adenopathy up to 1.2 cm (series 9/image 19). Enlarged 1.1 cm subcarinal node (series 9/image 27). Bilateral hilar soft tissue fullness, cannot exclude bilateral hilar adenopathy, poorly delineated on this noncontrast scan. Lungs/Pleura: No pneumothorax. No pleural effusion. Possible tracheobronchomalacia. No acute consolidative airspace disease or lung masses. Two tiny solid left pulmonary nodules, largest 3 mm in the posterior left upper lobe (series 14/image 45), both stable since  07/16/2007 chest CT, considered benign. No new significant pulmonary nodules. Diffuse interlobular septal thickening in both lungs. Mild patchy ground-glass opacity in both lungs, most prominent in the upper lobes, without a peripheral predominance. Diffuse bronchial wall thickening. Upper abdomen: No acute abnormality. Musculoskeletal: No aggressive appearing focal osseous lesions. Moderate thoracic spondylosis. IMPRESSION: 1. Moderate cardiomegaly.  Three-vessel coronary atherosclerosis. 2. Prominently dilated main pulmonary artery, suggesting pulmonary arterial hypertension. 3. Diffuse interlobular septal thickening suggesting mild pulmonary edema. No pleural effusions. 4. Mild patchy ground-glass opacity in both lungs, nonspecific, favored to be due to mild pulmonary edema. Early/mild viral pneumonia is not entirely excluded but is not strongly favored at this time. 5. Nonspecific mild mediastinal and probable bilateral hilar lymphadenopathy, poorly delineated on this noncontrast motion degraded scan. Suggest attention on follow-up chest CT with IV contrast in 3-6 months. Aortic Atherosclerosis (ICD10-I70.0). Electronically Signed   By: Ilona Sorrel M.D.   On: 07/10/2018 06:41   Dg Chest Port 1 View  Result Date: 07/10/2018 CLINICAL DATA:  Shortness of breath. EXAM: PORTABLE CHEST 1 VIEW COMPARISON:  05/28/2018 FINDINGS: Left-sided pacemaker in place. Chronic cardiomegaly with unchanged mediastinal contours. Bilateral hilar prominence which is chronic. Diffuse interstitial opacities no evidence of focal airspace disease, large pleural effusion or pneumothorax. Biapical pleuroparenchymal scarring. IMPRESSION: Chronic cardiomegaly. Diffuse interstitial opacities likely represent pulmonary edema. Electronically Signed   By: Keith Rake M.D.   On: 07/10/2018 00:54    Microbiology: Recent Results (from the past 240 hour(s))  Novel Coronavirus, NAA (hospital order; send-out to ref lab)     Status: None    Collection Time: 07/10/18  7:40 AM  Result Value Ref Range Status   SARS-CoV-2, NAA NOT DETECTED NOT DETECTED Final    Comment: Performed at [facility]  Final    Comment: Performed at [facility], [place] [address], [place], [place] [address]     Labs: Basic Metabolic Panel: Recent Labs  Lab 07/10/18 0000 07/10/18 0313 07/11/18 0525 07/12/18 0746 07/13/18 0711 07/14/18 0514  NA 137 139 139 140 141 142  K 4.3 4.8 3.4* 3.7 3.9 4.0  CL 107  --  101 103 107 105  CO2 19*  --  26 27 24 27   GLUCOSE 210*  --  97 96 95 91  BUN 15  --  15 13 14 15   CREATININE 0.79  --  0.85 0.81 0.86 0.90  CALCIUM 9.1  --  9.1 8.9 9.0 9.2  MG  --   --   --   --  2.1  --    Liver Function Tests: Recent Labs  Lab 07/10/18 0000 07/11/18 0525 07/12/18 0746  AST 84* 27 19  ALT 70* 42 32  ALKPHOS 134* 114 115  BILITOT 0.6 0.7 0.6  PROT 6.9 6.1* 6.0*  ALBUMIN 3.5 3.1* 3.0*   Recent Labs  Lab 07/10/18 0000  LIPASE 21   Recent Labs  Lab 07/10/18 0544  AMMONIA 23   CBC: Recent Labs  Lab 07/10/18 0000 07/10/18 0313 07/12/18 0746  WBC 7.8  --  6.7  NEUTROABS  --   --  4.2  HGB 10.9* 11.2* 11.0*  HCT 35.7* 33.0* 35.1*  MCV 97.3  --  94.9  PLT 253  --  235   Cardiac Enzymes: Recent Labs  Lab 07/10/18 0000  TROPONINI <0.03   BNP: BNP (last 3 results) Recent Labs    07/10/18 0000  BNP 1,778.4*    ProBNP (last 3 results) No results for input(s): PROBNP in the last 8760 hours.  CBG: No results for input(s): GLUCAP in the last 168 hours.     Signed:  Domenic Polite MD.  [facility] 07/14/2018, 5:03 PM

## 2018-07-15 DIAGNOSIS — E785 Hyperlipidemia, unspecified: Secondary | ICD-10-CM | POA: Diagnosis not present

## 2018-07-15 DIAGNOSIS — G9341 Metabolic encephalopathy: Secondary | ICD-10-CM | POA: Diagnosis not present

## 2018-07-15 DIAGNOSIS — D649 Anemia, unspecified: Secondary | ICD-10-CM | POA: Diagnosis not present

## 2018-07-15 DIAGNOSIS — I700 Atherosclerosis of aorta: Secondary | ICD-10-CM | POA: Diagnosis not present

## 2018-07-15 DIAGNOSIS — E890 Postprocedural hypothyroidism: Secondary | ICD-10-CM | POA: Diagnosis not present

## 2018-07-15 DIAGNOSIS — M961 Postlaminectomy syndrome, not elsewhere classified: Secondary | ICD-10-CM | POA: Diagnosis not present

## 2018-07-15 DIAGNOSIS — K219 Gastro-esophageal reflux disease without esophagitis: Secondary | ICD-10-CM | POA: Diagnosis not present

## 2018-07-15 DIAGNOSIS — G894 Chronic pain syndrome: Secondary | ICD-10-CM | POA: Diagnosis not present

## 2018-07-15 DIAGNOSIS — F17210 Nicotine dependence, cigarettes, uncomplicated: Secondary | ICD-10-CM | POA: Diagnosis not present

## 2018-07-15 DIAGNOSIS — J9601 Acute respiratory failure with hypoxia: Secondary | ICD-10-CM | POA: Diagnosis not present

## 2018-07-15 DIAGNOSIS — I5023 Acute on chronic systolic (congestive) heart failure: Secondary | ICD-10-CM | POA: Diagnosis not present

## 2018-07-15 DIAGNOSIS — I2510 Atherosclerotic heart disease of native coronary artery without angina pectoris: Secondary | ICD-10-CM | POA: Diagnosis not present

## 2018-07-15 DIAGNOSIS — I110 Hypertensive heart disease with heart failure: Secondary | ICD-10-CM | POA: Diagnosis not present

## 2018-07-15 DIAGNOSIS — I255 Ischemic cardiomyopathy: Secondary | ICD-10-CM | POA: Diagnosis not present

## 2018-07-15 DIAGNOSIS — Z7982 Long term (current) use of aspirin: Secondary | ICD-10-CM | POA: Diagnosis not present

## 2018-07-15 DIAGNOSIS — G500 Trigeminal neuralgia: Secondary | ICD-10-CM | POA: Diagnosis not present

## 2018-07-16 ENCOUNTER — Telehealth: Payer: Self-pay

## 2018-07-16 NOTE — Telephone Encounter (Signed)
I tried to call patient about upcoming appointment with Dr. Caryl Comes, we need to switch this to a virtual telehealth visit. There was no answer and no voicemail to leave a message.

## 2018-07-20 ENCOUNTER — Other Ambulatory Visit: Payer: Self-pay

## 2018-07-20 ENCOUNTER — Telehealth (INDEPENDENT_AMBULATORY_CARE_PROVIDER_SITE_OTHER): Payer: Medicare Other | Admitting: Cardiology

## 2018-07-20 ENCOUNTER — Encounter: Payer: Self-pay | Admitting: Cardiology

## 2018-07-20 VITALS — BP 130/80 | HR 77 | Ht 63.0 in | Wt 145.0 lb

## 2018-07-20 DIAGNOSIS — I255 Ischemic cardiomyopathy: Secondary | ICD-10-CM

## 2018-07-20 DIAGNOSIS — G500 Trigeminal neuralgia: Secondary | ICD-10-CM

## 2018-07-20 DIAGNOSIS — Z720 Tobacco use: Secondary | ICD-10-CM

## 2018-07-20 DIAGNOSIS — Z95810 Presence of automatic (implantable) cardiac defibrillator: Secondary | ICD-10-CM

## 2018-07-20 DIAGNOSIS — I5022 Chronic systolic (congestive) heart failure: Secondary | ICD-10-CM

## 2018-07-20 NOTE — Patient Instructions (Signed)
Medication Instructions:  Please continue your current medications as listed.  If you need a refill on your cardiac medications before your next appointment, please call your pharmacy.   Follow-Up: Follow up in 2 months with Dr Marlou Porch.  Thank you for choosing [facility]!!

## 2018-07-20 NOTE — Telephone Encounter (Signed)

## 2018-07-20 NOTE — Progress Notes (Signed)
Virtual Visit via Telephone Note   This visit type was conducted due to national recommendations for restrictions regarding the COVID-19 Pandemic (e.g. social distancing) in an effort to limit this patient's exposure and mitigate transmission in our community.  Due to her co-morbid illnesses, this patient is at least at moderate risk for complications without adequate follow up.  This format is felt to be most appropriate for this patient at this time.  The patient did not have access to video technology/had technical difficulties with video requiring transitioning to audio format only (telephone).  All issues noted in this document were discussed and addressed.  No physical exam could be performed with this format.  Please refer to the patient's chart for her  consent to telehealth for [facility].   Evaluation Performed:  Follow-up visit  Date:  07/20/2018   ID:  [name], [name] 1945/08/14, MRN [id number]  Patient Location: Home Provider Location: Home  PCP:  Shirline Frees, MD  Cardiologist:  Candee Furbish, MD  Electrophysiologist:  None  Chief Complaint: Post heart failure follow-up hospitalization, discharged on 07/14/2018  History of Present Illness:    [name] is a 73 y.o. female with recent hospitalization secondary to acute hypoxic respiratory failure thought to be secondary to chronic systolic heart failure.  She was diuresed with IV Lasix and weaned off of oxygen.  Echocardiogram performed during this hospitalization noted ejection fraction of about 20% unchanged from prior in 2015.  Moderate mitral regurgitation.  She was discharged home on Lasix 40 mg twice a day.  She was also ruled out for COVID-19.  She was also felt to be quite somnolent and lethargic on presentation possibly due to hypoxemia.  This resolved.  Her work-up was unremarkable.  LFTs were mildly elevated ALT 70, AST 84 likely secondary to passive hepatic congestion.  These numbers returned to  normal.  Prior history of CAD with stent placement troponin was negative and EKG showed paced rhythm.  She has a Bi V pacer.  145 lbs at home.   The patient does not have symptoms concerning for COVID-19 infection (fever, chills, cough, or new shortness of breath).    Past Medical History:  Diagnosis Date  . Anemia   . CAD (coronary artery disease)    Prior LAD stenting with stent thrombosis occurring in a withdrawal phase of her Plavix undertaken because of trigeminal neuralgia and release surgery. It was treated medically.  . Cancer ([place])    small bladder cancer removed no  treatment  . Cataract   . CHF (congestive heart failure) ([place])   . Complication of anesthesia    massive heart attack  . GERD (gastroesophageal reflux disease)   . Hyperlipidemia   . Ischemic cardiomyopathy   . Osteoarthritis   . SOB (shortness of breath)    Past Surgical History:  Procedure Laterality Date  . APPENDECTOMY    . BACK SURGERY     x2 back surgeries  . BI-VENTRICULAR IMPLANTABLE CARDIOVERTER DEFIBRILLATOR  (CRT-D)  2009; 05/2013   STJ CRTD generator change by Dr Caryl Comes 05/2013  . BIV PACEMAKER GENERATOR CHANGE OUT Bilateral 05/11/2013   Procedure: BIV PACEMAKER GENERATOR CHANGE OUT;  Surgeon: Deboraha Sprang, MD;  Location: [facility] CATH LAB;  Service: Cardiovascular;  Laterality: Bilateral;  . BRAIN SURGERY     x3  . EYE SURGERY Right 05/24/12  . JOINT REPLACEMENT     thumbs and knees replacement  . TUBAL LIGATION  Current Meds  Medication Sig  . acetaminophen (TYLENOL) 500 MG tablet Take 1,000 mg by mouth every 6 (six) hours as needed for headache (pain).  Marland Kitchen albuterol (PROAIR HFA) 108 (90 Base) MCG/ACT inhaler Inhale 2 puffs into the lungs every 6 (six) hours as needed for wheezing or shortness of breath.  Marland Kitchen aspirin EC 81 MG tablet Take 81 mg by mouth every evening.   . carbamazepine (TEGRETOL) 100 MG chewable tablet Chew 100 mg by mouth 3 (three) times daily.   . Carboxymethylcellulose  Sodium (REFRESH TEARS OP) Place 1 drop into both eyes daily as needed (dry eyes).  . carvedilol (COREG) 3.125 MG tablet TAKE ONE TABLET BY MOUTH TWICE DAILY WITH A MEAL (Patient taking differently: Take 3.125 mg by mouth 2 (two) times daily with a meal. 3pm and 9pm daily)  . Ferrous Sulfate 27 MG TABS Take 27 mg by mouth every evening.  . folic acid (FOLVITE) 1 MG tablet Take 1 tablet (1 mg total) by mouth daily.  . furosemide (LASIX) 80 MG tablet Take 0.5 tablets (40 mg total) by mouth 2 (two) times daily. Take 40 mg tab in morning at 8am and then second tab in afternoon at 2pm  . gabapentin (NEURONTIN) 800 MG tablet Take 0.5 tablets (400 mg total) by mouth 3 (three) times daily. For pain  . levothyroxine (SYNTHROID, LEVOTHROID) 100 MCG tablet Take 1 tablet by mouth once daily (Patient taking differently: Take 50-100 mcg by mouth See admin instructions. Take 1/2 tablet (50 mcg) by mouth on Fridays, take 1 tablet (100 mcg) on all other days of the week)  . mirtazapine (REMERON) 45 MG tablet Take 45 mg by mouth at bedtime.  . nicotine (NICODERM CQ - DOSED IN MG/24 HOURS) 14 mg/24hr patch Place 1 patch (14 mg total) onto the skin daily.  . nitroGLYCERIN (NITROSTAT) 0.4 MG SL tablet Dissolve ONE TABLET UNDER THE TONGUE AS DIRECTED FOR CHEST pain  . OVER THE COUNTER MEDICATION Apply 1 application topically daily as needed (knee pain). Veterinary liniment  . Oxycodone HCl 10 MG TABS Take 1 tablet (10 mg total) by mouth 2 (two) times daily as needed (pain).  . potassium chloride SA (K-DUR,KLOR-CON) 20 MEQ tablet TAKE ONE TABLET BY MOUTH DAILY  . promethazine (PHENERGAN) 25 MG tablet Take 25 mg by mouth every 6 (six) hours as needed for nausea or vomiting.   . ramipril (ALTACE) 2.5 MG capsule Take 1 capsule (2.5 mg total) by mouth daily.  . simvastatin (ZOCOR) 10 MG tablet TAKE ONE TABLET (10 MG) BY MOUTH EVERY DAY  . spironolactone (ALDACTONE) 25 MG tablet Take 0.5 tablets (12.5 mg total) by mouth daily.   Marland Kitchen venlafaxine XR (EFFEXOR-XR) 150 MG 24 hr capsule Take 300 mg by mouth daily with breakfast.    Current Facility-Administered Medications for the 07/20/18 encounter (Telemedicine) with Jerline Pain, MD  Medication  . betamethasone acetate-betamethasone sodium phosphate (CELESTONE) injection 3 mg     Allergies:   Clorazepate dipotassium   Social History   Tobacco Use  . Smoking status: Current Some Day Smoker    Packs/day: 0.50    Years: 30.00    Pack years: 15.00    Types: Cigarettes  . Smokeless tobacco: Never Used  Substance Use Topics  . Alcohol use: No  . Drug use: No     Family Hx: The patient's family history includes Heart disease in her father.  ROS:   Please see the history of present illness.  Denies any syncope bleeding orthopnea PND All other systems reviewed and are negative.   Prior CV studies:   The following studies were reviewed today:  Echo-EF 20%, unchanged  Labs/Other Tests and Data Reviewed:    EKG:  An ECG dated 06/2018 was personally reviewed today and demonstrated:  Paced rhythm  Recent Labs: 07/10/2018: B Natriuretic Peptide 1,778.4; TSH 3.040 07/12/2018: ALT 32; Hemoglobin 11.0; Platelets 235 07/13/2018: Magnesium 2.1 07/14/2018: BUN 15; Creatinine, Ser 0.90; Potassium 4.0; Sodium 142   Recent Lipid Panel No results found for: CHOL, TRIG, HDL, CHOLHDL, LDLCALC, LDLDIRECT  Wt Readings from Last 3 Encounters:  07/20/18 145 lb (65.8 kg)  07/14/18 148 lb 6.4 oz (67.3 kg)  01/14/18 158 lb 12.8 oz (72 kg)     Objective:    Vital Signs:  BP 130/80   Pulse 77   Ht 5\' 3"  (1.6 m)   Wt 145 lb (65.8 kg)   LMP  (LMP Unknown)   BMI 25.69 kg/m    VITAL SIGNS:  reviewed She was able to complete full sentences without difficulty.  Appeared alert and oriented.  Was a little slow to ask a question but overall doing well  ASSESSMENT & PLAN:    Chronic systolic heart failure -Recent hospitalization.  Lasix now 40 mg twice a day.  Diurese  with IV Lasix.  EF unchanged to 20%.  Prior hypotension.  Not a candidate for Entresto based upon blood pressure. -Creatinine 0.9, hemoglobin 11.  We will continue with current plan.  Low-dose beta-blocker.  Low-dose ramipril.  Spironolactone.  No changes made. -She understands to take an additional 40 mg of Lasix if her weight increases 3 to 4 pounds.  We are going to try to maintain her weight at 145.  Biventricular ICD - Functioning normally.  Monitoring fluid status.  Dr. Caryl Comes.  Tobacco use - Continue to encourage cessation.  Trigeminal neuralgia, chronic pain - She had dismissed herself from the chronic pain clinic previously.  States that her face was hurting today and overnight.  COVID-19 Education: The signs and symptoms of COVID-19 were discussed with the patient and how to seek care for testing (follow up with PCP or arrange E-visit).  The importance of social distancing was discussed today.  Time:   Today, I have spent 15 minutes with the patient with telehealth technology discussing the above problems.     Medication Adjustments/Labs and Tests Ordered: Current medicines are reviewed at length with the patient today.  Concerns regarding medicines are outlined above.   Tests Ordered: No orders of the defined types were placed in this encounter.   Medication Changes: No orders of the defined types were placed in this encounter.   Disposition:  Follow up in 2 month(s)  Signed, Candee Furbish, MD  07/20/2018 2:25 PM    [facility]

## 2018-07-22 ENCOUNTER — Telehealth (INDEPENDENT_AMBULATORY_CARE_PROVIDER_SITE_OTHER): Payer: Medicare Other | Admitting: Internal Medicine

## 2018-07-22 ENCOUNTER — Other Ambulatory Visit: Payer: Self-pay

## 2018-07-22 ENCOUNTER — Ambulatory Visit: Payer: Medicare Other | Admitting: Cardiology

## 2018-07-22 DIAGNOSIS — F329 Major depressive disorder, single episode, unspecified: Secondary | ICD-10-CM | POA: Diagnosis not present

## 2018-07-22 DIAGNOSIS — I5022 Chronic systolic (congestive) heart failure: Secondary | ICD-10-CM

## 2018-07-22 DIAGNOSIS — I471 Supraventricular tachycardia: Secondary | ICD-10-CM

## 2018-07-22 DIAGNOSIS — I255 Ischemic cardiomyopathy: Secondary | ICD-10-CM | POA: Diagnosis not present

## 2018-07-22 DIAGNOSIS — Z95810 Presence of automatic (implantable) cardiac defibrillator: Secondary | ICD-10-CM

## 2018-07-22 DIAGNOSIS — Z720 Tobacco use: Secondary | ICD-10-CM

## 2018-07-22 NOTE — Progress Notes (Signed)
Electrophysiology TeleHealth Note   Due to national recommendations of social distancing due to COVID 19, an audio/video telehealth visit is felt to be most appropriate for this patient at this time.  See MyChart message from today for the patient's consent to telehealth for [facility].   Date:  07/22/2018   ID:  [name], DOB [birth year], MRN [id number]  Location: patient's home  Provider location: [address] [place]  Evaluation Performed: Follow-up visit  PCP:  Tracey Frees, MD  Cardiologist:   MS Electrophysiologist:  SK   Chief Complaint:  CRT-D; CHF and ICM  History of Present Illness:    [name] is a 73 y.o. female who presents via audio/video conferencing for a telehealth visit today. The patient did not have access to video technology/had technical difficulties with video requiring transitioning to audio format only (telephone).  All issues noted in this document were discussed and addressed.  No physical exam could be performed with this format.     Since last being seen in our clinic, the patient reports just having been discharged from hospital for A/C CHF *  DATE TEST EF   1/17 Myoview   19 % Prior Infarct  4/20 Echo   15-20 % MR mod        Date Cr K Hgb  4/20 0.9 4.0 11.0         The patient denies chest pain   nocturnal dyspnea  orthopnea*  or peripheral edema  There have been no palpitations* lightheadedness * or syncope  Dyspnea is chronic, multifactorial and back at baseline    Thinks salt excess triggered CHF exacerbation    The patient denies symptoms of fevers, chills, cough, or new SOB worrisome for COVID 19.    Past Medical History:  Diagnosis Date  . Anemia   . CAD (coronary artery disease)    Prior LAD stenting with stent thrombosis occurring in a withdrawal phase of her Plavix undertaken because of trigeminal neuralgia and release surgery. It was treated medically.  . Cancer ([place])    small bladder  cancer removed no  treatment  . Cataract   . CHF (congestive heart failure) ([place])   . Complication of anesthesia    massive heart attack  . GERD (gastroesophageal reflux disease)   . Hyperlipidemia   . Ischemic cardiomyopathy   . Osteoarthritis   . SOB (shortness of breath)     Past Surgical History:  Procedure Laterality Date  . APPENDECTOMY    . BACK SURGERY     x2 back surgeries  . BI-VENTRICULAR IMPLANTABLE CARDIOVERTER DEFIBRILLATOR  (CRT-D)  2009; 05/2013   STJ CRTD generator change by Dr Caryl Comes 05/2013  . BIV PACEMAKER GENERATOR CHANGE OUT Bilateral 05/11/2013   Procedure: BIV PACEMAKER GENERATOR CHANGE OUT;  Surgeon: Deboraha Sprang, MD;  Location: [facility] CATH LAB;  Service: Cardiovascular;  Laterality: Bilateral;  . BRAIN SURGERY     x3  . EYE SURGERY Right 05/24/12  . JOINT REPLACEMENT     thumbs and knees replacement  . TUBAL LIGATION      Current Outpatient Medications  Medication Sig Dispense Refill  . acetaminophen (TYLENOL) 500 MG tablet Take 1,000 mg by mouth every 6 (six) hours as needed for headache (pain).    Marland Kitchen albuterol (PROAIR HFA) 108 (90 Base) MCG/ACT inhaler Inhale 2 puffs into the lungs every 6 (six) hours as needed for wheezing or shortness of breath.    Marland Kitchen aspirin  EC 81 MG tablet Take 81 mg by mouth every evening.     . carbamazepine (TEGRETOL) 100 MG chewable tablet Chew 100 mg by mouth 3 (three) times daily.   2  . Carboxymethylcellulose Sodium (REFRESH TEARS OP) Place 1 drop into both eyes daily as needed (dry eyes).    . carvedilol (COREG) 3.125 MG tablet TAKE ONE TABLET BY MOUTH TWICE DAILY WITH A MEAL (Patient taking differently: Take 3.125 mg by mouth 2 (two) times daily with a meal. 3pm and 9pm daily) 60 tablet 7  . Ferrous Sulfate 27 MG TABS Take 27 mg by mouth every evening.    . folic acid (FOLVITE) 1 MG tablet Take 1 tablet (1 mg total) by mouth daily. 30 tablet 0  . furosemide (LASIX) 80 MG tablet Take 0.5 tablets (40 mg total) by mouth 2 (two)  times daily. Take 40 mg tab in morning at 8am and then second tab in afternoon at 2pm 30 tablet 0  . gabapentin (NEURONTIN) 800 MG tablet Take 0.5 tablets (400 mg total) by mouth 3 (three) times daily. For pain    . levothyroxine (SYNTHROID, LEVOTHROID) 100 MCG tablet Take 1 tablet by mouth once daily (Patient taking differently: Take 50-100 mcg by mouth See admin instructions. Take 1/2 tablet (50 mcg) by mouth on Fridays, take 1 tablet (100 mcg) on all other days of the week) 60 tablet 0  . mirtazapine (REMERON) 45 MG tablet Take 45 mg by mouth at bedtime.    . nicotine (NICODERM CQ - DOSED IN MG/24 HOURS) 14 mg/24hr patch Place 1 patch (14 mg total) onto the skin daily. 28 patch 0  . nitroGLYCERIN (NITROSTAT) 0.4 MG SL tablet Dissolve ONE TABLET UNDER THE TONGUE AS DIRECTED FOR CHEST pain 25 tablet 4  . OVER THE COUNTER MEDICATION Apply 1 application topically daily as needed (knee pain). Veterinary liniment    . Oxycodone HCl 10 MG TABS Take 1 tablet (10 mg total) by mouth 2 (two) times daily as needed (pain).  0  . potassium chloride SA (K-DUR,KLOR-CON) 20 MEQ tablet TAKE ONE TABLET BY MOUTH DAILY 30 tablet 8  . promethazine (PHENERGAN) 25 MG tablet Take 25 mg by mouth every 6 (six) hours as needed for nausea or vomiting.     . ramipril (ALTACE) 2.5 MG capsule Take 1 capsule (2.5 mg total) by mouth daily. 90 capsule 3  . simvastatin (ZOCOR) 10 MG tablet TAKE ONE TABLET (10 MG) BY MOUTH EVERY DAY 90 tablet 2  . spironolactone (ALDACTONE) 25 MG tablet Take 0.5 tablets (12.5 mg total) by mouth daily. 45 tablet 3  . venlafaxine XR (EFFEXOR-XR) 150 MG 24 hr capsule Take 300 mg by mouth daily with breakfast.   4   Current Facility-Administered Medications  Medication Dose Route Frequency Provider Last Rate Last Dose  . betamethasone acetate-betamethasone sodium phosphate (CELESTONE) injection 3 mg  3 mg Intramuscular Once Edrick Kins, DPM        Allergies:   Clorazepate dipotassium   Social  History:  The patient  reports that she has been smoking cigarettes. She has a 15.00 pack-year smoking history. She has never used smokeless tobacco. She reports that she does not drink alcohol or use drugs.   Family History:  The patient's   family history includes Heart disease in her father.   ROS:  Please see the history of present illness.   All other systems are personally reviewed and negative.    Exam:  Vital Signs:        Labs/Other Tests and Data Reviewed:    Recent Labs: 07/10/2018: B Natriuretic Peptide 1,778.4; TSH 3.040 07/12/2018: ALT 32; Hemoglobin 11.0; Platelets 235 07/13/2018: Magnesium 2.1 07/14/2018: BUN 15; Creatinine, Ser 0.90; Potassium 4.0; Sodium 142   Wt Readings from Last 3 Encounters:  07/20/18 145 lb (65.8 kg)  07/14/18 148 lb 6.4 oz (67.3 kg)  01/14/18 158 lb 12.8 oz (72 kg)     Other studies personally reviewed: Additional studies/ records that were reviewed today include: hospital records  Review of the above records today demonstrates: *apixoban   Prior radiographs:     today. On my review,ECG tracings from  4/20 The tracings reveal P-synchronous/ AV  pacing with upright QRS V1 with qR in lead 1  Last device remote is reviewed from [facility] PDF dated *3/20 which reveals normal device function,   arrhythmias - none     ASSESSMENT & PLAN:    Ischemic cardiomyopathy   congestive heart failure-chronic-systolic  Implantable defibrillator-CRT-St. Jude  SVT-nonsustained   Cigarette abuse  Depression-  Best it has been    Concerns re misshape at ICD site following fall--will try and arrange a photo perhaps from the [facility] that is coming out  BiVPacing is in the low 90s but not significantly changed;  We will need to arrange in office assessment post COVID  Without symptoms of ischemia  Currently euvolemic  We have discussed the physiology of heart failure including the importance of salt restriction and fluid restriction and have  reviewed sources of dietary salt and water.   COVID 19 screen The patient denies symptoms of COVID 19 at this time.  The importance of social distancing was discussed today.  Follow-up:  75m  Next remote: As Scheduled   Current medicines are reviewed at length with the patient today.   The patient does not have concerns regarding her medicines.  The following changes were made today:  none  Labs/ tests ordered today include:   No orders of the defined types were placed in this encounter.   Future tests ( post COVID )     Patient Risk:  after full review of this patients clinical status, I feel that they are at moderate risk at this time.  Today, I have spent 11  minutes with the patient with telehealth technology discussing the above.  Signed, Virl Axe, MD   07/22/2018 3:40 PM     [address] [place] [address] [phone] (office) [phone] (fax)

## 2018-07-28 DIAGNOSIS — J441 Chronic obstructive pulmonary disease with (acute) exacerbation: Secondary | ICD-10-CM | POA: Diagnosis not present

## 2018-07-28 DIAGNOSIS — I5022 Chronic systolic (congestive) heart failure: Secondary | ICD-10-CM | POA: Diagnosis not present

## 2018-07-28 DIAGNOSIS — E039 Hypothyroidism, unspecified: Secondary | ICD-10-CM | POA: Diagnosis not present

## 2018-07-28 DIAGNOSIS — I2510 Atherosclerotic heart disease of native coronary artery without angina pectoris: Secondary | ICD-10-CM | POA: Diagnosis not present

## 2018-08-02 ENCOUNTER — Other Ambulatory Visit: Payer: Self-pay

## 2018-08-02 ENCOUNTER — Ambulatory Visit (INDEPENDENT_AMBULATORY_CARE_PROVIDER_SITE_OTHER): Payer: Medicare Other

## 2018-08-02 DIAGNOSIS — I5022 Chronic systolic (congestive) heart failure: Secondary | ICD-10-CM | POA: Diagnosis not present

## 2018-08-02 DIAGNOSIS — Z95810 Presence of automatic (implantable) cardiac defibrillator: Secondary | ICD-10-CM | POA: Diagnosis not present

## 2018-08-03 NOTE — Progress Notes (Signed)
EPIC Encounter for ICM Monitoring  Patient Name: MADELLYN DENIO is a 73 y.o. female Date: 08/03/2018 Primary Care Physican: Shirline Frees, MD Primary Cardiologist:[place] Electrophysiologist: Vergie Living Pacing: 94% 08/03/2018 Weight: 150 lbs (Baseline 148 - 150 lbs)   Heart failure questions reviewed.  She is receiving PT at home since April hospital discharge.  She records daily weights and advised if she gains more than 3 lbs overnight or 5 lbs in a week to notify physician's office or call ICM direct number.   Thoracic impedance normal.   Prescribed:Furosemide 40 mg 1 tablet twice at 8 AM and 12 noon.  Recommendations:Reviewed foods high in salt and suggested alternatives to decrease salt intake.  Advised to limit 2000 mg a day.   Follow-up plan: ICM clinic phone appointment on6/10/2018.  Virtual visit with Dr Marlou Porch 09/17/2018  Copy of ICM check sent to [facility].  3 month ICM trend: 08/02/2018    1 Year ICM trend:       [name], RN 08/03/2018 12:16 PM

## 2018-08-09 LAB — BLOOD GAS, ARTERIAL
Acid-Base Excess: 2 mmol/L (ref 0.0–2.0)
Bicarbonate: 26.6 mmol/L (ref 20.0–28.0)
Drawn by: 548871
O2 Saturation: 88.4 %
Patient temperature: 98.6
pCO2 arterial: 45.6 mmHg (ref 32.0–48.0)
pH, Arterial: 7.384 (ref 7.350–7.450)
pO2, Arterial: 66.8 mmHg — ABNORMAL LOW (ref 83.0–108.0)

## 2018-08-17 DIAGNOSIS — I2510 Atherosclerotic heart disease of native coronary artery without angina pectoris: Secondary | ICD-10-CM | POA: Diagnosis not present

## 2018-08-17 DIAGNOSIS — K219 Gastro-esophageal reflux disease without esophagitis: Secondary | ICD-10-CM | POA: Diagnosis not present

## 2018-08-17 DIAGNOSIS — M961 Postlaminectomy syndrome, not elsewhere classified: Secondary | ICD-10-CM | POA: Diagnosis not present

## 2018-08-17 DIAGNOSIS — E785 Hyperlipidemia, unspecified: Secondary | ICD-10-CM | POA: Diagnosis not present

## 2018-08-17 DIAGNOSIS — G894 Chronic pain syndrome: Secondary | ICD-10-CM | POA: Diagnosis not present

## 2018-08-17 DIAGNOSIS — D649 Anemia, unspecified: Secondary | ICD-10-CM | POA: Diagnosis not present

## 2018-08-17 DIAGNOSIS — J9601 Acute respiratory failure with hypoxia: Secondary | ICD-10-CM | POA: Diagnosis not present

## 2018-08-17 DIAGNOSIS — G9341 Metabolic encephalopathy: Secondary | ICD-10-CM | POA: Diagnosis not present

## 2018-08-17 DIAGNOSIS — G500 Trigeminal neuralgia: Secondary | ICD-10-CM | POA: Diagnosis not present

## 2018-08-17 DIAGNOSIS — I255 Ischemic cardiomyopathy: Secondary | ICD-10-CM | POA: Diagnosis not present

## 2018-08-17 DIAGNOSIS — F17210 Nicotine dependence, cigarettes, uncomplicated: Secondary | ICD-10-CM | POA: Diagnosis not present

## 2018-08-17 DIAGNOSIS — E890 Postprocedural hypothyroidism: Secondary | ICD-10-CM | POA: Diagnosis not present

## 2018-08-17 DIAGNOSIS — Z7982 Long term (current) use of aspirin: Secondary | ICD-10-CM | POA: Diagnosis not present

## 2018-08-17 DIAGNOSIS — I5023 Acute on chronic systolic (congestive) heart failure: Secondary | ICD-10-CM | POA: Diagnosis not present

## 2018-08-17 DIAGNOSIS — I110 Hypertensive heart disease with heart failure: Secondary | ICD-10-CM | POA: Diagnosis not present

## 2018-08-17 DIAGNOSIS — I700 Atherosclerosis of aorta: Secondary | ICD-10-CM | POA: Diagnosis not present

## 2018-08-27 DIAGNOSIS — G500 Trigeminal neuralgia: Secondary | ICD-10-CM | POA: Diagnosis not present

## 2018-08-27 DIAGNOSIS — R202 Paresthesia of skin: Secondary | ICD-10-CM | POA: Diagnosis not present

## 2018-08-27 DIAGNOSIS — G44091 Other trigeminal autonomic cephalgias (TAC), intractable: Secondary | ICD-10-CM | POA: Diagnosis not present

## 2018-08-30 ENCOUNTER — Other Ambulatory Visit: Payer: Medicare Other

## 2018-08-31 ENCOUNTER — Other Ambulatory Visit (INDEPENDENT_AMBULATORY_CARE_PROVIDER_SITE_OTHER): Payer: Medicare Other

## 2018-08-31 ENCOUNTER — Other Ambulatory Visit: Payer: Self-pay

## 2018-08-31 DIAGNOSIS — E890 Postprocedural hypothyroidism: Secondary | ICD-10-CM | POA: Diagnosis not present

## 2018-08-31 LAB — TSH: TSH: 4.05 u[IU]/mL (ref 0.35–4.50)

## 2018-08-31 LAB — T4, FREE: Free T4: 0.77 ng/dL (ref 0.60–1.60)

## 2018-08-31 NOTE — Progress Notes (Addendum)
Patient ID: [name], female   DOB: 1946/01/22, 73 y.o.   MRN: [id number]    Today's office visit was provided via telemedicine using audio technique Explained to the patient and the the limitations of evaluation and management by telemedicine and the availability of in person appointments.  The patient understood the limitations and agreed to proceed. Patient also understood that the telehealth visit is billable. . Location of the patient: Home . Location of the provider: Office Only the patient and myself were participating in the encounter    Reason for Appointment:  Post ablative hypothyroidism, followup  History of Present Illness:    She became hypothyroid in 03/2012 following radioactive iodine treatment for hyperthyroidism in 11/13  She was initially started with 75 mcg of levothyroxine but with her TSH being nearly 50 in 3/14 the dose was increased to 137 mcg Subsequently has required lower doses   She has usually no change in her physical symptoms with variable thyroid levels or dosage changes However some of this may be because of her difficulty with remembering symptoms  She had not followed up for over a year when she was seen in 8/19 and her TSH was suppressed at 0.03 with relatively higher free T4 At that time she had no new complaints such as palpitations, jitteriness or shakiness Her dosage at that time was 112 mcg  RECENT HISTORY:  In 2019 her dose was changed to 100 mcg levothyroxine She is now taking 6-1/2 tablets a week with 1/2 tablet on Fridays and she is consistent with this  She thinks she has lost weight from cutting out high-fat foods like ice cream but her weight is leveled off now No complaints of any unusual fatigue No significant cold intolerance also No palpitations lately She is quite consistent with taking her levothyroxine before breakfast  TSH is now 4.0   Wt Readings from Last 3 Encounters:  07/20/18 145 lb (65.8 kg)   07/14/18 148 lb 6.4 oz (67.3 kg)  01/14/18 158 lb 12.8 oz (72 kg)   Labs:  Lab Results  Component Value Date   TSH 4.05 08/31/2018   TSH 3.040 07/10/2018   TSH 0.31 (L) 01/12/2018   FREET4 0.77 08/31/2018   FREET4 0.99 01/12/2018   FREET4 1.17 11/03/2017       Allergies as of 09/01/2018      Reactions   Clorazepate Dipotassium Other (See Comments)   Caused confusion per husband      Medication List       Accurate as of August 31, 2018  9:30 PM. If you have any questions, ask your nurse or doctor.        acetaminophen 500 MG tablet Commonly known as:  TYLENOL Take 1,000 mg by mouth every 6 (six) hours as needed for headache (pain).   aspirin EC 81 MG tablet Take 81 mg by mouth every evening.   carbamazepine 100 MG chewable tablet Commonly known as:  TEGRETOL Chew 100 mg by mouth 3 (three) times daily.   carvedilol 3.125 MG tablet Commonly known as:  COREG TAKE ONE TABLET BY MOUTH TWICE DAILY WITH A MEAL What changed:  additional instructions   Ferrous Sulfate 27 MG Tabs Take 27 mg by mouth every evening.   folic acid 1 MG tablet Commonly known as:  FOLVITE Take 1 tablet (1 mg total) by mouth daily.   furosemide 80 MG tablet Commonly known as:  LASIX Take 0.5 tablets (40 mg total) by mouth 2 (  two) times daily. Take 40 mg tab in morning at 8am and then second tab in afternoon at 2pm   gabapentin 800 MG tablet Commonly known as:  NEURONTIN Take 0.5 tablets (400 mg total) by mouth 3 (three) times daily. For pain   levothyroxine 100 MCG tablet Commonly known as:  SYNTHROID Take 1 tablet by mouth once daily What changed:    how much to take  when to take this  additional instructions   mirtazapine 45 MG tablet Commonly known as:  REMERON Take 45 mg by mouth at bedtime.   nicotine 14 mg/24hr patch Commonly known as:  NICODERM CQ - dosed in mg/24 hours Place 1 patch (14 mg total) onto the skin daily.   nitroGLYCERIN 0.4 MG SL tablet Commonly known  as:  NITROSTAT Dissolve ONE TABLET UNDER THE TONGUE AS DIRECTED FOR CHEST pain   OVER THE COUNTER MEDICATION Apply 1 application topically daily as needed (knee pain). Veterinary liniment   Oxycodone HCl 10 MG Tabs Take 1 tablet (10 mg total) by mouth 2 (two) times daily as needed (pain).   potassium chloride SA 20 MEQ tablet Commonly known as:  K-DUR TAKE ONE TABLET BY MOUTH DAILY   ProAir HFA 108 (90 Base) MCG/ACT inhaler Generic drug:  albuterol Inhale 2 puffs into the lungs every 6 (six) hours as needed for wheezing or shortness of breath.   promethazine 25 MG tablet Commonly known as:  PHENERGAN Take 25 mg by mouth every 6 (six) hours as needed for nausea or vomiting.   ramipril 2.5 MG capsule Commonly known as:  ALTACE Take 1 capsule (2.5 mg total) by mouth daily.   REFRESH TEARS OP Place 1 drop into both eyes daily as needed (dry eyes).   simvastatin 10 MG tablet Commonly known as:  ZOCOR TAKE ONE TABLET (10 MG) BY MOUTH EVERY DAY   spironolactone 25 MG tablet Commonly known as:  ALDACTONE Take 0.5 tablets (12.5 mg total) by mouth daily.   venlafaxine XR 150 MG 24 hr capsule Commonly known as:  EFFEXOR-XR Take 300 mg by mouth daily with breakfast.           Past Medical History:  Diagnosis Date  . Anemia   . CAD (coronary artery disease)    Prior LAD stenting with stent thrombosis occurring in a withdrawal phase of her Plavix undertaken because of trigeminal neuralgia and release surgery. It was treated medically.  . Cancer ([place])    small bladder cancer removed no  treatment  . Cataract   . CHF (congestive heart failure) ([place])   . Complication of anesthesia    massive heart attack  . GERD (gastroesophageal reflux disease)   . Hyperlipidemia   . Ischemic cardiomyopathy   . Osteoarthritis   . SOB (shortness of breath)     Past Surgical History:  Procedure Laterality Date  . APPENDECTOMY    . BACK SURGERY     x2 back surgeries  . BI-VENTRICULAR  IMPLANTABLE CARDIOVERTER DEFIBRILLATOR  (CRT-D)  2009; 05/2013   STJ CRTD generator change by Dr Caryl Comes 05/2013  . BIV PACEMAKER GENERATOR CHANGE OUT Bilateral 05/11/2013   Procedure: BIV PACEMAKER GENERATOR CHANGE OUT;  Surgeon: Deboraha Sprang, MD;  Location: [facility] CATH LAB;  Service: Cardiovascular;  Laterality: Bilateral;  . BRAIN SURGERY     x3  . EYE SURGERY Right 05/24/12  . JOINT REPLACEMENT     thumbs and knees replacement  . TUBAL LIGATION      Family History  Problem Relation Age of Onset  . Heart disease Father     Social History:  reports that she has been smoking cigarettes. She has a 15.00 pack-year smoking history. She has never used smokeless tobacco. She reports that she does not drink alcohol or use drugs.  Allergies:  Allergies  Allergen Reactions  . Clorazepate Dipotassium Other (See Comments)    Caused confusion per husband    Review of Systems:  CARDIOLOGY: she has history of coronary artery disease with multiple events and interventions  She is on low-dose carvedilol and Lasix for CHF         Examination:   LMP  (LMP Unknown)   Patient is remote, no exam done     Assessment   Post ablative hypothyroidism:   She has required lower doses of supplementation for her thyroid in 2019  Now with taking 100 mcg levothyroxine 6-1/2 days a week  She is subjectively doing well Although she lost weight this year this may be voluntary  Her TSH is 4.0 which may be adequate and safe for her age and history of cardiac problems  She will continue the same regimen of levothyroxine  Discussed that she should let us know if she is having any unusual fatigue, lethargy or weight change  Follow-up in 4 months  Duration of telephone encounter =5 minutes  Christina Gintz 08/31/2018, 9:30 PM

## 2018-09-01 ENCOUNTER — Encounter: Payer: Self-pay | Admitting: Endocrinology

## 2018-09-01 ENCOUNTER — Ambulatory Visit (INDEPENDENT_AMBULATORY_CARE_PROVIDER_SITE_OTHER): Payer: Medicare Other | Admitting: Endocrinology

## 2018-09-01 DIAGNOSIS — E890 Postprocedural hypothyroidism: Secondary | ICD-10-CM | POA: Diagnosis not present

## 2018-09-01 MED ORDER — LEVOTHYROXINE SODIUM 100 MCG PO TABS: ORAL_TABLET | ORAL | 2 refills | Status: DC

## 2018-09-06 ENCOUNTER — Ambulatory Visit (INDEPENDENT_AMBULATORY_CARE_PROVIDER_SITE_OTHER): Payer: Medicare Other

## 2018-09-06 DIAGNOSIS — Z95810 Presence of automatic (implantable) cardiac defibrillator: Secondary | ICD-10-CM | POA: Diagnosis not present

## 2018-09-06 DIAGNOSIS — I5022 Chronic systolic (congestive) heart failure: Secondary | ICD-10-CM | POA: Diagnosis not present

## 2018-09-07 NOTE — Progress Notes (Signed)
EPIC Encounter for ICM Monitoring  Patient Name: [name] is a 73 y.o. female Date: 09/07/2018 Primary Care Physican: Shirline Frees, MD Primary Cardiologist:Skains Electrophysiologist: Vergie Living Pacing: 94% 08/03/2018 Weight: 150 lbs (Baseline 148 - 150 lbs) 09/07/2018 Weight: 148 lbs   Heart failure questions reviewed.  She is feeling fine and denies any fluid symptoms.   CorVue thoracic impedance normal.   Prescribed:Furosemide 80 mg take 0.5 tablets (40 mg total) by mouth 2 (two) times daily. Take 40 mg tab in morning at 8am and then second tab in afternoon at 2pm.  Recommendations:Encouraged to call if experiences any fluid symptoms.   Follow-up plan: ICM clinic phone appointment on7/13/2020.  Virtual visit with Dr Marlou Porch 09/17/2018  Copy of ICM check sent to [place].  3 month ICM trend: 09/06/2018    1 Year ICM trend:       [name], RN 09/07/2018 11:21 AM

## 2018-09-14 ENCOUNTER — Telehealth: Payer: Self-pay

## 2018-09-14 NOTE — Telephone Encounter (Signed)

## 2018-09-17 ENCOUNTER — Other Ambulatory Visit: Payer: Self-pay

## 2018-09-17 ENCOUNTER — Encounter: Payer: Self-pay | Admitting: Cardiology

## 2018-09-17 ENCOUNTER — Telehealth (INDEPENDENT_AMBULATORY_CARE_PROVIDER_SITE_OTHER): Payer: Medicare Other | Admitting: Cardiology

## 2018-09-17 VITALS — BP 110/66 | HR 77 | Ht 63.0 in | Wt 150.0 lb

## 2018-09-17 DIAGNOSIS — Z95810 Presence of automatic (implantable) cardiac defibrillator: Secondary | ICD-10-CM

## 2018-09-17 DIAGNOSIS — I255 Ischemic cardiomyopathy: Secondary | ICD-10-CM | POA: Diagnosis not present

## 2018-09-17 DIAGNOSIS — I5022 Chronic systolic (congestive) heart failure: Secondary | ICD-10-CM | POA: Diagnosis not present

## 2018-09-17 DIAGNOSIS — G500 Trigeminal neuralgia: Secondary | ICD-10-CM

## 2018-09-17 NOTE — Patient Instructions (Signed)
Medication Instructions:  The current medical regimen is effective;  continue present plan and medications.  If you need a refill on your cardiac medications before your next appointment, please call your pharmacy.   Follow-Up: At [facility], you and your health needs are our priority.  As part of our continuing mission to provide you with exceptional heart care, we have created designated Provider Care Teams.  These Care Teams include your primary Cardiologist (physician) and Advanced Practice Providers (APPs -  Physician Assistants and Nurse Practitioners) who all work together to provide you with the care you need, when you need it. You will need a follow up appointment in 6 months.  Please call our office 2 months in advance to schedule this appointment.  You may see Mark Skains, MD or one of the following Advanced Practice Providers on your designated Care Team:   Lori Gerhardt, NP Laura Ingold, NP . Jill McDaniel, NP  Thank you for choosing [facility]!!       

## 2018-09-17 NOTE — Progress Notes (Signed)
Virtual Visit via Telephone Note   This visit type was conducted due to national recommendations for restrictions regarding the COVID-19 Pandemic (e.g. social distancing) in an effort to limit this patient's exposure and mitigate transmission in our community.  Due to her co-morbid illnesses, this patient is at least at moderate risk for complications without adequate follow up.  This format is felt to be most appropriate for this patient at this time.  The patient did not have access to video technology/had technical difficulties with video requiring transitioning to audio format only (telephone).  All issues noted in this document were discussed and addressed.  No physical exam could be performed with this format.  Please refer to the patient's chart for her  consent to telehealth for [facility].   Date:  09/17/2018   ID:  [name], DOB July 16, 1945, MRN [id number]  Patient Location: Home Provider Location: Home  PCP:  Shirline Frees, MD  Cardiologist:  Candee Furbish, MD  Electrophysiologist:  None   Evaluation Performed:  Follow-Up Visit  Chief Complaint: Heart failure follow-up  History of Present Illness:    [name] is a 73 y.o. female with recent hospitalization in early April secondary to acute hypoxic respiratory failure thought to be secondary to chronic systolic heart failure.  She was diuresed with IV Lasix, eventually weaned off of oxygen.  EF 20% unchanged from 2015.  Has moderate mitral regurgitation.  Her discharge Lasix dose was 40 mg twice a day.  Previously somnolent lethargic on presentation thought to be hypoxemic.  We have seen some of this behavior previously here in the clinic.  Hepatic congestion was mild.  Has by the pacer.  Weight is approximately 145 pounds at home.  The patient does not have symptoms concerning for COVID-19 infection (fever, chills, cough, or new shortness of breath).    Past Medical History:  Diagnosis Date  . Anemia    . CAD (coronary artery disease)    Prior LAD stenting with stent thrombosis occurring in a withdrawal phase of her Plavix undertaken because of trigeminal neuralgia and release surgery. It was treated medically.  . Cancer ([place])    small bladder cancer removed no  treatment  . Cataract   . CHF (congestive heart failure) ([place])   . Complication of anesthesia    massive heart attack  . GERD (gastroesophageal reflux disease)   . Hyperlipidemia   . Ischemic cardiomyopathy   . Osteoarthritis   . SOB (shortness of breath)    Past Surgical History:  Procedure Laterality Date  . APPENDECTOMY    . BACK SURGERY     x2 back surgeries  . BI-VENTRICULAR IMPLANTABLE CARDIOVERTER DEFIBRILLATOR  (CRT-D)  2009; 05/2013   STJ CRTD generator change by Dr Caryl Comes 05/2013  . BIV PACEMAKER GENERATOR CHANGE OUT Bilateral 05/11/2013   Procedure: BIV PACEMAKER GENERATOR CHANGE OUT;  Surgeon: Deboraha Sprang, MD;  Location: [facility] CATH LAB;  Service: Cardiovascular;  Laterality: Bilateral;  . BRAIN SURGERY     x3  . EYE SURGERY Right 05/24/12  . JOINT REPLACEMENT     thumbs and knees replacement  . TUBAL LIGATION       Current Meds  Medication Sig  . acetaminophen (TYLENOL) 500 MG tablet Take 1,000 mg by mouth every 6 (six) hours as needed for headache (pain).  Marland Kitchen albuterol (PROAIR HFA) 108 (90 Base) MCG/ACT inhaler Inhale 2 puffs into the lungs every 6 (six) hours as needed for wheezing or shortness of breath.  Marland Kitchen  aspirin EC 81 MG tablet Take 81 mg by mouth every evening.   . carbamazepine (TEGRETOL) 100 MG chewable tablet Chew 100 mg by mouth 3 (three) times daily.   . carvedilol (COREG) 3.125 MG tablet TAKE ONE TABLET BY MOUTH TWICE DAILY WITH A MEAL  . Ferrous Sulfate 27 MG TABS Take 27 mg by mouth every evening.  . folic acid (FOLVITE) 1 MG tablet Take 1 tablet (1 mg total) by mouth daily.  . furosemide (LASIX) 80 MG tablet Take 0.5 tablets (40 mg total) by mouth 2 (two) times daily. Take 40 mg tab in morning  at 8am and then second tab in afternoon at 2pm  . gabapentin (NEURONTIN) 800 MG tablet Take 0.5 tablets (400 mg total) by mouth 3 (three) times daily. For pain (Patient taking differently: Take 800 mg by mouth 3 (three) times daily. For pain)  . levothyroxine (SYNTHROID) 100 MCG tablet Take 1/2 tablet on Fridays, take 1 tablet (100 mcg) on all other days of the week  . mirtazapine (REMERON) 45 MG tablet Take 45 mg by mouth at bedtime.  . nitroGLYCERIN (NITROSTAT) 0.4 MG SL tablet Dissolve ONE TABLET UNDER THE TONGUE AS DIRECTED FOR CHEST pain  . OVER THE COUNTER MEDICATION Apply 1 application topically daily as needed (knee pain). Veterinary liniment  . Oxycodone HCl 10 MG TABS Take 1 tablet (10 mg total) by mouth 2 (two) times daily as needed (pain).  . potassium chloride SA (K-DUR,KLOR-CON) 20 MEQ tablet TAKE ONE TABLET BY MOUTH DAILY  . promethazine (PHENERGAN) 25 MG tablet Take 25 mg by mouth every 6 (six) hours as needed for nausea or vomiting.   . ramipril (ALTACE) 2.5 MG capsule Take 1 capsule (2.5 mg total) by mouth daily.  . simvastatin (ZOCOR) 10 MG tablet TAKE ONE TABLET (10 MG) BY MOUTH EVERY DAY  . spironolactone (ALDACTONE) 25 MG tablet Take 0.5 tablets (12.5 mg total) by mouth daily.  Marland Kitchen venlafaxine XR (EFFEXOR-XR) 150 MG 24 hr capsule Take 300 mg by mouth daily with breakfast.    Current Facility-Administered Medications for the 09/17/18 encounter (Telemedicine) with Jerline Pain, MD  Medication  . betamethasone acetate-betamethasone sodium phosphate (CELESTONE) injection 3 mg     Allergies:   Clorazepate dipotassium   Social History   Tobacco Use  . Smoking status: Current Some Day Smoker    Packs/day: 0.50    Years: 30.00    Pack years: 15.00    Types: Cigarettes  . Smokeless tobacco: Never Used  Substance Use Topics  . Alcohol use: No  . Drug use: No     Family Hx: The patient's family history includes Heart disease in her father.  ROS:   Please see the  history of present illness.    No fevers chills nausea vomiting syncope bleeding All other systems reviewed and are negative.   Prior CV studies:   The following studies were reviewed today:  Echocardiogram EF 20% unchanged  Labs/Other Tests and Data Reviewed:    EKG:   06/2018 was personally reviewed today and demonstrated:  Paced rhythm  Recent Labs: 07/10/2018: B Natriuretic Peptide 1,778.4 07/12/2018: ALT 32; Hemoglobin 11.0; Platelets 235 07/13/2018: Magnesium 2.1 07/14/2018: BUN 15; Creatinine, Ser 0.90; Potassium 4.0; Sodium 142 08/31/2018: TSH 4.05   Recent Lipid Panel No results found for: CHOL, TRIG, HDL, CHOLHDL, LDLCALC, LDLDIRECT  Wt Readings from Last 3 Encounters:  09/17/18 150 lb (68 kg)  07/20/18 145 lb (65.8 kg)  07/14/18 148 lb 6.4 oz (  67.3 kg)     Objective:    Vital Signs:  BP 110/66   Pulse 77   Ht 5\' 3"  (1.6 m)   Wt 150 lb (68 kg)   LMP  (LMP Unknown)   BMI 26.57 kg/m    VITAL SIGNS:  reviewed pleasant, alert, able to complete full sentences without difficulty  ASSESSMENT & PLAN:    Chronic systolic heart failure -Recent hospitalization.  Lasix now 40 mg twice a day.  Diurese with IV Lasix.  EF unchanged to 20%.  Prior hypotension.  Not a candidate for Entresto based upon blood pressure. -Creatinine 0.9, hemoglobin 11.  We will continue with current plan.  Low-dose beta-blocker.  Low-dose ramipril.  Spironolactone.  No changes made. -She understands to take an additional 40 mg of Lasix if her weight increases 3 to 4 pounds.  We are going to try to maintain her weight at 145.  It is up a little bit.  She is eating more tomatoes with salt.  Watch her fluids.  Sometimes she will skip her Lasix.  Biventricular ICD - Functioning normally.  Monitoring fluid status.  Dr. Caryl Comes.  She is wondering if her device is MRI compatible.  I asked her to discuss this with the device team.  Tobacco use - Continue to encourage cessation.  Discussed this again today.   Trigeminal neuralgia, chronic pain - She had dismissed herself from the chronic pain clinic previously.  States that her face was hurting today and overnight. Like a hair on face.   COVID-19 Education: The signs and symptoms of COVID-19 were discussed with the patient and how to seek care for testing (follow up with PCP or arrange E-visit).  The importance of social distancing was discussed today.  Time:   Today, I have spent 13 minutes with the patient with telehealth technology discussing the above problems.     Medication Adjustments/Labs and Tests Ordered: Current medicines are reviewed at length with the patient today.  Concerns regarding medicines are outlined above.   Tests Ordered: No orders of the defined types were placed in this encounter.   Medication Changes: No orders of the defined types were placed in this encounter.   Follow Up:  Virtual Visit or In Person in 6 month(s)  Signed, Candee Furbish, MD  09/17/2018 1:41 PM    [place]

## 2018-09-22 DIAGNOSIS — F339 Major depressive disorder, recurrent, unspecified: Secondary | ICD-10-CM | POA: Diagnosis not present

## 2018-10-11 ENCOUNTER — Ambulatory Visit (INDEPENDENT_AMBULATORY_CARE_PROVIDER_SITE_OTHER): Payer: Medicare Other

## 2018-10-11 DIAGNOSIS — I5022 Chronic systolic (congestive) heart failure: Secondary | ICD-10-CM

## 2018-10-11 DIAGNOSIS — Z95810 Presence of automatic (implantable) cardiac defibrillator: Secondary | ICD-10-CM | POA: Diagnosis not present

## 2018-10-12 ENCOUNTER — Telehealth: Payer: Self-pay

## 2018-10-12 ENCOUNTER — Ambulatory Visit: Payer: Medicare Other | Admitting: *Deleted

## 2018-10-12 NOTE — Progress Notes (Signed)
EPIC Encounter for ICM Monitoring  Patient Name: [name] is a 73 y.o. female Date: 10/12/2018 Primary Care Physican: Shirline Frees, MD Primary Cardiologist:Skains Electrophysiologist: Vergie Living Pacing: 94% 09/07/2018 Weight: 148 lbs   Attempted call to patient and unable to reach.   Transmission reviewed.   CorVue thoracic impedance normal.   Prescribed:Furosemide80 mg take 0.5 tablets (40 mg total) by mouth 2 (two) times daily. Take 40 mg tab in morning at 8am and then second tab in afternoon at 2pm.  Recommendations:Unable to reach.    Follow-up plan: ICM clinic phone appointment on8/17/2020  Copy of ICM check sent to Dr.Klein.  3 month ICM trend: 10/11/2018    1 Year ICM trend:       [name], RN 10/12/2018 4:30 PM

## 2018-10-12 NOTE — Telephone Encounter (Signed)
Remote ICM transmission received.  Attempted call to patient regarding ICM remote transmission and no answer.  

## 2018-10-13 ENCOUNTER — Other Ambulatory Visit: Payer: Self-pay

## 2018-10-13 LAB — CUP PACEART REMOTE DEVICE CHECK
Date Time Interrogation Session: 20200715092329
Implantable Lead Implant Date: 20090429
Implantable Lead Implant Date: 20090429
Implantable Lead Implant Date: 20090429
Implantable Lead Location: 753858
Implantable Lead Location: 753859
Implantable Lead Location: 753860
Implantable Lead Model: 7120
Implantable Pulse Generator Implant Date: 20150211
Pulse Gen Serial Number: 7170199

## 2018-10-13 MED ORDER — CARVEDILOL 3.125 MG PO TABS: 3.1250 mg | ORAL_TABLET | Freq: Two times a day (BID) | ORAL | 3 refills | Status: DC

## 2018-11-04 DIAGNOSIS — G44091 Other trigeminal autonomic cephalgias (TAC), intractable: Secondary | ICD-10-CM | POA: Diagnosis not present

## 2018-11-04 DIAGNOSIS — R202 Paresthesia of skin: Secondary | ICD-10-CM | POA: Diagnosis not present

## 2018-11-16 ENCOUNTER — Telehealth: Payer: Self-pay

## 2018-11-16 NOTE — Telephone Encounter (Signed)
Left message for patient to remind of missed remote transmission.  

## 2018-11-18 ENCOUNTER — Other Ambulatory Visit: Payer: Self-pay

## 2018-11-18 ENCOUNTER — Other Ambulatory Visit: Payer: Self-pay | Admitting: Family Medicine

## 2018-11-18 ENCOUNTER — Ambulatory Visit
Admission: RE | Admit: 2018-11-18 | Discharge: 2018-11-18 | Disposition: A | Discharge status: Home / Self Care | Payer: Medicare Other | Source: Ambulatory Visit | Attending: Family Medicine | Admitting: Family Medicine

## 2018-11-18 DIAGNOSIS — R0789 Other chest pain: Secondary | ICD-10-CM | POA: Diagnosis not present

## 2018-11-19 NOTE — Progress Notes (Signed)
No ICM remote transmission received for 11/15/2018 and next ICM transmission scheduled for 01/05/2019.

## 2018-12-08 ENCOUNTER — Other Ambulatory Visit: Payer: Self-pay

## 2018-12-08 MED ORDER — POTASSIUM CHLORIDE CRYS ER 20 MEQ PO TBCR: 20.0000 meq | EXTENDED_RELEASE_TABLET | Freq: Every day | ORAL | 8 refills | Status: AC

## 2018-12-31 ENCOUNTER — Other Ambulatory Visit: Payer: Medicare Other

## 2019-01-05 ENCOUNTER — Encounter: Payer: Self-pay | Admitting: Endocrinology

## 2019-01-05 ENCOUNTER — Other Ambulatory Visit: Payer: Self-pay

## 2019-01-05 ENCOUNTER — Encounter: Payer: Medicare Other | Admitting: Endocrinology

## 2019-01-05 NOTE — Progress Notes (Signed)
This encounter was created in error - please disregard.

## 2019-01-06 ENCOUNTER — Telehealth: Payer: Self-pay

## 2019-01-06 NOTE — Telephone Encounter (Signed)
Left message for patient to remind of missed remote transmission.  

## 2019-01-10 NOTE — Progress Notes (Signed)
No ICM remote transmission received for 01/05/2019 and next ICM transmission scheduled for 01/31/2019.

## 2019-01-18 ENCOUNTER — Other Ambulatory Visit: Payer: Self-pay | Admitting: Cardiology

## 2019-01-18 NOTE — Telephone Encounter (Signed)
Pt's medication was sent to pt's pharmacy as requested. Confirmation received.  °

## 2019-01-19 ENCOUNTER — Other Ambulatory Visit: Payer: Medicare Other

## 2019-01-21 ENCOUNTER — Encounter: Payer: Medicare Other | Admitting: Endocrinology

## 2019-01-21 ENCOUNTER — Encounter: Payer: Self-pay | Admitting: Endocrinology

## 2019-01-21 ENCOUNTER — Other Ambulatory Visit: Payer: Self-pay

## 2019-01-22 NOTE — Progress Notes (Signed)
This encounter was created in error - please disregard.

## 2019-01-26 DIAGNOSIS — G44091 Other trigeminal autonomic cephalgias (TAC), intractable: Secondary | ICD-10-CM | POA: Diagnosis not present

## 2019-01-26 DIAGNOSIS — R202 Paresthesia of skin: Secondary | ICD-10-CM | POA: Diagnosis not present

## 2019-01-27 ENCOUNTER — Other Ambulatory Visit: Payer: Medicare Other

## 2019-01-28 ENCOUNTER — Other Ambulatory Visit: Payer: Self-pay

## 2019-01-28 ENCOUNTER — Other Ambulatory Visit (INDEPENDENT_AMBULATORY_CARE_PROVIDER_SITE_OTHER): Payer: Medicare Other

## 2019-01-28 DIAGNOSIS — E890 Postprocedural hypothyroidism: Secondary | ICD-10-CM

## 2019-01-28 NOTE — Addendum Note (Signed)
Addended by: Kaylyn Lim I on: 01/28/2019 04:32 PM   Modules accepted: Orders

## 2019-01-29 LAB — T4, FREE: Free T4: 1.12 ng/dL (ref 0.82–1.77)

## 2019-01-29 LAB — TSH: TSH: 15.2 u[IU]/mL — ABNORMAL HIGH (ref 0.450–4.500)

## 2019-02-01 ENCOUNTER — Encounter: Payer: Self-pay | Admitting: Endocrinology

## 2019-02-01 ENCOUNTER — Ambulatory Visit (INDEPENDENT_AMBULATORY_CARE_PROVIDER_SITE_OTHER): Payer: Medicare Other | Admitting: Endocrinology

## 2019-02-01 ENCOUNTER — Telehealth: Payer: Self-pay

## 2019-02-01 ENCOUNTER — Other Ambulatory Visit: Payer: Self-pay

## 2019-02-01 VITALS — Ht 63.0 in

## 2019-02-01 DIAGNOSIS — E890 Postprocedural hypothyroidism: Secondary | ICD-10-CM | POA: Diagnosis not present

## 2019-02-01 MED ORDER — LEVOTHYROXINE SODIUM 112 MCG PO TABS: 112.0000 ug | ORAL_TABLET | Freq: Every day | ORAL | 3 refills | Status: DC

## 2019-02-01 NOTE — Telephone Encounter (Signed)
Spoke with patient to remind of missed remote transmission 

## 2019-02-01 NOTE — Progress Notes (Signed)
Patient ID: [name], female   DOB: [birth year], 73 y.o.   MRN: [id number]    Today's office visit was provided via telemedicine using audio technique Explained to the patient and the the limitations of evaluation and management by telemedicine and the availability of in person appointments.  The patient understood the limitations and agreed to proceed. Patient also understood that the telehealth visit is billable. . Location of the patient: Home . Location of the provider: Office Only the patient and myself were participating in the encounter    Reason for Appointment:  Post ablative hypothyroidism, followup  History of Present Illness:    She became hypothyroid in 03/2012 following radioactive iodine treatment for hyperthyroidism in 11/13  She was initially started with 75 mcg of levothyroxine but with her TSH being nearly 50 in 3/14 the dose was increased to 137 mcg Subsequently has required lower doses   She has usually no change in her physical symptoms with variable thyroid levels or dosage changes However some of this may be because of her difficulty with remembering symptoms  She had not followed up for over a year when she was seen in 8/19 and her TSH was suppressed at 0.03 with relatively higher free T4 At that time she had no new complaints such as palpitations, jitteriness or shakiness Her dosage at that time was 112 mcg  RECENT HISTORY:  In 2019 her dose was changed to 100 mcg levothyroxine She is now taking 6-1/2 tablets a week with 1/2 tablet on Fridays  She confirmed that she is taking 100 mcg prescription Also confirmed that she is taking her thyroid pills before breakfast daily without missing any doses and doing half tablet on Fridays  She does not think her weight has changed since June She does complain of feeling more tired She does have only occasional diarrhea about once a week but no nausea or vomiting Does not complain of cold  intolerance  TSH is now 15.2, previously 4.0   Wt Readings from Last 3 Encounters:  09/17/18 150 lb (68 kg)  07/20/18 145 lb (65.8 kg)  07/14/18 148 lb 6.4 oz (67.3 kg)   Labs:  Lab Results  Component Value Date   TSH 15.200 (H) 01/28/2019   TSH 4.05 08/31/2018   TSH 3.040 07/10/2018   FREET4 1.12 01/28/2019   FREET4 0.77 08/31/2018   FREET4 0.99 01/12/2018       Allergies as of 02/01/2019      Reactions   Clorazepate Dipotassium Other (See Comments)   Caused confusion per husband      Medication List       Accurate as of February 01, 2019  8:44 AM. If you have any questions, ask your nurse or doctor.        acetaminophen 500 MG tablet Commonly known as: TYLENOL Take 1,000 mg by mouth every 6 (six) hours as needed for headache (pain).   aspirin EC 81 MG tablet Take 81 mg by mouth every evening.   carbamazepine 100 MG chewable tablet Commonly known as: TEGRETOL Chew 100 mg by mouth 3 (three) times daily.   carvedilol 3.125 MG tablet Commonly known as: COREG Take 1 tablet (3.125 mg total) by mouth 2 (two) times daily with a meal.   Ferrous Sulfate 27 MG Tabs Take 27 mg by mouth every evening.   folic acid 1 MG tablet Commonly known as: FOLVITE Take 1 tablet (1 mg total) by mouth daily.   furosemide 80 MG  tablet Commonly known as: LASIX Take 0.5 tablets (40 mg total) by mouth 2 (two) times daily. Take 40 mg tab in morning at 8am and then second tab in afternoon at 2pm   gabapentin 800 MG tablet Commonly known as: NEURONTIN Take 0.5 tablets (400 mg total) by mouth 3 (three) times daily. For pain What changed: how much to take   levothyroxine 100 MCG tablet Commonly known as: SYNTHROID Take 1/2 tablet on Fridays, take 1 tablet (100 mcg) on all other days of the week   mirtazapine 45 MG tablet Commonly known as: REMERON Take 45 mg by mouth at bedtime.   nitroGLYCERIN 0.4 MG SL tablet Commonly known as: NITROSTAT Dissolve ONE TABLET UNDER THE  TONGUE AS DIRECTED FOR CHEST pain   OVER THE COUNTER MEDICATION Apply 1 application topically daily as needed (knee pain). Veterinary liniment   Oxycodone HCl 10 MG Tabs Take 1 tablet (10 mg total) by mouth 2 (two) times daily as needed (pain).   potassium chloride SA 20 MEQ tablet Commonly known as: KLOR-CON Take 1 tablet (20 mEq total) by mouth daily.   ProAir HFA 108 (90 Base) MCG/ACT inhaler Generic drug: albuterol Inhale 2 puffs into the lungs every 6 (six) hours as needed for wheezing or shortness of breath.   promethazine 25 MG tablet Commonly known as: PHENERGAN Take 25 mg by mouth every 6 (six) hours as needed for nausea or vomiting.   ramipril 2.5 MG capsule Commonly known as: ALTACE Take 1 capsule (2.5 mg total) by mouth daily.   simvastatin 10 MG tablet Commonly known as: ZOCOR Take 1 tablet by mouth once daily   spironolactone 25 MG tablet Commonly known as: ALDACTONE Take 0.5 tablets (12.5 mg total) by mouth daily.   venlafaxine XR 150 MG 24 hr capsule Commonly known as: EFFEXOR-XR Take 300 mg by mouth daily with breakfast.           Past Medical History:  Diagnosis Date  . Anemia   . CAD (coronary artery disease)    Prior LAD stenting with stent thrombosis occurring in a withdrawal phase of her Plavix undertaken because of trigeminal neuralgia and release surgery. It was treated medically.  . Cancer ([place])    small bladder cancer removed no  treatment  . Cataract   . CHF (congestive heart failure) ([place])   . Complication of anesthesia    massive heart attack  . GERD (gastroesophageal reflux disease)   . Hyperlipidemia   . Ischemic cardiomyopathy   . Osteoarthritis   . SOB (shortness of breath)     Past Surgical History:  Procedure Laterality Date  . APPENDECTOMY    . BACK SURGERY     x2 back surgeries  . BI-VENTRICULAR IMPLANTABLE CARDIOVERTER DEFIBRILLATOR  (CRT-D)  2009; 05/2013   STJ CRTD generator change by Dr Caryl Comes 05/2013  . BIV  PACEMAKER GENERATOR CHANGE OUT Bilateral 05/11/2013   Procedure: BIV PACEMAKER GENERATOR CHANGE OUT;  Surgeon: Deboraha Sprang, MD;  Location: [facility] CATH LAB;  Service: Cardiovascular;  Laterality: Bilateral;  . BRAIN SURGERY     x3  . EYE SURGERY Right 05/24/12  . JOINT REPLACEMENT     thumbs and knees replacement  . TUBAL LIGATION      Family History  Problem Relation Age of Onset  . Heart disease Father     Social History:  reports that she has been smoking cigarettes. She has a 15.00 pack-year smoking history. She has never used smokeless tobacco. She reports  that she does not drink alcohol or use drugs.  Allergies:  Allergies  Allergen Reactions  . Clorazepate Dipotassium Other (See Comments)    Caused confusion per husband    Review of Systems:  CARDIOLOGY: she has history of coronary artery disease with multiple events and interventions  She is on low-dose carvedilol and Lasix for CHF         Examination:   Ht 5\' 3"  (1.6 m)   LMP  (LMP Unknown)   BMI 26.57 kg/m   Patient is remote, no exam done     Assessment   Post ablative hypothyroidism:   She had required lower doses of supplementation for her thyroid in 2019  Now with taking 100 mcg levothyroxine 6-1/2 days a week her TSH is unusually high at 15 She does complain of some fatigue but not clear if this is entirely new Her weight is really the same  Appears not to be taking any interacting supplements or medications at the same time as levothyroxine and has been very consistent with taking her supplement reportedly  She will increase her dosage to 112 mcg and take 1 tablet every morning  Follow-up in 2 months  Duration of telephone encounter =5 minutes  Elayne Snare 02/01/2019, 8:44 AM

## 2019-02-04 NOTE — Progress Notes (Signed)
No ICM remote transmission received for 01/31/2019 and next ICM transmission scheduled for 03/07/2019.

## 2019-02-14 ENCOUNTER — Encounter: Payer: Self-pay | Admitting: Internal Medicine

## 2019-02-14 ENCOUNTER — Other Ambulatory Visit: Payer: Self-pay

## 2019-02-14 ENCOUNTER — Ambulatory Visit: Payer: Medicare Other | Admitting: Internal Medicine

## 2019-02-14 VITALS — BP 118/74 | HR 71 | Ht 63.0 in | Wt 150.0 lb

## 2019-02-14 DIAGNOSIS — I255 Ischemic cardiomyopathy: Secondary | ICD-10-CM | POA: Diagnosis not present

## 2019-02-14 DIAGNOSIS — Z23 Encounter for immunization: Secondary | ICD-10-CM | POA: Diagnosis not present

## 2019-02-14 DIAGNOSIS — Z95810 Presence of automatic (implantable) cardiac defibrillator: Secondary | ICD-10-CM | POA: Diagnosis not present

## 2019-02-14 DIAGNOSIS — I5022 Chronic systolic (congestive) heart failure: Secondary | ICD-10-CM

## 2019-02-14 LAB — CUP PACEART INCLINIC DEVICE CHECK
Battery Remaining Longevity: 19 mo
Brady Statistic RA Percent Paced: 11 %
Brady Statistic RV Percent Paced: 95 %
Date Time Interrogation Session: 20201116182909
HighPow Impedance: 36.54 Ohm
Implantable Lead Implant Date: 20090429
Implantable Lead Implant Date: 20090429
Implantable Lead Implant Date: 20090429
Implantable Lead Location: 753858
Implantable Lead Location: 753859
Implantable Lead Location: 753860
Implantable Lead Model: 7120
Implantable Pulse Generator Implant Date: 20150211
Lead Channel Impedance Value: 287.5 Ohm
Lead Channel Impedance Value: 462.5 Ohm
Lead Channel Impedance Value: 487.5 Ohm
Lead Channel Pacing Threshold Amplitude: 0.75 V
Lead Channel Pacing Threshold Amplitude: 0.75 V
Lead Channel Pacing Threshold Amplitude: 1 V
Lead Channel Pacing Threshold Pulse Width: 0.5 ms
Lead Channel Pacing Threshold Pulse Width: 0.5 ms
Lead Channel Pacing Threshold Pulse Width: 1 ms
Lead Channel Sensing Intrinsic Amplitude: 11.7 mV
Lead Channel Sensing Intrinsic Amplitude: 2.2 mV
Lead Channel Setting Pacing Amplitude: 1.5 V
Lead Channel Setting Pacing Amplitude: 2 V
Lead Channel Setting Pacing Amplitude: 2.5 V
Lead Channel Setting Pacing Pulse Width: 0.5 ms
Lead Channel Setting Pacing Pulse Width: 1 ms
Lead Channel Setting Sensing Sensitivity: 0.5 mV
Pulse Gen Serial Number: 7170199

## 2019-02-14 MED ORDER — FUROSEMIDE 80 MG PO TABS: 80.0000 mg | ORAL_TABLET | Freq: Every day | ORAL | 0 refills | Status: DC

## 2019-02-14 NOTE — Patient Instructions (Signed)
Medication Instructions:  Please increase your Furosemide to 80 mg twice a day for 3 days then return to once daily. Continue all other medications as listed. *If you need a refill on your cardiac medications before your next appointment, please call your pharmacy*  Follow-Up: At [facility], you and your health needs are our priority.  As part of our continuing mission to provide you with exceptional heart care, we have created designated Provider Care Teams.  These Care Teams include your primary Cardiologist (physician) and Advanced Practice Providers (APPs -  Physician Assistants and Nurse Practitioners) who all work together to provide you with the care you need, when you need it.  Your next appointment:   12 months  The format for your next appointment:   In Person  Provider:   Virl Axe, MD  Thank you for choosing [facility]!!

## 2019-02-14 NOTE — Progress Notes (Signed)
Patient Care Team: Shirline Frees, MD as PCP - General (Family Medicine) Jerline Pain, MD as PCP - Cardiology (Cardiology)   HPI  [name] is a 73 y.o. female Seen in followup for ICD implantation. She has a CRT St. Jude device in 2009. She had a nonspecific IVCD. She does not recall significant interval benefit. She has a history of ischemic heart disease with prior MI   She continues to smoke.  She has chronic dyspnea.  She has developed peripheral edema over the last couple of weeks.  She denies exuberant salt or fluid intake.  No chest pain.  No palpitations.  DATE TEST EF   2/15 Echo   15-20 %   4/20 Echo   20-25 %         Date Cr K Hgb  4//20 0.9 4.0 11.0           Past Medical History:  Diagnosis Date  . Anemia   . CAD (coronary artery disease)    Prior LAD stenting with stent thrombosis occurring in a withdrawal phase of her Plavix undertaken because of trigeminal neuralgia and release surgery. It was treated medically.  . Cancer ([place])    small bladder cancer removed no  treatment  . Cataract   . CHF (congestive heart failure) ([place])   . Complication of anesthesia    massive heart attack  . GERD (gastroesophageal reflux disease)   . Hyperlipidemia   . Ischemic cardiomyopathy   . Osteoarthritis   . SOB (shortness of breath)     Past Surgical History:  Procedure Laterality Date  . APPENDECTOMY    . BACK SURGERY     x2 back surgeries  . BI-VENTRICULAR IMPLANTABLE CARDIOVERTER DEFIBRILLATOR  (CRT-D)  2009; 05/2013   STJ CRTD generator change by Dr Caryl Comes 05/2013  . BIV PACEMAKER GENERATOR CHANGE OUT Bilateral 05/11/2013   Procedure: BIV PACEMAKER GENERATOR CHANGE OUT;  Surgeon: Deboraha Sprang, MD;  Location: [facility] CATH LAB;  Service: Cardiovascular;  Laterality: Bilateral;  . BRAIN SURGERY     x3  . EYE SURGERY Right 05/24/12  . JOINT REPLACEMENT     thumbs and knees replacement  . TUBAL LIGATION      Current Outpatient Medications   Medication Sig Dispense Refill  . acetaminophen (TYLENOL) 500 MG tablet Take 1,000 mg by mouth every 6 (six) hours as needed for headache (pain).    Marland Kitchen albuterol (PROAIR HFA) 108 (90 Base) MCG/ACT inhaler Inhale 2 puffs into the lungs every 6 (six) hours as needed for wheezing or shortness of breath.    Marland Kitchen aspirin EC 81 MG tablet Take 81 mg by mouth every evening.     . carbamazepine (TEGRETOL) 100 MG chewable tablet Chew 100 mg by mouth 3 (three) times daily.   2  . carvedilol (COREG) 3.125 MG tablet Take 1 tablet (3.125 mg total) by mouth 2 (two) times daily with a meal. 180 tablet 3  . Ferrous Sulfate 27 MG TABS Take 27 mg by mouth every evening.    . folic acid (FOLVITE) 1 MG tablet Take 1 tablet (1 mg total) by mouth daily. 30 tablet 0  . furosemide (LASIX) 80 MG tablet Take 0.5 tablets (40 mg total) by mouth 2 (two) times daily. Take 40 mg tab in morning at 8am and then second tab in afternoon at 2pm 30 tablet 0  . gabapentin (NEURONTIN) 800 MG tablet Take 0.5 tablets (400 mg total) by mouth 3 (  three) times daily. For pain (Patient taking differently: Take 800 mg by mouth 3 (three) times daily. For pain)    . levothyroxine (SYNTHROID) 112 MCG tablet Take 1 tablet (112 mcg total) by mouth daily. 90 tablet 3  . mirtazapine (REMERON) 45 MG tablet Take 45 mg by mouth at bedtime.    . nitroGLYCERIN (NITROSTAT) 0.4 MG SL tablet Dissolve ONE TABLET UNDER THE TONGUE AS DIRECTED FOR CHEST pain 25 tablet 4  . OVER THE COUNTER MEDICATION Apply 1 application topically daily as needed (knee pain). Veterinary liniment    . Oxycodone HCl 10 MG TABS Take 1 tablet (10 mg total) by mouth 2 (two) times daily as needed (pain).  0  . potassium chloride SA (K-DUR) 20 MEQ tablet Take 1 tablet (20 mEq total) by mouth daily. 30 tablet 8  . promethazine (PHENERGAN) 25 MG tablet Take 25 mg by mouth every 6 (six) hours as needed for nausea or vomiting.     . ramipril (ALTACE) 2.5 MG capsule Take 1 capsule (2.5 mg total)  by mouth daily. 90 capsule 3  . simvastatin (ZOCOR) 10 MG tablet Take 1 tablet by mouth once daily 90 tablet 2  . spironolactone (ALDACTONE) 25 MG tablet Take 0.5 tablets (12.5 mg total) by mouth daily. 45 tablet 3  . venlafaxine XR (EFFEXOR-XR) 150 MG 24 hr capsule Take 300 mg by mouth daily with breakfast.   4   Current Facility-Administered Medications  Medication Dose Route Frequency Provider Last Rate Last Dose  . betamethasone acetate-betamethasone sodium phosphate (CELESTONE) injection 3 mg  3 mg Intramuscular Once Edrick Kins, DPM        Allergies  Allergen Reactions  . Clorazepate Dipotassium Other (See Comments)    Caused confusion per husband      Review of Systems negative except from HPI and PMH  Physical Exam BP 118/74   Pulse 71   Ht 5\' 3"  (1.6 m)   Wt 150 lb (68 kg)   LMP  (LMP Unknown)   SpO2 93%   BMI 26.57 kg/m  Well developed and well nourished in no acute distress hoarse voice  HENT normal Neck supple with JVP-flat Clear Device pocket well healed; without hematoma or erythema.  There is no tethering  Regular rate and rhythm,2/6  murmur Abd-soft with active BS No Clubbing cyanosis 2+ edema Skin-warm and dry A & Oriented  Grossly normal sensory and motor function  ECG sinus @ 71 19/14/44 Upright QRS V1 and qr lead 1  Assessment and  Plan Ischemic cardiomyopathy   congestive heart failure-chronic-systolic  Implantable defibrillator-CRT-St. Jude  The patient's device was interrogated.  The information was reviewed. No changes were made in the programming.     SVT-nonsustained   Cigarette abuse  Anemia   Without symptoms of ischemia  Still smoking  Volume overloaded  -denies exuberant salt or fluid intake.  We will increase her Lasix from 80 daily--80 twice daily x3 days.   I instructed her hemoglobin is only 11.0 given the fact that she smokes.  I have reached out to [facility] family practice to see if they have old hemoglobin data; we  do not have these data in our system  We spent more than 50% of our >25 min visit in face to face counseling regarding the above

## 2019-02-18 DIAGNOSIS — F339 Major depressive disorder, recurrent, unspecified: Secondary | ICD-10-CM | POA: Diagnosis not present

## 2019-03-07 ENCOUNTER — Ambulatory Visit (INDEPENDENT_AMBULATORY_CARE_PROVIDER_SITE_OTHER): Payer: Medicare Other | Admitting: *Deleted

## 2019-03-07 DIAGNOSIS — Z95810 Presence of automatic (implantable) cardiac defibrillator: Secondary | ICD-10-CM | POA: Diagnosis not present

## 2019-03-08 ENCOUNTER — Ambulatory Visit: Payer: Medicare Other

## 2019-03-08 ENCOUNTER — Telehealth: Payer: Self-pay

## 2019-03-08 NOTE — Telephone Encounter (Signed)
Left message for patient to remind of missed remote transmission.  

## 2019-03-10 LAB — CUP PACEART REMOTE DEVICE CHECK
Date Time Interrogation Session: 20201210061753
Implantable Lead Implant Date: 20090429
Implantable Lead Implant Date: 20090429
Implantable Lead Implant Date: 20090429
Implantable Lead Location: 753858
Implantable Lead Location: 753859
Implantable Lead Location: 753860
Implantable Lead Model: 7120
Implantable Pulse Generator Implant Date: 20150211
Pulse Gen Serial Number: 7170199

## 2019-03-16 NOTE — Progress Notes (Signed)
No ICM remote transmission received for 03/07/2019 and next ICM transmission scheduled for 04/18/2019.   

## 2019-03-21 ENCOUNTER — Ambulatory Visit: Payer: Medicare Other | Admitting: Cardiology

## 2019-03-29 DIAGNOSIS — M5441 Lumbago with sciatica, right side: Secondary | ICD-10-CM | POA: Diagnosis not present

## 2019-03-29 DIAGNOSIS — M5442 Lumbago with sciatica, left side: Secondary | ICD-10-CM | POA: Diagnosis not present

## 2019-03-29 DIAGNOSIS — I5022 Chronic systolic (congestive) heart failure: Secondary | ICD-10-CM | POA: Diagnosis not present

## 2019-04-03 NOTE — Progress Notes (Signed)
ICD remote 

## 2019-04-04 DIAGNOSIS — F5101 Primary insomnia: Secondary | ICD-10-CM | POA: Diagnosis not present

## 2019-04-04 DIAGNOSIS — J449 Chronic obstructive pulmonary disease, unspecified: Secondary | ICD-10-CM | POA: Diagnosis not present

## 2019-04-04 DIAGNOSIS — F419 Anxiety disorder, unspecified: Secondary | ICD-10-CM | POA: Diagnosis not present

## 2019-04-04 DIAGNOSIS — G894 Chronic pain syndrome: Secondary | ICD-10-CM | POA: Diagnosis not present

## 2019-04-05 ENCOUNTER — Other Ambulatory Visit: Payer: Medicare Other

## 2019-04-06 ENCOUNTER — Other Ambulatory Visit: Payer: Medicare Other

## 2019-04-08 ENCOUNTER — Ambulatory Visit: Payer: Medicare Other | Admitting: Endocrinology

## 2019-04-08 ENCOUNTER — Other Ambulatory Visit: Payer: Self-pay

## 2019-04-11 ENCOUNTER — Other Ambulatory Visit: Payer: Self-pay | Admitting: Cardiology

## 2019-04-11 MED ORDER — SPIRONOLACTONE 25 MG PO TABS: 12.5000 mg | ORAL_TABLET | Freq: Every day | ORAL | 1 refills | Status: DC

## 2019-04-11 NOTE — Telephone Encounter (Signed)
Pt's medication was sent to her pharmacy as requested. Confirmation received.

## 2019-04-18 ENCOUNTER — Ambulatory Visit (INDEPENDENT_AMBULATORY_CARE_PROVIDER_SITE_OTHER): Payer: Medicare Other

## 2019-04-18 ENCOUNTER — Telehealth: Payer: Self-pay

## 2019-04-18 DIAGNOSIS — I5022 Chronic systolic (congestive) heart failure: Secondary | ICD-10-CM | POA: Diagnosis not present

## 2019-04-18 DIAGNOSIS — Z95810 Presence of automatic (implantable) cardiac defibrillator: Secondary | ICD-10-CM | POA: Diagnosis not present

## 2019-04-18 NOTE — Telephone Encounter (Signed)
Remote ICM transmission received.  Attempted call to patient regarding ICM remote transmission and no answer or answering machine. 

## 2019-04-18 NOTE — Progress Notes (Signed)
EPIC Encounter for ICM Monitoring  Patient Name: [name] is a 74 y.o. female Date: 04/18/2019 Primary Care Physican: Shirline Frees, MD Primary Cardiologist:[place] Electrophysiologist: Vergie Living Pacing: 94% 02/14/2019 Weight: 150 lbs (office weight)   Attempted call to patient and unable to reach.   Transmission reviewed.   CorVue thoracic impedance suggesting possible ongoing fluid accumulation since 04/16/2019 but starting to trend toward baseline.    Prescribed:Furosemide80 mgtake 1 tablet (80 mg total) by mouth daily.   Labs: 07/14/2018 Creatinine 0.90, BUN 15, Potassium 4.0, Sodium 142, GFR >60  Recommendations: Unable to reach.    Follow-up plan: ICM clinic phone appointment on 04/26/2019 to recheck fluid levels.   91 day device clinic remote transmission 07/19/2019.      Copy of ICM check sent to Dr. Caryl Comes and Dr Marlou Porch.   3 month ICM trend: 04/18/2019    1 Year ICM trend:       [name], RN 04/18/2019 10:08 AM

## 2019-04-26 ENCOUNTER — Ambulatory Visit (INDEPENDENT_AMBULATORY_CARE_PROVIDER_SITE_OTHER): Payer: Medicare Other

## 2019-04-26 DIAGNOSIS — Z95810 Presence of automatic (implantable) cardiac defibrillator: Secondary | ICD-10-CM

## 2019-04-26 DIAGNOSIS — I5022 Chronic systolic (congestive) heart failure: Secondary | ICD-10-CM

## 2019-04-26 NOTE — Progress Notes (Signed)
EPIC Encounter for ICM Monitoring  Patient Name: [name] is a 74 y.o. female Date: 04/26/2019 Primary Care Physican: Shirline Frees, MD Primary Cardiologist:Skains Electrophysiologist: Vergie Living Pacing: 98% 02/14/2019 Weight: 150 lbs (office weight)   Transmission reviewed.   CorVue thoracic impedance returned to baseline since 04/18/2019 remote transmission.    Prescribed:Furosemide80 mgtake 1 tablet (80 mg total) by mouth daily.   Labs: 07/14/2018 Creatinine 0.90, BUN 15, Potassium 4.0, Sodium 142, GFR >60  Recommendations:  None  Follow-up plan: ICM clinic phone appointment on 05/23/2019.   91 day device clinic remote transmission 07/19/2019.      Copy of ICM check sent to Dr. Caryl Comes and Dr Marlou Porch.   3 month ICM trend: 04/26/2019    1 Year ICM trend:       [name], RN 04/26/2019 2:28 PM

## 2019-04-27 ENCOUNTER — Other Ambulatory Visit: Payer: Self-pay

## 2019-04-27 ENCOUNTER — Other Ambulatory Visit (INDEPENDENT_AMBULATORY_CARE_PROVIDER_SITE_OTHER): Payer: Medicare Other

## 2019-04-27 DIAGNOSIS — E890 Postprocedural hypothyroidism: Secondary | ICD-10-CM | POA: Diagnosis not present

## 2019-04-27 LAB — T4, FREE: Free T4: 0.78 ng/dL (ref 0.60–1.60)

## 2019-04-27 LAB — TSH: TSH: 0.13 u[IU]/mL — ABNORMAL LOW (ref 0.35–4.50)

## 2019-05-02 ENCOUNTER — Other Ambulatory Visit: Payer: Self-pay

## 2019-05-02 ENCOUNTER — Ambulatory Visit (INDEPENDENT_AMBULATORY_CARE_PROVIDER_SITE_OTHER): Payer: Medicare Other | Admitting: Endocrinology

## 2019-05-02 DIAGNOSIS — E890 Postprocedural hypothyroidism: Secondary | ICD-10-CM

## 2019-05-02 NOTE — Progress Notes (Signed)
Patient ID: [name], female   DOB: [birth year], 75 y.o.   MRN: [id number]    Today's office visit was provided via telemedicine using audio technique Explained to the patient and the the limitations of evaluation and management by telemedicine and the availability of in person appointments.  The patient understood the limitations and agreed to proceed. Patient also understood that the telehealth visit is billable. . Location of the patient: Home . Location of the provider: Office Only the patient and myself were participating in the encounter    Reason for Appointment:  Post ablative hypothyroidism, followup  History of Present Illness:    She became hypothyroid in 03/2012 following radioactive iodine treatment for hyperthyroidism in 11/13  She was initially started with 75 mcg of levothyroxine but with her TSH being nearly 50 in 3/14 the dose was increased to 137 mcg Subsequently has required lower doses   She has usually no change in her physical symptoms with variable thyroid levels or dosage changes However some of this may be because of her difficulty with remembering symptoms  She had not followed up for over a year when she was seen in 8/19 and her TSH was suppressed at 0.03 with relatively higher free T4 At that time she had no new complaints such as palpitations, jitteriness or shakiness Her dosage at that time was 112 mcg  RECENT HISTORY:  In 2019 her dose was changed to 100 mcg levothyroxine However because of her TSH being 15 in 12/2018 she is now taking 112 mcg  She confirmed that she is taking 112 mcg prescription which is a pink tablet Her husband helps her with her medication routine She is taking her levothyroxine before breakfast daily She has not taken any iron at the same time and is not missing any doses  Her weight may have gone up to about 155 pounds recently She does not remember if she felt any better with increasing her dosage last  November However she does tend to have persistent fatigue which is not any worse No palpitations or shakiness   TSH is now 0.13 compared to 15.2   Wt Readings from Last 3 Encounters:  02/14/19 150 lb (68 kg)  09/17/18 150 lb (68 kg)  07/20/18 145 lb (65.8 kg)   Labs:  Lab Results  Component Value Date   TSH 0.13 (L) 04/27/2019   TSH 15.200 (H) 01/28/2019   TSH 4.05 08/31/2018   FREET4 0.78 04/27/2019   FREET4 1.12 01/28/2019   FREET4 0.77 08/31/2018       Allergies as of 05/02/2019      Reactions   Clorazepate Dipotassium Other (See Comments)   Caused confusion per husband      Medication List       Accurate as of May 02, 2019 12:45 PM. If you have any questions, ask your nurse or doctor.        acetaminophen 500 MG tablet Commonly known as: TYLENOL Take 1,000 mg by mouth every 6 (six) hours as needed for headache (pain).   aspirin EC 81 MG tablet Take 81 mg by mouth every evening.   carbamazepine 100 MG chewable tablet Commonly known as: TEGRETOL Chew 100 mg by mouth 3 (three) times daily.   carvedilol 3.125 MG tablet Commonly known as: COREG Take 1 tablet (3.125 mg total) by mouth 2 (two) times daily with a meal.   Ferrous Sulfate 27 MG Tabs Take 27 mg by mouth every evening.   folic  acid 1 MG tablet Commonly known as: FOLVITE Take 1 tablet (1 mg total) by mouth daily.   furosemide 80 MG tablet Commonly known as: LASIX Take 1 tablet (80 mg total) by mouth daily.   gabapentin 800 MG tablet Commonly known as: NEURONTIN Take 0.5 tablets (400 mg total) by mouth 3 (three) times daily. For pain What changed: how much to take   levothyroxine 112 MCG tablet Commonly known as: SYNTHROID Take 1 tablet (112 mcg total) by mouth daily.   mirtazapine 45 MG tablet Commonly known as: REMERON Take 45 mg by mouth at bedtime.   nitroGLYCERIN 0.4 MG SL tablet Commonly known as: NITROSTAT Dissolve ONE TABLET UNDER THE TONGUE AS DIRECTED FOR CHEST  pain   OVER THE COUNTER MEDICATION Apply 1 application topically daily as needed (knee pain). Veterinary liniment   Oxycodone HCl 10 MG Tabs Take 1 tablet (10 mg total) by mouth 2 (two) times daily as needed (pain).   potassium chloride SA 20 MEQ tablet Commonly known as: KLOR-CON Take 1 tablet (20 mEq total) by mouth daily.   ProAir HFA 108 (90 Base) MCG/ACT inhaler Generic drug: albuterol Inhale 2 puffs into the lungs every 6 (six) hours as needed for wheezing or shortness of breath.   promethazine 25 MG tablet Commonly known as: PHENERGAN Take 25 mg by mouth every 6 (six) hours as needed for nausea or vomiting.   ramipril 2.5 MG capsule Commonly known as: ALTACE Take 1 capsule (2.5 mg total) by mouth daily.   simvastatin 10 MG tablet Commonly known as: ZOCOR Take 1 tablet by mouth once daily   spironolactone 25 MG tablet Commonly known as: ALDACTONE Take 0.5 tablets (12.5 mg total) by mouth daily.   venlafaxine XR 150 MG 24 hr capsule Commonly known as: EFFEXOR-XR Take 300 mg by mouth daily with breakfast.           Past Medical History:  Diagnosis Date  . Anemia   . CAD (coronary artery disease)    Prior LAD stenting with stent thrombosis occurring in a withdrawal phase of her Plavix undertaken because of trigeminal neuralgia and release surgery. It was treated medically.  . Cancer ([place])    small bladder cancer removed no  treatment  . Cataract   . CHF (congestive heart failure) ([place])   . Complication of anesthesia    massive heart attack  . GERD (gastroesophageal reflux disease)   . Hyperlipidemia   . Ischemic cardiomyopathy   . Osteoarthritis   . SOB (shortness of breath)     Past Surgical History:  Procedure Laterality Date  . APPENDECTOMY    . BACK SURGERY     x2 back surgeries  . BI-VENTRICULAR IMPLANTABLE CARDIOVERTER DEFIBRILLATOR  (CRT-D)  2009; 05/2013   STJ CRTD generator change by Dr Caryl Comes 05/2013  . BIV PACEMAKER GENERATOR CHANGE OUT  Bilateral 05/11/2013   Procedure: BIV PACEMAKER GENERATOR CHANGE OUT;  Surgeon: Deboraha Sprang, MD;  Location: [facility] CATH LAB;  Service: Cardiovascular;  Laterality: Bilateral;  . BRAIN SURGERY     x3  . EYE SURGERY Right 05/24/12  . JOINT REPLACEMENT     thumbs and knees replacement  . TUBAL LIGATION      Family History  Problem Relation Age of Onset  . Heart disease Father     Social History:  reports that she has been smoking cigarettes. She has a 15.00 pack-year smoking history. She has never used smokeless tobacco. She reports that she does not drink  alcohol or use drugs.  Allergies:  Allergies  Allergen Reactions  . Clorazepate Dipotassium Other (See Comments)    Caused confusion per husband    Review of Systems:  CARDIOLOGY: she has history of coronary artery disease with multiple events and interventions  No recent palpitations or chest pain  She is on low-dose carvedilol and Lasix for CHF         Examination:   LMP  (LMP Unknown)        Assessment   Post ablative hypothyroidism:   She had required variable doses of supplementation for her thyroid since 2019  Now with taking 112 mcg levothyroxine her TSH is slightly low even though with taking 100 mcg, 6-1/2 days a week her TSH was unusually high at 15 She does not think she really noticed any difference with increasing the dose but she continues to have chronic fatigue of unclear etiology She has not changed the way she is taking her thyroid supplement No new medications or supplements being taken  She will reduce her dosage to 6-1/2 tablets of the 112 mcg starting tomorrow and take 1/2 tablet on Tuesdays She thinks that she can have her husband help her set up the regimen  Follow-up in 2 months  Duration of telephone encounter =5 minutes  Elayne Snare 05/02/2019, 12:45 PM

## 2019-05-03 ENCOUNTER — Encounter: Payer: Self-pay | Admitting: Endocrinology

## 2019-05-19 DIAGNOSIS — F339 Major depressive disorder, recurrent, unspecified: Secondary | ICD-10-CM | POA: Diagnosis not present

## 2019-05-24 ENCOUNTER — Telehealth: Payer: Self-pay

## 2019-05-24 NOTE — Telephone Encounter (Signed)
Left message for patient to remind of missed remote transmission.  

## 2019-05-26 DIAGNOSIS — J449 Chronic obstructive pulmonary disease, unspecified: Secondary | ICD-10-CM | POA: Diagnosis not present

## 2019-05-26 DIAGNOSIS — F329 Major depressive disorder, single episode, unspecified: Secondary | ICD-10-CM | POA: Diagnosis not present

## 2019-05-26 DIAGNOSIS — J441 Chronic obstructive pulmonary disease with (acute) exacerbation: Secondary | ICD-10-CM | POA: Diagnosis not present

## 2019-05-26 DIAGNOSIS — I2510 Atherosclerotic heart disease of native coronary artery without angina pectoris: Secondary | ICD-10-CM | POA: Diagnosis not present

## 2019-06-01 NOTE — Progress Notes (Signed)
No ICM remote transmission received for 05/23/2019 and next ICM transmission scheduled for 06/13/2019.

## 2019-06-17 NOTE — Progress Notes (Signed)
No ICM remote transmission received for 06/13/2019 and next ICM transmission scheduled for 07/11/2019.

## 2019-07-04 ENCOUNTER — Telehealth: Payer: Self-pay | Admitting: Internal Medicine

## 2019-07-04 ENCOUNTER — Other Ambulatory Visit: Payer: Self-pay | Admitting: Physician Assistant

## 2019-07-04 ENCOUNTER — Other Ambulatory Visit: Payer: Self-pay

## 2019-07-04 ENCOUNTER — Other Ambulatory Visit (INDEPENDENT_AMBULATORY_CARE_PROVIDER_SITE_OTHER): Payer: Medicare Other

## 2019-07-04 DIAGNOSIS — Z720 Tobacco use: Secondary | ICD-10-CM

## 2019-07-04 DIAGNOSIS — E890 Postprocedural hypothyroidism: Secondary | ICD-10-CM | POA: Diagnosis not present

## 2019-07-04 DIAGNOSIS — Z95810 Presence of automatic (implantable) cardiac defibrillator: Secondary | ICD-10-CM

## 2019-07-04 DIAGNOSIS — I255 Ischemic cardiomyopathy: Secondary | ICD-10-CM

## 2019-07-04 DIAGNOSIS — I10 Essential (primary) hypertension: Secondary | ICD-10-CM

## 2019-07-04 DIAGNOSIS — I5022 Chronic systolic (congestive) heart failure: Secondary | ICD-10-CM

## 2019-07-04 DIAGNOSIS — I25119 Atherosclerotic heart disease of native coronary artery with unspecified angina pectoris: Secondary | ICD-10-CM

## 2019-07-04 DIAGNOSIS — E785 Hyperlipidemia, unspecified: Secondary | ICD-10-CM

## 2019-07-04 MED ORDER — NITROGLYCERIN 0.4 MG SL SUBL: 0.4000 mg | SUBLINGUAL_TABLET | SUBLINGUAL | 2 refills | Status: AC | PRN

## 2019-07-04 NOTE — Telephone Encounter (Signed)
New message   *STAT* If patient is at the pharmacy, call can be transferred to refill team.   1. Which medications need to be refilled? (please list name of each medication and dose if known) nitroGLYCERIN (NITROSTAT) 0.4 MG SL tablet  2. Which pharmacy/location (including street and city if local pharmacy) is medication to be sent to? [place] ([place]), [place] [address]  3. Do they need a 30 day or 90 day supply? 30 day

## 2019-07-04 NOTE — Progress Notes (Signed)
Patient ID: [name], female   DOB: 05/27/1945, 74 y.o.   MRN: [id number]    Today's office visit was provided via telemedicine using audio technique Explained to the patient and the the limitations of evaluation and management by telemedicine and the availability of in person appointments.  The patient understood the limitations and agreed to proceed. Patient also understood that the telehealth visit is billable. . Location of the patient: Home . Location of the provider: Office Only the patient and myself were participating in the encounter    Reason for Appointment:  Post ablative hypothyroidism, followup + History of Present Illness:    She became hypothyroid in 03/2012 following radioactive iodine treatment for hyperthyroidism in 11/13  She was initially started with 75 mcg of levothyroxine but with her TSH being nearly 50 in 3/14 the dose was increased to 137 mcg Subsequently has required lower doses   She has usually no change in her physical symptoms with variable thyroid levels or dosage changes However some of this may be because of her difficulty with remembering symptoms  She had not followed up for over a year when she was seen in 8/19 and her TSH was suppressed at 0.03 with relatively higher free T4 At that time she had no new complaints such as palpitations, jitteriness or shakiness Her dosage at that time was 112 mcg  RECENT HISTORY:  In 2019 her dose was changed to 100 mcg levothyroxine However because of her TSH being 15 in 12/2018 she is taking 112 mcg  She confirmed that she is taking 112 mcg prescription from the medication bottle She also has taking only half a tablet on Tuesdays as instructed in February when her TSH was 0.13  However her TSH is again lower at 0.08 She thinks that she will be taking her levothyroxine much more regularly with the help of her husband compared to previously She may occasionally feel her heart going fast  but not unusually Not feeling shaky Although she thinks she may have somewhat decreased appetite she says her weight is 149, previously may have been a little higher  No unusual fatigue recently   Wt Readings from Last 3 Encounters:  02/14/19 150 lb (68 kg)  09/17/18 150 lb (68 kg)  07/20/18 145 lb (65.8 kg)   Labs:  Lab Results  Component Value Date   TSH 0.08 (L) 07/04/2019   TSH 0.13 (L) 04/27/2019   TSH 15.200 (H) 01/28/2019   FREET4 0.99 07/04/2019   FREET4 0.78 04/27/2019   FREET4 1.12 01/28/2019       Allergies as of 07/07/2019      Reactions   Clorazepate Dipotassium Other (See Comments)   Caused confusion per husband      Medication List       Accurate as of July 07, 2019  4:22 PM. If you have any questions, ask your nurse or doctor.        acetaminophen 500 MG tablet Commonly known as: TYLENOL Take 1,000 mg by mouth every 6 (six) hours as needed for headache (pain).   aspirin EC 81 MG tablet Take 81 mg by mouth every evening.   carbamazepine 100 MG chewable tablet Commonly known as: TEGRETOL Chew 100 mg by mouth 3 (three) times daily.   carvedilol 3.125 MG tablet Commonly known as: COREG Take 1 tablet (3.125 mg total) by mouth 2 (two) times daily with a meal.   Ferrous Sulfate 27 MG Tabs Take 27 mg by mouth  every evening.   folic acid 1 MG tablet Commonly known as: FOLVITE Take 1 tablet (1 mg total) by mouth daily.   furosemide 80 MG tablet Commonly known as: LASIX Take 1 tablet (80 mg total) by mouth daily.   gabapentin 800 MG tablet Commonly known as: NEURONTIN Take 0.5 tablets (400 mg total) by mouth 3 (three) times daily. For pain What changed: how much to take   levothyroxine 112 MCG tablet Commonly known as: SYNTHROID Take 1 tablet (112 mcg total) by mouth daily.   mirtazapine 45 MG tablet Commonly known as: REMERON Take 45 mg by mouth at bedtime.   nitroGLYCERIN 0.4 MG SL tablet Commonly known as: NITROSTAT Place 1 tablet  (0.4 mg total) under the tongue every 5 (five) minutes as needed for chest pain. Please make yearly appt with [name] for June. 1st attempt   OVER THE COUNTER MEDICATION Apply 1 application topically daily as needed (knee pain). Veterinary liniment   Oxycodone HCl 10 MG Tabs Take 1 tablet (10 mg total) by mouth 2 (two) times daily as needed (pain).   potassium chloride SA 20 MEQ tablet Commonly known as: KLOR-CON Take 1 tablet (20 mEq total) by mouth daily.   ProAir HFA 108 (90 Base) MCG/ACT inhaler Generic drug: albuterol Inhale 2 puffs into the lungs every 6 (six) hours as needed for wheezing or shortness of breath.   promethazine 25 MG tablet Commonly known as: PHENERGAN Take 25 mg by mouth every 6 (six) hours as needed for nausea or vomiting.   ramipril 2.5 MG capsule Commonly known as: ALTACE Take 1 capsule (2.5 mg total) by mouth daily.   simvastatin 10 MG tablet Commonly known as: ZOCOR Take 1 tablet by mouth once daily   spironolactone 25 MG tablet Commonly known as: ALDACTONE Take 0.5 tablets (12.5 mg total) by mouth daily.   venlafaxine XR 150 MG 24 hr capsule Commonly known as: EFFEXOR-XR Take 300 mg by mouth daily with breakfast.           Past Medical History:  Diagnosis Date  . Anemia   . CAD (coronary artery disease)    Prior LAD stenting with stent thrombosis occurring in a withdrawal phase of her Plavix undertaken because of trigeminal neuralgia and release surgery. It was treated medically.  . Cancer ([place])    small bladder cancer removed no  treatment  . Cataract   . CHF (congestive heart failure) ([place])   . Complication of anesthesia    massive heart attack  . GERD (gastroesophageal reflux disease)   . Hyperlipidemia   . Ischemic cardiomyopathy   . Osteoarthritis   . SOB (shortness of breath)     Past Surgical History:  Procedure Laterality Date  . APPENDECTOMY    . BACK SURGERY     x2 back surgeries  . BI-VENTRICULAR IMPLANTABLE  CARDIOVERTER DEFIBRILLATOR  (CRT-D)  2009; 05/2013   STJ CRTD generator change by Dr Caryl Comes 05/2013  . BIV PACEMAKER GENERATOR CHANGE OUT Bilateral 05/11/2013   Procedure: BIV PACEMAKER GENERATOR CHANGE OUT;  Surgeon: Deboraha Sprang, MD;  Location[facility] CATH LAB;  Service: Cardiovascular;  Laterality: Bilateral;  . BRAIN SURGERY     x3  . EYE SURGERY Right 05/24/12  . JOINT REPLACEMENT     thumbs and knees replacement  . TUBAL LIGATION      Family History  Problem Relation Age of Onset  . Heart disease Father     Social History:  reports that she has  been smoking cigarettes. She has a 15.00 pack-year smoking history. She has never used smokeless tobacco. She reports that she does not drink alcohol or use drugs.  Allergies:  Allergies  Allergen Reactions  . Clorazepate Dipotassium Other (See Comments)    Caused confusion per husband    Review of Systems:  CARDIOLOGY: she has history of coronary artery disease with multiple events and interventions    She is on low-dose carvedilol, Aldactone and Lasix for CHF         Examination:   LMP  (LMP Unknown)        Assessment   Post ablative hypothyroidism:   She had required variable doses of supplementation for her thyroid since 2019  Her TSH is persistently low Difficult to judge whether she is symptomatic or not, free T4 is not high No unusual episodes of tachycardia Also no significant weight change   Now with taking 112 mcg levothyroxine 6-1/2 tablets a week her TSH is on the 0.08 and lower than before This is likely to be from her better compliance with taking her medication regularly compared to last year She does takes her medication on empty stomach consistently Discussed that she is currently getting excessive thyroid supplementation and will need to keep adjusting it to have her TSH mid normal  Need to reduce her dosage further and she will go down to 88 mcg  Follow-up in 2 months  Duration of telephone  encounter =5 minutes  Elayne Snare 07/07/2019, 4:22 PM

## 2019-07-04 NOTE — Telephone Encounter (Signed)
Pt's medication was sent to pt's pharmacy as requested. Confirmation received.  °

## 2019-07-05 LAB — T4, FREE: Free T4: 0.99 ng/dL (ref 0.60–1.60)

## 2019-07-05 LAB — TSH: TSH: 0.08 u[IU]/mL — ABNORMAL LOW (ref 0.35–4.50)

## 2019-07-07 ENCOUNTER — Ambulatory Visit (INDEPENDENT_AMBULATORY_CARE_PROVIDER_SITE_OTHER): Payer: Medicare Other | Admitting: Endocrinology

## 2019-07-07 DIAGNOSIS — Z0289 Encounter for other administrative examinations: Secondary | ICD-10-CM

## 2019-07-07 DIAGNOSIS — E890 Postprocedural hypothyroidism: Secondary | ICD-10-CM | POA: Diagnosis not present

## 2019-07-07 MED ORDER — LEVOTHYROXINE SODIUM 88 MCG PO TABS: 88.0000 ug | ORAL_TABLET | Freq: Every day | ORAL | 3 refills | Status: AC

## 2019-07-08 ENCOUNTER — Encounter: Payer: Self-pay | Admitting: Endocrinology

## 2019-07-14 ENCOUNTER — Other Ambulatory Visit: Payer: Self-pay

## 2019-07-14 MED ORDER — FUROSEMIDE 80 MG PO TABS: 80.0000 mg | ORAL_TABLET | Freq: Every day | ORAL | 7 refills | Status: AC

## 2019-07-14 NOTE — Telephone Encounter (Signed)
Pt's medication was sent to pt's pharmacy as requested. Confirmation received.  °

## 2019-07-18 NOTE — Progress Notes (Signed)
No ICM remote transmission received for 07/11/2019 and next ICM transmission scheduled for 07/20/2019.

## 2019-07-21 ENCOUNTER — Other Ambulatory Visit: Payer: Medicare Other

## 2019-07-26 ENCOUNTER — Other Ambulatory Visit: Payer: Self-pay

## 2019-07-26 ENCOUNTER — Encounter: Payer: Medicare Other | Admitting: Endocrinology

## 2019-07-26 NOTE — Progress Notes (Signed)
This encounter was created in error - please disregard.

## 2019-07-27 DIAGNOSIS — R202 Paresthesia of skin: Secondary | ICD-10-CM | POA: Diagnosis not present

## 2019-07-27 DIAGNOSIS — G44091 Other trigeminal autonomic cephalgias (TAC), intractable: Secondary | ICD-10-CM | POA: Diagnosis not present

## 2019-07-28 ENCOUNTER — Other Ambulatory Visit: Payer: Medicare Other

## 2019-07-29 ENCOUNTER — Other Ambulatory Visit: Payer: Self-pay

## 2019-07-29 ENCOUNTER — Ambulatory Visit: Payer: Medicare Other | Admitting: Cardiology

## 2019-07-29 ENCOUNTER — Encounter: Payer: Self-pay | Admitting: Cardiology

## 2019-07-29 VITALS — BP 94/60 | HR 70 | Ht 63.0 in | Wt 150.6 lb

## 2019-07-29 DIAGNOSIS — Z720 Tobacco use: Secondary | ICD-10-CM | POA: Diagnosis not present

## 2019-07-29 DIAGNOSIS — I255 Ischemic cardiomyopathy: Secondary | ICD-10-CM | POA: Diagnosis not present

## 2019-07-29 DIAGNOSIS — I25119 Atherosclerotic heart disease of native coronary artery with unspecified angina pectoris: Secondary | ICD-10-CM

## 2019-07-29 DIAGNOSIS — I5022 Chronic systolic (congestive) heart failure: Secondary | ICD-10-CM

## 2019-07-29 DIAGNOSIS — G500 Trigeminal neuralgia: Secondary | ICD-10-CM

## 2019-07-29 NOTE — Progress Notes (Signed)
Cardiology Office Note:    Date:  07/29/2019   ID:  [name], DOB 04-26-1945, MRN [id number]  PCP:  Shirline Frees, MD  Cardiologist:  Candee Furbish, MD  Electrophysiologist:  None   Referring MD: Shirline Frees, MD     History of Present Illness:    [name] is a 74 y.o. female with ischemic cardiomyopathy ICD implantation [place] 2009 here for follow-up.  Continues to smoke.  Chronic dyspnea.  EF 20 to 25% range  She was hospitalized in April 2020 with acute hypoxic respiratory failure.  Diuresed with Lasix.  EF remain 20%.  Overall no significant changes.  No significant orthopnea.  Mild shortness of breath.  No chest pain.  Compliant with her medications.  Reviewed recent note from Dr. Caryl Comes.  Past Medical History:  Diagnosis Date  . Anemia   . CAD (coronary artery disease)    Prior LAD stenting with stent thrombosis occurring in a withdrawal phase of her Plavix undertaken because of trigeminal neuralgia and release surgery. It was treated medically.  . Cancer ([place])    small bladder cancer removed no  treatment  . Cataract   . CHF (congestive heart failure) ([place])   . Complication of anesthesia    massive heart attack  . GERD (gastroesophageal reflux disease)   . Hyperlipidemia   . Ischemic cardiomyopathy   . Osteoarthritis   . SOB (shortness of breath)     Past Surgical History:  Procedure Laterality Date  . APPENDECTOMY    . BACK SURGERY     x2 back surgeries  . BI-VENTRICULAR IMPLANTABLE CARDIOVERTER DEFIBRILLATOR  (CRT-D)  2009; 05/2013   STJ CRTD generator change by Dr Caryl Comes 05/2013  . BIV PACEMAKER GENERATOR CHANGE OUT Bilateral 05/11/2013   Procedure: BIV PACEMAKER GENERATOR CHANGE OUT;  Surgeon: Deboraha Sprang, MD;  Location: [facility] CATH LAB;  Service: Cardiovascular;  Laterality: Bilateral;  . BRAIN SURGERY     x3  . EYE SURGERY Right 05/24/12  . JOINT REPLACEMENT     thumbs and knees replacement  . TUBAL LIGATION      Current  Medications: Current Meds  Medication Sig  . acetaminophen (TYLENOL) 500 MG tablet Take 1,000 mg by mouth every 6 (six) hours as needed for headache (pain).  Marland Kitchen albuterol (PROAIR HFA) 108 (90 Base) MCG/ACT inhaler Inhale 2 puffs into the lungs every 6 (six) hours as needed for wheezing or shortness of breath.  Marland Kitchen aspirin EC 81 MG tablet Take 81 mg by mouth every evening.   . carbamazepine (TEGRETOL) 100 MG chewable tablet Chew 100 mg by mouth 3 (three) times daily.   . carvedilol (COREG) 3.125 MG tablet Take 1 tablet (3.125 mg total) by mouth 2 (two) times daily with a meal.  . Ferrous Sulfate 27 MG TABS Take 27 mg by mouth every evening.  . folic acid (FOLVITE) 1 MG tablet Take 1 tablet (1 mg total) by mouth daily.  . furosemide (LASIX) 80 MG tablet Take 1 tablet (80 mg total) by mouth daily.  Marland Kitchen gabapentin (NEURONTIN) 800 MG tablet Take 0.5 tablets (400 mg total) by mouth 3 (three) times daily. For pain (Patient taking differently: Take 800 mg by mouth 3 (three) times daily. For pain)  . levothyroxine (SYNTHROID) 88 MCG tablet Take 1 tablet (88 mcg total) by mouth daily.  . mirtazapine (REMERON) 45 MG tablet Take 45 mg by mouth at bedtime.  . nitroGLYCERIN (NITROSTAT) 0.4 MG SL tablet Place 1 tablet (0.4 mg  total) under the tongue every 5 (five) minutes as needed for chest pain. Please make yearly appt with [name] for June. 1st attempt  . OVER THE COUNTER MEDICATION Apply 1 application topically daily as needed (knee pain). Veterinary liniment  . Oxycodone HCl 10 MG TABS Take 1 tablet (10 mg total) by mouth 2 (two) times daily as needed (pain).  . potassium chloride SA (K-DUR) 20 MEQ tablet Take 1 tablet (20 mEq total) by mouth daily.  . promethazine (PHENERGAN) 25 MG tablet Take 25 mg by mouth every 6 (six) hours as needed for nausea or vomiting.   . ramipril (ALTACE) 2.5 MG capsule Take 1 capsule (2.5 mg total) by mouth daily.  . simvastatin (ZOCOR) 10 MG tablet Take 1 tablet by mouth once  daily  . spironolactone (ALDACTONE) 25 MG tablet Take 0.5 tablets (12.5 mg total) by mouth daily.  Marland Kitchen venlafaxine XR (EFFEXOR-XR) 150 MG 24 hr capsule Take 300 mg by mouth daily with breakfast.    Current Facility-Administered Medications for the 07/29/19 encounter (Office Visit) with Jerline Pain, MD  Medication  . betamethasone acetate-betamethasone sodium phosphate (CELESTONE) injection 3 mg     Allergies:   Clorazepate dipotassium   Social History   Socioeconomic History  . Marital status: Legally Separated    Spouse name: Not on file  . Number of children: Not on file  . Years of education: Not on file  . Highest education level: Not on file  Occupational History  . Not on file  Tobacco Use  . Smoking status: Current Some Day Smoker    Packs/day: 0.50    Years: 30.00    Pack years: 15.00    Types: Cigarettes  . Smokeless tobacco: Never Used  Substance and Sexual Activity  . Alcohol use: No  . Drug use: No  . Sexual activity: Never  Other Topics Concern  . Not on file  Social History Narrative  . Not on file   Social Determinants of Health   Financial Resource Strain:   . Difficulty of Paying Living Expenses:   Food Insecurity:   . Worried About Charity fundraiser in the Last Year:   . Arboriculturist in the Last Year:   Transportation Needs:   . Film/video editor (Medical):   Marland Kitchen Lack of Transportation (Non-Medical):   Physical Activity:   . Days of Exercise per Week:   . Minutes of Exercise per Session:   Stress:   . Feeling of Stress :   Social Connections:   . Frequency of Communication with Friends and Family:   . Frequency of Social Gatherings with Friends and Family:   . Attends Religious Services:   . Active Member of Clubs or Organizations:   . Attends Archivist Meetings:   Marland Kitchen Marital Status:      Family History: The patient's family history includes Heart disease in her father.  ROS:   Please see the history of present  illness.     All other systems reviewed and are negative.  EKGs/Labs/Other Studies Reviewed:    Recent Labs: 07/04/2019: TSH 0.08  Recent Lipid Panel No results found for: CHOL, TRIG, HDL, CHOLHDL, VLDL, LDLCALC, LDLDIRECT  Physical Exam:    VS:  BP 94/60   Pulse 70   Ht 5\' 3"  (1.6 m)   Wt 150 lb 9.6 oz (68.3 kg)   LMP  (LMP Unknown)   SpO2 95%   BMI 26.68 kg/m  Wt Readings from Last 3 Encounters:  07/29/19 150 lb 9.6 oz (68.3 kg)  02/14/19 150 lb (68 kg)  09/17/18 150 lb (68 kg)     GEN: Thin  in no acute distress HEENT: Right eye ptosis NECK: No JVD; No carotid bruits LYMPHATICS: No lymphadenopathy CARDIAC: RRR, no murmurs, rubs, gallops RESPIRATORY:  Clear to auscultation without rales, wheezing or rhonchi, somewhat distant breath sounds ABDOMEN: Soft, non-tender, non-distended MUSCULOSKELETAL:  No edema; No deformity  SKIN: Warm and dry NEUROLOGIC:  Alert and oriented x 3 PSYCHIATRIC:  Normal affect   ASSESSMENT:    1. Chronic systolic heart failure ([place])   2. Ischemic cardiomyopathy   3. Coronary artery disease involving native coronary artery of native heart with angina pectoris ([place])   4. Tobacco use   5. Trigeminal neuralgia    PLAN:    In order of problems listed above:  Chronic systolic heart failure -Ischemic cardiomyopathy -EF 20% really no change over the last several years. -Continue with current medications.  Biventricular ICD -Dr. Caryl Comes has been following.  I reviewed his last note.  She has an MRI compatible device.  Tobacco use -Not interested in quitting at this time.  Discussed again today.  Trigeminal neuralgia -Watch improved on gabapentin.  Chronic hypotension -No room to increase medications.   Medication Adjustments/Labs and Tests Ordered: Current medicines are reviewed at length with the patient today.  Concerns regarding medicines are outlined above.  No orders of the defined types were placed in this encounter.  No  orders of the defined types were placed in this encounter.   Patient Instructions  Medication Instructions:  The current medical regimen is effective;  continue present plan and medications.  *If you need a refill on your cardiac medications before your next appointment, please call your pharmacy*  Follow-Up: At [facility], you and your health needs are our priority.  As part of our continuing mission to provide you with exceptional heart care, we have created designated Provider Care Teams.  These Care Teams include your primary Cardiologist (physician) and Advanced Practice Providers (APPs -  Physician Assistants and Nurse Practitioners) who all work together to provide you with the care you need, when you need it.  We recommend signing up for the patient portal called "MyChart".  Sign up information is provided on this After Visit Summary.  MyChart is used to connect with patients for Virtual Visits (Telemedicine).  Patients are able to view lab/test results, encounter notes, upcoming appointments, etc.  Non-urgent messages can be sent to your provider as well.   To learn more about what you can do with MyChart, go to [email or web address].    Your next appointment:   6 month(s)  The format for your next appointment:   In Person  Provider:   Candee Furbish, MD  Thank you for choosing [facility]!!        Signed, Candee Furbish, MD  07/29/2019 5:14 PM    [place]

## 2019-07-29 NOTE — Patient Instructions (Signed)

## 2019-08-01 ENCOUNTER — Other Ambulatory Visit: Payer: Medicare Other

## 2019-08-03 ENCOUNTER — Telehealth: Payer: Self-pay | Admitting: Endocrinology

## 2019-08-03 ENCOUNTER — Other Ambulatory Visit: Payer: Self-pay

## 2019-08-03 ENCOUNTER — Encounter: Payer: Medicare Other | Admitting: Endocrinology

## 2019-08-03 NOTE — Telephone Encounter (Signed)
Call needs to be cancelled per Dr Dwyane Dee and and a lab/follow up scheduled for June 2021. I just called her now, no answer, left VM for her to call back and schedule for June 2021

## 2019-08-03 NOTE — Telephone Encounter (Signed)
-----   Message from Elayne Snare, MD sent at 08/03/2019  1:37 PM EDT ----- Regarding: Appointment today She did not need to see me today.  She is supposed to see me in about a month but needs to have labs done before that, please cancel

## 2019-08-03 NOTE — Progress Notes (Signed)
This encounter was created in error - please disregard.

## 2019-08-12 NOTE — Progress Notes (Signed)
Unable to reach patient for monthly ICM follow up.  No remote transmissions received since 04/26/2019 and monitor is disconnected.  Patient disenrolled in [facility] clinic and device clinic will continue remote monitoring every 91 days.

## 2019-08-22 DIAGNOSIS — J449 Chronic obstructive pulmonary disease, unspecified: Secondary | ICD-10-CM | POA: Diagnosis not present

## 2019-08-22 DIAGNOSIS — I2510 Atherosclerotic heart disease of native coronary artery without angina pectoris: Secondary | ICD-10-CM | POA: Diagnosis not present

## 2019-08-22 DIAGNOSIS — J441 Chronic obstructive pulmonary disease with (acute) exacerbation: Secondary | ICD-10-CM | POA: Diagnosis not present

## 2019-08-22 DIAGNOSIS — F329 Major depressive disorder, single episode, unspecified: Secondary | ICD-10-CM | POA: Diagnosis not present

## 2019-09-02 ENCOUNTER — Other Ambulatory Visit: Payer: Self-pay

## 2019-09-02 ENCOUNTER — Other Ambulatory Visit: Payer: Self-pay | Admitting: Cardiology

## 2019-09-02 MED ORDER — RAMIPRIL 2.5 MG PO CAPS: 2.5000 mg | ORAL_CAPSULE | Freq: Every day | ORAL | 2 refills | Status: AC

## 2019-09-02 NOTE — Telephone Encounter (Signed)
   *  STAT* If patient is at the pharmacy, call can be transferred to refill team.   1. Which medications need to be refilled? (please list name of each medication and dose if known)   ramipril (ALTACE) 2.5 MG capsule    2. Which pharmacy/location (including street and city if local pharmacy) is medication to be sent to? [facility] ([place] [place] [address]  3. Do they need a 30 day or 90 day supply? 90 days

## 2019-09-05 ENCOUNTER — Other Ambulatory Visit (INDEPENDENT_AMBULATORY_CARE_PROVIDER_SITE_OTHER): Payer: Medicare Other

## 2019-09-05 ENCOUNTER — Other Ambulatory Visit: Payer: Self-pay

## 2019-09-05 DIAGNOSIS — E890 Postprocedural hypothyroidism: Secondary | ICD-10-CM

## 2019-09-05 LAB — T4, FREE: Free T4: 0.65 ng/dL (ref 0.60–1.60)

## 2019-09-05 LAB — TSH: TSH: 9.37 u[IU]/mL — ABNORMAL HIGH (ref 0.35–4.50)

## 2019-09-08 ENCOUNTER — Other Ambulatory Visit: Payer: Self-pay

## 2019-09-08 ENCOUNTER — Encounter: Payer: Medicare Other | Admitting: Endocrinology

## 2019-09-08 NOTE — Progress Notes (Signed)
This encounter was created in error - please disregard.

## 2019-09-12 ENCOUNTER — Telehealth: Payer: Self-pay | Admitting: Endocrinology

## 2019-09-12 NOTE — Telephone Encounter (Signed)
-----   Message from Oval Linsey sent at 09/09/2019 10:56 AM EDT ----- Regarding: RE: Missed appointment ATC patient to reschedule - "call cannot be completed at this time" - checked her DPR to try and contact anyone else to reach her and there is no one else on there (husband's phone is same as hers) ----- Message ----- From: Elayne Snare, MD Sent: 09/08/2019   9:13 PM EDT To: [facility] Subject: Missed appointment                             She needs to be rescheduled, she did not answer her phone for her appointment today.  Would prefer that she come into the office because of unreliability of her picking up her phone calls

## 2019-09-12 NOTE — Telephone Encounter (Signed)
LMTCB

## 2019-09-16 ENCOUNTER — Other Ambulatory Visit: Payer: Self-pay

## 2019-09-16 ENCOUNTER — Telehealth (INDEPENDENT_AMBULATORY_CARE_PROVIDER_SITE_OTHER): Payer: Medicare Other | Admitting: Endocrinology

## 2019-09-16 ENCOUNTER — Encounter: Payer: Self-pay | Admitting: Endocrinology

## 2019-09-16 DIAGNOSIS — E890 Postprocedural hypothyroidism: Secondary | ICD-10-CM | POA: Diagnosis not present

## 2019-09-16 NOTE — Progress Notes (Signed)
Patient ID: [name], female   DOB: 10-10-1945, 74 y.o.   MRN: [id number]    Today's office visit was provided via telemedicine using audio technique Explained to the patient and the the limitations of evaluation and management by telemedicine and the availability of in person appointments.  The patient understood the limitations and agreed to proceed. Patient also understood that the telehealth visit is billable. . Location of the patient: Home . Location of the provider: Office Only the patient and myself were participating in the encounter    Reason for Appointment:  Post ablative hypothyroidism, followup + History of Present Illness:    She became hypothyroid in 03/2012 following radioactive iodine treatment for hyperthyroidism in 11/13  She was initially started with 75 mcg of levothyroxine but with her TSH being nearly 50 in 3/14 the dose was increased to 137 mcg Subsequently has required lower doses   She has usually no change in her physical symptoms with variable thyroid levels or dosage changes However some of this may be because of her difficulty with remembering symptoms  She had not followed up for over a year when she was seen in 8/19 and her TSH was suppressed at 0.03 with relatively higher free T4 At that time she had no new complaints such as palpitations, jitteriness or shakiness Her dosage at that time was 112 mcg  RECENT HISTORY:  In 2019 her dose was changed to 100 mcg levothyroxine However because of her TSH being 15 in 12/2018 she was then given 112 mcg  Subsequently appears to be requiring lower doses Over the last 2 visits this year her dose has been gradually decreased However even with only a slight reduction in her dose in 2/21 her TSH was lower at 0.08  Instead of 6-1/2 tablets a week of the 112 mcg she is now taking 88 mcg Although previously she thought she was taking her levothyroxine more regularly with her husband's help she  now admits that she is tending to forget the medication in the mornings She is not clear how often she is forgetting it She does not take any iron in the morning  Not clear if her weight has changed  She has been feeling more tired Also in general does not think she feels as well  TSH is now 9.4  Wt Readings from Last 3 Encounters:  07/29/19 150 lb 9.6 oz (68.3 kg)  02/14/19 150 lb (68 kg)  09/17/18 150 lb (68 kg)   Labs:  Lab Results  Component Value Date   TSH 9.37 (H) 09/05/2019   TSH 0.08 (L) 07/04/2019   TSH 0.13 (L) 04/27/2019   FREET4 0.65 09/05/2019   FREET4 0.99 07/04/2019   FREET4 0.78 04/27/2019       Allergies as of 09/16/2019      Reactions   Clorazepate Dipotassium Other (See Comments)   Caused confusion per husband      Medication List       Accurate as of September 16, 2019  9:53 AM. If you have any questions, ask your nurse or doctor.        acetaminophen 500 MG tablet Commonly known as: TYLENOL Take 1,000 mg by mouth every 6 (six) hours as needed for headache (pain).   aspirin EC 81 MG tablet Take 81 mg by mouth every evening.   carbamazepine 100 MG chewable tablet Commonly known as: TEGRETOL Chew 100 mg by mouth 3 (three) times daily.   carvedilol 3.125 MG  tablet Commonly known as: COREG Take 1 tablet (3.125 mg total) by mouth 2 (two) times daily with a meal.   Ferrous Sulfate 27 MG Tabs Take 27 mg by mouth every evening.   folic acid 1 MG tablet Commonly known as: FOLVITE Take 1 tablet (1 mg total) by mouth daily.   furosemide 80 MG tablet Commonly known as: LASIX Take 1 tablet (80 mg total) by mouth daily.   gabapentin 800 MG tablet Commonly known as: NEURONTIN Take 0.5 tablets (400 mg total) by mouth 3 (three) times daily. For pain What changed: how much to take   levothyroxine 88 MCG tablet Commonly known as: SYNTHROID Take 1 tablet (88 mcg total) by mouth daily.   mirtazapine 45 MG tablet Commonly known as:  REMERON Take 45 mg by mouth at bedtime.   nitroGLYCERIN 0.4 MG SL tablet Commonly known as: NITROSTAT Place 1 tablet (0.4 mg total) under the tongue every 5 (five) minutes as needed for chest pain. Please make yearly appt with [name] for June. 1st attempt   OVER THE COUNTER MEDICATION Apply 1 application topically daily as needed (knee pain). Veterinary liniment   Oxycodone HCl 10 MG Tabs Take 1 tablet (10 mg total) by mouth 2 (two) times daily as needed (pain).   potassium chloride SA 20 MEQ tablet Commonly known as: KLOR-CON Take 1 tablet (20 mEq total) by mouth daily.   ProAir HFA 108 (90 Base) MCG/ACT inhaler Generic drug: albuterol Inhale 2 puffs into the lungs every 6 (six) hours as needed for wheezing or shortness of breath.   promethazine 25 MG tablet Commonly known as: PHENERGAN Take 25 mg by mouth every 6 (six) hours as needed for nausea or vomiting.   ramipril 2.5 MG capsule Commonly known as: ALTACE Take 1 capsule (2.5 mg total) by mouth daily.   simvastatin 10 MG tablet Commonly known as: ZOCOR Take 1 tablet by mouth once daily   spironolactone 25 MG tablet Commonly known as: ALDACTONE Take 0.5 tablets (12.5 mg total) by mouth daily.   venlafaxine XR 150 MG 24 hr capsule Commonly known as: EFFEXOR-XR Take 300 mg by mouth daily with breakfast.           Past Medical History:  Diagnosis Date  . Anemia   . CAD (coronary artery disease)    Prior LAD stenting with stent thrombosis occurring in a withdrawal phase of her Plavix undertaken because of trigeminal neuralgia and release surgery. It was treated medically.  . Cancer ([place])    small bladder cancer removed no  treatment  . Cataract   . CHF (congestive heart failure) ([place])   . Complication of anesthesia    massive heart attack  . GERD (gastroesophageal reflux disease)   . Hyperlipidemia   . Ischemic cardiomyopathy   . Osteoarthritis   . SOB (shortness of breath)     Past Surgical History:   Procedure Laterality Date  . APPENDECTOMY    . BACK SURGERY     x2 back surgeries  . BI-VENTRICULAR IMPLANTABLE CARDIOVERTER DEFIBRILLATOR  (CRT-D)  2009; 05/2013   STJ CRTD generator change by Dr Caryl Comes 05/2013  . BIV PACEMAKER GENERATOR CHANGE OUT Bilateral 05/11/2013   Procedure: BIV PACEMAKER GENERATOR CHANGE OUT;  Surgeon: Deboraha Sprang, MD;  Location: [facility] CATH LAB;  Service: Cardiovascular;  Laterality: Bilateral;  . BRAIN SURGERY     x3  . EYE SURGERY Right 05/24/12  . JOINT REPLACEMENT     thumbs and knees replacement  .  TUBAL LIGATION      Family History  Problem Relation Age of Onset  . Heart disease Father     Social History:  reports that she has been smoking cigarettes. She has a 15.00 pack-year smoking history. She has never used smokeless tobacco. She reports that she does not drink alcohol and does not use drugs.  Allergies:  Allergies  Allergen Reactions  . Clorazepate Dipotassium Other (See Comments)    Caused confusion per husband    Review of Systems:  CARDIOLOGY: she has history of coronary artery disease   She is on low-dose carvedilol, Aldactone and Lasix for CHF  Her medications also include Remeron and Neurontin       Examination:   LMP  (LMP Unknown)        Assessment   Post ablative hypothyroidism:   She had required variable doses of supplementation for her hypothyroidism since 2019  Her TSH which was previously persistently low is now 9.4 with reduction in her levothyroxine dose Appears that she is now not taking her levothyroxine as regularly as before even though she keeps her medication bottle on her kitchen table  She may be symptomatic as she does not feel as well and has no fatigue although she continues to have multiple other problems  Currently unclear if she needs to increase her levothyroxine but since she is going to try and improve her compliance will continue the same dose of 88 mcg She will ask her husband to remind her  to take her thyroid supplement in the morning before breakfast and not combine with any vitamins or iron  Follow-up in 2 months  Duration of telephone encounter =5 minutes  Elayne Snare 09/16/2019, 9:53 AM

## 2019-10-15 ENCOUNTER — Other Ambulatory Visit: Payer: Self-pay | Admitting: Cardiology

## 2019-10-31 ENCOUNTER — Other Ambulatory Visit: Payer: Medicare Other

## 2019-11-03 ENCOUNTER — Ambulatory Visit: Payer: Medicare Other | Admitting: Endocrinology

## 2019-12-11 ENCOUNTER — Other Ambulatory Visit: Payer: Self-pay | Admitting: Cardiology

## 2019-12-16 ENCOUNTER — Encounter (HOSPITAL_COMMUNITY): Payer: Self-pay | Admitting: Emergency Medicine

## 2019-12-16 ENCOUNTER — Other Ambulatory Visit: Payer: Self-pay

## 2019-12-16 ENCOUNTER — Emergency Department (HOSPITAL_COMMUNITY): Payer: Medicare Other

## 2019-12-16 ENCOUNTER — Emergency Department (HOSPITAL_COMMUNITY)
Admission: EM | Admit: 2019-12-16 | Discharge: 2019-12-16 | Disposition: A | Discharge status: Home / Self Care | Payer: Medicare Other | Attending: Emergency Medicine | Admitting: Emergency Medicine

## 2019-12-16 DIAGNOSIS — Z5321 Procedure and treatment not carried out due to patient leaving prior to being seen by health care provider: Secondary | ICD-10-CM | POA: Diagnosis not present

## 2019-12-16 DIAGNOSIS — R2243 Localized swelling, mass and lump, lower limb, bilateral: Secondary | ICD-10-CM | POA: Diagnosis not present

## 2019-12-16 DIAGNOSIS — R0602 Shortness of breath: Secondary | ICD-10-CM | POA: Diagnosis not present

## 2019-12-16 DIAGNOSIS — R0902 Hypoxemia: Secondary | ICD-10-CM | POA: Diagnosis not present

## 2019-12-16 DIAGNOSIS — I959 Hypotension, unspecified: Secondary | ICD-10-CM | POA: Diagnosis not present

## 2019-12-16 DIAGNOSIS — I517 Cardiomegaly: Secondary | ICD-10-CM | POA: Diagnosis not present

## 2019-12-16 DIAGNOSIS — R609 Edema, unspecified: Secondary | ICD-10-CM | POA: Diagnosis not present

## 2019-12-16 LAB — COMPREHENSIVE METABOLIC PANEL
ALT: 28 U/L (ref 0–44)
AST: 36 U/L (ref 15–41)
Albumin: 3.5 g/dL (ref 3.5–5.0)
Alkaline Phosphatase: 76 U/L (ref 38–126)
Anion gap: 11 (ref 5–15)
BUN: 11 mg/dL (ref 8–23)
CO2: 26 mmol/L (ref 22–32)
Calcium: 9.4 mg/dL (ref 8.9–10.3)
Chloride: 101 mmol/L (ref 98–111)
Creatinine, Ser: 0.96 mg/dL (ref 0.44–1.00)
GFR calc Af Amer: >60 mL/min (ref >60)
GFR calc non Af Amer: 58 mL/min — ABNORMAL LOW (ref >60)
Glucose, Bld: 86 mg/dL (ref 70–99)
Potassium: 3 mmol/L — ABNORMAL LOW (ref 3.5–5.1)
Sodium: 138 mmol/L (ref 135–145)
Total Bilirubin: 0.9 mg/dL (ref 0.3–1.2)
Total Protein: 7 g/dL (ref 6.5–8.1)

## 2019-12-16 LAB — CBC WITH DIFFERENTIAL/PLATELET
Abs Immature Granulocytes: 0.03 10*3/uL (ref 0.00–0.07)
Basophils Absolute: 0.1 10*3/uL (ref 0.0–0.1)
Basophils Relative: 1 %
Eosinophils Absolute: 0 10*3/uL (ref 0.0–0.5)
Eosinophils Relative: 1 %
HCT: 45 % (ref 36.0–46.0)
Hemoglobin: 12.9 g/dL (ref 12.0–15.0)
Immature Granulocytes: 0 %
Lymphocytes Relative: 16 %
Lymphs Abs: 1.4 10*3/uL (ref 0.7–4.0)
MCH: 31.2 pg (ref 26.0–34.0)
MCHC: 28.7 g/dL — ABNORMAL LOW (ref 30.0–36.0)
MCV: 108.7 fL — ABNORMAL HIGH (ref 80.0–100.0)
Monocytes Absolute: 0.8 10*3/uL (ref 0.1–1.0)
Monocytes Relative: 10 %
Neutro Abs: 6.2 10*3/uL (ref 1.7–7.7)
Neutrophils Relative %: 72 %
Platelets: 229 10*3/uL (ref 150–400)
RBC: 4.14 MIL/uL (ref 3.87–5.11)
RDW: 16.9 % — ABNORMAL HIGH (ref 11.5–15.5)
WBC: 8.5 10*3/uL (ref 4.0–10.5)
nRBC: 0 % (ref 0.0–0.2)

## 2019-12-16 NOTE — ED Triage Notes (Signed)
Patient here for increased bilateral lower extremity edema. Denies chest pain or shortness of breath.

## 2019-12-16 NOTE — ED Notes (Signed)
outside

## 2019-12-16 NOTE — ED Notes (Signed)
Pt requested a taxi be called so that she could leave AMA.  Pt also asked to be rolled off the premises so that she could smoke.  Pt was advised to wait .

## 2019-12-16 NOTE — ED Triage Notes (Signed)
Emergency Medicine Provider Triage Evaluation Note  [name] , a 74 y.o. female  was evaluated in triage.  Pt complains of bilateral foot swelling.  This is been going for an unknown amount of time, patient states may be several days.  Also the possible days, she has not been taking her fluid pill, cannot give a reason why.  She denies fevers, chills, chest pain, shortness of breath, cough.  Review of Systems  Positive: Leg swelling Negative: Chest pain, shortness of breath, fever  Physical Exam  BP (!) 100/58   Pulse 70   Temp 99.5 F (37.5 C) (Oral)   Resp 18   Ht 5\' 3"  (1.6 m)   Wt 63.5 kg   LMP  (LMP Unknown)   SpO2 96%   BMI 24.80 kg/m  Gen:   Awake, no distress. Appears chronically ill HEENT:  Atraumatic  Resp:  Normal effort  Cardiac:  Normal rate  Abd:   Nondistended, nontender  MSK:   Moves extremities without difficulty.  Pitting edema of bilateral lower extremities Neuro:  Speech clear   Medical Decision Making  Medically screening exam initiated at 4:27 PM.  Appropriate orders placed.  CERINITY ZYNDA was informed that the remainder of the evaluation will be completed by another provider, this initial triage assessment does not replace that evaluation, and the importance of remaining in the ED until their evaluation is complete.  Clinical Impression   Patient with pitting edema bilateral lower extremities.  She is not been taking her fluid pill, likely acute on chronic heart failure.  Patient not reporting any chest pain or shortness of breath, however does appear chronically ill, and overall appears to be a poor historian.  Will obtain chest x-ray to ensure no fluid overload.    Franchot Heidelberg, PA-C 12/16/19 [id number]

## 2019-12-16 NOTE — ED Notes (Signed)
No answer for VS x1 

## 2019-12-16 NOTE — ED Notes (Signed)
Pt no longer found at chair she was waiting in other in the waiting room stated that she had walked outside to smoke.

## 2019-12-16 NOTE — ED Notes (Signed)
Provider screened patient in triage.

## 2019-12-27 ENCOUNTER — Other Ambulatory Visit: Payer: Self-pay

## 2019-12-27 ENCOUNTER — Encounter (HOSPITAL_COMMUNITY): Payer: Self-pay | Admitting: Emergency Medicine

## 2019-12-27 ENCOUNTER — Emergency Department (HOSPITAL_COMMUNITY)
Admission: EM | Admit: 2019-12-27 | Discharge: 2019-12-27 | Disposition: A | Discharge status: Home / Self Care | Payer: Medicare Other | Attending: Emergency Medicine | Admitting: Emergency Medicine

## 2019-12-27 ENCOUNTER — Emergency Department (HOSPITAL_COMMUNITY): Payer: Medicare Other

## 2019-12-27 DIAGNOSIS — I5022 Chronic systolic (congestive) heart failure: Secondary | ICD-10-CM | POA: Insufficient documentation

## 2019-12-27 DIAGNOSIS — Z79899 Other long term (current) drug therapy: Secondary | ICD-10-CM | POA: Diagnosis not present

## 2019-12-27 DIAGNOSIS — F17210 Nicotine dependence, cigarettes, uncomplicated: Secondary | ICD-10-CM | POA: Insufficient documentation

## 2019-12-27 DIAGNOSIS — I110 Hypertensive heart disease with heart failure: Secondary | ICD-10-CM | POA: Insufficient documentation

## 2019-12-27 DIAGNOSIS — R0602 Shortness of breath: Secondary | ICD-10-CM | POA: Diagnosis not present

## 2019-12-27 DIAGNOSIS — R609 Edema, unspecified: Secondary | ICD-10-CM | POA: Diagnosis not present

## 2019-12-27 DIAGNOSIS — R001 Bradycardia, unspecified: Secondary | ICD-10-CM | POA: Diagnosis not present

## 2019-12-27 DIAGNOSIS — Z96691 Finger-joint replacement of right hand: Secondary | ICD-10-CM | POA: Insufficient documentation

## 2019-12-27 DIAGNOSIS — J449 Chronic obstructive pulmonary disease, unspecified: Secondary | ICD-10-CM | POA: Diagnosis not present

## 2019-12-27 DIAGNOSIS — I10 Essential (primary) hypertension: Secondary | ICD-10-CM | POA: Diagnosis not present

## 2019-12-27 DIAGNOSIS — M7989 Other specified soft tissue disorders: Secondary | ICD-10-CM | POA: Diagnosis not present

## 2019-12-27 DIAGNOSIS — R600 Localized edema: Secondary | ICD-10-CM

## 2019-12-27 DIAGNOSIS — Z96651 Presence of right artificial knee joint: Secondary | ICD-10-CM | POA: Insufficient documentation

## 2019-12-27 DIAGNOSIS — I2510 Atherosclerotic heart disease of native coronary artery without angina pectoris: Secondary | ICD-10-CM | POA: Insufficient documentation

## 2019-12-27 DIAGNOSIS — Z7982 Long term (current) use of aspirin: Secondary | ICD-10-CM | POA: Diagnosis not present

## 2019-12-27 DIAGNOSIS — I517 Cardiomegaly: Secondary | ICD-10-CM | POA: Diagnosis not present

## 2019-12-27 DIAGNOSIS — J90 Pleural effusion, not elsewhere classified: Secondary | ICD-10-CM | POA: Diagnosis not present

## 2019-12-27 LAB — CBC
HCT: 35.9 % — ABNORMAL LOW (ref 36.0–46.0)
Hemoglobin: 11.4 g/dL — ABNORMAL LOW (ref 12.0–15.0)
MCH: 30.1 pg (ref 26.0–34.0)
MCHC: 31.8 g/dL (ref 30.0–36.0)
MCV: 94.7 fL (ref 80.0–100.0)
Platelets: 245 10*3/uL (ref 150–400)
RBC: 3.79 MIL/uL — ABNORMAL LOW (ref 3.87–5.11)
RDW: 16.9 % — ABNORMAL HIGH (ref 11.5–15.5)
WBC: 7.7 10*3/uL (ref 4.0–10.5)
nRBC: 0 % (ref 0.0–0.2)

## 2019-12-27 LAB — BASIC METABOLIC PANEL
Anion gap: 10 (ref 5–15)
BUN: 20 mg/dL (ref 8–23)
CO2: 26 mmol/L (ref 22–32)
Calcium: 9.6 mg/dL (ref 8.9–10.3)
Chloride: 100 mmol/L (ref 98–111)
Creatinine, Ser: 1.02 mg/dL — ABNORMAL HIGH (ref 0.44–1.00)
GFR calc Af Amer: >60 mL/min (ref >60)
GFR calc non Af Amer: 54 mL/min — ABNORMAL LOW (ref >60)
Glucose, Bld: 103 mg/dL — ABNORMAL HIGH (ref 70–99)
Potassium: 3.5 mmol/L (ref 3.5–5.1)
Sodium: 136 mmol/L (ref 135–145)

## 2019-12-27 LAB — BRAIN NATRIURETIC PEPTIDE: B Natriuretic Peptide: 1054.2 pg/mL — ABNORMAL HIGH (ref 0.0–100.0)

## 2019-12-27 MED ORDER — POTASSIUM CHLORIDE CRYS ER 20 MEQ PO TBCR
40.0000 meq | EXTENDED_RELEASE_TABLET | Freq: Once | ORAL | Status: AC
  Administered 2019-12-27: 40 meq via ORAL
  Filled 2019-12-27: qty 2

## 2019-12-27 MED ORDER — FUROSEMIDE 10 MG/ML IJ SOLN
80.0000 mg | Freq: Once | INTRAMUSCULAR | Status: AC
  Administered 2019-12-27: 80 mg via INTRAVENOUS
  Filled 2019-12-27: qty 8

## 2019-12-27 NOTE — ED Triage Notes (Signed)
Patient arrived via [facility] from home, stating her legs had been swelling more than normal. When EMS arrived patient stated she was short of breath. Patient alert and oriented x4, EMS stated pulse was 35-45. 20g started to right AC. Patient stated she had not been around anyone that had COVID symptoms and that she had not gotten any vaccines. +4 edema noted to bilateral legs

## 2019-12-27 NOTE — ED Provider Notes (Signed)
[place] EMERGENCY DEPARTMENT Provider Note   CSN: [id number] Arrival date & time: 12/27/19  1056     History Chief Complaint  Patient presents with  . Leg Swelling    [name] is a 74 y.o. female.  Patient called EMS to transport to ED due to leg swelling. Symptoms gradually progressive in the past couple weeks, moderate-sev, constant, persistent. Was in ED within the past 1-2 weeks for same but left before being placed into room. Denies unilateral leg pain or swelling. No chest pain or discomfort. No sob or acutely worsening doe. Indicates is compliant w meds, denies recent change. Feels like is making normal amount of urine.   The history is provided by the patient and the EMS personnel.       Past Medical History:  Diagnosis Date  . Anemia   . CAD (coronary artery disease)    Prior LAD stenting with stent thrombosis occurring in a withdrawal phase of her Plavix undertaken because of trigeminal neuralgia and release surgery. It was treated medically.  . Cancer ([place])    small bladder cancer removed no  treatment  . Cataract   . CHF (congestive heart failure) ([place])   . Complication of anesthesia    massive heart attack  . GERD (gastroesophageal reflux disease)   . Hyperlipidemia   . Ischemic cardiomyopathy   . Osteoarthritis   . SOB (shortness of breath)     Patient Active Problem List   Diagnosis Date Noted  . Acute respiratory failure with hypoxia ([place]) 07/10/2018  . CHF exacerbation ([place]) 07/10/2018  . Acute encephalopathy 07/10/2018  . Elevated LFTs 07/10/2018  . Biventricular ICD (implantable cardioverter-defibrillator) in place 10/12/2013  . Essential hypertension 10/12/2013  . Lumbar post-laminectomy syndrome 05/23/2013  . 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 Excessive sleepiness 05/21/2012  . COPD (chronic obstructive pulmonary disease) ([place]) 05/21/2012  . Osteoarthritis of both knees 08/08/2011  . Anemia 03/21/2011  .  Trigeminal neuralgia 03/21/2011  . Hyperlipidemia 03/21/2011  . CAD (coronary artery disease)   . Ischemic cardiomyopathy   . Biventricular implantable cardioverter-defibrillator in situ   . Chronic systolic heart failure [facility])     Past Surgical History:  Procedure Laterality Date  . APPENDECTOMY    . BACK SURGERY     x2 back surgeries  . BI-VENTRICULAR IMPLANTABLE CARDIOVERTER DEFIBRILLATOR  (CRT-D)  2009; 05/2013   STJ CRTD generator change by Dr Caryl Comes 05/2013  . BIV PACEMAKER GENERATOR CHANGE OUT Bilateral 05/11/2013   Procedure: BIV PACEMAKER GENERATOR CHANGE OUT;  Surgeon: Deboraha Sprang, MD;  Location: [facility] CATH LAB;  Service: Cardiovascular;  Laterality: Bilateral;  . BRAIN SURGERY     x3  . EYE SURGERY Right 05/24/12  . JOINT REPLACEMENT     thumbs and knees replacement  . TUBAL LIGATION       OB History   No obstetric history on file.     Family History  Problem Relation Age of Onset  . Heart disease Father     Social History   Tobacco Use  . Smoking status: Current Some Day Smoker    Packs/day: 0.50    Years: 30.00    Pack years: 15.00    Types: Cigarettes  . Smokeless tobacco: Never Used  Substance Use Topics  . Alcohol use: No  . Drug use: No    Home Medications Prior to Admission medications   Medication Sig Start Date End Date Taking? Authorizing Provider  acetaminophen (TYLENOL) 500 MG tablet  Take 1,000 mg by mouth every 6 (six) hours as needed for headache (pain).    [provider]  albuterol (PROAIR HFA) 108 (90 Base) MCG/ACT inhaler Inhale 2 puffs into the lungs every 6 (six) hours as needed for wheezing or shortness of breath.    [provider]  aspirin EC 81 MG tablet Take 81 mg by mouth every evening.  04/19/15   Deboraha Sprang, MD  carbamazepine (TEGRETOL) 100 MG chewable tablet Chew 100 mg by mouth 3 (three) times daily.  10/20/15   [provider]  carvedilol (COREG) 3.125 MG tablet TAKE 1 TABLET BY MOUTH TWICE  DAILY WITH A MEAL 10/17/19   Jerline Pain, MD  Ferrous Sulfate 27 MG TABS Take 27 mg by mouth every evening.    [provider]  folic acid (FOLVITE) 1 MG tablet Take 1 tablet (1 mg total) by mouth daily. 07/13/18   Lavina Hamman, MD  furosemide (LASIX) 80 MG tablet Take 1 tablet (80 mg total) by mouth daily. 07/14/19   Deboraha Sprang, MD  gabapentin (NEURONTIN) 800 MG tablet Take 0.5 tablets (400 mg total) by mouth 3 (three) times daily. For pain Patient taking differently: Take 800 mg by mouth 3 (three) times daily. For pain 07/14/18   Domenic Polite, MD  levothyroxine (SYNTHROID) 88 MCG tablet Take 1 tablet (88 mcg total) by mouth daily. 07/07/19   Elayne Snare, MD  mirtazapine (REMERON) 45 MG tablet Take 45 mg by mouth at bedtime. 05/17/18   [provider]  nitroGLYCERIN (NITROSTAT) 0.4 MG SL tablet Place 1 tablet (0.4 mg total) under the tongue every 5 (five) minutes as needed for chest pain. Please make yearly appt with [name] for June. 1st attempt 07/04/19   Jerline Pain, MD  OVER THE COUNTER MEDICATION Apply 1 application topically daily as needed (knee pain). Veterinary liniment    [provider]  Oxycodone HCl 10 MG TABS Take 1 tablet (10 mg total) by mouth 2 (two) times daily as needed (pain). 07/14/18   Domenic Polite, MD  potassium chloride SA (K-DUR) 20 MEQ tablet Take 1 tablet (20 mEq total) by mouth daily. 12/08/18   Jerline Pain, MD  promethazine (PHENERGAN) 25 MG tablet Take 25 mg by mouth every 6 (six) hours as needed for nausea or vomiting.  06/22/18   [provider]  ramipril (ALTACE) 2.5 MG capsule Take 1 capsule (2.5 mg total) by mouth daily. 09/02/19   Jerline Pain, MD  simvastatin (ZOCOR) 10 MG tablet Take 1 tablet by mouth once daily 12/13/19   Jerline Pain, MD  spironolactone (ALDACTONE) 25 MG tablet Take 1/2 (one-half) tablet by mouth once daily 10/17/19   Jerline Pain, MD  venlafaxine XR (EFFEXOR-XR) 150 MG 24 hr capsule Take 300  mg by mouth daily with breakfast.  09/25/14   [provider]    Allergies    Clorazepate dipotassium  Review of Systems   Review of Systems  Constitutional: Negative for chills and fever.  HENT: Negative for sore throat.   Eyes: Negative for redness.  Respiratory: Negative for cough and shortness of breath.   Cardiovascular: Positive for leg swelling. Negative for chest pain and palpitations.  Gastrointestinal: Negative for abdominal pain, diarrhea and vomiting.  Genitourinary: Negative for flank pain.  Musculoskeletal: Negative for back pain and neck pain.  Skin: Negative for rash.  Neurological: Negative for headaches.  Hematological: Does not bruise/bleed easily.  Psychiatric/Behavioral: Negative for  confusion.    Physical Exam Updated Vital Signs LMP  (LMP Unknown)   Physical Exam Vitals and nursing note reviewed.  Constitutional:      Appearance: Normal appearance. She is well-developed.  HENT:     Head: Atraumatic.     Nose: Nose normal.     Mouth/Throat:     Mouth: Mucous membranes are moist.  Eyes:     General: No scleral icterus.    Conjunctiva/sclera: Conjunctivae normal.  Neck:     Trachea: No tracheal deviation.  Cardiovascular:     Rate and Rhythm: Normal rate and regular rhythm.     Pulses: Normal pulses.     Heart sounds: Normal heart sounds. No murmur heard.  No friction rub. No gallop.   Pulmonary:     Effort: Pulmonary effort is normal. No respiratory distress.     Breath sounds: Normal breath sounds.  Abdominal:     General: Bowel sounds are normal. There is no distension.     Palpations: Abdomen is soft.     Tenderness: There is no abdominal tenderness. There is no guarding.  Genitourinary:    Comments: No cva tenderness.  Musculoskeletal:        General: No tenderness.     Cervical back: Normal range of motion and neck supple. No rigidity. No muscular tenderness.     Comments: Bilateral foot, ankle, lower leg edema, moderate,  symmetric.   Skin:    General: Skin is warm and dry.     Findings: No rash.  Neurological:     Mental Status: She is alert.     Comments: Alert, speech normal.   Psychiatric:        Mood and Affect: Mood normal.     ED Results / Procedures / Treatments   Labs (all labs ordered are listed, but only abnormal results are displayed) Results for orders placed or performed during the hospital encounter of 12/27/19  CBC  Result Value Ref Range   WBC 7.7 4.0 - 10.5 K/uL   RBC 3.79 (L) 3.87 - 5.11 MIL/uL   Hemoglobin 11.4 (L) 12.0 - 15.0 g/dL   HCT 35.9 (L) 36 - 46 %   MCV 94.7 80.0 - 100.0 fL   MCH 30.1 26.0 - 34.0 pg   MCHC 31.8 30.0 - 36.0 g/dL   RDW 16.9 (H) 11.5 - 15.5 %   Platelets 245 150 - 400 K/uL   nRBC 0.0 0.0 - 0.2 %  Brain natriuretic peptide  Result Value Ref Range   B Natriuretic Peptide 1,054.2 (H) 0.0 - 100.0 pg/mL  Basic metabolic panel  Result Value Ref Range   Sodium 136 135 - 145 mmol/L   Potassium 3.5 3.5 - 5.1 mmol/L   Chloride 100 98 - 111 mmol/L   CO2 26 22 - 32 mmol/L   Glucose, Bld 103 (H) 70 - 99 mg/dL   BUN 20 8 - 23 mg/dL   Creatinine, Ser 1.02 (H) 0.44 - 1.00 mg/dL   Calcium 9.6 8.9 - 10.3 mg/dL   GFR calc non Af Amer 54 (L) >60 mL/min   GFR calc Af Amer >60 >60 mL/min   Anion gap 10 5 - 15   DG Chest 2 View  Result Date: 12/16/2019 CLINICAL DATA:  Shortness of breath.  CHF. EXAM: CHEST - 2 VIEW COMPARISON:  November 18, 2018 FINDINGS: Stable AICD device. Stable mild cardiomegaly. The hila and mediastinum are unchanged. No pneumothorax. No nodules or masses. No focal infiltrates. Mild interstitial  prominence without overt edema. IMPRESSION: Suggested mild pulmonary venous congestion given history without overt edema. No focal infiltrate. No other change. Electronically Signed   By: Dorise Bullion III M.D   On: 12/16/2019 17:58   DG Chest Port 1 View  Result Date: 12/27/2019 CLINICAL DATA:  Leg swelling. EXAM: PORTABLE CHEST 1 VIEW COMPARISON:   December 16, 2019. FINDINGS: Stable cardiomegaly. Left-sided pacemaker is unchanged in position. No pneumothorax or pleural effusion is noted. Lungs are clear. Bony thorax is unremarkable. IMPRESSION: No active disease. Electronically Signed   By: Marijo Conception M.D.   On: 12/27/2019 11:33    EKG EKG Interpretation  Date/Time:  Tuesday December 27 2019 11:21:27 EDT Ventricular Rate:  79 PR Interval:    QRS Duration: 183 QT Interval:  475 QTC Calculation: 498 R Axis:   -92 Text Interpretation: Electronic ventricular pacemaker Premature ventricular complexes Confirmed by Lajean Saver [phone]) on 12/27/2019 11:34:23 AM   Radiology DG Chest Port 1 View  Result Date: 12/27/2019 CLINICAL DATA:  Leg swelling. EXAM: PORTABLE CHEST 1 VIEW COMPARISON:  December 16, 2019. FINDINGS: Stable cardiomegaly. Left-sided pacemaker is unchanged in position. No pneumothorax or pleural effusion is noted. Lungs are clear. Bony thorax is unremarkable. IMPRESSION: No active disease. Electronically Signed   By: Marijo Conception M.D.   On: 12/27/2019 11:33    Procedures Procedures (including critical care time)  Medications Ordered in ED Medications - No data to display  ED Course  I have reviewed the triage vital signs and the nursing notes.  Pertinent labs & imaging results that were available during my care of the patient were reviewed by me and considered in my medical decision making (see chart for details).    MDM Rules/Calculators/A&P                           Iv ns. Continuous pulse ox and monitor. Stat labs. Pcxr.  Reviewed nursing notes and prior charts for additional history. Recent labs reviewed, k mildly low then.   Labs reviewed/interpreted by me - chem normal.  CXR reviewed/interpreted by me - no pna.  Additional labs reviewed/interpreted by me - bnp elevated (although not as high as remote prior result). Will given patient her dose of lasix iv in ED. Bmet remains pending.   On  monitor, paced rhythm - monitor rate does correlate with pulse rate. No cp. No faintness. Await pending labs.   Additional labs reviewed/interpreted by me - k borderline low, kcl po. Cr 1.   Pt is breathing comfortably, normal vitals, and appears stable for d/c.   Return precautions provided.     Final Clinical Impression(s) / ED Diagnoses Final diagnoses:  None    Rx / DC Orders ED Discharge Orders    None       Lajean Saver, MD 12/27/19 [phone]

## 2019-12-27 NOTE — ED Notes (Signed)
Call to pt family member to see about getting her a ride home

## 2019-12-27 NOTE — Discharge Instructions (Signed)
It was our pleasure to provide your ER care today - we hope that you feel better.  Elevate legs as much as possible. Limit salt intake.   Take one extra dose of your diuretic/lasix tomorrow.  Contact your doctor/cardiologist's office today or tomorrow to arrange follow up with them in coming week - discuss possible further medication adjustment then.   Return to ER if worse, new symptoms, increased trouble breathing, weak/faint, chest pain, or other concern.

## 2019-12-27 NOTE — ED Notes (Signed)
Patient was advised that her family was calling the room's phone and she did not want to talk and that the nurse could not talk to her.

## 2019-12-28 ENCOUNTER — Emergency Department (HOSPITAL_COMMUNITY)
Admission: EM | Admit: 2019-12-28 | Discharge: 2019-12-29 | Disposition: A | Discharge status: Home / Self Care | Payer: Medicare Other | Attending: Emergency Medicine | Admitting: Emergency Medicine

## 2019-12-28 ENCOUNTER — Other Ambulatory Visit: Payer: Self-pay

## 2019-12-28 DIAGNOSIS — Z20822 Contact with and (suspected) exposure to covid-19: Secondary | ICD-10-CM | POA: Insufficient documentation

## 2019-12-28 DIAGNOSIS — E039 Hypothyroidism, unspecified: Secondary | ICD-10-CM | POA: Diagnosis not present

## 2019-12-28 DIAGNOSIS — I517 Cardiomegaly: Secondary | ICD-10-CM | POA: Diagnosis not present

## 2019-12-28 DIAGNOSIS — Z8551 Personal history of malignant neoplasm of bladder: Secondary | ICD-10-CM | POA: Diagnosis not present

## 2019-12-28 DIAGNOSIS — I2510 Atherosclerotic heart disease of native coronary artery without angina pectoris: Secondary | ICD-10-CM | POA: Diagnosis not present

## 2019-12-28 DIAGNOSIS — I110 Hypertensive heart disease with heart failure: Secondary | ICD-10-CM | POA: Diagnosis not present

## 2019-12-28 DIAGNOSIS — Z79899 Other long term (current) drug therapy: Secondary | ICD-10-CM | POA: Insufficient documentation

## 2019-12-28 DIAGNOSIS — I5043 Acute on chronic combined systolic (congestive) and diastolic (congestive) heart failure: Secondary | ICD-10-CM | POA: Insufficient documentation

## 2019-12-28 DIAGNOSIS — Z7982 Long term (current) use of aspirin: Secondary | ICD-10-CM | POA: Insufficient documentation

## 2019-12-28 DIAGNOSIS — R609 Edema, unspecified: Secondary | ICD-10-CM | POA: Diagnosis not present

## 2019-12-28 DIAGNOSIS — R05 Cough: Secondary | ICD-10-CM | POA: Insufficient documentation

## 2019-12-28 DIAGNOSIS — R0602 Shortness of breath: Secondary | ICD-10-CM | POA: Diagnosis not present

## 2019-12-28 DIAGNOSIS — Z96651 Presence of right artificial knee joint: Secondary | ICD-10-CM | POA: Insufficient documentation

## 2019-12-28 DIAGNOSIS — Z79890 Hormone replacement therapy (postmenopausal): Secondary | ICD-10-CM | POA: Insufficient documentation

## 2019-12-28 DIAGNOSIS — J449 Chronic obstructive pulmonary disease, unspecified: Secondary | ICD-10-CM | POA: Diagnosis not present

## 2019-12-28 DIAGNOSIS — F17210 Nicotine dependence, cigarettes, uncomplicated: Secondary | ICD-10-CM | POA: Diagnosis not present

## 2019-12-28 DIAGNOSIS — M7989 Other specified soft tissue disorders: Secondary | ICD-10-CM | POA: Diagnosis not present

## 2019-12-28 DIAGNOSIS — R5381 Other malaise: Secondary | ICD-10-CM | POA: Diagnosis not present

## 2019-12-28 DIAGNOSIS — R069 Unspecified abnormalities of breathing: Secondary | ICD-10-CM | POA: Diagnosis not present

## 2019-12-28 MED ORDER — ALBUTEROL SULFATE (2.5 MG/3ML) 0.083% IN NEBU: 5.0000 mg | INHALATION_SOLUTION | Freq: Once | RESPIRATORY_TRACT | Status: DC

## 2019-12-28 NOTE — ED Triage Notes (Signed)
Pt presents to ED BIB [facility]. Pt c/o SOB and BLE. Pt seen last night for same.  [facility] -  128/74 78 rr - 20  97% RA

## 2019-12-29 ENCOUNTER — Emergency Department (HOSPITAL_COMMUNITY): Payer: Medicare Other

## 2019-12-29 DIAGNOSIS — R0602 Shortness of breath: Secondary | ICD-10-CM | POA: Diagnosis not present

## 2019-12-29 DIAGNOSIS — I517 Cardiomegaly: Secondary | ICD-10-CM | POA: Diagnosis not present

## 2019-12-29 LAB — BASIC METABOLIC PANEL
Anion gap: 10 (ref 5–15)
BUN: 20 mg/dL (ref 8–23)
CO2: 28 mmol/L (ref 22–32)
Calcium: 9.2 mg/dL (ref 8.9–10.3)
Chloride: 99 mmol/L (ref 98–111)
Creatinine, Ser: 1.09 mg/dL — ABNORMAL HIGH (ref 0.44–1.00)
GFR calc Af Amer: 58 mL/min — ABNORMAL LOW (ref >60)
GFR calc non Af Amer: 50 mL/min — ABNORMAL LOW (ref >60)
Glucose, Bld: 87 mg/dL (ref 70–99)
Potassium: 3.7 mmol/L (ref 3.5–5.1)
Sodium: 137 mmol/L (ref 135–145)

## 2019-12-29 LAB — CBC
HCT: 37.1 % (ref 36.0–46.0)
Hemoglobin: 11.6 g/dL — ABNORMAL LOW (ref 12.0–15.0)
MCH: 30.4 pg (ref 26.0–34.0)
MCHC: 31.3 g/dL (ref 30.0–36.0)
MCV: 97.4 fL (ref 80.0–100.0)
Platelets: 248 10*3/uL (ref 150–400)
RBC: 3.81 MIL/uL — ABNORMAL LOW (ref 3.87–5.11)
RDW: 17 % — ABNORMAL HIGH (ref 11.5–15.5)
WBC: 7.2 10*3/uL (ref 4.0–10.5)
nRBC: 0 % (ref 0.0–0.2)

## 2019-12-29 LAB — RESPIRATORY PANEL BY RT PCR (FLU A&B, COVID)
Influenza A by PCR: NEGATIVE
Influenza B by PCR: NEGATIVE
SARS Coronavirus 2 by RT PCR: NEGATIVE

## 2019-12-29 LAB — BRAIN NATRIURETIC PEPTIDE: B Natriuretic Peptide: 739.8 pg/mL — ABNORMAL HIGH (ref 0.0–100.0)

## 2019-12-29 MED ORDER — FUROSEMIDE 10 MG/ML IJ SOLN
80.0000 mg | Freq: Once | INTRAMUSCULAR | Status: AC
  Administered 2019-12-29: 80 mg via INTRAVENOUS
  Filled 2019-12-29: qty 8

## 2019-12-29 NOTE — ED Notes (Signed)
Patient verbalizes understanding of discharge instructions. Opportunity for questioning and answers were provided. Armband removed by staff, pt discharged from ED ambulatory.   

## 2019-12-29 NOTE — ED Notes (Signed)
Patient cleaned up and disposable underwear put on patient with paper scrub pants

## 2019-12-29 NOTE — Discharge Instructions (Signed)
Please read and follow all provided instructions.  Your diagnoses today include:  1. Acute on chronic combined systolic and diastolic congestive heart failure (HCC)     Tests performed today include:  An EKG of your heart  A chest x-ray  Blood counts and electrolytes  Blood test for heart failure - was high but improved from 2 days ago  Vital signs. See below for your results today.   Medications prescribed:   None  Take any prescribed medications only as directed.  Follow-up instructions: [facility] and they will see you over the next couple of days.   Return instructions:  SEEK IMMEDIATE MEDICAL ATTENTION IF:  You have severe chest pain, especially if the pain is crushing or pressure-like and spreads to the arms, back, neck, or jaw, or if you have sweating, nausea (feeling sick to your stomach), or shortness of breath. THIS IS AN EMERGENCY. Don't wait to see if the pain will go away. Get medical help at once. Call 911 or [phone] (operator). DO NOT drive yourself to the hospital.   Your chest pain gets worse and does not go away with rest.   You have an attack of chest pain lasting longer than usual, despite rest and treatment with the medications your caregiver has prescribed.   You wake from sleep with chest pain or shortness of breath.  You feel dizzy or faint.  You have chest pain not typical of your usual pain for which you originally saw your caregiver.   You have any other emergent concerns regarding your health.  Additional Information: Chest pain comes from many different causes. Your caregiver has diagnosed you as having chest pain that is not specific for one problem, but does not require admission.  You are at low risk for an acute heart condition or other serious illness.   Your vital signs today were: BP 92/69   Pulse 70   Temp (!) 97.5 F (36.4 C) (Oral)   Resp 12   LMP  (LMP Unknown)   SpO2 96%  If your  blood pressure (BP) was elevated above 135/85 this visit, please have this repeated by your doctor within one month. --------------

## 2019-12-29 NOTE — ED Provider Notes (Signed)
[facility] EMERGENCY DEPARTMENT Provider Note   CSN: [id number] Arrival date & time: 12/28/19  2203     History Chief Complaint  Patient presents with  . Leg Swelling  . Shortness of Breath    [name] is a 74 y.o. female.  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  Symptoms have been occurring for a couple of weeks.  She was seen in the emergency department 2 days ago and given a dose of IV Lasix and discharged home.  She denies fevers.  She has had a cough but states that this is at baseline and denies increase in sputum.  Denies chest pain, abdominal pain.  No nausea, vomiting, diarrhea.  Patient has been waiting in the waiting room for over 13 hours.  She denies any sick contacts.  She has not been vaccinated for coronavirus.        Past Medical History:  Diagnosis Date  . Anemia   . CAD (coronary artery disease)    Prior LAD stenting with stent thrombosis occurring in a withdrawal phase of her Plavix undertaken because of trigeminal neuralgia and release surgery. It was treated medically.  . Cancer ([place])    small bladder cancer removed no  treatment  . Cataract   . CHF (congestive heart failure) ([place])   . Complication of anesthesia    massive heart attack  . GERD (gastroesophageal reflux disease)   . Hyperlipidemia   . Ischemic cardiomyopathy   . Osteoarthritis   . SOB (shortness of breath)     Patient Active Problem List   Diagnosis Date Noted  . Acute respiratory failure with hypoxia ([place]) 07/10/2018  . CHF exacerbation ([place]) 07/10/2018  . Acute encephalopathy 07/10/2018  . Elevated LFTs 07/10/2018  . Biventricular ICD (implantable cardioverter-defibrillator) in place 10/12/2013  . Essential hypertension 10/12/2013    . Lumbar post-laminectomy syndrome 05/23/2013  . 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 Excessive sleepiness 05/21/2012  . COPD (chronic obstructive pulmonary disease) ([place]) 05/21/2012  . Osteoarthritis of both knees 08/08/2011  . Anemia 03/21/2011  . Trigeminal neuralgia 03/21/2011  . Hyperlipidemia 03/21/2011  . CAD (coronary artery disease)   . Ischemic cardiomyopathy   . Biventricular implantable cardioverter-defibrillator in situ   . Chronic systolic heart failure [facility])     Past Surgical History:  Procedure Laterality Date  . APPENDECTOMY    . BACK SURGERY     x2 back surgeries  . BI-VENTRICULAR IMPLANTABLE CARDIOVERTER DEFIBRILLATOR  (CRT-D)  2009; 05/2013   STJ CRTD generator change by Dr Caryl Comes 05/2013  . BIV PACEMAKER GENERATOR CHANGE OUT Bilateral 05/11/2013   Procedure: BIV PACEMAKER GENERATOR CHANGE OUT;  Surgeon: Deboraha Sprang, MD;  Location: [facility] CATH LAB;  Service: Cardiovascular;  Laterality: Bilateral;  . BRAIN SURGERY     x3  . EYE SURGERY Right 05/24/12  . JOINT REPLACEMENT     thumbs and knees replacement  . TUBAL LIGATION       OB History   No obstetric history on file.     Family History  Problem Relation Age of Onset  . Heart disease Father     Social History   Tobacco Use  . Smoking status: Current Some Day Smoker    Packs/day: 0.50    Years: 30.00  Pack years: 15.00    Types: Cigarettes  . Smokeless tobacco: Never Used  Substance Use Topics  . Alcohol use: No  . Drug use: No    Home Medications Prior to Admission medications   Medication Sig Start Date End Date Taking? Authorizing Provider  acetaminophen (TYLENOL) 500 MG tablet Take 1,000 mg by mouth every 6 (six) hours as needed for headache (pain).    [provider]  albuterol (PROAIR HFA) 108 (90 Base) MCG/ACT inhaler Inhale 2 puffs into the lungs every 6 (six) hours as needed for wheezing or shortness of breath.    [provider]  aspirin EC 81 MG tablet  Take 81 mg by mouth every evening.  04/19/15   Deboraha Sprang, MD  carbamazepine (TEGRETOL) 100 MG chewable tablet Chew 100 mg by mouth 3 (three) times daily.  10/20/15   [provider]  carvedilol (COREG) 3.125 MG tablet TAKE 1 TABLET BY MOUTH TWICE DAILY WITH A MEAL 10/17/19   Jerline Pain, MD  Ferrous Sulfate 27 MG TABS Take 27 mg by mouth every evening.    [provider]  folic acid (FOLVITE) 1 MG tablet Take 1 tablet (1 mg total) by mouth daily. 07/13/18   Lavina Hamman, MD  furosemide (LASIX) 80 MG tablet Take 1 tablet (80 mg total) by mouth daily. 07/14/19   Deboraha Sprang, MD  gabapentin (NEURONTIN) 800 MG tablet Take 0.5 tablets (400 mg total) by mouth 3 (three) times daily. For pain Patient taking differently: Take 800 mg by mouth 3 (three) times daily. For pain 07/14/18   Domenic Polite, MD  levothyroxine (SYNTHROID) 88 MCG tablet Take 1 tablet (88 mcg total) by mouth daily. 07/07/19   Elayne Snare, MD  mirtazapine (REMERON) 45 MG tablet Take 45 mg by mouth at bedtime. 05/17/18   [provider]  nitroGLYCERIN (NITROSTAT) 0.4 MG SL tablet Place 1 tablet (0.4 mg total) under the tongue every 5 (five) minutes as needed for chest pain. Please make yearly appt with [name] for June. 1st attempt 07/04/19   Jerline Pain, MD  OVER THE COUNTER MEDICATION Apply 1 application topically daily as needed (knee pain). Veterinary liniment    [provider]  Oxycodone HCl 10 MG TABS Take 1 tablet (10 mg total) by mouth 2 (two) times daily as needed (pain). 07/14/18   Domenic Polite, MD  potassium chloride SA (K-DUR) 20 MEQ tablet Take 1 tablet (20 mEq total) by mouth daily. 12/08/18   Jerline Pain, MD  promethazine (PHENERGAN) 25 MG tablet Take 25 mg by mouth every 6 (six) hours as needed for nausea or vomiting.  06/22/18   [provider]  ramipril (ALTACE) 2.5 MG capsule Take 1 capsule (2.5 mg total) by mouth daily. 09/02/19   Jerline Pain, MD  simvastatin  (ZOCOR) 10 MG tablet Take 1 tablet by mouth once daily 12/13/19   Jerline Pain, MD  spironolactone (ALDACTONE) 25 MG tablet Take 1/2 (one-half) tablet by mouth once daily 10/17/19   Jerline Pain, MD  venlafaxine XR (EFFEXOR-XR) 150 MG 24 hr capsule Take 300 mg by mouth daily with breakfast.  09/25/14   [provider]    Allergies    Clorazepate dipotassium  Review of Systems   Review of Systems  Constitutional: Positive for fatigue. Negative for fever.  HENT: Negative for rhinorrhea and sore throat.   Eyes: Negative for redness.  Respiratory: Positive for shortness of breath. Negative for  cough.   Cardiovascular: Positive for leg swelling. Negative for chest pain.  Gastrointestinal: Negative for abdominal pain, diarrhea, nausea and vomiting.  Genitourinary: Negative for dysuria, frequency, hematuria and urgency.  Musculoskeletal: Negative for myalgias.  Skin: Negative for rash.  Neurological: Positive for weakness (generalized). Negative for headaches.    Physical Exam Updated Vital Signs BP 90/61 (BP Location: Right Arm)   Pulse 70   Temp (!) 97.5 F (36.4 C) (Oral)   Resp 12   LMP  (LMP Unknown)   SpO2 98%   Physical Exam Vitals and nursing note reviewed.  Constitutional:      General: She is not in acute distress.    Appearance: She is well-developed.  HENT:     Head: Normocephalic and atraumatic.     Right Ear: External ear normal.     Left Ear: External ear normal.     Nose: Nose normal.  Eyes:     Conjunctiva/sclera: Conjunctivae normal.  Neck:     Vascular: JVD present.  Cardiovascular:     Rate and Rhythm: Normal rate and regular rhythm.     Heart sounds: No murmur heard.   Pulmonary:     Effort: No respiratory distress.     Breath sounds: Examination of the right-lower field reveals rales. Examination of the left-lower field reveals rales. Rales present. No wheezing or rhonchi.  Abdominal:     Palpations: Abdomen is soft.     Tenderness:  There is no abdominal tenderness. There is no guarding or rebound.  Musculoskeletal:     Cervical back: Normal range of motion and neck supple.     Right lower leg: Tenderness present. Edema present.     Left lower leg: Tenderness present. Edema present.     Comments: Symmetric 1-2+ pitting edema to mid-ankles. There is a healing abrasion L calf without erythema or cellulitis.   Skin:    General: Skin is warm and dry.     Findings: No rash.  Neurological:     General: No focal deficit present.     Mental Status: She is alert. Mental status is at baseline.     Motor: No weakness.  Psychiatric:        Mood and Affect: Mood normal.     ED Results / Procedures / Treatments   Labs (all labs ordered are listed, but only abnormal results are displayed) Labs Reviewed  BASIC METABOLIC PANEL - Abnormal; Notable for the following components:      Result Value   Creatinine, Ser 1.09 (*)    GFR calc non Af Amer 50 (*)    GFR calc Af Amer 58 (*)    All other components within normal limits  CBC - Abnormal; Notable for the following components:   RBC 3.81 (*)    Hemoglobin 11.6 (*)    RDW 17.0 (*)    All other components within normal limits  BRAIN NATRIURETIC PEPTIDE - Abnormal; Notable for the following components:   B Natriuretic Peptide 739.8 (*)    All other components within normal limits  RESPIRATORY PANEL BY RT PCR (FLU A&B, COVID)    EKG EKG Interpretation  Date/Time:  Wednesday December 28 2019 23:30:51 EDT Ventricular Rate:  70 PR Interval:    QRS Duration: 108 QT Interval:  400 QTC Calculation: 432 R Axis:   -76 Text Interpretation: Ventricular-paced rhythm Abnormal ECG Confirmed by Merrily Pew [phone]) on 12/29/2019 2:40:42 AM   Radiology DG Chest 2 View  Result Date: 12/29/2019 CLINICAL  DATA:  Shortness of breath. EXAM: CHEST - 2 VIEW COMPARISON:  December 27, 2019. FINDINGS: Stable cardiomegaly with increased central pulmonary vascular congestion. No  pneumothorax or pleural effusion is noted. Left-sided pacemaker is unchanged in position. Possible patchy airspace opacities are noted bilaterally suggesting multifocal pneumonia. Bony thorax is unremarkable. IMPRESSION: Stable cardiomegaly with increased central pulmonary vascular congestion. Possible patchy airspace opacities are noted bilaterally suggesting multifocal pneumonia. Electronically Signed   By: Marijo Conception M.D.   On: 12/29/2019 10:25    Procedures Procedures (including critical care time)  Medications Ordered in ED Medications  furosemide (LASIX) injection 80 mg (80 mg Intravenous Given 12/29/19 1341)    ED Course  I have reviewed the triage vital signs and the nursing notes.  Pertinent labs & imaging results that were available during my care of the patient were reviewed by me and considered in my medical decision making (see chart for details).  Patient seen and examined.  Reviewed work-up from previous visit.  Reviewed previous echo result.  Chest x-ray appears worse today.  Labs, BNP, Covid testing pending.  Will give dose of IV Lasix as patient does appear to be a bit fluid overloaded.  No fevers.   Vital signs reviewed and are as follows: BP 90/61 (BP Location: Right Arm)   Pulse 70   Temp (!) 97.5 F (36.4 C) (Oral)   Resp 12   LMP  (LMP Unknown)   SpO2 98%   2:09 PM BNP 1054 > 740 over the past 2 days.   Patient will be set up for hospital at home program.  She has received IV Lasix here.  Assuming good UOP, plan for discharged home with close monitoring through hospital home program.  Pt discussed with Dr. Roslynn Amble who has seen.   3:48 PM RN reports >500cc UOP.  Patient urged to return with worsening symptoms or other concerns. Patient verbalized understanding and agrees with plan.      MDM Rules/Calculators/A&P                          Patient presents with signs of continued fluid overload.  Her main complaint is lower leg and feet swelling and  pain.  She also has shortness of breath.  Exam with some signs of fluid overload.  BNP is actually better today than it was 2 days ago.  Creatinine is 1.09. K is okay. I doubt pneumonia given lack of fever, elevated white blood cell count.  Covid testing negative.  Patient is not hypoxic, tachycardic.  She has been evaluated for inclusion in hospital at home program and has agreed to this.  Comfortable with discharge home as she will have close monitoring and follow-up.   Final Clinical Impression(s) / ED Diagnoses Final diagnoses:  Acute on chronic combined systolic and diastolic congestive heart failure [facility])    Rx / DC Orders ED Discharge Orders    None       Carlisle Cater, PA-C 12/29/19 1548    Lucrezia Starch, MD 01/02/20 [phone]

## 2019-12-29 NOTE — ED Notes (Signed)
Call the sister Edwena Felty at [phone]

## 2019-12-29 NOTE — ED Notes (Signed)
Pt refusing xray.

## 2020-01-01 ENCOUNTER — Emergency Department (HOSPITAL_COMMUNITY)
Admission: EM | Admit: 2020-01-01 | Discharge: 2020-01-02 | Disposition: A | Discharge status: Home / Self Care | Payer: Medicare Other | Attending: Emergency Medicine | Admitting: Emergency Medicine

## 2020-01-01 ENCOUNTER — Emergency Department (HOSPITAL_COMMUNITY): Payer: Medicare Other

## 2020-01-01 DIAGNOSIS — R0602 Shortness of breath: Secondary | ICD-10-CM | POA: Insufficient documentation

## 2020-01-01 DIAGNOSIS — Z5321 Procedure and treatment not carried out due to patient leaving prior to being seen by health care provider: Secondary | ICD-10-CM | POA: Diagnosis not present

## 2020-01-01 DIAGNOSIS — R600 Localized edema: Secondary | ICD-10-CM | POA: Insufficient documentation

## 2020-01-01 DIAGNOSIS — F17210 Nicotine dependence, cigarettes, uncomplicated: Secondary | ICD-10-CM | POA: Insufficient documentation

## 2020-01-01 DIAGNOSIS — R609 Edema, unspecified: Secondary | ICD-10-CM | POA: Diagnosis not present

## 2020-01-01 DIAGNOSIS — I959 Hypotension, unspecified: Secondary | ICD-10-CM | POA: Diagnosis not present

## 2020-01-01 LAB — BASIC METABOLIC PANEL
Anion gap: 9 (ref 5–15)
BUN: 15 mg/dL (ref 8–23)
CO2: 30 mmol/L (ref 22–32)
Calcium: 9.8 mg/dL (ref 8.9–10.3)
Chloride: 99 mmol/L (ref 98–111)
Creatinine, Ser: 1.15 mg/dL — ABNORMAL HIGH (ref 0.44–1.00)
GFR calc Af Amer: 54 mL/min — ABNORMAL LOW (ref >60)
GFR calc non Af Amer: 47 mL/min — ABNORMAL LOW (ref >60)
Glucose, Bld: 98 mg/dL (ref 70–99)
Potassium: 3.7 mmol/L (ref 3.5–5.1)
Sodium: 138 mmol/L (ref 135–145)

## 2020-01-01 LAB — CBC
HCT: 37.6 % (ref 36.0–46.0)
Hemoglobin: 11.9 g/dL — ABNORMAL LOW (ref 12.0–15.0)
MCH: 30.3 pg (ref 26.0–34.0)
MCHC: 31.6 g/dL (ref 30.0–36.0)
MCV: 95.7 fL (ref 80.0–100.0)
Platelets: 258 10*3/uL (ref 150–400)
RBC: 3.93 MIL/uL (ref 3.87–5.11)
RDW: 17.2 % — ABNORMAL HIGH (ref 11.5–15.5)
WBC: 9.3 10*3/uL (ref 4.0–10.5)
nRBC: 0 % (ref 0.0–0.2)

## 2020-01-01 NOTE — ED Triage Notes (Signed)
To triage via EMS from home.  Pt presents for pedal edema and shortness of breath.  Pt was waiting on EMS  In driveway smoking cigarette. Has not received covid vaccine.  Lungs clear.  BP 95/65 SpO2 95% room air.

## 2020-01-02 NOTE — ED Notes (Signed)
Patient states her friend, who is also a patient, is taking her home

## 2020-01-09 DIAGNOSIS — I5022 Chronic systolic (congestive) heart failure: Secondary | ICD-10-CM | POA: Diagnosis not present

## 2020-01-09 DIAGNOSIS — F329 Major depressive disorder, single episode, unspecified: Secondary | ICD-10-CM | POA: Diagnosis not present

## 2020-01-09 DIAGNOSIS — G500 Trigeminal neuralgia: Secondary | ICD-10-CM | POA: Diagnosis not present

## 2020-01-09 DIAGNOSIS — G894 Chronic pain syndrome: Secondary | ICD-10-CM | POA: Diagnosis not present

## 2020-01-14 ENCOUNTER — Encounter (HOSPITAL_COMMUNITY): Payer: Self-pay | Admitting: Pulmonary Disease

## 2020-01-14 ENCOUNTER — Other Ambulatory Visit: Payer: Self-pay

## 2020-01-14 ENCOUNTER — Emergency Department (HOSPITAL_COMMUNITY): Payer: Medicare Other

## 2020-01-14 ENCOUNTER — Inpatient Hospital Stay (HOSPITAL_COMMUNITY)
Admission: EM | Admit: 2020-01-14 | Discharge: 2020-01-30 | DRG: 871 | Disposition: E | Payer: Medicare Other | Attending: Internal Medicine | Admitting: Internal Medicine

## 2020-01-14 DIAGNOSIS — I2510 Atherosclerotic heart disease of native coronary artery without angina pectoris: Secondary | ICD-10-CM | POA: Diagnosis present

## 2020-01-14 DIAGNOSIS — J440 Chronic obstructive pulmonary disease with acute lower respiratory infection: Secondary | ICD-10-CM | POA: Diagnosis present

## 2020-01-14 DIAGNOSIS — I5022 Chronic systolic (congestive) heart failure: Secondary | ICD-10-CM | POA: Diagnosis present

## 2020-01-14 DIAGNOSIS — I255 Ischemic cardiomyopathy: Secondary | ICD-10-CM | POA: Diagnosis present

## 2020-01-14 DIAGNOSIS — E785 Hyperlipidemia, unspecified: Secondary | ICD-10-CM | POA: Diagnosis present

## 2020-01-14 DIAGNOSIS — E119 Type 2 diabetes mellitus without complications: Secondary | ICD-10-CM | POA: Diagnosis present

## 2020-01-14 DIAGNOSIS — Z7189 Other specified counseling: Secondary | ICD-10-CM | POA: Diagnosis not present

## 2020-01-14 DIAGNOSIS — J449 Chronic obstructive pulmonary disease, unspecified: Secondary | ICD-10-CM | POA: Diagnosis present

## 2020-01-14 DIAGNOSIS — Z6825 Body mass index (BMI) 25.0-25.9, adult: Secondary | ICD-10-CM

## 2020-01-14 DIAGNOSIS — U071 COVID-19: Secondary | ICD-10-CM | POA: Diagnosis present

## 2020-01-14 DIAGNOSIS — Z8249 Family history of ischemic heart disease and other diseases of the circulatory system: Secondary | ICD-10-CM

## 2020-01-14 DIAGNOSIS — N179 Acute kidney failure, unspecified: Secondary | ICD-10-CM | POA: Diagnosis present

## 2020-01-14 DIAGNOSIS — R6520 Severe sepsis without septic shock: Secondary | ICD-10-CM | POA: Diagnosis present

## 2020-01-14 DIAGNOSIS — J441 Chronic obstructive pulmonary disease with (acute) exacerbation: Secondary | ICD-10-CM | POA: Diagnosis present

## 2020-01-14 DIAGNOSIS — I10 Essential (primary) hypertension: Secondary | ICD-10-CM | POA: Diagnosis not present

## 2020-01-14 DIAGNOSIS — I252 Old myocardial infarction: Secondary | ICD-10-CM

## 2020-01-14 DIAGNOSIS — F17210 Nicotine dependence, cigarettes, uncomplicated: Secondary | ICD-10-CM | POA: Diagnosis present

## 2020-01-14 DIAGNOSIS — A4189 Other specified sepsis: Secondary | ICD-10-CM | POA: Diagnosis not present

## 2020-01-14 DIAGNOSIS — Z95810 Presence of automatic (implantable) cardiac defibrillator: Secondary | ICD-10-CM | POA: Diagnosis not present

## 2020-01-14 DIAGNOSIS — J9601 Acute respiratory failure with hypoxia: Secondary | ICD-10-CM

## 2020-01-14 DIAGNOSIS — J1282 Pneumonia due to coronavirus disease 2019: Secondary | ICD-10-CM | POA: Diagnosis present

## 2020-01-14 DIAGNOSIS — Z96693 Finger-joint replacement, bilateral: Secondary | ICD-10-CM | POA: Diagnosis present

## 2020-01-14 DIAGNOSIS — G9341 Metabolic encephalopathy: Secondary | ICD-10-CM | POA: Diagnosis present

## 2020-01-14 DIAGNOSIS — I5023 Acute on chronic systolic (congestive) heart failure: Secondary | ICD-10-CM | POA: Diagnosis present

## 2020-01-14 DIAGNOSIS — Z888 Allergy status to other drugs, medicaments and biological substances status: Secondary | ICD-10-CM

## 2020-01-14 DIAGNOSIS — Z66 Do not resuscitate: Secondary | ICD-10-CM

## 2020-01-14 DIAGNOSIS — Z79899 Other long term (current) drug therapy: Secondary | ICD-10-CM

## 2020-01-14 DIAGNOSIS — J80 Acute respiratory distress syndrome: Secondary | ICD-10-CM

## 2020-01-14 DIAGNOSIS — Z955 Presence of coronary angioplasty implant and graft: Secondary | ICD-10-CM | POA: Diagnosis not present

## 2020-01-14 DIAGNOSIS — M1990 Unspecified osteoarthritis, unspecified site: Secondary | ICD-10-CM | POA: Diagnosis present

## 2020-01-14 DIAGNOSIS — Z8551 Personal history of malignant neoplasm of bladder: Secondary | ICD-10-CM

## 2020-01-14 DIAGNOSIS — R0602 Shortness of breath: Secondary | ICD-10-CM | POA: Diagnosis not present

## 2020-01-14 DIAGNOSIS — Z79890 Hormone replacement therapy (postmenopausal): Secondary | ICD-10-CM

## 2020-01-14 DIAGNOSIS — Z515 Encounter for palliative care: Secondary | ICD-10-CM

## 2020-01-14 DIAGNOSIS — J9600 Acute respiratory failure, unspecified whether with hypoxia or hypercapnia: Secondary | ICD-10-CM | POA: Diagnosis not present

## 2020-01-14 DIAGNOSIS — E43 Unspecified severe protein-calorie malnutrition: Secondary | ICD-10-CM | POA: Diagnosis present

## 2020-01-14 DIAGNOSIS — E890 Postprocedural hypothyroidism: Secondary | ICD-10-CM | POA: Diagnosis present

## 2020-01-14 DIAGNOSIS — K219 Gastro-esophageal reflux disease without esophagitis: Secondary | ICD-10-CM | POA: Diagnosis present

## 2020-01-14 DIAGNOSIS — Z96653 Presence of artificial knee joint, bilateral: Secondary | ICD-10-CM | POA: Diagnosis present

## 2020-01-14 DIAGNOSIS — J438 Other emphysema: Secondary | ICD-10-CM | POA: Diagnosis not present

## 2020-01-14 DIAGNOSIS — I2721 Secondary pulmonary arterial hypertension: Secondary | ICD-10-CM | POA: Diagnosis present

## 2020-01-14 DIAGNOSIS — Z7982 Long term (current) use of aspirin: Secondary | ICD-10-CM

## 2020-01-14 DIAGNOSIS — I110 Hypertensive heart disease with heart failure: Secondary | ICD-10-CM | POA: Diagnosis present

## 2020-01-14 LAB — COMPREHENSIVE METABOLIC PANEL
ALT: 59 U/L — ABNORMAL HIGH (ref 0–44)
AST: 102 U/L — ABNORMAL HIGH (ref 15–41)
Albumin: 4.3 g/dL (ref 3.5–5.0)
Alkaline Phosphatase: 148 U/L — ABNORMAL HIGH (ref 38–126)
Anion gap: 28 — ABNORMAL HIGH (ref 5–15)
BUN: 25 mg/dL — ABNORMAL HIGH (ref 8–23)
CO2: 16 mmol/L — ABNORMAL LOW (ref 22–32)
Calcium: 9.6 mg/dL (ref 8.9–10.3)
Chloride: 95 mmol/L — ABNORMAL LOW (ref 98–111)
Creatinine, Ser: 1.57 mg/dL — ABNORMAL HIGH (ref 0.44–1.00)
GFR, Estimated: 32 mL/min — ABNORMAL LOW (ref >60)
Glucose, Bld: 119 mg/dL — ABNORMAL HIGH (ref 70–99)
Potassium: 4.6 mmol/L (ref 3.5–5.1)
Sodium: 139 mmol/L (ref 135–145)
Total Bilirubin: 2.2 mg/dL — ABNORMAL HIGH (ref 0.3–1.2)
Total Protein: 8.1 g/dL (ref 6.5–8.1)

## 2020-01-14 LAB — PROCALCITONIN: Procalcitonin: 0.32 ng/mL

## 2020-01-14 LAB — BLOOD GAS, VENOUS
Acid-base deficit: 3.4 mmol/L — ABNORMAL HIGH (ref 0.0–2.0)
Bicarbonate: 23.6 mmol/L (ref 20.0–28.0)
O2 Saturation: 7.8 %
Patient temperature: 98.6
pCO2, Ven: 50.5 mmHg (ref 44.0–60.0)
pH, Ven: 7.292 (ref 7.250–7.430)
pO2, Ven: <31 mmHg — CL (ref 32.0–45.0)

## 2020-01-14 LAB — LACTIC ACID, PLASMA: Lactic Acid, Venous: 7.9 mmol/L (ref 0.5–1.9)

## 2020-01-14 LAB — GLUCOSE, CAPILLARY: Glucose-Capillary: 116 mg/dL — ABNORMAL HIGH (ref 70–99)

## 2020-01-14 LAB — RESPIRATORY PANEL BY RT PCR (FLU A&B, COVID)
Influenza A by PCR: NEGATIVE
Influenza B by PCR: NEGATIVE
SARS Coronavirus 2 by RT PCR: POSITIVE — AB

## 2020-01-14 LAB — CBC WITH DIFFERENTIAL/PLATELET
Abs Immature Granulocytes: 0.1 10*3/uL — ABNORMAL HIGH (ref 0.00–0.07)
Basophils Absolute: 0 10*3/uL (ref 0.0–0.1)
Basophils Relative: 0 %
Eosinophils Absolute: 0 10*3/uL (ref 0.0–0.5)
Eosinophils Relative: 0 %
HCT: 35.3 % — ABNORMAL LOW (ref 36.0–46.0)
Hemoglobin: 11.5 g/dL — ABNORMAL LOW (ref 12.0–15.0)
Immature Granulocytes: 1 %
Lymphocytes Relative: 9 %
Lymphs Abs: 0.7 10*3/uL (ref 0.7–4.0)
MCH: 31.6 pg (ref 26.0–34.0)
MCHC: 32.6 g/dL (ref 30.0–36.0)
MCV: 97 fL (ref 80.0–100.0)
Monocytes Absolute: 0.5 10*3/uL (ref 0.1–1.0)
Monocytes Relative: 7 %
Neutro Abs: 6.7 10*3/uL (ref 1.7–7.7)
Neutrophils Relative %: 83 %
Platelets: 143 10*3/uL — ABNORMAL LOW (ref 150–400)
RBC: 3.64 MIL/uL — ABNORMAL LOW (ref 3.87–5.11)
RDW: 17.8 % — ABNORMAL HIGH (ref 11.5–15.5)
WBC: 8 10*3/uL (ref 4.0–10.5)
nRBC: 0 % (ref 0.0–0.2)

## 2020-01-14 LAB — BRAIN NATRIURETIC PEPTIDE: B Natriuretic Peptide: 2280.8 pg/mL — ABNORMAL HIGH (ref 0.0–100.0)

## 2020-01-14 LAB — CREATININE, SERUM
Creatinine, Ser: 1.4 mg/dL — ABNORMAL HIGH (ref 0.44–1.00)
GFR, Estimated: 37 mL/min — ABNORMAL LOW (ref >60)

## 2020-01-14 LAB — TRIGLYCERIDES: Triglycerides: 97 mg/dL (ref <150)

## 2020-01-14 LAB — LACTATE DEHYDROGENASE
LDH: 574 U/L — ABNORMAL HIGH (ref 98–192)
LDH: 678 U/L — ABNORMAL HIGH (ref 98–192)

## 2020-01-14 LAB — D-DIMER, QUANTITATIVE: D-Dimer, Quant: 1.55 ug/mL-FEU — ABNORMAL HIGH (ref 0.00–0.50)

## 2020-01-14 LAB — C-REACTIVE PROTEIN: CRP: 6.8 mg/dL — ABNORMAL HIGH (ref <1.0)

## 2020-01-14 LAB — FERRITIN: Ferritin: 999 ng/mL — ABNORMAL HIGH (ref 11–307)

## 2020-01-14 LAB — MRSA PCR SCREENING: MRSA by PCR: NEGATIVE

## 2020-01-14 LAB — FIBRINOGEN: Fibrinogen: 239 mg/dL (ref 210–475)

## 2020-01-14 MED ORDER — INSULIN ASPART 100 UNIT/ML ~~LOC~~ SOLN
0.0000 [IU] | SUBCUTANEOUS | Status: DC
  Administered 2020-01-15 – 2020-01-17 (×3): 1 [IU] via SUBCUTANEOUS
  Administered 2020-01-17: 2 [IU] via SUBCUTANEOUS
  Administered 2020-01-17 – 2020-01-18 (×2): 1 [IU] via SUBCUTANEOUS

## 2020-01-14 MED ORDER — FAMOTIDINE IN NACL 20-0.9 MG/50ML-% IV SOLN
20.0000 mg | INTRAVENOUS | Status: DC
  Administered 2020-01-14 – 2020-01-15 (×2): 20 mg via INTRAVENOUS
  Filled 2020-01-14 (×2): qty 50

## 2020-01-14 MED ORDER — SODIUM CHLORIDE 0.9 % IV SOLN: 1.0000 g | Freq: Once | INTRAVENOUS | Status: DC

## 2020-01-14 MED ORDER — SODIUM CHLORIDE 0.9 % IV SOLN: 2.0000 g | INTRAVENOUS | Status: DC

## 2020-01-14 MED ORDER — VANCOMYCIN HCL IN DEXTROSE 1-5 GM/200ML-% IV SOLN
1000.0000 mg | Freq: Once | INTRAVENOUS | Status: AC
  Administered 2020-01-14: 1000 mg via INTRAVENOUS
  Filled 2020-01-14: qty 200

## 2020-01-14 MED ORDER — SODIUM CHLORIDE 0.9 % IV SOLN: 500.0000 mg | Freq: Once | INTRAVENOUS | Status: DC

## 2020-01-14 MED ORDER — TOCILIZUMAB 400 MG/20ML IV SOLN: 8.0000 mg/kg | Freq: Once | INTRAVENOUS | Status: DC

## 2020-01-14 MED ORDER — FUROSEMIDE 10 MG/ML IJ SOLN
60.0000 mg | Freq: Once | INTRAMUSCULAR | Status: AC
  Administered 2020-01-14: 60 mg via INTRAVENOUS
  Filled 2020-01-14: qty 8

## 2020-01-14 MED ORDER — MAGNESIUM SULFATE 2 GM/50ML IV SOLN
2.0000 g | Freq: Once | INTRAVENOUS | Status: AC
  Administered 2020-01-14: 2 g via INTRAVENOUS
  Filled 2020-01-14: qty 50

## 2020-01-14 MED ORDER — PREDNISONE 20 MG PO TABS
50.0000 mg | ORAL_TABLET | Freq: Every day | ORAL | Status: DC
  Administered 2020-01-18: 50 mg via ORAL
  Filled 2020-01-14: qty 2

## 2020-01-14 MED ORDER — SODIUM CHLORIDE 0.9 % IV BOLUS
1000.0000 mL | Freq: Once | INTRAVENOUS | Status: AC
  Administered 2020-01-14: 1000 mL via INTRAVENOUS

## 2020-01-14 MED ORDER — SODIUM CHLORIDE 0.9 % IV SOLN
2.0000 g | Freq: Once | INTRAVENOUS | Status: AC
  Administered 2020-01-14: 2 g via INTRAVENOUS
  Filled 2020-01-14 (×2): qty 2

## 2020-01-14 MED ORDER — CHLORHEXIDINE GLUCONATE CLOTH 2 % EX PADS
6.0000 | MEDICATED_PAD | Freq: Every day | CUTANEOUS | Status: DC
  Administered 2020-01-14 – 2020-01-18 (×4): 6 via TOPICAL

## 2020-01-14 MED ORDER — VANCOMYCIN HCL IN DEXTROSE 1-5 GM/200ML-% IV SOLN: 1000.0000 mg | INTRAVENOUS | Status: DC

## 2020-01-14 MED ORDER — METHYLPREDNISOLONE SODIUM SUCC 125 MG IJ SOLR
1.0000 mg/kg | Freq: Two times a day (BID) | INTRAMUSCULAR | Status: AC
  Administered 2020-01-14 – 2020-01-17 (×6): 58.75 mg via INTRAVENOUS
  Filled 2020-01-14 (×6): qty 2

## 2020-01-14 MED ORDER — SODIUM CHLORIDE 0.9 % IV SOLN
200.0000 mg | Freq: Once | INTRAVENOUS | Status: AC
  Administered 2020-01-14: 200 mg via INTRAVENOUS
  Filled 2020-01-14: qty 40

## 2020-01-14 MED ORDER — ALBUTEROL (5 MG/ML) CONTINUOUS INHALATION SOLN
15.0000 mg/h | INHALATION_SOLUTION | Freq: Once | RESPIRATORY_TRACT | Status: AC
  Administered 2020-01-14: 15 mg/h via RESPIRATORY_TRACT
  Filled 2020-01-14: qty 20

## 2020-01-14 MED ORDER — INSULIN DETEMIR 100 UNIT/ML ~~LOC~~ SOLN
0.0750 [IU]/kg | Freq: Two times a day (BID) | SUBCUTANEOUS | Status: DC
  Filled 2020-01-14: qty 0.04

## 2020-01-14 MED ORDER — ENOXAPARIN SODIUM 30 MG/0.3ML ~~LOC~~ SOLN
30.0000 mg | SUBCUTANEOUS | Status: DC
  Administered 2020-01-14: 30 mg via SUBCUTANEOUS
  Filled 2020-01-14: qty 0.3

## 2020-01-14 MED ORDER — SODIUM CHLORIDE 0.9 % IV SOLN
100.0000 mg | Freq: Every day | INTRAVENOUS | Status: AC
  Administered 2020-01-15 – 2020-01-18 (×4): 100 mg via INTRAVENOUS
  Filled 2020-01-14 (×4): qty 20

## 2020-01-14 NOTE — ED Notes (Signed)
Date and time results received: 01/13/2020 4:12 PM  (use smartphrase ".now" to insert current time)  Test: lactic Critical Value: 7.9  Name of Provider Notified: Dr. Jeanell Sparrow  Orders Received? Or Actions Taken?: Verbal order for 1L fluids

## 2020-01-14 NOTE — ED Triage Notes (Signed)
Pt coming from home. Home health nurse found her SHOB and on floor, altered mental status  solumedral 125 duoneb Albuterol neb 20 L hand  chf  Copd 82% on EMS arrival  92-96% on Neb  80 HR

## 2020-01-14 NOTE — ED Notes (Signed)
Pt becoming increasing combative, scratching, kicking, punching.  Pt continuously calling out for "[name]"  RN attempted to reorient pt, unsuccessful.  Pt pulling off continuous neb treatment, mask replaced multiple times by RN.  EDP [place] aware.  Pt placed in soft mittens for safety.

## 2020-01-14 NOTE — ED Notes (Signed)
EDP Plunkett made aware of pts increased work of breathing.  O2 sats remained at appx 94% with aerosol mask.  EDP gave verbal order to notify RT to place pt on hi-flow nasal cannula.  RT Angie made aware.

## 2020-01-14 NOTE — Progress Notes (Signed)
Pt had arrived via ems wearing a nebulizer. MD had ordered continuous neb. Pt wheezing and tight. RT asked for covid test.Pt was swabbed. Since pt was already receiving a nebulizer I started hour long nebulizer. RN called and notified RT that pt came back positive for COVID.

## 2020-01-14 NOTE — Progress Notes (Signed)
Pharmacy Antibiotic Note  [name] is a 74 y.o. female with recent ED visit for CHF admitted on 01/03/2020 with acute respiratory failure due to COVID PNA and possible HCAP.  Pharmacy has been consulted for vancomycin and cefepime dosing.  Plan: Cefepime 2 g iv q 24 hours  Vancomycin 1000 mg iv q 24 hours Predicted AUC 459  Will f/u renal function, cultures, and clinical course  Height: 5\' 3"  (160 cm) Weight: 59 kg (130 lb) IBW/kg (Calculated) : 52.4  Temp (24hrs), Avg:99 F (37.2 C), Min:98.4 F (36.9 C), Max:99.5 F (37.5 C)  Recent Labs  Lab 01/23/2020 1138 01/04/2020 1414  WBC  --  8.0  CREATININE 1.57*  --   LATICACIDVEN  --  7.9*    Estimated Creatinine Clearance: 25.6 mL/min (A) (by C-G formula based on SCr of 1.57 mg/dL (H)).    Allergies  Allergen Reactions  . Clorazepate Dipotassium Other (See Comments)    Caused confusion per husband    Antimicrobials this admission: 10/16 cefepime >>  10/16 vancomycin >>   Dose adjustments this admission:  Microbiology results: 10/16 BCx: sent 10/16 Resp PCR: COVID +, Flu A/B - 10/16 MRSA PCR:   Thank you for allowing pharmacy to be a part of this patient's care.  Napoleon Form 01/27/2020 5:34 PM

## 2020-01-14 NOTE — H&P (Signed)
NAME:  [name], MRN:  [id number], DOB:  Jul 18, 1945, LOS: 0 ADMISSION DATE:  01/20/2020, CONSULTATION DATE: 01/11/2020 REFERRING MD: Blanchie Dessert, MD, CHIEF COMPLAINT: Acute respiratory failure, COVID-19 pneumonia  Brief History   74 year old unvaccinated female with significant past medical history of coronary artery disease, CHF with EF of 20-25%, COPD with recent ED visit for CHF exacerbation presenting with acute respiratory failure, altered mental status, elevated lactic acid and sepsis in the setting of COVID-19 infection.  Past Medical History    has a past medical history of Anemia, CAD (coronary artery disease), Cancer ([place]), Cataract, CHF (congestive heart failure) ([place]), Complication of anesthesia, GERD (gastroesophageal reflux disease), Hyperlipidemia, Ischemic cardiomyopathy, Osteoarthritis, and SOB (shortness of breath).  Significant Hospital Events   10/16 admit  Consults:  PCCM  Procedures:    Significant Diagnostic Tests:  Chest x-ray 10/16-cardiomegaly, widespread bilateral airspace disease.  Micro Data:  Blood cultures  Antimicrobials:  Vanco 10/16 Cefepime 10/16  Interim history/subjective:    Objective   Blood pressure 132/62, pulse 69, temperature 99.5 F (37.5 C), temperature source Rectal, resp. rate (!) 32, height 5\' 3"  (1.6 m), weight 59 kg, SpO2 93 %.    FiO2 (%):  [40 %] 40 %   Intake/Output Summary (Last 24 hours) at 01/02/2020 1647 Last data filed at 01/21/2020 1454 Gross per 24 hour  Intake 150 ml  Output --  Net 150 ml   Filed Weights   01/26/2020 1314  Weight: 59 kg    Examination: Gen:      No acute distress HEENT:  EOMI, sclera anicteric, dry mucous membrane Neck:     No masses; no thyromegaly Lungs:   Coarse bilateral rhonchi, scattered wheeze CV:         Regular rate and rhythm; no murmurs Abd:      + bowel sounds; soft, non-tender; no palpable masses, no distension Ext:    No edema; adequate peripheral  perfusion Skin:      Warm and dry; no rash Neuro: Opens eyes to commands, confused  Resolved Hospital Problem list     Assessment & Plan:  Severe COVID-19 pneumonia Continue high flow nasal cannula Starting remdesivir, steroids. Actemra Start broad antibiotic coverage given recent hospitalization Follow cultures  Severe sepsis with elevated lactic acid Gentle IV fluids Antibiotics as above Follow lactic acid  Coronary artery disease, cardiomyopathy with EF 20-25% Judicious use of fluids Check troponins  Goals of care Very poor prognosis.  I do not believe she will survive this hospitalization Discussed with daughter and husband  [place] apparently not been taking care of herself or taking her medications at home They are aware of how sick she is.  They confirmed DNR status. Will be okay with comfort care if she deteriorates tonight  Best practice:  Diet: NPO Pain/Anxiety/Delirium protocol (if indicated): NA VAP protocol (if indicated):NA DVT prophylaxis: Lovenox GI prophylaxis: pepcid Glucose control: SSI Mobility: Bed Code Status: DNR Family Communication: See above Disposition: ICU  Labs   CBC: Recent Labs  Lab 01/20/2020 1414  WBC 8.0  NEUTROABS 6.7  HGB 11.5*  HCT 35.3*  MCV 97.0  PLT 143*    Basic Metabolic Panel: Recent Labs  Lab 01/23/2020 1138  NA 139  K 4.6  CL 95*  CO2 16*  GLUCOSE 119*  BUN 25*  CREATININE 1.57*  CALCIUM 9.6   GFR: Estimated Creatinine Clearance: 25.6 mL/min (A) (by C-G formula based on SCr of 1.57 mg/dL (H)). Recent Labs  Lab 12/30/2019 1414  PROCALCITON 0.29  WBC 8.0  LATICACIDVEN 7.9*    Liver Function Tests: Recent Labs  Lab 01/19/2020 1138  AST 102*  ALT 59*  ALKPHOS 148*  BILITOT 2.2*  PROT 8.1  ALBUMIN 4.3   No results for input(s): LIPASE, AMYLASE in the last 168 hours. No results for input(s): AMMONIA in the last 168 hours.  ABG    Component Value Date/Time   PHART 7.384 07/10/2018 1358    PCO2ART 45.6 07/10/2018 1358   PO2ART 66.8 (L) 07/10/2018 1358   HCO3 23.6 01/07/2020 1138   TCO2 26 07/10/2018 0313   ACIDBASEDEF 3.4 (H) 01/10/2020 1138   O2SAT 7.8 01/15/2020 1138     Coagulation Profile: No results for input(s): INR, PROTIME in the last 168 hours.  Cardiac Enzymes: No results for input(s): CKTOTAL, CKMB, CKMBINDEX, TROPONINI in the last 168 hours.  HbA1C: No results found for: HGBA1C  CBG: No results for input(s): GLUCAP in the last 168 hours.  Review of Systems:   Unable to obtain due to altered mental status  Past Medical History  She,  has a past medical history of Anemia, CAD (coronary artery disease), Cancer ([place]), Cataract, CHF (congestive heart failure) ([place]), Complication of anesthesia, GERD (gastroesophageal reflux disease), Hyperlipidemia, Ischemic cardiomyopathy, Osteoarthritis, and SOB (shortness of breath).   Surgical History    Past Surgical History:  Procedure Laterality Date  . APPENDECTOMY    . BACK SURGERY     x2 back surgeries  . BI-VENTRICULAR IMPLANTABLE CARDIOVERTER DEFIBRILLATOR  (CRT-D)  2009; 05/2013   STJ CRTD generator change by Dr Caryl Comes 05/2013  . BIV PACEMAKER GENERATOR CHANGE OUT Bilateral 05/11/2013   Procedure: BIV PACEMAKER GENERATOR CHANGE OUT;  Surgeon: Deboraha Sprang, MD;  Location: [facility] CATH LAB;  Service: Cardiovascular;  Laterality: Bilateral;  . BRAIN SURGERY     x3  . EYE SURGERY Right 05/24/12  . JOINT REPLACEMENT     thumbs and knees replacement  . TUBAL LIGATION       Social History   reports that she has been smoking cigarettes. She has a 15.00 pack-year smoking history. She has never used smokeless tobacco. She reports that she does not drink alcohol and does not use drugs.   Family History   Her family history includes Heart disease in her father.   Allergies Allergies  Allergen Reactions  . Clorazepate Dipotassium Other (See Comments)    Caused confusion per husband     Home Medications  Prior to  Admission medications   Medication Sig Start Date End Date Taking? Authorizing Provider  zolpidem (AMBIEN) 5 MG tablet Take 5 mg by mouth at bedtime as needed for sleep.   Yes [provider]  albuterol (PROAIR HFA) 108 (90 Base) MCG/ACT inhaler Inhale 2 puffs into the lungs every 6 (six) hours as needed for wheezing or shortness of breath.    [provider]  aspirin EC 81 MG tablet Take 81 mg by mouth every evening.  04/19/15   Deboraha Sprang, MD  carvedilol (COREG) 3.125 MG tablet TAKE 1 TABLET BY MOUTH TWICE DAILY WITH A MEAL Patient taking differently: Take 3.125 mg by mouth in the morning and at bedtime.  10/17/19   Jerline Pain, MD  clorazepate (TRANXENE) 3.75 MG tablet Take 3.75 mg by mouth every morning. 10/15/19   [provider]  cyclobenzaprine (FLEXERIL) 5 MG tablet Take 5 mg by mouth daily as needed for muscle spasms.     [provider]  Ferrous  Sulfate 27 MG TABS Take 27 mg by mouth every evening.    [provider]  folic acid (FOLVITE) 1 MG tablet Take 1 tablet (1 mg total) by mouth daily. 07/13/18   Lavina Hamman, MD  furosemide (LASIX) 80 MG tablet Take 1 tablet (80 mg total) by mouth daily. 07/14/19   Deboraha Sprang, MD  gabapentin (NEURONTIN) 800 MG tablet Take 0.5 tablets (400 mg total) by mouth 3 (three) times daily. For pain Patient taking differently: Take 400 mg by mouth in the morning, at noon, in the evening, and at bedtime.  07/14/18   Domenic Polite, MD  levothyroxine (SYNTHROID) 88 MCG tablet Take 1 tablet (88 mcg total) by mouth daily. 07/07/19   Elayne Snare, MD  mirtazapine (REMERON) 45 MG tablet Take 45 mg by mouth at bedtime. 05/17/18   [provider]  nitroGLYCERIN (NITROSTAT) 0.4 MG SL tablet Place 1 tablet (0.4 mg total) under the tongue every 5 (five) minutes as needed for chest pain. Please make yearly appt with [name] for June. 1st attempt Patient taking differently: Place 0.4 mg under the tongue every  5 (five) minutes as needed for chest pain.  07/04/19   Jerline Pain, MD  OVER THE COUNTER MEDICATION Apply 1 application topically daily as needed (knee pain). Veterinary liniment    [provider]  Oxycodone HCl 10 MG TABS Take 1 tablet (10 mg total) by mouth 2 (two) times daily as needed (pain). Patient taking differently: Take 10 mg by mouth daily as needed (pain).  07/14/18   Domenic Polite, MD  potassium chloride SA (K-DUR) 20 MEQ tablet Take 1 tablet (20 mEq total) by mouth daily. 12/08/18   Jerline Pain, MD  promethazine (PHENERGAN) 25 MG tablet Take 25 mg by mouth every 6 (six) hours as needed for nausea or vomiting.  06/22/18   [provider]  ramipril (ALTACE) 2.5 MG capsule Take 1 capsule (2.5 mg total) by mouth daily. 09/02/19   Jerline Pain, MD  simvastatin (ZOCOR) 10 MG tablet Take 1 tablet by mouth once daily Patient taking differently: Take 10 mg by mouth daily.  12/13/19   Jerline Pain, MD  spironolactone (ALDACTONE) 25 MG tablet Take 1/2 (one-half) tablet by mouth once daily Patient taking differently: Take 12.5 mg by mouth daily.  10/17/19   Jerline Pain, MD  venlafaxine XR (EFFEXOR-XR) 150 MG 24 hr capsule Take 300 mg by mouth daily with breakfast.  09/25/14   [provider]     Critical care time:    The patient is critically ill with multiple organ system failure and requires high complexity decision making for assessment and support, frequent evaluation and titration of therapies, advanced monitoring, review of radiographic studies and interpretation of complex data.   Critical Care Time devoted to patient care services, exclusive of separately billable procedures, described in this note is 45 minutes.   Marshell Garfinkel MD [facility] Please see [email or web address] for pager details.  01/27/2020, 5:09 PM

## 2020-01-14 NOTE — ED Provider Notes (Signed)
[facility] DEPT Provider Note   CSN: [id number] Arrival date & time: 01/20/2020  1120     History Chief Complaint  Patient presents with  . Shortness of Breath    [name] is a 74 y.o. female.  Pt with hx of coronary artery disease status post stenting, mixed congestive heart failure with EF of 20 to 25% in 03/2019, on Lasix 80mg  daily states compliance, COPD who was recently hospitalized within the last month for CHF exacerbation who presents today with EMS for respiratory distress. Patient comes from home because home health went out to see her for the 1st time today and she appeared to not be breathing well, confused and covered in her own stool. Family present per paramedics were not helpful and could not give any additional information. Patient was given albuterol, Atrovent and Solu-Medrol in route due to diffuse wheezing. She was satting 84% on room air when paramedics arrived. Patient has not been able to give much history here  The history is provided by the patient and the EMS personnel. The history is limited by the condition of the patient.  Shortness of Breath      Past Medical History:  Diagnosis Date  . Anemia   . CAD (coronary artery disease)    Prior LAD stenting with stent thrombosis occurring in a withdrawal phase of her Plavix undertaken because of trigeminal neuralgia and release surgery. It was treated medically.  . Cancer ([place])    small bladder cancer removed no  treatment  . Cataract   . CHF (congestive heart failure) ([place])   . Complication of anesthesia    massive heart attack  . GERD (gastroesophageal reflux disease)   . Hyperlipidemia   . Ischemic cardiomyopathy   . Osteoarthritis   . SOB (shortness of breath)     Patient Active Problem List   Diagnosis Date Noted  . Acute respiratory failure with hypoxia ([place]) 07/10/2018  . CHF exacerbation ([place]) 07/10/2018  . Acute encephalopathy 07/10/2018  . Elevated  LFTs 07/10/2018  . Biventricular ICD (implantable cardioverter-defibrillator) in place 10/12/2013  . Essential hypertension 10/12/2013  . Lumbar post-laminectomy syndrome 05/23/2013  . 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 Excessive sleepiness 05/21/2012  . COPD (chronic obstructive pulmonary disease) ([place]) 05/21/2012  . Osteoarthritis of both knees 08/08/2011  . Anemia 03/21/2011  . Trigeminal neuralgia 03/21/2011  . Hyperlipidemia 03/21/2011  . CAD (coronary artery disease)   . Ischemic cardiomyopathy   . Biventricular implantable cardioverter-defibrillator in situ   . Chronic systolic heart failure [facility])     Past Surgical History:  Procedure Laterality Date  . APPENDECTOMY    . BACK SURGERY     x2 back surgeries  . BI-VENTRICULAR IMPLANTABLE CARDIOVERTER DEFIBRILLATOR  (CRT-D)  2009; 05/2013   STJ CRTD generator change by Dr Caryl Comes 05/2013  . BIV PACEMAKER GENERATOR CHANGE OUT Bilateral 05/11/2013   Procedure: BIV PACEMAKER GENERATOR CHANGE OUT;  Surgeon: Deboraha Sprang, MD;  Location: [facility] CATH LAB;  Service: Cardiovascular;  Laterality: Bilateral;  . BRAIN SURGERY     x3  . EYE SURGERY Right 05/24/12  . JOINT REPLACEMENT     thumbs and knees replacement  . TUBAL LIGATION       OB History   No obstetric history on file.     Family History  Problem Relation Age of Onset  . Heart disease Father     Social History   Tobacco Use  . Smoking status: Current Some Day Smoker  Packs/day: 0.50    Years: 30.00    Pack years: 15.00    Types: Cigarettes  . Smokeless tobacco: Never Used  Substance Use Topics  . Alcohol use: No  . Drug use: No    Home Medications Prior to Admission medications   Medication Sig Start Date End Date Taking? Authorizing Provider  acetaminophen (TYLENOL) 500 MG tablet Take 1,000 mg by mouth every 6 (six) hours as needed for headache (pain).    [provider]  albuterol (PROAIR HFA) 108 (90 Base) MCG/ACT inhaler Inhale 2 puffs  into the lungs every 6 (six) hours as needed for wheezing or shortness of breath.    [provider]  aspirin EC 81 MG tablet Take 81 mg by mouth every evening.  04/19/15   Deboraha Sprang, MD  carbamazepine (TEGRETOL) 100 MG chewable tablet Chew 100 mg by mouth 3 (three) times daily.  10/20/15   [provider]  carvedilol (COREG) 3.125 MG tablet TAKE 1 TABLET BY MOUTH TWICE DAILY WITH A MEAL 10/17/19   Jerline Pain, MD  Ferrous Sulfate 27 MG TABS Take 27 mg by mouth every evening.    [provider]  folic acid (FOLVITE) 1 MG tablet Take 1 tablet (1 mg total) by mouth daily. 07/13/18   Lavina Hamman, MD  furosemide (LASIX) 80 MG tablet Take 1 tablet (80 mg total) by mouth daily. 07/14/19   Deboraha Sprang, MD  gabapentin (NEURONTIN) 800 MG tablet Take 0.5 tablets (400 mg total) by mouth 3 (three) times daily. For pain Patient taking differently: Take 800 mg by mouth 3 (three) times daily. For pain 07/14/18   Domenic Polite, MD  levothyroxine (SYNTHROID) 88 MCG tablet Take 1 tablet (88 mcg total) by mouth daily. 07/07/19   Elayne Snare, MD  mirtazapine (REMERON) 45 MG tablet Take 45 mg by mouth at bedtime. 05/17/18   [provider]  nitroGLYCERIN (NITROSTAT) 0.4 MG SL tablet Place 1 tablet (0.4 mg total) under the tongue every 5 (five) minutes as needed for chest pain. Please make yearly appt with [name] for June. 1st attempt 07/04/19   Jerline Pain, MD  OVER THE COUNTER MEDICATION Apply 1 application topically daily as needed (knee pain). Veterinary liniment    [provider]  Oxycodone HCl 10 MG TABS Take 1 tablet (10 mg total) by mouth 2 (two) times daily as needed (pain). 07/14/18   Domenic Polite, MD  potassium chloride SA (K-DUR) 20 MEQ tablet Take 1 tablet (20 mEq total) by mouth daily. 12/08/18   Jerline Pain, MD  promethazine (PHENERGAN) 25 MG tablet Take 25 mg by mouth every 6 (six) hours as needed for nausea or vomiting.  06/22/18   [provider]  ramipril (ALTACE) 2.5 MG capsule Take 1 capsule (2.5 mg total) by mouth daily. 09/02/19   Jerline Pain, MD  simvastatin (ZOCOR) 10 MG tablet Take 1 tablet by mouth once daily 12/13/19   Jerline Pain, MD  spironolactone (ALDACTONE) 25 MG tablet Take 1/2 (one-half) tablet by mouth once daily 10/17/19   Jerline Pain, MD  venlafaxine XR (EFFEXOR-XR) 150 MG 24 hr capsule Take 300 mg by mouth daily with breakfast.  09/25/14   [provider]    Allergies    Clorazepate dipotassium  Review of Systems   Review of Systems  Unable to perform ROS: Mental status change  Respiratory: Positive for shortness of breath.     Physical Exam  Updated Vital Signs BP 138/80   Pulse 84   Resp 19   LMP  (LMP Unknown)   SpO2 99%   Physical Exam Vitals and nursing note reviewed.  Constitutional:      General: She is in acute distress.     Appearance: She is well-developed and normal weight.     Comments: Unkept covered in stool  HENT:     Head: Normocephalic and atraumatic.     Mouth/Throat:     Mouth: Mucous membranes are dry.  Eyes:     Pupils: Pupils are equal, round, and reactive to light.  Cardiovascular:     Rate and Rhythm: Normal rate and regular rhythm.     Pulses: Normal pulses.     Heart sounds: Normal heart sounds. No murmur heard.  No friction rub.  Pulmonary:     Effort: Tachypnea and accessory muscle usage present.     Breath sounds: Wheezing present. No rales.  Abdominal:     General: Bowel sounds are normal. There is no distension.     Palpations: Abdomen is soft.     Tenderness: There is no abdominal tenderness. There is no guarding or rebound.  Musculoskeletal:        General: No tenderness. Normal range of motion.     Cervical back: Neck supple.     Right lower leg: No edema.     Left lower leg: No edema.     Comments: No edema  Skin:    General: Skin is warm and dry.     Findings: No rash.  Neurological:     Mental Status: She is alert.      Cranial Nerves: No cranial nerve deficit.     Comments: Oriented to self  Psychiatric:     Comments: Agitated and intermittently pulling things off     ED Results / Procedures / Treatments   Labs (all labs ordered are listed, but only abnormal results are displayed) Labs Reviewed  RESPIRATORY PANEL BY RT PCR (FLU A&B, COVID) - Abnormal; Notable for the following components:      Result Value   SARS Coronavirus 2 by RT PCR POSITIVE (*)    All other components within normal limits  BLOOD GAS, VENOUS - Abnormal; Notable for the following components:   pO2, Ven <31.0 (*)    Acid-base deficit 3.4 (*)    All other components within normal limits  BRAIN NATRIURETIC PEPTIDE - Abnormal; Notable for the following components:   B Natriuretic Peptide 2,280.8 (*)    All other components within normal limits  COMPREHENSIVE METABOLIC PANEL - Abnormal; Notable for the following components:   Chloride 95 (*)    CO2 16 (*)    Glucose, Bld 119 (*)    BUN 25 (*)    Creatinine, Ser 1.57 (*)    AST 102 (*)    ALT 59 (*)    Alkaline Phosphatase 148 (*)    Total Bilirubin 2.2 (*)    GFR, Estimated 32 (*)    Anion gap 28 (*)    All other components within normal limits  CULTURE, BLOOD (ROUTINE X 2)  CULTURE, BLOOD (ROUTINE X 2)  CBC WITH DIFFERENTIAL/PLATELET  LACTIC ACID, PLASMA  D-DIMER, QUANTITATIVE (NOT AT [facility])  PROCALCITONIN  LACTATE DEHYDROGENASE  FERRITIN  TRIGLYCERIDES  FIBRINOGEN  C-REACTIVE PROTEIN    EKG None  Radiology DG Chest Port 1 View  Result Date: 12/30/2019 CLINICAL DATA:  Shortness of breath EXAM: PORTABLE CHEST 1 VIEW COMPARISON:  January 01, 2020 FINDINGS: There is extensive airspace opacity in both mid and lower lung regions, more on the right than on the left. More subtle opacity noted in the right upper lobe. There is cardiomegaly with central pulmonary vascular prominence and peripheral tapering consistent with pulmonary arterial hypertension. Pacemaker leads  attached to right atrium, right ventricle, and coronary sinus. No adenopathy appreciable by radiography. No bone lesions. There is aortic atherosclerosis. IMPRESSION: Cardiomegaly with pulmonary arterial hypertension. Question a degree of cor pulmonale. Widespread airspace opacity bilaterally, likely representing multifocal pneumonia. Aspiration could present similarly. Both aspiration and pneumonia may present concurrently. Aortic Atherosclerosis (ICD10-I70.0). Electronically Signed   By: Lowella Grip III M.D.   On: 12/31/2019 12:22    Procedures Procedures (including critical care time)  Medications Ordered in ED Medications  magnesium sulfate IVPB 2 g 50 mL (has no administration in time range)  albuterol (PROVENTIL,VENTOLIN) solution continuous neb (has no administration in time range)    ED Course  I have reviewed the triage vital signs and the nursing notes.  Pertinent labs & imaging results that were available during my care of the patient were reviewed by me and considered in my medical decision making (see chart for details).    MDM Rules/Calculators/A&P                          Patient is a 74y/o  acute respiratory distress with respiratory failure with hypoxia. Patient does not wear oxygen at home. She is diffusely wheezing. Patient was recently hospitalized for CHF exacerbation and was diuresed and sent home. At that time she was Covid negative approximately 16 days ago. Patient on exam today does not have significant signs of fluid overload with no notable swelling in the lower extremities. She is wheezing diffusely and tachypneic and concern for COPD exacerbation versus pneumonia versus COVID. Patient's VBG is reassuring with no evidence of CO2 retention at this time. Patient received albuterol, Atrovent and Solu-Medrol in route by paramedics. She is currently on an hour-long continuous neb and as long as she keeps the mask on her face oxygen saturations are greater than 90%.    Chest x-ray shows cardiomegaly with pulmonary artery hypertension and widespread airspace opacity bilaterally likely representing multifocal pneumonia. Will cover with vanc/cefepime given extent of pna. Rectal temp is within normal limits.     1:55 PM Pt tested positive for COVID which would explain CXR results.  Pt wheezing somewhat improved with nebs.  Pt will try to place on 6L NS. VBG wnl.  CMP with mild AKI with cr of 1.57.  Lactate, BNP, COVID labs and CBC pending.  However patient's work of breathing is worsening but oxygen saturation on nonrebreather is the same.  She is having change in her mental status which is most likely metabolic encephalopathy.  We will try high flow nasal cannula for work of breathing.  Will discuss with ICU.  MDM Number of Diagnoses or Management Options   Amount and/or Complexity of Data Reviewed Clinical lab tests: ordered and reviewed Tests in the radiology section of CPT: ordered and reviewed Tests in the medicine section of CPT: ordered and reviewed Decide to obtain previous medical records or to obtain history from someone other than the patient: yes Obtain history from someone other than the patient: yes Review and summarize past medical records: yes Discuss the patient with other providers: yes Independent visualization of images, tracings, or specimens: yes  Risk of Complications, Morbidity, and/or  Mortality Presenting problems: high Diagnostic procedures: high Management options: high  Patient Progress Patient progress: (critical)  CRITICAL CARE Performed by: Tallyn Holroyd Total critical care time: 40 minutes Critical care time was exclusive of separately billable procedures and treating other patients. Critical care was necessary to treat or prevent imminent or life-threatening deterioration. Critical care was time spent personally by me on the following activities: development of treatment plan with patient and/or surrogate as well  as nursing, discussions with consultants, evaluation of patient's response to treatment, examination of patient, obtaining history from patient or surrogate, ordering and performing treatments and interventions, ordering and review of laboratory studies, ordering and review of radiographic studies, pulse oximetry and re-evaluation of patient's condition.   Final Clinical Impression(s) / ED Diagnoses Final diagnoses:  Pneumonia due to [id number] virus  Metabolic encephalopathy  Acute respiratory failure with hypoxia [facility])    Rx / DC Orders ED Discharge Orders    None       Blanchie Dessert, MD 01/11/2020 1513

## 2020-01-14 NOTE — Progress Notes (Signed)
A consult was received from an ED physician for vancomycin per pharmacy dosing.  The patient's profile has been reviewed for ht/wt/allergies/indication/available labs.   A one time order has been placed for vancomycin 1g.  Further antibiotics/pharmacy consults should be ordered by admitting physician if indicated.                       Thank you, Peggyann Juba, PharmD, BCPS 01/01/2020  1:06 PM

## 2020-01-14 NOTE — ED Notes (Signed)
This RN walked into patients room to note her high flow oxygen was off and her sats were down into the 70s with good waveform. She was put back on the high flow [place] with a NRB over top of the high flow. RT was called. RT recommended leaving NRB on top of high flow oxygen. Sats are now at 98% with a good waveform noted.

## 2020-01-15 DIAGNOSIS — J80 Acute respiratory distress syndrome: Secondary | ICD-10-CM | POA: Diagnosis not present

## 2020-01-15 DIAGNOSIS — U071 COVID-19: Secondary | ICD-10-CM | POA: Diagnosis not present

## 2020-01-15 LAB — GLUCOSE, CAPILLARY
Glucose-Capillary: 104 mg/dL — ABNORMAL HIGH (ref 70–99)
Glucose-Capillary: 107 mg/dL — ABNORMAL HIGH (ref 70–99)
Glucose-Capillary: 111 mg/dL — ABNORMAL HIGH (ref 70–99)
Glucose-Capillary: 111 mg/dL — ABNORMAL HIGH (ref 70–99)
Glucose-Capillary: 122 mg/dL — ABNORMAL HIGH (ref 70–99)
Glucose-Capillary: 140 mg/dL — ABNORMAL HIGH (ref 70–99)

## 2020-01-15 LAB — D-DIMER, QUANTITATIVE: D-Dimer, Quant: 1.62 ug/mL-FEU — ABNORMAL HIGH (ref 0.00–0.50)

## 2020-01-15 LAB — FERRITIN: Ferritin: 737 ng/mL — ABNORMAL HIGH (ref 11–307)

## 2020-01-15 LAB — COMPREHENSIVE METABOLIC PANEL
ALT: 59 U/L — ABNORMAL HIGH (ref 0–44)
ALT: 66 U/L — ABNORMAL HIGH (ref 0–44)
AST: 102 U/L — ABNORMAL HIGH (ref 15–41)
AST: 105 U/L — ABNORMAL HIGH (ref 15–41)
Albumin: 3.6 g/dL (ref 3.5–5.0)
Albumin: 3.4 g/dL — ABNORMAL LOW (ref 3.5–5.0)
Alkaline Phosphatase: 102 U/L (ref 38–126)
Alkaline Phosphatase: 102 U/L (ref 38–126)
Anion gap: 15 (ref 5–15)
Anion gap: 16 — ABNORMAL HIGH (ref 5–15)
BUN: 27 mg/dL — ABNORMAL HIGH (ref 8–23)
BUN: 30 mg/dL — ABNORMAL HIGH (ref 8–23)
CO2: 24 mmol/L (ref 22–32)
CO2: 24 mmol/L (ref 22–32)
Calcium: 8.5 mg/dL — ABNORMAL LOW (ref 8.9–10.3)
Calcium: 8.6 mg/dL — ABNORMAL LOW (ref 8.9–10.3)
Chloride: 100 mmol/L (ref 98–111)
Chloride: 97 mmol/L — ABNORMAL LOW (ref 98–111)
Creatinine, Ser: 1.25 mg/dL — ABNORMAL HIGH (ref 0.44–1.00)
Creatinine, Ser: 1.31 mg/dL — ABNORMAL HIGH (ref 0.44–1.00)
GFR, Estimated: 42 mL/min — ABNORMAL LOW (ref >60)
GFR, Estimated: 40 mL/min — ABNORMAL LOW (ref >60)
Glucose, Bld: 130 mg/dL — ABNORMAL HIGH (ref 70–99)
Glucose, Bld: 105 mg/dL — ABNORMAL HIGH (ref 70–99)
Potassium: 3.9 mmol/L (ref 3.5–5.1)
Potassium: 3.9 mmol/L (ref 3.5–5.1)
Sodium: 139 mmol/L (ref 135–145)
Sodium: 137 mmol/L (ref 135–145)
Total Bilirubin: 1.4 mg/dL — ABNORMAL HIGH (ref 0.3–1.2)
Total Bilirubin: 1.1 mg/dL (ref 0.3–1.2)
Total Protein: 6.7 g/dL (ref 6.5–8.1)
Total Protein: 6.6 g/dL (ref 6.5–8.1)

## 2020-01-15 LAB — LACTIC ACID, PLASMA: Lactic Acid, Venous: 2.1 mmol/L (ref 0.5–1.9)

## 2020-01-15 LAB — C-REACTIVE PROTEIN: CRP: 7.4 mg/dL — ABNORMAL HIGH (ref <1.0)

## 2020-01-15 MED ORDER — SODIUM CHLORIDE 0.9 % IV SOLN
2.0000 g | Freq: Two times a day (BID) | INTRAVENOUS | Status: AC
  Administered 2020-01-15 – 2020-01-19 (×9): 2 g via INTRAVENOUS
  Filled 2020-01-15 (×10): qty 2

## 2020-01-15 MED ORDER — IPRATROPIUM-ALBUTEROL 0.5-2.5 (3) MG/3ML IN SOLN
3.0000 mL | Freq: Four times a day (QID) | RESPIRATORY_TRACT | Status: DC
  Administered 2020-01-15 – 2020-01-16 (×3): 3 mL via RESPIRATORY_TRACT
  Filled 2020-01-15 (×4): qty 3

## 2020-01-15 MED ORDER — TOCILIZUMAB 400 MG/20ML IV SOLN
600.0000 mg | Freq: Once | INTRAVENOUS | Status: AC
  Administered 2020-01-15: 600 mg via INTRAVENOUS
  Filled 2020-01-15: qty 10

## 2020-01-15 MED ORDER — ENOXAPARIN SODIUM 40 MG/0.4ML ~~LOC~~ SOLN
40.0000 mg | SUBCUTANEOUS | Status: DC
  Administered 2020-01-15 – 2020-01-18 (×4): 40 mg via SUBCUTANEOUS
  Filled 2020-01-15 (×4): qty 0.4

## 2020-01-15 NOTE — Progress Notes (Signed)
CRITICAL VALUE ALERT  Critical Value:  Lactic Acid 2.1  Date & Time Notied:  01/15/20 1303  Provider Notified: Vaughan Browner, MD   Orders Received/Actions taken: Awaiting new orders

## 2020-01-15 NOTE — Progress Notes (Addendum)
NAME:  [name], MRN:  [id number], DOB:  1945-12-01, LOS: 1 ADMISSION DATE:  01/19/2020, CONSULTATION DATE: 01/21/2020 REFERRING MD: Blanchie Dessert, MD, CHIEF COMPLAINT: Acute respiratory failure, COVID-19 pneumonia  Brief History   74 year old unvaccinated female with significant past medical history of coronary artery disease, CHF with EF of 20-25%, COPD with recent ED visit for CHF exacerbation presenting with acute respiratory failure, altered mental status, elevated lactic acid and sepsis in the setting of COVID-19 infection.  Past Medical History    has a past medical history of Anemia, CAD (coronary artery disease), Cancer ([place]), Cataract, CHF (congestive heart failure) ([place]), Complication of anesthesia, GERD (gastroesophageal reflux disease), Hyperlipidemia, Ischemic cardiomyopathy, Osteoarthritis, and SOB (shortness of breath).  Significant Hospital Events   10/16 admit  Consults:  PCCM  Procedures:    Significant Diagnostic Tests:  Chest x-ray 10/16-cardiomegaly, widespread bilateral airspace disease.  Micro Data:  Blood cultures  Antimicrobials:  Vanco 10/16 Cefepime 10/16  Interim history/subjective:   Remains confused Off NRB and on salter [place]  Objective   Blood pressure (!) 102/46, pulse 71, temperature 97.9 F (36.6 C), temperature source Axillary, resp. rate 17, height 5\' 3"  (1.6 m), weight 65.8 kg, SpO2 97 %.    FiO2 (%):  [30 %-40 %] 30 %   Intake/Output Summary (Last 24 hours) at 01/15/2020 0925 Last data filed at 01/15/2020 0400 Gross per 24 hour  Intake 1642.24 ml  Output 350 ml  Net 1292.24 ml   Filed Weights   01/01/2020 1314 01/15/20 0423  Weight: 59 kg 65.8 kg    Examination: Gen:      No acute distress, chronically ill-appearing HEENT:  EOMI, sclera anicteric Neck:     No masses; no thyromegaly Lungs:    Clear to auscultation bilaterally; normal respiratory effort CV:         Regular rate and rhythm; no murmurs Abd:      +  bowel sounds; soft, non-tender; no palpable masses, no distension Ext:    No edema; adequate peripheral perfusion Skin:      Warm and dry; no rash Neuro: Somnolent, confused  Labs/imaging reviewed Significant for BUN/creatinine 27/1.25, CRP 7.4 WBC 8.0  Resolved Hospital Problem list     Assessment & Plan:  Severe COVID-19 pneumonia Continue high flow nasal cannula Starting remdesivir, steroids. Actemra Continue antibiotic coverage.  Stop vancomycin as MRSA PCR is negative.  Follow cultures Follow cultures Nebs for wheezing  Severe sepsis with elevated lactic acid Gentle IV fluids Antibiotics as above Follow lactic acid  Coronary artery disease, cardiomyopathy with EF 20-25% Judicious use of fluids  Severe malnutrition Nutrition consult  DM Hold levemir as she is NPO SSI coverage  Goals of care Very poor prognosis and she may not survive this hospitalization Discussed with daughter and husband  [name] apparently not been taking care of herself or taking her medications at home They are aware of how sick she is.  They confirmed DNR status. Will be okay with comfort care if she deteriorates  Best practice:  Diet: NPO Pain/Anxiety/Delirium protocol (if indicated): NA VAP protocol (if indicated):NA DVT prophylaxis: Lovenox GI prophylaxis: pepcid Glucose control: SSI Mobility: Bed Code Status: DNR Family Communication: See above Disposition: ICU  Critical care time:    The patient is critically ill with multiple organ system failure and requires high complexity decision making for assessment and support, frequent evaluation and titration of therapies, advanced monitoring, review of radiographic studies and interpretation of complex data.   Critical  Care Time devoted to patient care services, exclusive of separately billable procedures, described in this note is 45 minutes.   Marshell Garfinkel MD [facility] Please see [email or web address] for pager  details.  01/15/2020, 9:25 AM

## 2020-01-15 NOTE — Progress Notes (Signed)
Pharmacy Antibiotic Note  [name] is a 74 y.o. female with recent ED visit for CHF admitted on 01/06/2020 with acute respiratory failure due to COVID PNA and possible HCAP.  Pharmacy has been consulted for vancomycin and cefepime dosing.  Today, 01/15/2020: - day #2 vancomycin and cefepime - Tmax 99.5, wnc 8 on 10/16 - scr trending down (1.25, crcl~35) - MRSA pcr neg  Plan: - adjust Cefepime to 2 g IV q12h for renal function - Vancomycin 1000 mg IV q 24 hours. MRSA PCR is neg. Consider d/c vanomycin  ____________________________________________  Height: 5\' 3"  (160 cm) Weight: 65.8 kg (145 lb 1 oz) IBW/kg (Calculated) : 52.4  Temp (24hrs), Avg:98.7 F (37.1 C), Min:98.1 F (36.7 C), Max:99.5 F (37.5 C)  Recent Labs  Lab 01/27/2020 1138 01/11/2020 1414 01/23/2020 1635 01/15/20 0255  WBC  --  8.0  --   --   CREATININE 1.57*  --  1.40* 1.25*  LATICACIDVEN  --  7.9*  --   --     Estimated Creatinine Clearance: 35.5 mL/min (A) (by C-G formula based on SCr of 1.25 mg/dL (H)).    Allergies  Allergen Reactions  . Clorazepate Dipotassium Other (See Comments)    Caused confusion per husband    Antimicrobials this admission: 10/16 cefepime >>  10/16 vancomycin >>    Microbiology results: 10/16 BCx: sent 10/16 Resp PCR: COVID +, Flu A/B - 10/16 MRSA PCR: neg  Thank you for allowing pharmacy to be a part of this patient's care.  Lynelle Doctor 01/15/2020 8:41 AM

## 2020-01-16 ENCOUNTER — Inpatient Hospital Stay (HOSPITAL_COMMUNITY): Payer: Medicare Other

## 2020-01-16 DIAGNOSIS — J1282 Pneumonia due to coronavirus disease 2019: Secondary | ICD-10-CM | POA: Diagnosis not present

## 2020-01-16 DIAGNOSIS — G9341 Metabolic encephalopathy: Secondary | ICD-10-CM

## 2020-01-16 DIAGNOSIS — U071 COVID-19: Secondary | ICD-10-CM | POA: Diagnosis not present

## 2020-01-16 DIAGNOSIS — J80 Acute respiratory distress syndrome: Secondary | ICD-10-CM | POA: Diagnosis not present

## 2020-01-16 LAB — COMPREHENSIVE METABOLIC PANEL
ALT: 72 U/L — ABNORMAL HIGH (ref 0–44)
AST: 92 U/L — ABNORMAL HIGH (ref 15–41)
Albumin: 3.4 g/dL — ABNORMAL LOW (ref 3.5–5.0)
Alkaline Phosphatase: 101 U/L (ref 38–126)
Anion gap: 16 — ABNORMAL HIGH (ref 5–15)
BUN: 34 mg/dL — ABNORMAL HIGH (ref 8–23)
CO2: 24 mmol/L (ref 22–32)
Calcium: 8.7 mg/dL — ABNORMAL LOW (ref 8.9–10.3)
Chloride: 100 mmol/L (ref 98–111)
Creatinine, Ser: 0.96 mg/dL (ref 0.44–1.00)
GFR, Estimated: 58 mL/min — ABNORMAL LOW (ref >60)
Glucose, Bld: 112 mg/dL — ABNORMAL HIGH (ref 70–99)
Potassium: 3.4 mmol/L — ABNORMAL LOW (ref 3.5–5.1)
Sodium: 140 mmol/L (ref 135–145)
Total Bilirubin: 1.3 mg/dL — ABNORMAL HIGH (ref 0.3–1.2)
Total Protein: 6.7 g/dL (ref 6.5–8.1)

## 2020-01-16 LAB — GLUCOSE, CAPILLARY
Glucose-Capillary: 99 mg/dL (ref 70–99)
Glucose-Capillary: 98 mg/dL (ref 70–99)
Glucose-Capillary: 114 mg/dL — ABNORMAL HIGH (ref 70–99)
Glucose-Capillary: 113 mg/dL — ABNORMAL HIGH (ref 70–99)
Glucose-Capillary: 106 mg/dL — ABNORMAL HIGH (ref 70–99)
Glucose-Capillary: 128 mg/dL — ABNORMAL HIGH (ref 70–99)

## 2020-01-16 LAB — D-DIMER, QUANTITATIVE: D-Dimer, Quant: 1.43 ug/mL-FEU — ABNORMAL HIGH (ref 0.00–0.50)

## 2020-01-16 LAB — CBC
HCT: 36.3 % (ref 36.0–46.0)
Hemoglobin: 11.8 g/dL — ABNORMAL LOW (ref 12.0–15.0)
MCH: 31.1 pg (ref 26.0–34.0)
MCHC: 32.5 g/dL (ref 30.0–36.0)
MCV: 95.5 fL (ref 80.0–100.0)
Platelets: 176 10*3/uL (ref 150–400)
RBC: 3.8 MIL/uL — ABNORMAL LOW (ref 3.87–5.11)
RDW: 17.9 % — ABNORMAL HIGH (ref 11.5–15.5)
WBC: 15 10*3/uL — ABNORMAL HIGH (ref 4.0–10.5)
nRBC: 0 % (ref 0.0–0.2)

## 2020-01-16 LAB — MAGNESIUM: Magnesium: 2.5 mg/dL — ABNORMAL HIGH (ref 1.7–2.4)

## 2020-01-16 LAB — PHOSPHORUS: Phosphorus: 3.5 mg/dL (ref 2.5–4.6)

## 2020-01-16 LAB — C-REACTIVE PROTEIN: CRP: 6.4 mg/dL — ABNORMAL HIGH (ref <1.0)

## 2020-01-16 MED ORDER — FAMOTIDINE 20 MG PO TABS
20.0000 mg | ORAL_TABLET | Freq: Every day | ORAL | Status: DC
  Administered 2020-01-16 – 2020-01-17 (×2): 20 mg via ORAL
  Filled 2020-01-16 (×2): qty 1

## 2020-01-16 MED ORDER — ALBUTEROL SULFATE HFA 108 (90 BASE) MCG/ACT IN AERS: 2.0000 | INHALATION_SPRAY | Freq: Four times a day (QID) | RESPIRATORY_TRACT | Status: DC

## 2020-01-16 MED ORDER — MOMETASONE FURO-FORMOTEROL FUM 100-5 MCG/ACT IN AERO
2.0000 | INHALATION_SPRAY | Freq: Two times a day (BID) | RESPIRATORY_TRACT | Status: DC
  Administered 2020-01-16 – 2020-01-19 (×7): 2 via RESPIRATORY_TRACT
  Filled 2020-01-16: qty 8.8

## 2020-01-16 MED ORDER — BOOST / RESOURCE BREEZE PO LIQD CUSTOM
1.0000 | ORAL | Status: DC
  Administered 2020-01-17: 1 via ORAL

## 2020-01-16 MED ORDER — POTASSIUM CHLORIDE 10 MEQ/100ML IV SOLN
10.0000 meq | INTRAVENOUS | Status: AC
  Administered 2020-01-16 (×4): 10 meq via INTRAVENOUS
  Filled 2020-01-16 (×4): qty 100

## 2020-01-16 MED ORDER — ADULT MULTIVITAMIN W/MINERALS CH
1.0000 | ORAL_TABLET | Freq: Every day | ORAL | Status: DC
  Administered 2020-01-16 – 2020-01-18 (×3): 1 via ORAL
  Filled 2020-01-16 (×3): qty 1

## 2020-01-16 MED ORDER — ENSURE ENLIVE PO LIQD: 237.0000 mL | Freq: Two times a day (BID) | ORAL | Status: DC

## 2020-01-16 NOTE — Evaluation (Signed)
Clinical/Bedside Swallow Evaluation Patient Details  Name: [name] MRN: [id number] Date of Birth: 1945/10/03  Today's Date: 01/16/2020 Time: SLP Start Time (ACUTE ONLY): 10 SLP Stop Time (ACUTE ONLY): 0945 SLP Time Calculation (min) (ACUTE ONLY): 25 min  Past Medical History:  Past Medical History:  Diagnosis Date  . Anemia   . CAD (coronary artery disease)    Prior LAD stenting with stent thrombosis occurring in a withdrawal phase of her Plavix undertaken because of trigeminal neuralgia and release surgery. It was treated medically.  . Cancer ([place])    small bladder cancer removed no  treatment  . Cataract   . CHF (congestive heart failure) ([place])   . Complication of anesthesia    massive heart attack  . GERD (gastroesophageal reflux disease)   . Hyperlipidemia   . Ischemic cardiomyopathy   . Osteoarthritis   . SOB (shortness of breath)    Past Surgical History:  Past Surgical History:  Procedure Laterality Date  . APPENDECTOMY    . BACK SURGERY     x2 back surgeries  . BI-VENTRICULAR IMPLANTABLE CARDIOVERTER DEFIBRILLATOR  (CRT-D)  2009; 05/2013   STJ CRTD generator change by Dr Caryl Comes 05/2013  . BIV PACEMAKER GENERATOR CHANGE OUT Bilateral 05/11/2013   Procedure: BIV PACEMAKER GENERATOR CHANGE OUT;  Surgeon: Deboraha Sprang, MD;  Location: [facility] CATH LAB;  Service: Cardiovascular;  Laterality: Bilateral;  . BRAIN SURGERY     x3  . EYE SURGERY Right 05/24/12  . JOINT REPLACEMENT     thumbs and knees replacement  . TUBAL LIGATION     HPI:  74 yo female adm to [facility] with cough, congestion - diagnosed with COVID.  Pt went to hospital ED approx 2 weeks ago for SOB due to CHF exacerbation.  PMH + for smoking, CAD, CHF, COPD, anemia, cataract, cancer, GERD, HLD, OEA.  Pt was initially placed on 15 liters HFNC but is now down to 5 Liters.  CXR showed bilateral basilar and perihilar infiltrates.  Swallow evaluation ordered.  Pt denies dysphagia.   Assessment / Plan /  Recommendation Clinical Impression  Patient presents with mild oral *? cognitive based dysphagia due to encephalopathy, ? Brain surgery?* characterized by clinical indication of delayed swallow initiation.  Pt was not very interactive and only answered "yes mam" to most questions.  No indication of aspiration noted but pt demonstrates poor oral manipulation with solids and suspect she swallow cracker bolus nearly whole.  She is edentulous and uses dentures to eat but SLP did not locate any in the room.  Phoned her sister Edwena Felty after could not reach her spouse or daughter.  Gaye advised she will ask daughter to bring them to the hospital in a bag with pt's name and room number.  At this time, recommend dys1/thin diet.  SlP will follow up next date to assure po tolerance and readiness for dietary advancement.  Wrote swallow precaution signs and informed RN and pt of recommendations. SLP Visit Diagnosis: Dysphagia, oral phase (R13.11)    Aspiration Risk  Moderate aspiration risk    Diet Recommendation Dysphagia 1 (Puree);Thin liquid   Liquid Administration via: Cup;Straw Medication Administration: Whole meds with puree Supervision: Staff to assist with self feeding Compensations: Slow rate;Small sips/bites Postural Changes: Seated upright at 90 degrees;Remain upright for at least 30 minutes after po intake    Other  Recommendations Oral Care Recommendations: Oral care BID   Follow up Recommendations        Frequency and  Duration min 1 x/week  1 week       Prognosis Prognosis for Safe Diet Advancement: Good Barriers to Reach Goals: Motivation      Swallow Study   General Date of Onset: 01/16/20 HPI: 74 yo female adm to [facility] with cough, congestion - diagnosed with COVID.  Pt went to hospital ED approx 2 weeks ago for SOB due to CHF exacerbation.  PMH + for smoking, CAD, CHF, COPD, anemia, cataract, cancer, GERD, HLD, OEA.  Pt was initially placed on 15 liters HFNC but is now down to  5 Liters.  CXR showed bilateral basilar and perihilar infiltrates.  Swallow evaluation ordered.  Pt denies dysphagia. Type of Study: Bedside Swallow Evaluation Diet Prior to this Study: NPO Temperature Spikes Noted: No Respiratory Status: Nasal cannula History of Recent Intubation: No Behavior/Cognition: Alert;Distractible;Requires cueing (pt does not interact except to answer "yes mam" mostly) Oral Cavity - Dentition: Edentulous Self-Feeding Abilities: Needs assist (right handed - held own cup) Patient Positioning: Other (comment) (upright side lying, pt did not follow directions to lay on her back) Baseline Vocal Quality: Normal Volitional Cough: Strong Volitional Swallow: Unable to elicit    Oral/Motor/Sensory Function Overall Oral Motor/Sensory Function: Within functional limits   Ice Chips Ice chips: Not tested   Thin Liquid Thin Liquid: Within functional limits Presentation: Straw    Nectar Thick Nectar Thick Liquid: Not tested   Honey Thick Honey Thick Liquid: Not tested   Puree Puree: Impaired Presentation: Spoon Pharyngeal Phase Impairments: Suspected delayed Swallow   Solid     Solid: Impaired Presentation: Self Fed Oral Phase Impairments: Reduced lingual movement/coordination;Impaired mastication Oral Phase Functional Implications: Impaired mastication Other Comments: pt with poor mastication ability and appeared to essentially swallow cracker bolus whole, she reports she uses dentures to eat      Macario Golds 01/16/2020,10:07 AM    Kathleen Lime, MS [place] Office [phone] Pager [phone]

## 2020-01-16 NOTE — Progress Notes (Signed)
Initial Nutrition Assessment  DOCUMENTATION CODES:   Not applicable  INTERVENTION:  - will order Ensure Enlive BID, each supplement provides 350 kcal and 20 grams of protein. - will order Boost Breeze once/day, each supplement provides 250 kcal and 9 grams of protein. - will order Magic Cup BID with meals, each supplement provides 290 kcal and 9 grams of protein. - will order 1 tablet multivitamin with minerals. - will complete NFPE when feasible.  NUTRITION DIAGNOSIS:   Increased nutrient needs related to acute illness, catabolic illness ([id number] infection) as evidenced by estimated needs.  GOAL:   Patient will meet greater than or equal to 90% of their needs  MONITOR:   PO intake, Supplement acceptance, Labs, Weight trends  REASON FOR ASSESSMENT:   Consult Assessment of nutrition requirement/status  ASSESSMENT:   74 year old unvaccinated female with medical history of CAD, CHF, GERD, HLD, osteoarthritis, ischemic cardiomyopathy, cancer, and COPD. She presented to the ED with respiratory failure, encephalopathy, elevated lactic acid, and sepsis in the setting of COVID-19 infection.  Diet advanced from NPO to Dysphagia 1, thin liquids at 0945. Patient noted to be a/o to self and place. Spoke with RN on 2W who reports patient has not received breakfast and they are preparing to move patient to 5W so it is unlikely that she will receive a meal prior to lunch.   Weight today is 144 lb and weight yesterday was 145 lb. Weight on 9/17 was 140 lb. Weight on 4/30 was 150 lb. No information documented in the edema section of flow sheet yet.  Per notes: - severe sepsis - severe malnutrition - acute metabolic encephalopathy - COPD exacerbation - acute hypoxic respiratory failure due to COVID-19   Labs reviewed; CBGs: 98 and 114 mg/dl, K: 3.4 mmol/l, BUN: 34 mg/dl, Ca: 8.7 mg/dl, Mg: 2.5 mg/dl, LFTs elevated, GFR: 58 ml/min.  Medications reviewed; 20 mg IV pepcid/day, sliding  scale novolog, solu-medrol and prednisone, 100 mg IV remdesivir x1 dose/day x4 days (10/17-10/20), 800 mg actemra x1 dose 10/17.    NUTRITION - FOCUSED PHYSICAL EXAM:  unable to complete as patient was being transferred to another floor.  Diet Order:   Diet Order            DIET - DYS 1 Room service appropriate? Yes; Fluid consistency: Thin  Diet effective now                 EDUCATION NEEDS:   No education needs have been identified at this time  Skin:  Skin Assessment: Skin Integrity Issues: Skin Integrity Issues:: Other (Comment) Other: MASD to rectum  Last BM:  10/18 (type 7)  Height:   Ht Readings from Last 1 Encounters:  01/11/2020 5\' 3"  (1.6 m)    Weight:   Wt Readings from Last 1 Encounters:  01/16/20 65.3 kg    Estimated Nutritional Needs:  Kcal:  1640-1830 kcal Protein:  78-90 grams (1.2-1.4 grams/kg) Fluid:  >/= 1.8 L/day      Jarome Matin, MS, RD, LDN, CNSC Inpatient Clinical Dietitian RD pager # available in AMION  After hours/weekend pager # available in [place]

## 2020-01-16 NOTE — Progress Notes (Signed)
Patient had poor po intake today. She was offered other beverages and soft foods to try and get her to eat. Patient refused all po intake. She stated she is not hungry. Will try to offer dinner and snacks again and will continue to monitor.

## 2020-01-16 NOTE — Progress Notes (Addendum)
PROGRESS NOTE  [name] JME:[id number] DOB: 1945/05/04 DOA: 01/13/2020 PCP: Shirline Frees, MD   LOS: 2 days   Brief Narrative / Interim history: 74 year old unvaccinated female with history of CAD, chronic systolic CHF with EF 22-97%, COPD, came in with acute respiratory failure, encephalopathy, elevated lactic acid and sepsis in the setting of COVID-19 infection.  Subjective / 24h Interval events: Has mittens on, she has been more confused.  Assessment & Plan:  Principal Problem Acute Hypoxic Respiratory Failure due to Covid-19 Viral Illness -Patient was initially admitted to the ICU requiring heated high flow.  She has been started on remdesivir, steroids, was given Actemra and placed on empiric antibiotics -She seems to be improving, was transferred to the hospitalist service on 10/18.  She is on 5 L this morning, will transfer out of the ICU.  FiO2 (%):  [30 %] 30 %    COVID-19 Labs  Recent Labs    01/08/2020 1414 01/01/2020 1635 01/15/20 0303 01/15/20 0321 01/16/20 0310  DDIMER 1.55*  --  1.62*  --  1.43*  FERRITIN 999*  --  737*  --   --   LDH 574* 678*  --   --   --   CRP 6.8*  --   --  7.4* 6.4*    Lab Results  Component Value Date   SARSCOV2NAA POSITIVE (A) 01/16/2020   [employer] NEGATIVE 12/29/2019   SARSCOV2NAA NOT DETECTED 07/10/2018   Active Problems Severe sepsis, elevated lactic acid -Received IV fluids, she was initially placed on antibiotics with vancomycin and cefepime, currently on cefepime alone since MRSA PCR was negative -Would favor to do 5 days of antibiotics  CAD, cardiomyopathy, EF 98-92%, chronic systolic CHF -Hold further IV fluids, appears euvolemic.  Continue statin and aspirin -Hold Coreg, Lasix, ramipril, spironolactone due to soft blood pressures  Severe malnutrition -Nutrition consult  Essential hypertension -Currently with soft blood pressures, hold Coreg, Lasix, ramipril, spironolactone  Acute metabolic  encephalopathy -Husband denies prior history of dementia or memory problems, possibly in the setting of acute illness, monitor  Hyperlipidemia -Resume simvastatin  COPD with acute exacerbation -On steroids, inhalers, antibiotics  Type 2 diabetes mellitus -Hold Levemir, she is not eating much.  Continue sliding scale  CBG (last 3)  Recent Labs    01/15/20 2355 01/16/20 0459 01/16/20 0813  GLUCAP 107* 98 114*    Scheduled Meds: . Chlorhexidine Gluconate Cloth  6 each Topical Daily  . enoxaparin (LOVENOX) injection  40 mg Subcutaneous Q24H  . insulin aspart  0-9 Units Subcutaneous Q4H  . methylPREDNISolone (SOLU-MEDROL) injection  1 mg/kg Intravenous Q12H   Followed by  . [START ON 01/18/2020] predniSONE  50 mg Oral Q breakfast  . mometasone-formoterol  2 puff Inhalation BID   Continuous Infusions: . ceFEPime (MAXIPIME) IV 2 g (01/16/20 0940)  . famotidine (PEPCID) IV Stopped (01/15/20 2310)  . remdesivir 100 mg in NS 100 mL Stopped (01/15/20 1114)   PRN Meds:.  DVT prophylaxis: Lovenox Code Status: DNR Family Communication: d/w Zophia Marrone, spouse, [phone]   Status is: Inpatient  Remains inpatient appropriate because:Inpatient level of care appropriate due to severity of illness   Dispo: The patient is from: Home              Anticipated d/c is to: Home              Anticipated d/c date is: 3 days              Patient currently  is not medically stable to d/c.  Consultants:  PCCM  Procedures:  None   Microbiology: None   Antibacterials: Vancomycin 10/16-10/17 Cefepime 10/16 >>  Objective: Vitals:   01/16/20 0700 01/16/20 0800 01/16/20 0817 01/16/20 0827  BP: 104/66 106/67    Pulse: 71 71    Resp: (!) 27 (!) 23    Temp:  (!) 97.5 F (36.4 C)    TempSrc:  Axillary    SpO2: 100% 100% 100% 99%  Weight:      Height:        Intake/Output Summary (Last 24 hours) at 01/16/2020 1020 Last data filed at 01/16/2020 0645 Gross per 24 hour    Intake 591.33 ml  Output --  Net 591.33 ml   Filed Weights   01/10/2020 1314 01/15/20 0423 01/16/20 0500  Weight: 59 kg 65.8 kg 65.3 kg    Examination:  Constitutional: In bed, appears comfortable, mittens on Eyes: no scleral icterus ENMT: Mucous membranes are moist.  Neck: normal, supple Respiratory: Diffuse bilateral end expiratory wheezing, tachypneic Cardiovascular: Regular rate and rhythm, no murmurs / rubs / gallops.  No peripheral edema Abdomen: non distended, no tenderness. Bowel sounds positive.  Musculoskeletal: no clubbing / cyanosis.  Skin: no rashes Neurologic: Nonfocal Psychiatric: Alert to self   Data Reviewed: I have independently reviewed following labs and imaging studies   CBC: Recent Labs  Lab 01/23/2020 1414 01/16/20 0310  WBC 8.0 15.0*  NEUTROABS 6.7  --   HGB 11.5* 11.8*  HCT 35.3* 36.3  MCV 97.0 95.5  PLT 143* 595   Basic Metabolic Panel: Recent Labs  Lab 01/02/2020 1138 01/05/2020 1635 01/15/20 0255 01/15/20 1231 01/16/20 0310  NA 139  --  139 137 140  K 4.6  --  3.9 3.9 3.4*  CL 95*  --  100 97* 100  CO2 16*  --  24 24 24   GLUCOSE 119*  --  130* 105* 112*  BUN 25*  --  27* 30* 34*  CREATININE 1.57* 1.40* 1.25* 1.31* 0.96  CALCIUM 9.6  --  8.5* 8.6* 8.7*  MG  --   --   --   --  2.5*  PHOS  --   --   --   --  3.5   GFR: Estimated Creatinine Clearance: 46 mL/min (by C-G formula based on SCr of 0.96 mg/dL). Liver Function Tests: Recent Labs  Lab 01/02/2020 1138 01/15/20 0255 01/15/20 1231 01/16/20 0310  AST 102* 102* 105* 92*  ALT 59* 59* 66* 72*  ALKPHOS 148* 102 102 101  BILITOT 2.2* 1.4* 1.1 1.3*  PROT 8.1 6.7 6.6 6.7  ALBUMIN 4.3 3.6 3.4* 3.4*   No results for input(s): LIPASE, AMYLASE in the last 168 hours. No results for input(s): AMMONIA in the last 168 hours. Coagulation Profile: No results for input(s): INR, PROTIME in the last 168 hours. Cardiac Enzymes: No results for input(s): CKTOTAL, CKMB, CKMBINDEX, TROPONINI  in the last 168 hours. BNP (last 3 results) No results for input(s): PROBNP in the last 8760 hours. HbA1C: No results for input(s): HGBA1C in the last 72 hours. CBG: Recent Labs  Lab 01/15/20 1552 01/15/20 2015 01/15/20 2355 01/16/20 0459 01/16/20 0813  GLUCAP 111* 104* 107* 98 114*   Lipid Profile: Recent Labs    01/21/2020 1414  TRIG 97   Thyroid Function Tests: No results for input(s): TSH, T4TOTAL, FREET4, T3FREE, THYROIDAB in the last 72 hours. Anemia Panel: Recent Labs    01/27/2020 1414 01/15/20 0303  FERRITIN  999* 737*   Urine analysis:    Component Value Date/Time   COLORURINE COLORLESS (A) 07/10/2018 0719   APPEARANCEUR CLEAR 07/10/2018 0719   LABSPEC 1.005 07/10/2018 0719   PHURINE 6.0 07/10/2018 0719   GLUCOSEU NEGATIVE 07/10/2018 0719   HGBUR SMALL (A) 07/10/2018 0719   BILIRUBINUR NEGATIVE 07/10/2018 0719   KETONESUR NEGATIVE 07/10/2018 0719   PROTEINUR NEGATIVE 07/10/2018 0719   UROBILINOGEN 1.0 03/22/2011 2007   NITRITE NEGATIVE 07/10/2018 0719   LEUKOCYTESUR TRACE (A) 07/10/2018 0719   Sepsis Labs: Invalid input(s): PROCALCITONIN, LACTICIDVEN  Recent Results (from the past 240 hour(s))  Respiratory Panel by RT PCR (Flu A&B, Covid) - Nasopharyngeal Swab     Status: Abnormal   Collection Time: 01/15/2020 11:40 AM   Specimen: Nasopharyngeal Swab  Result Value Ref Range Status   SARS Coronavirus 2 by RT PCR POSITIVE (A) NEGATIVE Final    Comment: (NOTE) SARS-CoV-2 target nucleic acids are DETECTED.  SARS-CoV-2 RNA is generally detectable in upper respiratory specimens  during the acute phase of infection. Positive results are indicative of the presence of the identified virus, but do not rule out bacterial infection or co-infection with other pathogens not detected by the test. Clinical correlation with patient history and other diagnostic information is necessary to determine patient infection status. The expected result is Negative.  Fact  Sheet for Patients:  PinkCheek.be  Fact Sheet for Healthcare Providers: GravelBags.it  This test is not yet approved or cleared by the [place] FDA and  has been authorized for detection and/or diagnosis of SARS-CoV-2 by FDA under an Emergency Use Authorization (EUA).  This EUA will remain in effect (meaning this test can be used) for the duration of  the COVID-19 declaration under Section 564(b)(1) of the Act , 21 U.S.C. section 360bbb-3(b)(1), unless the authorization is terminated or revoked sooner.      Influenza A by PCR NEGATIVE NEGATIVE Final   Influenza B by PCR NEGATIVE NEGATIVE Final    Comment: (NOTE) The Xpert Xpress SARS-CoV-2/FLU/RSV assay is intended as an aid in  the diagnosis of influenza from Nasopharyngeal swab specimens and  should not be used as a sole basis for treatment. Nasal washings and  aspirates are unacceptable for Xpert Xpress SARS-CoV-2/FLU/RSV  testing.  Fact Sheet for Patients: PinkCheek.be  Fact Sheet for Healthcare Providers: GravelBags.it  This test is not yet approved or cleared by the [place] FDA and  has been authorized for detection and/or diagnosis of SARS-CoV-2 by  FDA under an Emergency Use Authorization (EUA). This EUA will remain  in effect (meaning this test can be used) for the duration of the  Covid-19 declaration under Section 564(b)(1) of the Act, 21  U.S.C. section 360bbb-3(b)(1), unless the authorization is  terminated or revoked. Performed at [facility], [place] [address], [place], [place] [address]   Culture, blood (Routine X 2) w Reflex to ID Panel     Status: None (Preliminary result)   Collection Time: 01/18/2020  2:14 PM   Specimen: BLOOD LEFT HAND  Result Value Ref Range Status   Specimen Description   Final    BLOOD LEFT HAND Performed at [facility], [place]  [address] [place], [place] [address]    Special Requests   Final    BOTTLES DRAWN AEROBIC ONLY Blood Culture results may not be optimal due to an inadequate volume of blood received in culture bottles Performed at [place] [address], [place],  [address]  Culture   Final    NO GROWTH 2 DAYS Performed at [facility], [place] [address], [place], [place] [address]    Report Status PENDING  Incomplete  Culture, blood (Routine X 2) w Reflex to ID Panel     Status: None (Preliminary result)   Collection Time: 12/31/2019  2:14 PM   Specimen: BLOOD  Result Value Ref Range Status   Specimen Description   Final    BLOOD RIGHT ANTECUBITAL Performed at [place] [address], [place], [place] [address]    Special Requests   Final    BOTTLES DRAWN AEROBIC AND ANAEROBIC Blood Culture results may not be optimal due to an inadequate volume of blood received in culture bottles Performed at [place] [address] [place], [place] [address]    Culture   Final    NO GROWTH 2 DAYS Performed at [place] [address] [place], [place] [address]    Report Status PENDING  Incomplete  MRSA PCR Screening     Status: None   Collection Time: 12/31/2019  5:38 PM   Specimen: Nasopharyngeal  Result Value Ref Range Status   MRSA by PCR NEGATIVE NEGATIVE Final    Comment:        The GeneXpert MRSA Assay (FDA approved for NASAL specimens only), is one component of a comprehensive MRSA colonization surveillance program. It is not intended to diagnose MRSA infection nor to guide or monitor treatment for MRSA infections. Performed at [facility], [place] [address] [place], [place] [address]       Radiology Studies: [facility] Chest Port 1 View  Result Date: 01/16/2020 CLINICAL DATA:  Acute respiratory failure.  COVID positive EXAM: PORTABLE CHEST 1 VIEW COMPARISON:  01/08/2020 FINDINGS: Cardiac  pacemaker. Diffuse cardiac enlargement. Prominent left pulmonary outflow tract. Calcified and tortuous aorta. Bilateral perihilar and basilar infiltrates are similar to previous study and likely represent changes due to multifocal pneumonia. No pleural effusions. No pneumothorax. IMPRESSION: Persistent bilateral perihilar and basilar infiltrates. Electronically Signed   By: Lucienne Capers M.D.   On: 01/16/2020 05:51   DG Chest Port 1 View  Result Date: 01/25/2020 CLINICAL DATA:  Shortness of breath EXAM: PORTABLE CHEST 1 VIEW COMPARISON:  January 01, 2020 FINDINGS: There is extensive airspace opacity in both mid and lower lung regions, more on the right than on the left. More subtle opacity noted in the right upper lobe. There is cardiomegaly with central pulmonary vascular prominence and peripheral tapering consistent with pulmonary arterial hypertension. Pacemaker leads attached to right atrium, right ventricle, and coronary sinus. No adenopathy appreciable by radiography. No bone lesions. There is aortic atherosclerosis. IMPRESSION: Cardiomegaly with pulmonary arterial hypertension. Question a degree of cor pulmonale. Widespread airspace opacity bilaterally, likely representing multifocal pneumonia. Aspiration could present similarly. Both aspiration and pneumonia may present concurrently. Aortic Atherosclerosis (ICD10-I70.0). Electronically Signed   By: Lowella Grip III M.D.   On: 01/25/2020 12:22    Marzetta Board, MD, PhD [facility]  Between 7 am - 7 pm I am available, please contact me via Amion or Securechat  Between 7 pm - 7 am I am not available, please contact night coverage MD/APP via Amion

## 2020-01-16 NOTE — TOC Progression Note (Signed)
Transition of Care [facility]) - Progression Note    Patient Details  Name: [name] MRN: [id number] Date of Birth: [birth year]  Transition of Care [facility]) CM/SW Contact  Leeroy Cha, RN Phone Number: 01/16/2020, 7:56 AM  Clinical Narrative:    hfnc changes to [place] at 5l/min, iv solu medrol, iv maxipime, kcl runs x5 today, Remedesivir through [place] following for progression and home with self care. Respiratory status improving.        Expected Discharge Plan and Services                                                 Social Determinants of Health (SDOH) Interventions    Readmission Risk Interventions No flowsheet data found.

## 2020-01-17 DIAGNOSIS — J1282 Pneumonia due to coronavirus disease 2019: Secondary | ICD-10-CM | POA: Diagnosis not present

## 2020-01-17 DIAGNOSIS — Z7189 Other specified counseling: Secondary | ICD-10-CM

## 2020-01-17 DIAGNOSIS — U071 COVID-19: Secondary | ICD-10-CM | POA: Diagnosis not present

## 2020-01-17 DIAGNOSIS — Z515 Encounter for palliative care: Secondary | ICD-10-CM | POA: Diagnosis not present

## 2020-01-17 DIAGNOSIS — J80 Acute respiratory distress syndrome: Secondary | ICD-10-CM | POA: Diagnosis not present

## 2020-01-17 DIAGNOSIS — G9341 Metabolic encephalopathy: Secondary | ICD-10-CM | POA: Diagnosis not present

## 2020-01-17 LAB — CBC
HCT: 37.6 % (ref 36.0–46.0)
Hemoglobin: 12 g/dL (ref 12.0–15.0)
MCH: 31.4 pg (ref 26.0–34.0)
MCHC: 31.9 g/dL (ref 30.0–36.0)
MCV: 98.4 fL (ref 80.0–100.0)
Platelets: 220 10*3/uL (ref 150–400)
RBC: 3.82 MIL/uL — ABNORMAL LOW (ref 3.87–5.11)
RDW: 18.1 % — ABNORMAL HIGH (ref 11.5–15.5)
WBC: 15.4 10*3/uL — ABNORMAL HIGH (ref 4.0–10.5)
nRBC: 0 % (ref 0.0–0.2)

## 2020-01-17 LAB — COMPREHENSIVE METABOLIC PANEL
ALT: 77 U/L — ABNORMAL HIGH (ref 0–44)
AST: 73 U/L — ABNORMAL HIGH (ref 15–41)
Albumin: 3.1 g/dL — ABNORMAL LOW (ref 3.5–5.0)
Alkaline Phosphatase: 89 U/L (ref 38–126)
Anion gap: 13 (ref 5–15)
BUN: 37 mg/dL — ABNORMAL HIGH (ref 8–23)
CO2: 25 mmol/L (ref 22–32)
Calcium: 9 mg/dL (ref 8.9–10.3)
Chloride: 105 mmol/L (ref 98–111)
Creatinine, Ser: 1.13 mg/dL — ABNORMAL HIGH (ref 0.44–1.00)
GFR, Estimated: 48 mL/min — ABNORMAL LOW (ref >60)
Glucose, Bld: 122 mg/dL — ABNORMAL HIGH (ref 70–99)
Potassium: 4.1 mmol/L (ref 3.5–5.1)
Sodium: 143 mmol/L (ref 135–145)
Total Bilirubin: 1.2 mg/dL (ref 0.3–1.2)
Total Protein: 6.6 g/dL (ref 6.5–8.1)

## 2020-01-17 LAB — GLUCOSE, CAPILLARY
Glucose-Capillary: 112 mg/dL — ABNORMAL HIGH (ref 70–99)
Glucose-Capillary: 112 mg/dL — ABNORMAL HIGH (ref 70–99)
Glucose-Capillary: 119 mg/dL — ABNORMAL HIGH (ref 70–99)
Glucose-Capillary: 119 mg/dL — ABNORMAL HIGH (ref 70–99)
Glucose-Capillary: 138 mg/dL — ABNORMAL HIGH (ref 70–99)
Glucose-Capillary: 140 mg/dL — ABNORMAL HIGH (ref 70–99)
Glucose-Capillary: 161 mg/dL — ABNORMAL HIGH (ref 70–99)

## 2020-01-17 LAB — C-REACTIVE PROTEIN: CRP: 4.3 mg/dL — ABNORMAL HIGH (ref <1.0)

## 2020-01-17 LAB — D-DIMER, QUANTITATIVE: D-Dimer, Quant: 0.98 ug/mL-FEU — ABNORMAL HIGH (ref 0.00–0.50)

## 2020-01-17 MED ORDER — ASPIRIN EC 81 MG PO TBEC
81.0000 mg | DELAYED_RELEASE_TABLET | Freq: Every evening | ORAL | Status: DC
  Administered 2020-01-17: 81 mg via ORAL

## 2020-01-17 MED ORDER — LEVOTHYROXINE SODIUM 88 MCG PO TABS
88.0000 ug | ORAL_TABLET | Freq: Every day | ORAL | Status: DC
  Administered 2020-01-17 – 2020-01-18 (×2): 88 ug via ORAL
  Filled 2020-01-17 (×2): qty 1

## 2020-01-17 MED ORDER — SIMVASTATIN 10 MG PO TABS
10.0000 mg | ORAL_TABLET | Freq: Every day | ORAL | Status: DC
  Administered 2020-01-17 – 2020-01-18 (×2): 10 mg via ORAL
  Filled 2020-01-17 (×2): qty 1

## 2020-01-17 MED ORDER — FUROSEMIDE 10 MG/ML IJ SOLN
40.0000 mg | Freq: Once | INTRAMUSCULAR | Status: AC
  Administered 2020-01-17: 40 mg via INTRAVENOUS
  Filled 2020-01-17: qty 4

## 2020-01-17 NOTE — Evaluation (Signed)
Physical Therapy Evaluation Patient Details Name: [name] MRN: [id number] DOB: Sep 20, 1945 Today's Date: 01/17/2020   History of Present Illness  74 yo Pt with hx of coronary artery disease , mixed congestive heart failure with EF of 20 to 25%, COPD who was recently hospitalized  for CHF exacerbation , several Ed visits recently,, who presents 10/16 with EMS for respiratory distress. Patient comes from home because home health went out to see her for the 1st time today and she appeared to not be breathing well, confused and covered in her own stool. Family present per paramedics were not helpful and could not give any additional information.  Clinical Impression  The patient resting in bed on 4 L [place], SPO2 97%. Began assisting patient to roll, patient becoming agitated, kicking legs in air, hitting at therpist.. With 2 persons, able to roll and wash patient up/change linens safely..  At this time , patient is unable to participate. Uncertain of family caregivers at this time. Per Rn, patient is not eating.   Will reassess patient's ability to participate next visit, consider signing off if no change in patient status.    Follow Up Recommendations  (unsure, pt not able to participate)    Equipment Recommendations  None recommended by PT    Recommendations for Other Services       Precautions / Restrictions Precautions Precautions: Fall Precaution Comments: can be combative, has on mitts      Mobility  Bed Mobility Overal bed mobility: Needs Assistance Bed Mobility: Rolling Rolling: Total assist;+2 for physical assistance;+2 for safety/equipment         General bed mobility comments: due to patient hitting and kicking out.  Transfers                 General transfer comment: NT  Ambulation/Gait                Stairs            Wheelchair Mobility    Modified Rankin (Stroke Patients Only)       Balance                                              Pertinent Vitals/Pain Pain Location: yells out [place], with rolling attempts    Home Living Family/patient expects to be discharged to:: Private residence Living Arrangements: Spouse/significant other               Additional Comments: unsure of family availble, chart lists a spouse as primary contact. was ambulatory in ED 01/02/20    Prior Function Level of Independence: Independent         Comments: unsure     Hand Dominance        Extremity/Trunk Assessment   Upper Extremity Assessment Upper Extremity Assessment:  (moves arms flailing,)    Lower Extremity Assessment Lower Extremity Assessment: RLE deficits/detail;LLE deficits/detail RLE Deficits / Details: lifts legs into air, able to kick at PT while pt. is on her back LLE Deficits / Details: similar to right ,but less efforts to kick out       Communication   Communication:  (pt only states "Daddy, Iona Beard. ")  Cognition Arousal/Alertness: Lethargic Behavior During Therapy: Restless;Agitated Overall Cognitive Status: No family/caregiver present to determine baseline cognitive functioning  General Comments: does not follow commands, kicking out at  persons assisting in bed mobility/hygiene      General Comments      Exercises     Assessment/Plan    PT Assessment Patient needs continued PT services (trial)  PT Problem List Decreased strength;Decreased cognition;Decreased safety awareness;Decreased mobility       PT Treatment Interventions Functional mobility training;Therapeutic activities;Patient/family education;Cognitive remediation    PT Goals (Current goals can be found in the Care Plan section)  Acute Rehab PT Goals PT Goal Formulation: Patient unable to participate in goal setting Time For Goal Achievement: 01/31/20 Potential to Achieve Goals: Poor    Frequency Min 2X/week (trial)   Barriers to discharge Decreased  caregiver support      Co-evaluation               AM-PAC PT "6 Clicks" Mobility  Outcome Measure Help needed turning from your back to your side while in a flat bed without using bedrails?: Total Help needed moving from lying on your back to sitting on the side of a flat bed without using bedrails?: Total Help needed moving to and from a bed to a chair (including a wheelchair)?: Total Help needed standing up from a chair using your arms (e.g., wheelchair or bedside chair)?: Total Help needed to walk in hospital room?: Total Help needed climbing 3-5 steps with a railing? : Total 6 Click Score: 6    End of Session   Activity Tolerance: Treatment limited secondary to agitation Patient left: in bed;with call bell/phone within reach Nurse Communication: Mobility status PT Visit Diagnosis: Difficulty in walking, not elsewhere classified (R26.2);Adult, failure to thrive (R62.7)    Time: 4799-8721 PT Time Calculation (min) (ACUTE ONLY): 25 min   Charges:   PT Evaluation $PT Eval Low Complexity: 1 Low PT Treatments $Therapeutic Activity: 8-22 mins        Tresa Endo PT Acute Rehabilitation Services Pager [phone] Office [phone]   Claretha Cooper 01/17/2020, 4:21 PM

## 2020-01-17 NOTE — Progress Notes (Signed)
PROGRESS NOTE  [name] EXN:[id number] DOB: [birth year] DOA: 01/05/2020 PCP: Shirline Frees, MD   LOS: 3 days   Brief Narrative / Interim history: 74 year old unvaccinated female with history of CAD, chronic systolic CHF with EF 49-67%, COPD, came in with acute respiratory failure, encephalopathy, elevated lactic acid and sepsis in the setting of COVID-19 infection.  She was initially admitted to the ICU with significantly high oxygen requirements, but from oxygenation standpoint she has improved and was transferred to the hospitalist service on 10/18.  Subjective / 24h Interval events: Has mittens on, she has been more confused.  Assessment & Plan:  Principal Problem Acute Hypoxic Respiratory Failure due to Covid-19 Viral Illness -Patient was initially admitted to the ICU requiring heated high flow.  She has been started on remdesivir, steroids, was given Actemra and placed on empiric antibiotics -From respiratory standpoint she is improving, 4 L this morning. -Continue to wean off as tolerated   COVID-19 Labs  Recent Labs    01/09/2020 1414 01/21/2020 1414 01/17/2020 1635 01/15/20 0303 01/15/20 0321 01/16/20 0310 01/17/20 0420  DDIMER 1.55*   < >  --  1.62*  --  1.43* 0.98*  FERRITIN 999*  --   --  737*  --   --   --   LDH 574*  --  678*  --   --   --   --   CRP 6.8*   < >  --   --  7.4* 6.4* 4.3*   < > = values in this interval not displayed.    Lab Results  Component Value Date   SARSCOV2NAA POSITIVE (A) 12/30/2019   [place] NEGATIVE 12/29/2019   [employer] NOT DETECTED 07/10/2018   Active Problems Severe sepsis, elevated lactic acid -Received IV fluids, she was initially placed on antibiotics with vancomycin and cefepime, currently on cefepime alone since MRSA PCR was negative -Complete 5 days of antibiotics with last day tomorrow  CAD, cardiomyopathy, EF 59-16%, chronic systolic CHF -Hold further IV fluids, appears euvolemic.  Continue statin and  aspirin -Hold Coreg, ramipril, spironolactone due to soft blood pressures.  Will give Lasix today given persistent wheezing  Severe malnutrition -Nutrition consult  Essential hypertension -Blood pressure remains on the low side, hold Coreg, ramipril, spironolactone.  Will give Lasix x1 today and reassess tomorrow  Acute metabolic encephalopathy -Husband denies prior history of dementia or memory problems, possibly in the setting of acute illness.  Family does report that patient has been increasingly less willing to take care of herself, take her medications but denies any memory problems or dementia.  She remains encephalopathic today, suspect related to Covid and hypoxia.  Patient is not eating, and if this remains this will comfort a very poor prognosis.  I have consulted palliative today as well.  Hyperlipidemia -Resume simvastatin  COPD with acute exacerbation, ongoing tobacco use -On steroids, inhalers, antibiotics, has persistent wheezing this morning  Type 2 diabetes mellitus -Hold Levemir, she is not eating much.  Continue sliding scale, CBGs controlled  CBG (last 3)  Recent Labs    01/16/20 2359 01/17/20 0404 01/17/20 0746  GLUCAP 112* 119* 112*    Scheduled Meds: . aspirin EC  81 mg Oral QPM  . Chlorhexidine Gluconate Cloth  6 each Topical Daily  . enoxaparin (LOVENOX) injection  40 mg Subcutaneous Q24H  . famotidine  20 mg Oral QHS  . feeding supplement  1 Container Oral Q24H  . feeding supplement  237 mL Oral BID BM  .  insulin aspart  0-9 Units Subcutaneous Q4H  . levothyroxine  88 mcg Oral Q0600  . mometasone-formoterol  2 puff Inhalation BID  . multivitamin with minerals  1 tablet Oral Daily  . [START ON 01/18/2020] predniSONE  50 mg Oral Q breakfast  . simvastatin  10 mg Oral Daily   Continuous Infusions: . ceFEPime (MAXIPIME) IV 2 g (01/17/20 0814)  . remdesivir 100 mg in NS 100 mL Stopped (01/16/20 1445)   PRN Meds:.  DVT prophylaxis: Lovenox Code  Status: DNR Family Communication: d/w [name], spouse, [phone]   Status is: Inpatient  Remains inpatient appropriate because:Inpatient level of care appropriate due to severity of illness   Dispo: The patient is from: Home              Anticipated d/c is to: Home              Anticipated d/c date is: 3 days              Patient currently is not medically stable to d/c.  Consultants:  PCCM  Procedures:  None   Microbiology: None   Antibacterials: Vancomycin 10/16-10/17 Cefepime 10/16 >>  Objective: Vitals:   01/16/20 1900 01/16/20 2218 01/17/20 0401 01/17/20 0500  BP:  107/72 110/61   Pulse:  69 77   Resp:  15 18   Temp:  97.7 F (36.5 C) 97.7 F (36.5 C)   TempSrc: Oral  Oral   SpO2:  99% 98%   Weight:    65.5 kg  Height:        Intake/Output Summary (Last 24 hours) at 01/17/2020 0958 Last data filed at 01/16/2020 1200 Gross per 24 hour  Intake 242.91 ml  Output --  Net 242.91 ml   Filed Weights   01/15/20 0423 01/16/20 0500 01/17/20 0500  Weight: 65.8 kg 65.3 kg 65.5 kg    Examination:  Constitutional: Confused, has mittens on Eyes: No scleral icterus ENMT: mmm Neck: normal, supple Respiratory: Increased work of breathing, diffuse bilateral wheezing Cardiovascular: Regular rate and rhythm, no murmurs, no peripheral edema Abdomen: Soft, nontender, nondistended, bowel sounds positive Musculoskeletal: no clubbing / cyanosis.  Skin: No rashes appreciated Neurologic: Nonfocal, does not follow commands consistently but with all 4 extremities independently Psychiatric: Alert to self   Data Reviewed: I have independently reviewed following labs and imaging studies   CBC: Recent Labs  Lab 01/21/2020 1414 01/16/20 0310 01/17/20 0420  WBC 8.0 15.0* 15.4*  NEUTROABS 6.7  --   --   HGB 11.5* 11.8* 12.0  HCT 35.3* 36.3 37.6  MCV 97.0 95.5 98.4  PLT 143* 176 638   Basic Metabolic Panel: Recent Labs  Lab 01/12/2020 1138 01/17/2020 1138  01/21/2020 1635 01/15/20 0255 01/15/20 1231 01/16/20 0310 01/17/20 0420  NA 139  --   --  139 137 140 143  K 4.6  --   --  3.9 3.9 3.4* 4.1  CL 95*  --   --  100 97* 100 105  CO2 16*  --   --  24 24 24 25   GLUCOSE 119*  --   --  130* 105* 112* 122*  BUN 25*  --   --  27* 30* 34* 37*  CREATININE 1.57*   < > 1.40* 1.25* 1.31* 0.96 1.13*  CALCIUM 9.6  --   --  8.5* 8.6* 8.7* 9.0  MG  --   --   --   --   --  2.5*  --  PHOS  --   --   --   --   --  3.5  --    < > = values in this interval not displayed.   GFR: Estimated Creatinine Clearance: 39.1 mL/min (A) (by C-G formula based on SCr of 1.13 mg/dL (H)). Liver Function Tests: Recent Labs  Lab 01/24/2020 1138 01/15/20 0255 01/15/20 1231 01/16/20 0310 01/17/20 0420  AST 102* 102* 105* 92* 73*  ALT 59* 59* 66* 72* 77*  ALKPHOS 148* 102 102 101 89  BILITOT 2.2* 1.4* 1.1 1.3* 1.2  PROT 8.1 6.7 6.6 6.7 6.6  ALBUMIN 4.3 3.6 3.4* 3.4* 3.1*   No results for input(s): LIPASE, AMYLASE in the last 168 hours. No results for input(s): AMMONIA in the last 168 hours. Coagulation Profile: No results for input(s): INR, PROTIME in the last 168 hours. Cardiac Enzymes: No results for input(s): CKTOTAL, CKMB, CKMBINDEX, TROPONINI in the last 168 hours. BNP (last 3 results) No results for input(s): PROBNP in the last 8760 hours. HbA1C: No results for input(s): HGBA1C in the last 72 hours. CBG: Recent Labs  Lab 01/16/20 1619 01/16/20 2108 01/16/20 2359 01/17/20 0404 01/17/20 0746  GLUCAP 106* 128* 112* 119* 112*   Lipid Profile: Recent Labs    01/20/2020 1414  TRIG 97   Thyroid Function Tests: No results for input(s): TSH, T4TOTAL, FREET4, T3FREE, THYROIDAB in the last 72 hours. Anemia Panel: Recent Labs    01/08/2020 1414 01/15/20 0303  FERRITIN 999* 737*   Urine analysis:    Component Value Date/Time   COLORURINE COLORLESS (A) 07/10/2018 0719   APPEARANCEUR CLEAR 07/10/2018 0719   LABSPEC 1.005 07/10/2018 0719   PHURINE 6.0  07/10/2018 0719   GLUCOSEU NEGATIVE 07/10/2018 0719   HGBUR SMALL (A) 07/10/2018 0719   BILIRUBINUR NEGATIVE 07/10/2018 0719   KETONESUR NEGATIVE 07/10/2018 0719   PROTEINUR NEGATIVE 07/10/2018 0719   UROBILINOGEN 1.0 03/22/2011 2007   NITRITE NEGATIVE 07/10/2018 0719   LEUKOCYTESUR TRACE (A) 07/10/2018 0719   Sepsis Labs: Invalid input(s): PROCALCITONIN, LACTICIDVEN  Recent Results (from the past 240 hour(s))  Respiratory Panel by RT PCR (Flu A&B, Covid) - Nasopharyngeal Swab     Status: Abnormal   Collection Time: 01/09/2020 11:40 AM   Specimen: Nasopharyngeal Swab  Result Value Ref Range Status   SARS Coronavirus 2 by RT PCR POSITIVE (A) NEGATIVE Final    Comment: (NOTE) SARS-CoV-2 target nucleic acids are DETECTED.  SARS-CoV-2 RNA is generally detectable in upper respiratory specimens  during the acute phase of infection. Positive results are indicative of the presence of the identified virus, but do not rule out bacterial infection or co-infection with other pathogens not detected by the test. Clinical correlation with patient history and other diagnostic information is necessary to determine patient infection status. The expected result is Negative.  Fact Sheet for Patients:  PinkCheek.be  Fact Sheet for Healthcare Providers: GravelBags.it  This test is not yet approved or cleared by the [place] FDA and  has been authorized for detection and/or diagnosis of SARS-CoV-2 by FDA under an Emergency Use Authorization (EUA).  This EUA will remain in effect (meaning this test can be used) for the duration of  the COVID-19 declaration under Section 564(b)(1) of the Act , 21 U.S.C. section 360bbb-3(b)(1), unless the authorization is terminated or revoked sooner.      Influenza A by PCR NEGATIVE NEGATIVE Final   Influenza B by PCR NEGATIVE NEGATIVE Final    Comment: (NOTE) The Xpert Xpress SARS-CoV-2/FLU/RSV  assay is intended as an aid in  the diagnosis of influenza from Nasopharyngeal swab specimens and  should not be used as a sole basis for treatment. Nasal washings and  aspirates are unacceptable for Xpert Xpress SARS-CoV-2/FLU/RSV  testing.  Fact Sheet for Patients: PinkCheek.be  Fact Sheet for Healthcare Providers: GravelBags.it  This test is not yet approved or cleared by the [place] FDA and  has been authorized for detection and/or diagnosis of SARS-CoV-2 by  FDA under an Emergency Use Authorization (EUA). This EUA will remain  in effect (meaning this test can be used) for the duration of the  Covid-19 declaration under Section 564(b)(1) of the Act, 21  U.S.C. section 360bbb-3(b)(1), unless the authorization is  terminated or revoked. Performed at [facility], [place] [address], [place], [place] [address]   Culture, blood (Routine X 2) w Reflex to ID Panel     Status: None (Preliminary result)   Collection Time: 01/05/2020  2:14 PM   Specimen: BLOOD LEFT HAND  Result Value Ref Range Status   Specimen Description   Final    BLOOD LEFT HAND Performed at [facility], [place] [address], [place], [place] [address]    Special Requests   Final    BOTTLES DRAWN AEROBIC ONLY Blood Culture results may not be optimal due to an inadequate volume of blood received in culture bottles Performed at [place] [address], [place], [place] [address]    Culture   Final    NO GROWTH 3 DAYS Performed at [facility], [place] [address] [place], [place] [address]    Report Status PENDING  Incomplete  Culture, blood (Routine X 2) w Reflex to ID Panel     Status: None (Preliminary result)   Collection Time: 01/23/2020  2:14 PM   Specimen: BLOOD  Result Value Ref Range Status   Specimen Description   Final    BLOOD RIGHT ANTECUBITAL Performed at [facility]  [address], [place], [place] [address]    Special Requests   Final    BOTTLES DRAWN AEROBIC AND ANAEROBIC Blood Culture results may not be optimal due to an inadequate volume of blood received in culture bottles Performed at [place] [address], [place], [place] [address]    Culture   Final    NO GROWTH 3 DAYS Performed at [facility], [place] [address], [place], [place] [address]    Report Status PENDING  Incomplete  MRSA PCR Screening     Status: None   Collection Time: 01/03/2020  5:38 PM   Specimen: Nasopharyngeal  Result Value Ref Range Status   MRSA by PCR NEGATIVE NEGATIVE Final    Comment:        The GeneXpert MRSA Assay (FDA approved for NASAL specimens only), is one component of a comprehensive MRSA colonization surveillance program. It is not intended to diagnose MRSA infection nor to guide or monitor treatment for MRSA infections. Performed at [facility], [place] [address] [place], [place] [address]       Radiology Studies: [facility] Chest Port 1 View  Result Date: 01/16/2020 CLINICAL DATA:  Acute respiratory failure.  COVID positive EXAM: PORTABLE CHEST 1 VIEW COMPARISON:  01/02/2020 FINDINGS: Cardiac pacemaker. Diffuse cardiac enlargement. Prominent left pulmonary outflow tract. Calcified and tortuous aorta. Bilateral perihilar and basilar infiltrates are similar to previous study and likely represent changes due to multifocal pneumonia. No pleural  effusions. No pneumothorax. IMPRESSION: Persistent bilateral perihilar and basilar infiltrates. Electronically Signed   By: Lucienne Capers M.D.   On: 01/16/2020 05:51    Marzetta Board, MD, PhD [facility]  Between 7 am - 7 pm I am available, please contact me via Amion or Securechat  Between 7 pm - 7 am I am not available, please contact night coverage MD/APP via Amion

## 2020-01-17 NOTE — Care Management Important Message (Signed)
Important Message  Patient Details IM Letter given to the Patient Name: ARRIYANNA MERSCH MRN: [id number] Date of Birth: June 06, 1945   Medicare Important Message Given:  Yes     Kerin Salen 01/17/2020, 10:49 AM

## 2020-01-17 NOTE — Consult Note (Signed)
Consultation Note Date: 01/17/2020   Patient Name: [name]  DOB: [birth year]  MRN: [id number]  Age / Sex: 74 y.o., female  PCP: Shirline Frees, MD Referring Physician: Caren Griffins, MD  Reason for Consultation: Establishing goals of care  HPI/Patient Profile: 74 y.o. female  with past medical history of coronary artery disease, mixed congestive heart.  EF 20 to 25%, COPD with recent hospitalization for CHF exacerbation admitted on 01/23/2020 with respiratory distress.  She was found to be Covid positive.  Initially, concern for respiratory failure but this seems to improved in the past couple of days.  Her mental status, however, remains poor and she has been refusing to eat.  Palliative consulted for goals of care.  Clinical Assessment and Goals of Care: Plan of care consult received.  Chart reviewed including personal review of pertinent labs and imaging.  I saw and examined [name] today.  I introduced palliative care as specialized medical care for people living with serious illness. It focuses on providing relief from the symptoms and stress of a serious illness. The goal is to improve quality of life for both the patient and the family.  She was lying in bed in no distress.  She was awake and attempted to answer simple questions, but she was not really able to engage in more complex conversation such as discussion of her current situation or goals of care.  I called and left a voicemail for her husband requesting return call.  I will plan to reach out again to him tomorrow.  Clinical:  SUMMARY OF RECOMMENDATIONS   -DNR/DNI -Attempted to discuss goals with patient today.  She is not able to participate in this conversation at this time. -Attempted to call her husband to discuss.  I left a voicemail requesting return call.  Await return call from family.  Code Status/Advance Care  Planning:  DNR  Prognosis:   Guarded  Discharge Planning: To Be Determined      Primary Diagnoses: Present on Admission: . Acute respiratory distress syndrome (ARDS) due to COVID-19 virus [facility])   I have reviewed the medical record, interviewed the patient and family, and examined the patient. The following aspects are pertinent.  Past Medical History:  Diagnosis Date  . Anemia   . CAD (coronary artery disease)    Prior LAD stenting with stent thrombosis occurring in a withdrawal phase of her Plavix undertaken because of trigeminal neuralgia and release surgery. It was treated medically.  . Cancer ([place])    small bladder cancer removed no  treatment  . Cataract   . CHF (congestive heart failure) ([place])   . Complication of anesthesia    massive heart attack  . GERD (gastroesophageal reflux disease)   . Hyperlipidemia   . Ischemic cardiomyopathy   . Osteoarthritis   . SOB (shortness of breath)    Social History   Socioeconomic History  . Marital status: Legally Separated    Spouse name: Not on file  . Number of children: Not on file  .  Years of education: Not on file  . Highest education level: Not on file  Occupational History  . Not on file  Tobacco Use  . Smoking status: Current Some Day Smoker    Packs/day: 0.50    Years: 30.00    Pack years: 15.00    Types: Cigarettes  . Smokeless tobacco: Never Used  Substance and Sexual Activity  . Alcohol use: No  . Drug use: No  . Sexual activity: Never  Other Topics Concern  . Not on file  Social History Narrative  . Not on file   Social Determinants of Health   Financial Resource Strain:   . Difficulty of Paying Living Expenses: Not on file  Food Insecurity:   . Worried About Charity fundraiser in the Last Year: Not on file  . Ran Out of Food in the Last Year: Not on file  Transportation Needs:   . Lack of Transportation (Medical): Not on file  . Lack of Transportation (Non-Medical): Not on file  Physical  Activity:   . Days of Exercise per Week: Not on file  . Minutes of Exercise per Session: Not on file  Stress:   . Feeling of Stress : Not on file  Social Connections:   . Frequency of Communication with Friends and Family: Not on file  . Frequency of Social Gatherings with Friends and Family: Not on file  . Attends Religious Services: Not on file  . Active Member of Clubs or Organizations: Not on file  . Attends Archivist Meetings: Not on file  . Marital Status: Not on file   Family History  Problem Relation Age of Onset  . Heart disease Father    Scheduled Meds: . aspirin EC  81 mg Oral QPM  . Chlorhexidine Gluconate Cloth  6 each Topical Daily  . enoxaparin (LOVENOX) injection  40 mg Subcutaneous Q24H  . famotidine  20 mg Oral QHS  . feeding supplement  1 Container Oral Q24H  . feeding supplement  237 mL Oral BID BM  . insulin aspart  0-9 Units Subcutaneous Q4H  . levothyroxine  88 mcg Oral Q0600  . mometasone-formoterol  2 puff Inhalation BID  . multivitamin with minerals  1 tablet Oral Daily  . [START ON 01/18/2020] predniSONE  50 mg Oral Q breakfast  . simvastatin  10 mg Oral Daily   Continuous Infusions: . ceFEPime (MAXIPIME) IV 2 g (01/17/20 2055)  . remdesivir 100 mg in NS 100 mL Stopped (01/17/20 1120)   PRN Meds:. Medications Prior to Admission:  Prior to Admission medications   Medication Sig Start Date End Date Taking? Authorizing Provider  zolpidem (AMBIEN) 5 MG tablet Take 5 mg by mouth at bedtime as needed for sleep.   Yes [provider]  albuterol (PROAIR HFA) 108 (90 Base) MCG/ACT inhaler Inhale 2 puffs into the lungs every 6 (six) hours as needed for wheezing or shortness of breath.    [provider]  aspirin EC 81 MG tablet Take 81 mg by mouth every evening.  04/19/15   Deboraha Sprang, MD  carvedilol (COREG) 3.125 MG tablet TAKE 1 TABLET BY MOUTH TWICE DAILY WITH A MEAL Patient taking differently: Take 3.125 mg by mouth in  the morning and at bedtime.  10/17/19   Jerline Pain, MD  clorazepate (TRANXENE) 3.75 MG tablet Take 3.75 mg by mouth every morning. 10/15/19   [provider]  cyclobenzaprine (FLEXERIL) 5 MG tablet Take 5 mg by mouth  daily as needed for muscle spasms.     [provider]  Ferrous Sulfate 27 MG TABS Take 27 mg by mouth every evening.    [provider]  folic acid (FOLVITE) 1 MG tablet Take 1 tablet (1 mg total) by mouth daily. 07/13/18   Lavina Hamman, MD  furosemide (LASIX) 80 MG tablet Take 1 tablet (80 mg total) by mouth daily. 07/14/19   Deboraha Sprang, MD  gabapentin (NEURONTIN) 800 MG tablet Take 0.5 tablets (400 mg total) by mouth 3 (three) times daily. For pain Patient taking differently: Take 400 mg by mouth in the morning, at noon, in the evening, and at bedtime.  07/14/18   Domenic Polite, MD  levothyroxine (SYNTHROID) 88 MCG tablet Take 1 tablet (88 mcg total) by mouth daily. 07/07/19   Elayne Snare, MD  mirtazapine (REMERON) 45 MG tablet Take 45 mg by mouth at bedtime. 05/17/18   [provider]  nitroGLYCERIN (NITROSTAT) 0.4 MG SL tablet Place 1 tablet (0.4 mg total) under the tongue every 5 (five) minutes as needed for chest pain. Please make yearly appt with [name] for June. 1st attempt Patient taking differently: Place 0.4 mg under the tongue every 5 (five) minutes as needed for chest pain.  07/04/19   Jerline Pain, MD  OVER THE COUNTER MEDICATION Apply 1 application topically daily as needed (knee pain). Veterinary liniment    [provider]  Oxycodone HCl 10 MG TABS Take 1 tablet (10 mg total) by mouth 2 (two) times daily as needed (pain). Patient taking differently: Take 10 mg by mouth daily as needed (pain).  07/14/18   Domenic Polite, MD  potassium chloride SA (K-DUR) 20 MEQ tablet Take 1 tablet (20 mEq total) by mouth daily. 12/08/18   Jerline Pain, MD  promethazine (PHENERGAN) 25 MG tablet Take 25 mg by mouth every 6 (six) hours  as needed for nausea or vomiting.  06/22/18   [provider]  ramipril (ALTACE) 2.5 MG capsule Take 1 capsule (2.5 mg total) by mouth daily. 09/02/19   Jerline Pain, MD  simvastatin (ZOCOR) 10 MG tablet Take 1 tablet by mouth once daily Patient taking differently: Take 10 mg by mouth daily.  12/13/19   Jerline Pain, MD  spironolactone (ALDACTONE) 25 MG tablet Take 1/2 (one-half) tablet by mouth once daily Patient taking differently: Take 12.5 mg by mouth daily.  10/17/19   Jerline Pain, MD  venlafaxine XR (EFFEXOR-XR) 150 MG 24 hr capsule Take 300 mg by mouth daily with breakfast.  09/25/14   [provider]   Allergies  Allergen Reactions  . Clorazepate Dipotassium Other (See Comments)    Caused confusion per husband   Review of Systems Reliable historian, however she denies current pain or shortness of breath  Physical Exam General: Alert, awake, in no acute distress. She does not have insight into her current situation HEENT: No bruits, no goiter, no JVD Heart: Regular rate and rhythm. No murmur appreciated. Lungs: Good air movement, clear Abdomen: Soft, nontender, nondistended, positive bowel sounds.  Ext: No significant edema Skin: Warm and dry Neuro: Grossly intact, nonfocal.  Vital Signs: BP 107/71 (BP Location: Left Arm)   Pulse 61   Temp (!) 97.5 F (36.4 C) (Oral)   Resp 18   Ht 5\' 3"  (1.6 m)   Wt 65.5 kg   LMP  (LMP Unknown)   SpO2 97%   BMI 25.58 kg/m  Pain Scale: Faces  Pain Score: 0-No pain   SpO2: SpO2: 97 % O2 Device:SpO2: 97 % O2 Flow Rate: .O2 Flow Rate (L/min): 4 L/min  IO: Intake/output summary:   Intake/Output Summary (Last 24 hours) at 01/17/2020 2214 Last data filed at 01/17/2020 1455 Gross per 24 hour  Intake 30 ml  Output 400 ml  Net -370 ml    LBM: Last BM Date: 01/16/20 Baseline Weight: Weight: 59 kg Most recent weight: Weight: 65.5 kg     Palliative Assessment/Data:   Flowsheet Rows     Most Recent Value    Intake Tab  Referral Department Hospitalist  Unit at Time of Referral Med/Surg Unit  Palliative Care Primary Diagnosis Sepsis/Infectious Disease  Date Notified 01/17/20  Palliative Care Type New Palliative care  Reason for referral Clarify Goals of Care  Date of Admission 01/28/2020  # of days IP prior to Palliative referral 3  Clinical Assessment  Psychosocial & Spiritual Assessment  Palliative Care Outcomes      Time In: 1150 Time Out: 7737 Time Total: 55 Greater than 50%  of this time was spent counseling and coordinating care related to the above assessment and plan.  Signed by: Micheline Rough, MD   Please contact Palliative Medicine Team phone at [phone] for questions and concerns.  For individual provider: See Shea Evans

## 2020-01-18 ENCOUNTER — Other Ambulatory Visit: Payer: Self-pay

## 2020-01-18 DIAGNOSIS — J438 Other emphysema: Secondary | ICD-10-CM

## 2020-01-18 DIAGNOSIS — I5022 Chronic systolic (congestive) heart failure: Secondary | ICD-10-CM | POA: Diagnosis not present

## 2020-01-18 DIAGNOSIS — Z66 Do not resuscitate: Secondary | ICD-10-CM

## 2020-01-18 DIAGNOSIS — I10 Essential (primary) hypertension: Secondary | ICD-10-CM

## 2020-01-18 DIAGNOSIS — Z7189 Other specified counseling: Secondary | ICD-10-CM | POA: Diagnosis not present

## 2020-01-18 DIAGNOSIS — J9601 Acute respiratory failure with hypoxia: Secondary | ICD-10-CM | POA: Diagnosis not present

## 2020-01-18 DIAGNOSIS — E785 Hyperlipidemia, unspecified: Secondary | ICD-10-CM

## 2020-01-18 DIAGNOSIS — U071 COVID-19: Secondary | ICD-10-CM

## 2020-01-18 DIAGNOSIS — G9341 Metabolic encephalopathy: Secondary | ICD-10-CM

## 2020-01-18 DIAGNOSIS — Z515 Encounter for palliative care: Secondary | ICD-10-CM | POA: Diagnosis not present

## 2020-01-18 LAB — CBC
HCT: 40.9 % (ref 36.0–46.0)
Hemoglobin: 12.9 g/dL (ref 12.0–15.0)
MCH: 30.7 pg (ref 26.0–34.0)
MCHC: 31.5 g/dL (ref 30.0–36.0)
MCV: 97.4 fL (ref 80.0–100.0)
Platelets: 250 10*3/uL (ref 150–400)
RBC: 4.2 MIL/uL (ref 3.87–5.11)
RDW: 18.3 % — ABNORMAL HIGH (ref 11.5–15.5)
WBC: 20.2 10*3/uL — ABNORMAL HIGH (ref 4.0–10.5)
nRBC: 0 % (ref 0.0–0.2)

## 2020-01-18 LAB — COMPREHENSIVE METABOLIC PANEL
ALT: 72 U/L — ABNORMAL HIGH (ref 0–44)
AST: 49 U/L — ABNORMAL HIGH (ref 15–41)
Albumin: 3.5 g/dL (ref 3.5–5.0)
Alkaline Phosphatase: 92 U/L (ref 38–126)
Anion gap: 15 (ref 5–15)
BUN: 44 mg/dL — ABNORMAL HIGH (ref 8–23)
CO2: 23 mmol/L (ref 22–32)
Calcium: 9.2 mg/dL (ref 8.9–10.3)
Chloride: 106 mmol/L (ref 98–111)
Creatinine, Ser: 1.1 mg/dL — ABNORMAL HIGH (ref 0.44–1.00)
GFR, Estimated: 49 mL/min — ABNORMAL LOW (ref >60)
Glucose, Bld: 120 mg/dL — ABNORMAL HIGH (ref 70–99)
Potassium: 4 mmol/L (ref 3.5–5.1)
Sodium: 144 mmol/L (ref 135–145)
Total Bilirubin: 1.1 mg/dL (ref 0.3–1.2)
Total Protein: 7.1 g/dL (ref 6.5–8.1)

## 2020-01-18 LAB — GLUCOSE, CAPILLARY
Glucose-Capillary: 136 mg/dL — ABNORMAL HIGH (ref 70–99)
Glucose-Capillary: 105 mg/dL — ABNORMAL HIGH (ref 70–99)
Glucose-Capillary: 131 mg/dL — ABNORMAL HIGH (ref 70–99)
Glucose-Capillary: 132 mg/dL — ABNORMAL HIGH (ref 70–99)
Glucose-Capillary: 133 mg/dL — ABNORMAL HIGH (ref 70–99)
Glucose-Capillary: 130 mg/dL — ABNORMAL HIGH (ref 70–99)

## 2020-01-18 LAB — D-DIMER, QUANTITATIVE: D-Dimer, Quant: 1.33 ug/mL-FEU — ABNORMAL HIGH (ref 0.00–0.50)

## 2020-01-18 LAB — C-REACTIVE PROTEIN: CRP: 3.5 mg/dL — ABNORMAL HIGH (ref <1.0)

## 2020-01-18 MED ORDER — HALOPERIDOL 1 MG PO TABS
1.0000 mg | ORAL_TABLET | ORAL | Status: DC | PRN
  Filled 2020-01-18: qty 1

## 2020-01-18 MED ORDER — LEVOTHYROXINE SODIUM 100 MCG/5ML IV SOLN: 44.0000 ug | Freq: Every day | INTRAVENOUS | Status: DC

## 2020-01-18 MED ORDER — HALOPERIDOL LACTATE 5 MG/ML IJ SOLN
1.0000 mg | INTRAMUSCULAR | Status: DC | PRN
  Administered 2020-01-18 (×3): 1 mg via INTRAMUSCULAR
  Filled 2020-01-18 (×3): qty 1

## 2020-01-18 MED ORDER — METHYLPREDNISOLONE SODIUM SUCC 40 MG IJ SOLR
20.0000 mg | Freq: Every day | INTRAMUSCULAR | Status: DC
  Administered 2020-01-19 – 2020-01-20 (×2): 20 mg via INTRAVENOUS
  Filled 2020-01-18 (×2): qty 1

## 2020-01-18 MED ORDER — PANTOPRAZOLE SODIUM 40 MG IV SOLR
40.0000 mg | INTRAVENOUS | Status: DC
  Administered 2020-01-18 – 2020-01-19 (×2): 40 mg via INTRAVENOUS
  Filled 2020-01-18 (×2): qty 40

## 2020-01-18 MED ORDER — MORPHINE SULFATE (PF) 2 MG/ML IV SOLN
0.5000 mg | INTRAVENOUS | Status: DC | PRN
  Administered 2020-01-18 – 2020-01-19 (×3): 0.5 mg via INTRAVENOUS
  Filled 2020-01-18 (×3): qty 1

## 2020-01-18 MED ORDER — LEVOTHYROXINE SODIUM 88 MCG PO TABS: 88.0000 ug | ORAL_TABLET | Freq: Every day | ORAL | Status: DC

## 2020-01-18 NOTE — Progress Notes (Signed)
  Speech Language Pathology Treatment: Dysphagia  Patient Details Name: [name] MRN: [id number] DOB: 1945-12-21 Today's Date: 01/18/2020 Time: 9833-8250 SLP Time Calculation (min) (ACUTE ONLY): 40 min  Assessment / Plan / Recommendation Clinical Impression  Pt's mentation is severely impaired with decreased awareness and acceptance of po intake. SLP removed grits retained in right oral lateral sulci  which pt was holding to prevent aspiration.  Excessive oral hold with po offered without consistent swallow despite attempts of dry spoon, verbal cues for pt to swallow medication with pudding *with RN in room.  Pt is agitated and frequently hollars out and does not consistently swallow.  Cough noted post swallow - delayed - uncertain if due to COVID and/or aspiration.    Due to her very elevated asp risk at this time with mentation changes, recommend NPO x only sips water, necessary medications crushed. SLP requested RN to set up oral suction to switch over from pure wick when pt is receiving po medications for safety.    RN and NT agreeable to plan - messaged MD with recommendations.    HPI HPI: 74 yo female adm to [facility] with cough, congestion - diagnosed with COVID.  Pt went to hospital ED approx 2 weeks ago for SOB due to CHF exacerbation.  PMH + for smoking, CAD, CHF, COPD, anemia, cataract, cancer, GERD, HLD, OEA.  Pt was initially placed on 15 liters HFNC but is now down to 5 Liters.  CXR showed bilateral basilar and perihilar infiltrates.  Swallow evaluation ordered.  Pt denies dysphagia.      SLP Plan  Continue with current plan of care       Recommendations  Diet recommendations: NPO (x water and needed medications with icecream or pudding) Medication Administration: Crushed with puree Compensations: Slow rate;Small sips/bites Postural Changes and/or Swallow Maneuvers: Seated upright 90 degrees;Upright 30-60 min after meal                Oral Care Recommendations:  Oral care QID SLP Visit Diagnosis: Dysphagia, oropharyngeal phase (R13.12) Plan: Continue with current plan of care       GO                Macario Golds 01/18/2020, 11:12 AM  Kathleen Lime, MS [facility] SLP Acute Rehab Services Office [phone] Pager [phone]

## 2020-01-18 NOTE — Progress Notes (Addendum)
PROGRESS NOTE    [name]  BTD:[id number] DOB: [birth year] DOA: 01/05/2020 PCP: Shirline Frees, MD    Brief Narrative:  [name] was admitted to the hospital with working diagnosis of acute hypoxic respiratory failure due to SARS COVID-19 viral pneumonia.  74 year old female with past medical history for coronary artery disease, systolic heart failure, and COPD who presented with worsening dyspnea, respiratory distress and altered mentation.  On her initial physical examination her blood pressure was 132/62, heart rate 69, temperature 99.5, respiratory rate 32, oxygen saturation 93% on supplemental oxygen.  Her lungs are coarse breath sounds bilaterally with scattered wheezing, heart S1-S2, present rhythmic, soft abdomen, no lower extremity edema.  Chest radiograph with dense alveolar infiltrate right upper lobe, bilateral interstitial infiltrates lower lobes and left upper lobe.   She was admitted to the intensive care unit, placed on high flow nasal cannula due to high oxygen requirements.  Received medical therapy with remdesivir, systemic corticosteroids and Tocilizumab.  Transferred to [facility] 10/18.   Her hospitalization has been complicated with metabolic encephalopathy, very poor oral intake and swallow dysfunction.  Palliative care services have been consulted.  Assessment & Plan:   Principal Problem:   Pneumonia due to COVID-19 virus Active Problems:   Ischemic cardiomyopathy   Chronic systolic heart failure (HCC)   Hyperlipidemia   COPD (chronic obstructive pulmonary disease) (HCC)   Essential hypertension   Acute respiratory failure with hypoxia ([place])   DNR (do not resuscitate)   Acute metabolic encephalopathy   1.  Acute hypoxic respiratory failure due to SARS COVID-19 viral pneumonia. Sp Remdesivir #5/5, Tocilizumab #1.   RR: 20  Pulse oxymetry: 97%  Fi02: 4 L/ min per Carlyss  COVID-19 Labs  Recent Labs    01/16/20 0310 01/17/20 0420 01/18/20 0420    DDIMER 1.43* 0.98* 1.33*  CRP 6.4* 4.3* 3.5*    Lab Results  Component Value Date   SARSCOV2NAA POSITIVE (A) 01/19/2020   [place] NEGATIVE 12/29/2019   [place] NOT DETECTED 07/10/2018    Continue to have elevated inflammatory markers, she has been very confused and now not able to eat due to risk of aspiration. She has been agitated and not interactive.   Patient with low oxygen requirements but worsening encephalopathy. Continue supplemental 02 per  and bronchodilators. She is not able to do airway clearing techniques due to poor cognition.   Decrease systemic steroids to 20 mg daily. She will complete antibiotic therapy for suspected bacterial pneumonia co-infection, complicated with sepsis, not present on admission.   2. Acute metabolic encephalopathy/ COVID 19 viral encephalopathy. Patient with rapid worsening cognition, very agitated this am. Speech evaluation recommends NPO for now due to risk of aspiration.   Continue supportive medical therapy, will add haldol for agitation.  Has bilateral mittens.   3. Acute on chronic systolic heart failure/ HTN. EF 20 to 25%, continue to hold antihypertensive medications due to risk of hypotension. SP furosemide 10/19 with improve volume status today.   4, T2DM with dyslipidemia. Patient with very poor oral intake, continue to hold on insulin therapy for now.   Fasting glucose this am is 120, capillary 105 and 131.  Holding on simvastatin due to npo status.   5. COPD with acute exacerbation. Continue bronchodilator therapy and systemic steroids.   6. Moderate calorie protein malnutrition. Patient with poor prognosis, now not able to eat due to encephalopathy.   Patient continue to be at high risk for worsening encephalopathy   Status is: Inpatient  Remains inpatient appropriate because:IV treatments appropriate due to intensity of illness or inability to take PO   Dispo: The patient is from: Home              Anticipated  d/c is to: Home              Anticipated d/c date is: > 3 days              Patient currently is not medically stable to d/c.   DVT prophylaxis: Enoxaparin  Code Status:   DNR   Family Communication:  I was not able to reach her daughter over the phone, left a message.     Nutrition Status: Nutrition Problem: Increased nutrient needs Etiology: acute illness, catabolic illness ([id number] infection) Signs/Symptoms: estimated needs Interventions: Ensure Enlive (each supplement provides 350kcal and 20 grams of protein), MVI, Magic cup, Boost Breeze       Consultants:   Palliative care    Subjective: Patient is very agitated, not answering to questions or following commands, very poor oral intake. Very weak and deconditioned. Mittens bilateral hands.   Objective: Vitals:   01/17/20 1341 01/17/20 1929 01/18/20 0346 01/18/20 0500  BP: (!) 124/108 107/71 (!) 127/92   Pulse: 68 61 70   Resp: 19 18 20    Temp: (!) 97.5 F (36.4 C)     TempSrc: Oral     SpO2: 97% 97% 97%   Weight:    64.7 kg  Height:        Intake/Output Summary (Last 24 hours) at 01/18/2020 1109 Last data filed at 01/17/2020 1455 Gross per 24 hour  Intake 20 ml  Output 200 ml  Net -180 ml   Filed Weights   01/16/20 0500 01/17/20 0500 01/18/20 0500  Weight: 65.3 kg 65.5 kg 64.7 kg    Examination:   General: deconditioned and ill looking appearing  Neurology: Awake, agitated, not interactive  E ENT: positive pallor, no icterus, oral mucosa moist Cardiovascular: No JVD. S1-S2 present, rhythmic, no gallops, rubs, or murmurs. No lower extremity edema. Pulmonary:  Positive breath sounds bilaterally, no wheezing Gastrointestinal. Abdomen soft and non tender Skin. No rashes Musculoskeletal: no joint deformities     Data Reviewed: I have personally reviewed following labs and imaging studies  CBC: Recent Labs  Lab 12/30/2019 1414 01/16/20 0310 01/17/20 0420 01/18/20 0420  WBC 8.0 15.0* 15.4*  20.2*  NEUTROABS 6.7  --   --   --   HGB 11.5* 11.8* 12.0 12.9  HCT 35.3* 36.3 37.6 40.9  MCV 97.0 95.5 98.4 97.4  PLT 143* 176 220 147   Basic Metabolic Panel: Recent Labs  Lab 01/15/20 0255 01/15/20 1231 01/16/20 0310 01/17/20 0420 01/18/20 0420  NA 139 137 140 143 144  K 3.9 3.9 3.4* 4.1 4.0  CL 100 97* 100 105 106  CO2 24 24 24 25 23   GLUCOSE 130* 105* 112* 122* 120*  BUN 27* 30* 34* 37* 44*  CREATININE 1.25* 1.31* 0.96 1.13* 1.10*  CALCIUM 8.5* 8.6* 8.7* 9.0 9.2  MG  --   --  2.5*  --   --   PHOS  --   --  3.5  --   --    GFR: Estimated Creatinine Clearance: 40 mL/min (A) (by C-G formula based on SCr of 1.1 mg/dL (H)). Liver Function Tests: Recent Labs  Lab 01/15/20 0255 01/15/20 1231 01/16/20 0310 01/17/20 0420 01/18/20 0420  AST 102* 105* 92* 73* 49*  ALT 59* 66* 72*  77* 72*  ALKPHOS 102 102 101 89 92  BILITOT 1.4* 1.1 1.3* 1.2 1.1  PROT 6.7 6.6 6.7 6.6 7.1  ALBUMIN 3.6 3.4* 3.4* 3.1* 3.5   No results for input(s): LIPASE, AMYLASE in the last 168 hours. No results for input(s): AMMONIA in the last 168 hours. Coagulation Profile: No results for input(s): INR, PROTIME in the last 168 hours. Cardiac Enzymes: No results for input(s): CKTOTAL, CKMB, CKMBINDEX, TROPONINI in the last 168 hours. BNP (last 3 results) No results for input(s): PROBNP in the last 8760 hours. HbA1C: No results for input(s): HGBA1C in the last 72 hours. CBG: Recent Labs  Lab 01/17/20 1634 01/17/20 1931 01/17/20 2324 01/18/20 0352 01/18/20 0753  GLUCAP 161* 138* 119* 136* 105*   Lipid Profile: No results for input(s): CHOL, HDL, LDLCALC, TRIG, CHOLHDL, LDLDIRECT in the last 72 hours. Thyroid Function Tests: No results for input(s): TSH, T4TOTAL, FREET4, T3FREE, THYROIDAB in the last 72 hours. Anemia Panel: No results for input(s): VITAMINB12, FOLATE, FERRITIN, TIBC, IRON, RETICCTPCT in the last 72 hours.    Radiology Studies: I have reviewed all of the imaging during  this hospital visit personally     Scheduled Meds: . aspirin EC  81 mg Oral QPM  . Chlorhexidine Gluconate Cloth  6 each Topical Daily  . enoxaparin (LOVENOX) injection  40 mg Subcutaneous Q24H  . famotidine  20 mg Oral QHS  . feeding supplement  1 Container Oral Q24H  . feeding supplement  237 mL Oral BID BM  . insulin aspart  0-9 Units Subcutaneous Q4H  . levothyroxine  88 mcg Oral Q0600  . mometasone-formoterol  2 puff Inhalation BID  . multivitamin with minerals  1 tablet Oral Daily  . predniSONE  50 mg Oral Q breakfast  . simvastatin  10 mg Oral Daily   Continuous Infusions: . ceFEPime (MAXIPIME) IV 2 g (01/18/20 0927)     LOS: 4 days        Nevin Kozuch Gerome Apley, MD

## 2020-01-18 NOTE — Progress Notes (Signed)
Daily Progress Note   Patient Name: [name]       Date: 01/18/2020 DOB: November 17, 1945  Age: 74 y.o. MRN#: [id number] Attending Physician: Tawni Millers Primary Care Physician: Shirline Frees, MD Admit Date: 01/24/2020  Reason for Consultation/Follow-up: Establishing goals of care  Subjective: Attempted to call husband this evening.  I was unable to reach him.  I called and was able to reach her daughter.  We discussed seriousness of her mother's condition with continued decline in her mental status and poor nutrition.  She expressed understanding of her situation.  Length of Stay: 4  Current Medications: Scheduled Meds:  . Chlorhexidine Gluconate Cloth  6 each Topical Daily  . enoxaparin (LOVENOX) injection  40 mg Subcutaneous Q24H  . feeding supplement  1 Container Oral Q24H  . feeding supplement  237 mL Oral BID BM  . [START ON 01/19/2020] levothyroxine  88 mcg Oral Q0600  . [START ON 01/19/2020] methylPREDNISolone (SOLU-MEDROL) injection  20 mg Intravenous Daily  . mometasone-formoterol  2 puff Inhalation BID  . multivitamin with minerals  1 tablet Oral Daily  . pantoprazole (PROTONIX) IV  40 mg Intravenous Q24H    Continuous Infusions: . ceFEPime (MAXIPIME) IV Stopped (01/18/20 1000)    PRN Meds: haloperidol **OR** haloperidol lactate, morphine injection  Physical Exam          Vital Signs: BP 118/78 (BP Location: Left Arm)   Pulse 70   Temp 98.5 F (36.9 C) (Rectal)   Resp 18   Ht 5\' 3"  (1.6 m)   Wt 64.7 kg   LMP  (LMP Unknown)   SpO2 94%   BMI 25.27 kg/m  SpO2: SpO2: 94 % O2 Device: O2 Device: Nasal Cannula O2 Flow Rate: O2 Flow Rate (L/min): 3 L/min  Intake/output summary: No intake or output data in the 24 hours ending 01/18/20  2119 LBM: Last BM Date: 01/16/20 Baseline Weight: Weight: 59 kg Most recent weight: Weight: 64.7 kg       Palliative Assessment/Data:    Flowsheet Rows     Most Recent Value  Intake Tab  Referral Department Hospitalist  Unit at Time of Referral Med/Surg Unit  Palliative Care Primary Diagnosis Sepsis/Infectious Disease  Date Notified 01/17/20  Palliative Care Type New Palliative care  Reason for referral Clarify Goals of Care  Date of Admission 01/13/2020  # of days IP prior to Palliative referral 3  Clinical Assessment  Psychosocial & Spiritual Assessment  Palliative Care Outcomes      Patient Active Problem List   Diagnosis Date Noted  . DNR (do not resuscitate) 01/18/2020  . Acute metabolic encephalopathy 17/61/6073  . Pneumonia due to COVID-19 virus 01/18/2020  . Acute respiratory distress syndrome (ARDS) due to COVID-19 virus ([place]) 01/03/2020  . Acute respiratory failure with hypoxia ([place]) 07/10/2018  . CHF exacerbation ([place]) 07/10/2018  . Acute encephalopathy 07/10/2018  . Elevated LFTs 07/10/2018  . Biventricular ICD (implantable cardioverter-defibrillator) in place 10/12/2013  . Essential hypertension 10/12/2013  . Lumbar post-laminectomy syndrome 05/23/2013  . 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 Excessive sleepiness 05/21/2012  . COPD (chronic obstructive pulmonary disease) ([place]) 05/21/2012  . Osteoarthritis of both knees 08/08/2011  . Anemia 03/21/2011  . Trigeminal neuralgia 03/21/2011  . Hyperlipidemia 03/21/2011  . CAD (coronary artery disease)   . Ischemic cardiomyopathy   . Biventricular implantable cardioverter-defibrillator in situ   . Chronic systolic heart failure [facility])     Palliative Care Assessment & Plan   Patient Profile: 74 y.o. female  with past medical history of coronary artery disease, mixed congestive heart.  EF 20 to 25%, COPD with recent hospitalization for CHF exacerbation admitted on 01/07/2020 with respiratory distress.  She  was found to be Covid positive.  Initially, concern for respiratory failure but this seems to improved in the past couple of days.  Her mental status, however, remains poor and she has been refusing to eat.  Palliative consulted for goals of care.  Recommendations/Plan:  DNR/DNI  Continue current interventions.  She has poor prognosis and will continue to progress conversation with her family based upon her clinical course.  Code Status:    Code Status Orders  (From admission, onward)         Start     Ordered   01/01/2020 1656  Do not attempt resuscitation (DNR)  Continuous       Question Answer Comment  In the event of cardiac or respiratory ARREST Do not call a "code blue"   In the event of cardiac or respiratory ARREST Do not perform Intubation, CPR, defibrillation or ACLS   In the event of cardiac or respiratory ARREST Use medication by any route, position, wound care, and other measures to relive pain and suffering. May use oxygen, suction and manual treatment of airway obstruction as needed for comfort.      01/27/2020 1655        Code Status History    Date Active Date Inactive Code Status Order ID Comments User Context   01/24/2020 1643 01/13/2020 1655 Full Code [id number]  Marshell Garfinkel, MD ED   07/10/2018 1352 07/14/2018 1543 Full Code [id number]  Lavina Hamman, MD Inpatient   05/11/2013 1856 05/11/2013 2354 Full Code [id number]  Deboraha Sprang, MD Inpatient   03/21/2011 2113 03/24/2011 1319 Full Code [id number]  Eugenie Filler, MD Inpatient   Advance Care Planning Activity       Prognosis:   Poor  Discharge Planning:  To Be Determined  Care plan was discussed with daughter  Thank you for allowing the Palliative Medicine Team to assist in the care of this patient.   Time In: 1610 Time Out: 1640 Total Time 20 Prolonged Time Billed No      Greater than 50%  of this time was spent counseling and coordinating care related to the above  assessment and  plan.  Micheline Rough, MD  Please contact Palliative Medicine Team phone at [phone] for questions and concerns.

## 2020-01-18 NOTE — Progress Notes (Signed)
Pharmacy Antibiotic Note  LETRICIA Ware is a 74 y.o. female with recent ED visit for CHF admitted on 01/23/2020 with acute respiratory failure due to COVID PNA and possible HCAP.  Pharmacy has been consulted for Cefepime dosing.  Plan: - Continue Cefepime 2g IV q12h  - Plan 5 day course per MD (stop date placed)  ____________________________________________  Height: 5\' 3"  (160 cm) Weight: 64.7 kg (142 lb 10.2 oz) IBW/kg (Calculated) : 52.4  Temp (24hrs), Avg:97.5 F (36.4 C), Min:97.5 F (36.4 C), Max:97.5 F (36.4 C)  Recent Labs  Lab 01/24/2020 1414 01/16/2020 1635 01/15/20 0255 01/15/20 1029 01/15/20 1231 01/16/20 0310 01/17/20 0420 01/18/20 0420  WBC 8.0  --   --   --   --  15.0* 15.4* 20.2*  CREATININE  --    < > 1.25*  --  1.31* 0.96 1.13* 1.10*  LATICACIDVEN 7.9*  --   --  2.1*  --   --   --   --    < > = values in this interval not displayed.    Estimated Creatinine Clearance: 40 mL/min (A) (by C-G formula based on SCr of 1.1 mg/dL (H)).    Allergies  Allergen Reactions  . Clorazepate Dipotassium Other (See Comments)    Caused confusion per husband    Antimicrobials this admission: 10/16 Cefepime >> (10/21) 10/16 Vancomycin >> 10/17    Microbiology results: 10/16 BCx: NGTD 10/16 Resp PCR: COVID +, Flu A/B - 10/16 MRSA PCR: neg  Thank you for allowing pharmacy to be a part of this patient's care.   Lindell Spar, PharmD, BCPS Clinical Pharmacist  01/18/2020 11:31 AM

## 2020-01-19 DIAGNOSIS — I255 Ischemic cardiomyopathy: Secondary | ICD-10-CM | POA: Diagnosis not present

## 2020-01-19 DIAGNOSIS — G9341 Metabolic encephalopathy: Secondary | ICD-10-CM | POA: Diagnosis not present

## 2020-01-19 DIAGNOSIS — J9601 Acute respiratory failure with hypoxia: Secondary | ICD-10-CM | POA: Diagnosis not present

## 2020-01-19 DIAGNOSIS — I5022 Chronic systolic (congestive) heart failure: Secondary | ICD-10-CM | POA: Diagnosis not present

## 2020-01-19 DIAGNOSIS — U071 COVID-19: Secondary | ICD-10-CM | POA: Diagnosis not present

## 2020-01-19 DIAGNOSIS — J9600 Acute respiratory failure, unspecified whether with hypoxia or hypercapnia: Secondary | ICD-10-CM

## 2020-01-19 DIAGNOSIS — Z7189 Other specified counseling: Secondary | ICD-10-CM | POA: Diagnosis not present

## 2020-01-19 LAB — C-REACTIVE PROTEIN: CRP: 2.7 mg/dL — ABNORMAL HIGH (ref <1.0)

## 2020-01-19 LAB — COMPREHENSIVE METABOLIC PANEL
ALT: 61 U/L — ABNORMAL HIGH (ref 0–44)
AST: 35 U/L (ref 15–41)
Albumin: 3.6 g/dL (ref 3.5–5.0)
Alkaline Phosphatase: 87 U/L (ref 38–126)
Anion gap: 14 (ref 5–15)
BUN: 50 mg/dL — ABNORMAL HIGH (ref 8–23)
CO2: 23 mmol/L (ref 22–32)
Calcium: 9.3 mg/dL (ref 8.9–10.3)
Chloride: 109 mmol/L (ref 98–111)
Creatinine, Ser: 1.05 mg/dL — ABNORMAL HIGH (ref 0.44–1.00)
GFR, Estimated: 52 mL/min — ABNORMAL LOW (ref >60)
Glucose, Bld: 138 mg/dL — ABNORMAL HIGH (ref 70–99)
Potassium: 3.5 mmol/L (ref 3.5–5.1)
Sodium: 146 mmol/L — ABNORMAL HIGH (ref 135–145)
Total Bilirubin: 1.3 mg/dL — ABNORMAL HIGH (ref 0.3–1.2)
Total Protein: 7.1 g/dL (ref 6.5–8.1)

## 2020-01-19 LAB — CULTURE, BLOOD (ROUTINE X 2)
Culture: NO GROWTH
Culture: NO GROWTH

## 2020-01-19 LAB — GLUCOSE, CAPILLARY
Glucose-Capillary: 141 mg/dL — ABNORMAL HIGH (ref 70–99)
Glucose-Capillary: 129 mg/dL — ABNORMAL HIGH (ref 70–99)
Glucose-Capillary: 120 mg/dL — ABNORMAL HIGH (ref 70–99)

## 2020-01-19 LAB — D-DIMER, QUANTITATIVE: D-Dimer, Quant: 1.24 ug/mL-FEU — ABNORMAL HIGH (ref 0.00–0.50)

## 2020-01-19 MED ORDER — MORPHINE BOLUS VIA INFUSION
2.0000 mg | INTRAVENOUS | Status: DC | PRN
  Administered 2020-01-21 – 2020-01-22 (×2): 2 mg via INTRAVENOUS
  Filled 2020-01-19: qty 2

## 2020-01-19 MED ORDER — MORPHINE SULFATE (PF) 2 MG/ML IV SOLN
2.0000 mg | INTRAVENOUS | Status: DC | PRN
  Administered 2020-01-19 (×2): 2 mg via INTRAVENOUS
  Filled 2020-01-19 (×2): qty 1

## 2020-01-19 MED ORDER — HALOPERIDOL 1 MG PO TABS
1.0000 mg | ORAL_TABLET | ORAL | Status: DC | PRN
  Filled 2020-01-19: qty 2

## 2020-01-19 MED ORDER — LORAZEPAM 2 MG/ML IJ SOLN
0.5000 mg | INTRAMUSCULAR | Status: DC | PRN
  Administered 2020-01-19: 0.5 mg via INTRAVENOUS
  Filled 2020-01-19: qty 1

## 2020-01-19 MED ORDER — GLYCOPYRROLATE 0.2 MG/ML IJ SOLN
0.2000 mg | INTRAMUSCULAR | Status: DC | PRN
  Administered 2020-01-21: 0.2 mg via INTRAVENOUS
  Filled 2020-01-19 (×3): qty 1

## 2020-01-19 MED ORDER — ONDANSETRON HCL 4 MG/2ML IJ SOLN: 4.0000 mg | Freq: Four times a day (QID) | INTRAMUSCULAR | Status: DC | PRN

## 2020-01-19 MED ORDER — HALOPERIDOL LACTATE 5 MG/ML IJ SOLN: 2.0000 mg | Freq: Once | INTRAMUSCULAR | Status: DC

## 2020-01-19 MED ORDER — HALOPERIDOL LACTATE 5 MG/ML IJ SOLN: 1.0000 mg | INTRAMUSCULAR | Status: DC | PRN

## 2020-01-19 MED ORDER — ONDANSETRON 4 MG PO TBDP: 4.0000 mg | ORAL_TABLET | Freq: Four times a day (QID) | ORAL | Status: DC | PRN

## 2020-01-19 MED ORDER — BIOTENE DRY MOUTH MT LIQD: 15.0000 mL | OROMUCOSAL | Status: DC | PRN

## 2020-01-19 MED ORDER — POLYVINYL ALCOHOL 1.4 % OP SOLN
1.0000 [drp] | Freq: Four times a day (QID) | OPHTHALMIC | Status: DC | PRN
  Filled 2020-01-19: qty 15

## 2020-01-19 MED ORDER — MORPHINE 100MG IN NS 100ML (1MG/ML) PREMIX INFUSION
6.0000 mg/h | INTRAVENOUS | Status: DC
  Administered 2020-01-19: 1 mg/h via INTRAVENOUS
  Administered 2020-01-21 – 2020-01-22 (×2): 4 mg/h via INTRAVENOUS
  Filled 2020-01-19 (×2): qty 100

## 2020-01-19 NOTE — Progress Notes (Signed)
I spoke with patient's daughter [name] about patient's condition. She is aware of poor prognosis and imminent death. Patient's husband can not make any decision due to being ill with COVID and encephalopathy. [name] and her brother agree on full comfort care to prevent any further suffering.

## 2020-01-19 NOTE — Progress Notes (Signed)
   01/19/20 0817  Assess: MEWS Score  Temp 99.3 F (37.4 C)  BP 126/70  Pulse Rate 68  Resp (!) 28  Level of Consciousness Responds to Pain  SpO2 100 %  O2 Device Non-rebreather Mask;HFNC  Patient Activity (if Appropriate) In bed  O2 Flow Rate (L/min) 15 L/min  FiO2 (%) 100 %  Assess: MEWS Score  MEWS Temp 0  MEWS Systolic 0  MEWS Pulse 0  MEWS RR 2  MEWS LOC 2  MEWS Score 4  MEWS Score Color Red  Assess: if the MEWS score is Yellow or Red  Were vital signs taken at a resting state? Yes  Focused Assessment No change from prior assessment  Early Detection of Sepsis Score *See Row Information* Low  MEWS guidelines implemented *See Row Information* No, other (Comment) (MD plan to make patient comfort care, trying to contact husb)  Treat  MEWS Interventions Other (Comment) (Contacted MD)  Pain Scale PAINAD  Pain Intervention(s) Repositioned;MD notified (Comment) (MD contacted about patient's condition)  Breathing 2  Negative Vocalization 1  Facial Expression 1  Body Language 1  Consolability 1  PAINAD Score 6  Neuro symptoms relieved by Rest  Patients response to intervention Unchanged  Take Vital Signs  Increase Vital Sign Frequency  Red: Q 1hr X 4 then Q 4hr X 4, if remains red, continue Q 4hrs  Escalate  MEWS: Escalate Red: discuss with charge nurse/RN and provider, consider discussing with RRT  Notify: Charge Nurse/RN  Name of Charge Nurse/RN Notified Durwin Nora, RN  Date Charge Nurse/RN Notified 01/19/20  Time Charge Nurse/RN Notified 0830  Notify: Provider  Provider Name/Title Dr. Jerilynn Mages. Arrient/ Dr. Betsy Coder  Date Provider Notified 01/19/20  Time Provider Notified [phone]  Notification Type Page  Notification Reason Change in status (Continuing deterioration)  Response See new orders  Date of Provider Response 01/19/20  Time of Provider Response [phone]  Document  Patient Outcome Other (Comment) (MDs contacting family to make comfort care.)  Progress note  created (see row info) Yes

## 2020-01-19 NOTE — Progress Notes (Signed)
Nurse assessed patient multiple times throughout the night. Patient would only respond to pain stimulated by repositioning. Restless and yelling for help. PRN Haldol and Morphine were given but did not appear effective. Respirations were labored with evident use of accessory muscles, as well as intermittent wheezing, grunting, and stridor present. ICU Charge Nurse came to the bedside to assess patient. No additional interventions were recommended given patient's DNR status.

## 2020-01-19 NOTE — Progress Notes (Signed)
Daily Progress Note   Patient Name: [name]       Date: 01/19/2020 DOB: 11/30/1945  Age: 74 y.o. MRN#: [id number] Attending Physician: Tawni Millers Primary Care Physician: Shirline Frees, MD Admit Date: 01/11/2020  Reason for Consultation/Follow-up: Establishing goals of care  Subjective: Received call from RN that patient looking worse with red MEWS.  I saw and examined [name].  She was unable to communication and was lying in bed with increased work of breathing and was moaning.  Attempted to call husband.  I was unable to reach him and left a voicemail.  I called and was able to reach her daughter, [name].  Discussed her clinical worsening today and that she appears to me to be transitioning to actively dying.  Discussed current interventions and care plan moving forward.  [name] reports understanding the situation and that she does not want her mother to suffer.  We discussed liberalizing medications for comfort while also continuing current interventions, at least until we have a chance to discuss with her mother's husband.  Discussed visitation at end of life.  [name]'s brother (patient son) just had heart surgery and so family is appropriately being very careful with exposures and therefore will not be coming to the hospital in person.      Length of Stay: 5  Current Medications: Scheduled Meds:  . Chlorhexidine Gluconate Cloth  6 each Topical Daily  . enoxaparin (LOVENOX) injection  40 mg Subcutaneous Q24H  . feeding supplement  1 Container Oral Q24H  . feeding supplement  237 mL Oral BID BM  . haloperidol lactate  2 mg Intravenous Once  . levothyroxine  88 mcg Oral Q0600  . methylPREDNISolone (SOLU-MEDROL) injection  20 mg Intravenous Daily  .  mometasone-formoterol  2 puff Inhalation BID  . multivitamin with minerals  1 tablet Oral Daily  . pantoprazole (PROTONIX) IV  40 mg Intravenous Q24H    Continuous Infusions: . ceFEPime (MAXIPIME) IV 2 g (01/18/20 2157)    PRN Meds: glycopyrrolate, haloperidol **OR** haloperidol lactate, LORazepam, morphine injection  Physical Exam    General: Eyes open but not tracking.  Responds to questions only with moans.  Heart: Regular rate and rhythm. No murmur appreciated. Lungs: Decreased air movement, audible wheezing Abdomen: Soft, nontender, nondistended, positive bowel sounds.  Ext: No significant  edema Skin: Warm and dry Neuro: does not follow commands.  Restless and agitated        Vital Signs: BP 126/70 (BP Location: Left Arm)   Pulse 68   Temp 99.3 F (37.4 C)   Resp (!) 28   Ht 5\' 3"  (1.6 m)   Wt 64.1 kg   LMP  (LMP Unknown)   SpO2 100%   BMI 25.03 kg/m  SpO2: SpO2: 100 % O2 Device: O2 Device: NRB, High Flow Nasal Cannula O2 Flow Rate: O2 Flow Rate (L/min): 15 L/min  Intake/output summary: No intake or output data in the 24 hours ending 01/19/20 0929 LBM: Last BM Date: 01/16/20 Baseline Weight: Weight: 59 kg Most recent weight: Weight: 64.1 kg       Palliative Assessment/Data:    Flowsheet Rows     Most Recent Value  Intake Tab  Referral Department Hospitalist  Unit at Time of Referral Med/Surg Unit  Palliative Care Primary Diagnosis Sepsis/Infectious Disease  Date Notified 01/17/20  Palliative Care Type New Palliative care  Reason for referral Clarify Goals of Care  Date of Admission 01/12/2020  # of days IP prior to Palliative referral 3  Clinical Assessment  Psychosocial & Spiritual Assessment  Palliative Care Outcomes      Patient Active Problem List   Diagnosis Date Noted  . DNR (do not resuscitate) 01/18/2020  . Acute metabolic encephalopathy 40/98/1191  . Pneumonia due to COVID-19 virus 01/18/2020  . Acute respiratory distress syndrome  (ARDS) due to COVID-19 virus ([place]) 01/24/2020  . Acute respiratory failure with hypoxia ([place]) 07/10/2018  . CHF exacerbation ([place]) 07/10/2018  . Acute encephalopathy 07/10/2018  . Elevated LFTs 07/10/2018  . Biventricular ICD (implantable cardioverter-defibrillator) in place 10/12/2013  . Essential hypertension 10/12/2013  . Lumbar post-laminectomy syndrome 05/23/2013  . 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 Excessive sleepiness 05/21/2012  . COPD (chronic obstructive pulmonary disease) ([place]) 05/21/2012  . Osteoarthritis of both knees 08/08/2011  . Anemia 03/21/2011  . Trigeminal neuralgia 03/21/2011  . Hyperlipidemia 03/21/2011  . CAD (coronary artery disease)   . Ischemic cardiomyopathy   . Biventricular implantable cardioverter-defibrillator in situ   . Chronic systolic heart failure [facility])     Palliative Care Assessment & Plan   Patient Profile: 74 y.o. female  with past medical history of coronary artery disease, mixed congestive heart.  EF 20 to 25%, COPD with recent hospitalization for CHF exacerbation admitted on 01/01/2020 with respiratory distress.  She was found to be Covid positive.  Initially, concern for respiratory failure but this seems to improved in the past couple of days.  Her mental status, however, remains poor and she has been refusing to eat.  Palliative consulted for goals of care.  Recommendations/Plan:  DNR/DNI  [name] has worsened today.  She looks to me to be transitioning to actively dying.  Left VM for husband.  Discussed with daughter who understands situation.  Plan to liberalize medications to ensure she does not suffer, but did not deescalate other interventions until we have the opportunity to discuss with husband.  At the same time, medications for comfort should not be held if needed for comfort moving forward.  Code Status:    Code Status Orders  (From admission, onward)         Start     Ordered   01/13/2020 1656  Do not attempt  resuscitation (DNR)  Continuous       Question Answer Comment  In the event of cardiac or respiratory  ARREST Do not call a "code blue"   In the event of cardiac or respiratory ARREST Do not perform Intubation, CPR, defibrillation or ACLS   In the event of cardiac or respiratory ARREST Use medication by any route, position, wound care, and other measures to relive pain and suffering. May use oxygen, suction and manual treatment of airway obstruction as needed for comfort.      01/13/2020 1655        Code Status History    Date Active Date Inactive Code Status Order ID Comments User Context   01/21/2020 1643 01/07/2020 1655 Full Code [id number]  Marshell Garfinkel, MD ED   07/10/2018 1352 07/14/2018 1543 Full Code [id number]  Lavina Hamman, MD Inpatient   05/11/2013 1856 05/11/2013 2354 Full Code [id number]  Deboraha Sprang, MD Inpatient   03/21/2011 2113 03/24/2011 1319 Full Code [id number]  Eugenie Filler, MD Inpatient   Advance Care Planning Activity       Prognosis:   Poor- likely hours to limited number of days  Discharge Planning:  Anticipated Hospital Death  Care plan was discussed with daughter, RN, Dr. Cathlean Sauer  Thank you for allowing the Palliative Medicine Team to assist in the care of this patient.   Time In: 0850 Time Out: 0930 Total Time 40 Prolonged Time Billed No      Greater than 50%  of this time was spent counseling and coordinating care related to the above assessment and plan.  Micheline Rough, MD  Please contact Palliative Medicine Team phone at [phone] for questions and concerns.

## 2020-01-19 NOTE — Progress Notes (Addendum)
PROGRESS NOTE    [name]  VOJ:[id number] DOB: 1946/01/04 DOA: 01/03/2020 PCP: Shirline Frees, MD    Brief Narrative:  [name] was admitted to the hospital with working diagnosis of acute hypoxic respiratory failure due to SARS COVID-19 viral pneumonia.  74 year old female with past medical history for coronary artery disease, systolic heart failure, and COPD who presented with worsening dyspnea, respiratory distress and altered mentation.  On her initial physical examination her blood pressure was 132/62, heart rate 69, temperature 99.5, respiratory rate 32, oxygen saturation 93% on supplemental oxygen.  Her lungs are coarse breath sounds bilaterally with scattered wheezing, heart S1-S2, present rhythmic, soft abdomen, no lower extremity edema.  Chest radiograph with dense alveolar infiltrate right upper lobe, bilateral interstitial infiltrates lower lobes and left upper lobe.   She was admitted to the intensive care unit, placed on high flow nasal cannula due to high oxygen requirements.  Received medical therapy with remdesivir, systemic corticosteroids and Tocilizumab.  Transferred to [facility] 10/18.   Her hospitalization has been complicated with metabolic encephalopathy, very poor oral intake and worsening swallow dysfunction.  Palliative care services have been consulted  Patient continue with rapid worsening encephalopathy, today she is less responsive. She has been changed to NPO. Very poor prognosis, her family has been notified. Overnight with worsening oxygen requirements up to non re-breather mask.    Added analgesics and anxiolytics to prevent further suffering. Imminent death.   Assessment & Plan:   Principal Problem:   Pneumonia due to COVID-19 virus Active Problems:   Ischemic cardiomyopathy   Chronic systolic heart failure (HCC)   Hyperlipidemia   COPD (chronic obstructive pulmonary disease) (HCC)   Essential hypertension   Acute respiratory failure  with hypoxia ([place])   DNR (do not resuscitate)   Acute metabolic encephalopathy    1.  Acute hypoxic respiratory failure due to SARS COVID-19 viral pneumonia. Sp Remdesivir #5/5, Tocilizumab #1.  RR: 28  Pulse oxymetry: 100% Fi02: 15 L/ NRBM 100% Fi02  COVID-19 Labs  Recent Labs    01/17/20 0420 01/18/20 0420 01/19/20 0352  DDIMER 0.98* 1.33* 1.24*  CRP 4.3* 3.5* 2.7*    Lab Results  Component Value Date   SARSCOV2NAA POSITIVE (A) 01/17/2020   [place] NEGATIVE 12/29/2019   [place] NOT DETECTED 07/10/2018    Patient with worsening oxygen requirements, now she is on non re-breather mask. Her mentation has deteriorated and now she is non responsive.  Unable to get any oral medications due to encephalopathy. Dose of systemic steroids has been reduced.  Continue oxymetry monitoring. Patient with imminent risk of deterioration.    2. Acute metabolic encephalopathy/ COVID 19 viral encephalopathy. Rapid decline in mentation. She has been NPO due to risk of aspiration.   Continue medical therapy with as needed morphine, hadol and lorazepam. If volus medications not able to control patient's distress, will place patient on morphine infusion. Case discussed with Dr Domingo Cocking from palliative care.   3. Acute on chronic systolic heart failure/ HTN. EF 20 to 25%, No clinical signs of decompensated heart failure.  Continue to hold on diuretic therapy for now.  4, T2DM with dyslipidemia. Glucose 138, 141, 129, 120. Not tolerating po intake or po meds. Continue to hold on insulin therapy and statins.   5. COPD with acute exacerbation. On bronchodilator therapy and systemic steroids.  Today with positive wheezing.   6. Moderate calorie protein malnutrition. Very poor prognosis, not able to have po intake due to severe encephalopathy  Patient continue to be at high risk for worsening encephalopathy and respiratory failure.   Status is: Inpatient  Remains inpatient  appropriate because:IV treatments appropriate due to intensity of illness or inability to take PO   Dispo: The patient is from: Home              Anticipated d/c is to: Home              Anticipated d/c date is: > 3 days              Patient currently is not medically stable to d/c.   DVT prophylaxis: Enoxaparin   Code Status:   DNR   Family Communication:  Unable to reach her daughter over the phone. I left a message on her voicemail.     Nutrition Status: Nutrition Problem: Increased nutrient needs Etiology: acute illness, catabolic illness ([id number] infection) Signs/Symptoms: estimated needs Interventions: Ensure Enlive (each supplement provides 350kcal and 20 grams of protein), MVI, Magic cup, Boost Breeze     Consultants:   Palliative Care     Subjective: Patient very weak and deconditioned, today she is not responsive and in positive distress. No able to communicate.   Objective: Vitals:   01/19/20 0233 01/19/20 0424 01/19/20 0500 01/19/20 0817  BP: 118/87 122/81  126/70  Pulse: 72 66  68  Resp: (!) 21 (!) 24  (!) 28  Temp: 98.1 F (36.7 C) 98.8 F (37.1 C)  99.3 F (37.4 C)  TempSrc: Axillary Axillary    SpO2: 100% 100%  100%  Weight:   64.1 kg   Height:       No intake or output data in the 24 hours ending 01/19/20 1022 Filed Weights   01/17/20 0500 01/18/20 0500 01/19/20 0500  Weight: 65.5 kg 64.7 kg 64.1 kg    Examination:   General: deconditioned and ill looking appearing, positive distress.  Neurology: Awake and alert, non focal  E ENT: positive pallor, no icterus, oral mucosa dry.  Cardiovascular: No JVD. S1-S2 present, rhythmic. No lower extremity edema. Pulmonary: positive breath sounds bilaterally, bilateral wheezing Gastrointestinal. Abdomen soft and non tender Skin. No rashes Musculoskeletal: no joint deformities     Data Reviewed: I have personally reviewed following labs and imaging studies  CBC: Recent Labs  Lab  01/02/2020 1414 01/16/20 0310 01/17/20 0420 01/18/20 0420  WBC 8.0 15.0* 15.4* 20.2*  NEUTROABS 6.7  --   --   --   HGB 11.5* 11.8* 12.0 12.9  HCT 35.3* 36.3 37.6 40.9  MCV 97.0 95.5 98.4 97.4  PLT 143* 176 220 250   Basic Metabolic Panel: Recent Labs  Lab 01/15/20 1231 01/16/20 0310 01/17/20 0420 01/18/20 0420 01/19/20 0352  NA 137 140 143 144 146*  K 3.9 3.4* 4.1 4.0 3.5  CL 97* 100 105 106 109  CO2 24 24 25 23 23   GLUCOSE 105* 112* 122* 120* 138*  BUN 30* 34* 37* 44* 50*  CREATININE 1.31* 0.96 1.13* 1.10* 1.05*  CALCIUM 8.6* 8.7* 9.0 9.2 9.3  MG  --  2.5*  --   --   --   PHOS  --  3.5  --   --   --    GFR: Estimated Creatinine Clearance: 41.7 mL/min (A) (by C-G formula based on SCr of 1.05 mg/dL (H)). Liver Function Tests: Recent Labs  Lab 01/15/20 1231 01/16/20 0310 01/17/20 0420 01/18/20 0420 01/19/20 0352  AST 105* 92* 73* 49* 35  ALT 66* 72* 77* 72*  61*  ALKPHOS 102 101 89 92 87  BILITOT 1.1 1.3* 1.2 1.1 1.3*  PROT 6.6 6.7 6.6 7.1 7.1  ALBUMIN 3.4* 3.4* 3.1* 3.5 3.6   No results for input(s): LIPASE, AMYLASE in the last 168 hours. No results for input(s): AMMONIA in the last 168 hours. Coagulation Profile: No results for input(s): INR, PROTIME in the last 168 hours. Cardiac Enzymes: No results for input(s): CKTOTAL, CKMB, CKMBINDEX, TROPONINI in the last 168 hours. BNP (last 3 results) No results for input(s): PROBNP in the last 8760 hours. HbA1C: No results for input(s): HGBA1C in the last 72 hours. CBG: Recent Labs  Lab 01/18/20 1644 01/18/20 1957 01/18/20 2310 01/19/20 0426 01/19/20 0809  GLUCAP 132* 133* 130* 141* 129*   Lipid Profile: No results for input(s): CHOL, HDL, LDLCALC, TRIG, CHOLHDL, LDLDIRECT in the last 72 hours. Thyroid Function Tests: No results for input(s): TSH, T4TOTAL, FREET4, T3FREE, THYROIDAB in the last 72 hours. Anemia Panel: No results for input(s): VITAMINB12, FOLATE, FERRITIN, TIBC, IRON, RETICCTPCT in the  last 72 hours.    Radiology Studies: I have reviewed all of the imaging during this hospital visit personally     Scheduled Meds: . Chlorhexidine Gluconate Cloth  6 each Topical Daily  . enoxaparin (LOVENOX) injection  40 mg Subcutaneous Q24H  . feeding supplement  1 Container Oral Q24H  . feeding supplement  237 mL Oral BID BM  . haloperidol lactate  2 mg Intravenous Once  . levothyroxine  88 mcg Oral Q0600  . methylPREDNISolone (SOLU-MEDROL) injection  20 mg Intravenous Daily  . mometasone-formoterol  2 puff Inhalation BID  . multivitamin with minerals  1 tablet Oral Daily  . pantoprazole (PROTONIX) IV  40 mg Intravenous Q24H   Continuous Infusions: . ceFEPime (MAXIPIME) IV 2 g (01/19/20 1013)     LOS: 5 days        Walter Grima Gerome Apley, MD

## 2020-01-19 NOTE — Progress Notes (Signed)
   01/18/20 1945  Assess: MEWS Score  Level of Consciousness Responds to Pain  Assess: MEWS Score  MEWS Temp 0  MEWS Systolic 0  MEWS Pulse 0  MEWS RR 0  MEWS LOC 2  MEWS Score 2  MEWS Score Color Yellow  Assess: if the MEWS score is Yellow or Red  Were vital signs taken at a resting state? Yes  Focused Assessment Change from prior assessment (see assessment flowsheet)  Early Detection of Sepsis Score *See Row Information* Low  MEWS guidelines implemented *See Row Information* Yes  Treat  MEWS Interventions Escalated (See documentation below)  Pain Scale Faces  Faces Pain Scale 6  Patients response to intervention Relief  Take Vital Signs  Increase Vital Sign Frequency  Yellow: Q 2hr X 2 then Q 4hr X 2, if remains yellow, continue Q 4hrs  Escalate  MEWS: Escalate Yellow: discuss with charge nurse/RN and consider discussing with provider and RRT  Notify: Charge Nurse/RN  Name of Charge Nurse/RN Notified Tom   Date Charge Nurse/RN Notified 01/18/20  Time Charge Nurse/RN Notified 1945  Document  Patient Outcome Stabilized after interventions  Progress note created (see row info) Yes

## 2020-01-20 DIAGNOSIS — U071 COVID-19: Secondary | ICD-10-CM | POA: Diagnosis not present

## 2020-01-20 DIAGNOSIS — J9601 Acute respiratory failure with hypoxia: Secondary | ICD-10-CM | POA: Diagnosis not present

## 2020-01-20 DIAGNOSIS — G9341 Metabolic encephalopathy: Secondary | ICD-10-CM | POA: Diagnosis not present

## 2020-01-20 DIAGNOSIS — I5022 Chronic systolic (congestive) heart failure: Secondary | ICD-10-CM | POA: Diagnosis not present

## 2020-01-20 NOTE — Progress Notes (Signed)
Pt switched from 11L NRB to 2L  per Dr. Nolon Lennert order. Dr. Cathlean Sauer discontinued her pulse ox. Will continue to monitor.

## 2020-01-20 NOTE — Progress Notes (Signed)
Spoke w/ Mickel Baas from [facility] about patient ICD. She informed me to place a magnet on the patient ICD and tape it in place. Secure chat sent to Dr. Cathlean Sauer to inform him that patient has not had a magnet on all night and that her ICD was still active all night. Will place magnet now.

## 2020-01-20 NOTE — Progress Notes (Signed)
Pt status update:  Pt has a comfort care order.  Pt had multiple runs a VTAC overnight.  While reviewing patients chart RN noticed that it was documented that patient had a ICD.  RN asked charge what the policy was in turning off a internal defibrillator for a patient that was on comfort care.  Charge RN was not sure.  Charge RN called [facility] nurse and [facility] nurse wasn't sure and advised RN and charge RN to call ICU charge nurse regarding this policy.  Charge RN called down to ICU charge nurse and charge ICU nurse said the policy was that we do not turn of internal defibrillators until after the patient stop breathing. ICU charge nurse advised this RN that nothing needed to be done right now.

## 2020-01-20 NOTE — Progress Notes (Signed)
PROGRESS NOTE    [name]  OBS:[id number] DOB: [birth year] DOA: 01/21/2020 PCP: Shirline Frees, MD    Brief Narrative:  [name] was admitted to the hospital with working diagnosis of acute hypoxic respiratory failure due to SARS COVID-19 viral pneumonia.  74 year old female with past medical history for coronary artery disease, systolic heart failure, and COPD who presented with worsening dyspnea, respiratory distress and altered mentation. On her initial physical examination her blood pressure was 132/62, heart rate 69, temperature 99.5, respiratory rate 32, oxygen saturation 93% on supplemental oxygen. Her lungs are coarse breath sounds bilaterally with scattered wheezing, heart S1-S2, present rhythmic, soft abdomen, no lower extremity edema.  Chest radiograph with dense alveolar infiltrate right upper lobe, bilateral interstitial infiltrates lower lobes and left upper lobe.  She was admitted to the intensive care unit, placed on high flow nasal cannula due to high oxygen requirements. Received medical therapy with remdesivir, systemic corticosteroids and Tocilizumab.  Transferred to [facility] 10/18.  Her hospitalization has been complicated with metabolic encephalopathy, very poor oral intake and worsening swallow dysfunction. Palliative care services have been consulted  Patient continue with rapid worsening encephalopathy, today she is less responsive. She has been changed to NPO. Very poor prognosis, her family has been notified. Overnight with worsening oxygen requirements up to non re-breather mask.    Added analgesics and anxiolytics to prevent further suffering. Imminent death.  Now patient on comfort care, death is imminent.    Assessment & Plan:   Principal Problem:   Pneumonia due to COVID-19 virus Active Problems:   Ischemic cardiomyopathy   Chronic systolic heart failure (HCC)   Hyperlipidemia   COPD (chronic obstructive pulmonary disease)  (HCC)   Essential hypertension   Acute respiratory failure with hypoxia ([place])   DNR (do not resuscitate)   Acute metabolic encephalopathy   1.Acute hypoxic respiratory failure due to SARS COVID-19 viral pneumonia. Sp Remdesivir #5/5, Tocilizumab #1.  Discontinue systemic steroids and non rebreather mask, continue supplemental 02 per [place] 1 to 2 L/min for comfort.  Increase morphine to 2 mg, to prevent worsening dyspnea.   2. Acute metabolic encephalopathy/ COVID 19 viral encephalopathy. Continue worsening encephalopathy.  Patient is not responsive to voice or light touch.   3. Acute on chronic systolic heart failure/ HTN. EF 20 to 25%, magnet to implanted defibrillator.   4, T2DM with dyslipidemia. On comfort measures, discontinue insulin therapy.   5. COPD with acute exacerbation. Continue comfort measures, morphine to control dyspnea.   6. Moderate calorie protein malnutrition. patient NPO.   Imminent death   Status is: Inpatient  Remains inpatient appropriate because:Inpatient level of care appropriate due to severity of illness   Dispo: The patient is from: Home              Anticipated d/c is to: Home              Anticipated d/c date is: 1 day              Patient currently is not medically stable to d/c. Patient with imminent death.    DVT prophylaxis: Comfort care  Code Status:   DNR   Family Communication:   I spoke with her daughter yesterday, outside patient's room and all questions were addressed.      Nutrition Status: Nutrition Problem: Increased nutrient needs Etiology: acute illness, catabolic illness (UTMLY-65 infection) Signs/Symptoms: estimated needs Interventions: Ensure Enlive (each supplement provides 350kcal and 20 grams of protein), MVI, Magic cup, Boost  Breeze    Consultants:   Palliative care    Subjective: Patient is non responsive, agonal breathing, on morphine drip.   Objective: Vitals:   01/19/20 2043 01/20/20 0108 01/20/20  0406 01/20/20 0500  BP: 104/79     Pulse: 65 87 63   Resp: (!) 9     Temp: 97.6 F (36.4 C)     TempSrc: Oral     SpO2: 99% 97% 96%   Weight:    63.6 kg  Height:        Intake/Output Summary (Last 24 hours) at 01/20/2020 1117 Last data filed at 01/19/2020 2004 Gross per 24 hour  Intake 5.17 ml  Output 100 ml  Net -94.83 ml   Filed Weights   01/18/20 0500 01/19/20 0500 01/20/20 0500  Weight: 64.7 kg 64.1 kg 63.6 kg    Examination:   General: ill looking appearing  Neurology: Unresponsive to voice or light touch.  E ENT:  Pallor and dry oral mucosa.  Cardiovascular: No JVD. S1-S2 present, rhythmic, no gallops, rubs, or murmurs. No lower extremity edema. Pulmonary: positive breath sounds bilaterally, poor inspiratory effort.  Gastrointestinal. Abdomen soft and non tender Skin. No rashes Musculoskeletal: no joint deformities     Data Reviewed: I have personally reviewed following labs and imaging studies  CBC: Recent Labs  Lab 01/28/2020 1414 01/16/20 0310 01/17/20 0420 01/18/20 0420  WBC 8.0 15.0* 15.4* 20.2*  NEUTROABS 6.7  --   --   --   HGB 11.5* 11.8* 12.0 12.9  HCT 35.3* 36.3 37.6 40.9  MCV 97.0 95.5 98.4 97.4  PLT 143* 176 220 563   Basic Metabolic Panel: Recent Labs  Lab 01/15/20 1231 01/16/20 0310 01/17/20 0420 01/18/20 0420 01/19/20 0352  NA 137 140 143 144 146*  K 3.9 3.4* 4.1 4.0 3.5  CL 97* 100 105 106 109  CO2 24 24 25 23 23   GLUCOSE 105* 112* 122* 120* 138*  BUN 30* 34* 37* 44* 50*  CREATININE 1.31* 0.96 1.13* 1.10* 1.05*  CALCIUM 8.6* 8.7* 9.0 9.2 9.3  MG  --  2.5*  --   --   --   PHOS  --  3.5  --   --   --    GFR: Estimated Creatinine Clearance: 41.6 mL/min (A) (by C-G formula based on SCr of 1.05 mg/dL (H)). Liver Function Tests: Recent Labs  Lab 01/15/20 1231 01/16/20 0310 01/17/20 0420 01/18/20 0420 01/19/20 0352  AST 105* 92* 73* 49* 35  ALT 66* 72* 77* 72* 61*  ALKPHOS 102 101 89 92 87  BILITOT 1.1 1.3* 1.2 1.1  1.3*  PROT 6.6 6.7 6.6 7.1 7.1  ALBUMIN 3.4* 3.4* 3.1* 3.5 3.6   No results for input(s): LIPASE, AMYLASE in the last 168 hours. No results for input(s): AMMONIA in the last 168 hours. Coagulation Profile: No results for input(s): INR, PROTIME in the last 168 hours. Cardiac Enzymes: No results for input(s): CKTOTAL, CKMB, CKMBINDEX, TROPONINI in the last 168 hours. BNP (last 3 results) No results for input(s): PROBNP in the last 8760 hours. HbA1C: No results for input(s): HGBA1C in the last 72 hours. CBG: Recent Labs  Lab 01/18/20 1957 01/18/20 2310 01/19/20 0426 01/19/20 0809 01/19/20 1122  GLUCAP 133* 130* 141* 129* 120*   Lipid Profile: No results for input(s): CHOL, HDL, LDLCALC, TRIG, CHOLHDL, LDLDIRECT in the last 72 hours. Thyroid Function Tests: No results for input(s): TSH, T4TOTAL, FREET4, T3FREE, THYROIDAB in the last 72 hours. Anemia Panel: No  results for input(s): VITAMINB12, FOLATE, FERRITIN, TIBC, IRON, RETICCTPCT in the last 72 hours.    Radiology Studies: I have reviewed all of the imaging during this hospital visit personally     Scheduled Meds:  Chlorhexidine Gluconate Cloth  6 each Topical Daily   haloperidol lactate  2 mg Intravenous Once   methylPREDNISolone (SOLU-MEDROL) injection  20 mg Intravenous Daily   pantoprazole (PROTONIX) IV  40 mg Intravenous Q24H   Continuous Infusions:  morphine 1 mg/hr (01/19/20 2004)     LOS: 6 days        Adi Seales Gerome Apley, MD

## 2020-01-21 DIAGNOSIS — G9341 Metabolic encephalopathy: Secondary | ICD-10-CM | POA: Diagnosis not present

## 2020-01-21 DIAGNOSIS — J9601 Acute respiratory failure with hypoxia: Secondary | ICD-10-CM | POA: Diagnosis not present

## 2020-01-21 DIAGNOSIS — I5022 Chronic systolic (congestive) heart failure: Secondary | ICD-10-CM | POA: Diagnosis not present

## 2020-01-21 DIAGNOSIS — U071 COVID-19: Secondary | ICD-10-CM | POA: Diagnosis not present

## 2020-01-21 NOTE — Progress Notes (Signed)
PROGRESS NOTE    [name]  IRC:[id number] DOB: July 13, 1945 DOA: 01/08/2020 PCP: Shirline Frees, MD    Brief Narrative:  [name] was admitted to the hospital with working diagnosis of acute hypoxic respiratory failure due to SARS COVID-19 viral pneumonia.  74 year old female with past medical history for coronary artery disease, systolic heart failure, and COPD who presented with worsening dyspnea, respiratory distress and altered mentation. On her initial physical examination her blood pressure was 132/62, heart rate 69, temperature 99.5, respiratory rate 32, oxygen saturation 93% on supplemental oxygen. Her lungs are coarse breath sounds bilaterally with scattered wheezing, heart S1-S2, present rhythmic, soft abdomen, no lower extremity edema.  Chest radiograph with dense alveolar infiltrate right upper lobe, bilateral interstitial infiltrates lower lobes and left upper lobe.  She was admitted to the intensive care unit, placed on high flow nasal cannula due to high oxygen requirements. Received medical therapy with remdesivir, systemic corticosteroids and Tocilizumab.  Transferred to [facility] 10/18.  Her hospitalization has been complicated with metabolic encephalopathy, very poor oral intake andworseningswallow dysfunction. Palliative care services have been consulted  Patient continue with rapid worsening encephalopathy, today she is less responsive. She has been changed to NPO. Very poor prognosis, her family has been notified. Overnight with worsening oxygen requirements up to non re-breather mask.   Added analgesics and anxiolytics to prevent further suffering. Imminent death.  Now patient on comfort care, death is imminent.    Assessment & Plan:   Principal Problem:   Pneumonia due to COVID-19 virus Active Problems:   Ischemic cardiomyopathy   Chronic systolic heart failure (HCC)   Hyperlipidemia   COPD (chronic obstructive pulmonary disease)  (HCC)   Essential hypertension   Acute respiratory failure with hypoxia ([place])   DNR (do not resuscitate)   Acute metabolic encephalopathy    1.Acute hypoxic respiratory failure due to SARS COVID-19 viral pneumonia. Sp Remdesivir #5/5, Tocilizumab #1.  Patient with agonal breathing and low respiratory rate, will decrease supplemental 02 down to 1 L/ min for comfort and will increase morphine drip to 4 mg/ HR to prevent dyspnea or distress.   2. Acute metabolic encephalopathy/ COVID 19 viral encephalopathy.Continue worsening encephalopathy.  Patient is not responsive to voice or light touch.  Continue morphine drip for comfort.   3. Acute on chronic systolic heart failure/ HTN. EF 20 to 25%,magnet to implanted defibrillator.   4, T2DM with dyslipidemia.On comfort measures, discontinue insulin therapy. No indication for capillary glucose check.   5. COPD with acute exacerbation.On comfort measures.   6. Moderate calorie protein malnutrition.patient NPO.    Imminent death.   Status is: Inpatient  Remains inpatient appropriate because:IV treatments appropriate due to intensity of illness or inability to take PO   Dispo: The patient is from: Home              Anticipated d/c is to: Home              Anticipated d/c date is: 1 day              Patient currently is not medically stable to d/c.   DVT prophylaxis: None   Code Status:   dnr   Family Communication:   daughter has talked to nursing satf today.     Nutrition Status: Nutrition Problem: Increased nutrient needs Etiology: acute illness, catabolic illness (PZWCH-85 infection) Signs/Symptoms: estimated needs Interventions: Ensure Enlive (each supplement provides 350kcal and 20 grams of protein), MVI, Magic cup, Boost Breeze  Skin Documentation:     Consultants:   Palliative care.    Subjective: Patient is not responsive positive agonal breathing,  Objective: Vitals:   01/20/20 0406  01/20/20 0500 01/20/20 1530 01/21/20 0500  BP:   115/78   Pulse: 63  (!) 46   Resp:   (!) 8   Temp:   97.8 F (36.6 C)   TempSrc:   Oral   SpO2: 96%  (!) 87%   Weight:  63.6 kg  63.1 kg  Height:        Intake/Output Summary (Last 24 hours) at 01/21/2020 0941 Last data filed at 01/20/2020 1633 Gross per 24 hour  Intake 0 ml  Output --  Net 0 ml   Filed Weights   01/19/20 0500 01/20/20 0500 01/21/20 0500  Weight: 64.1 kg 63.6 kg 63.1 kg    Examination:   General: agonal breathing, accessory muscle use.  Neurology: not responsive. Agonal breathing.  E ENT: pale with dry oral mucosa.  Cardiovascular: No JVD. S1-S2 present, rhythmic, no gallops, rubs, or murmurs. No lower extremity edema. Pulmonary: positive breath sounds bilaterally, with poor inspiratory effort.  Gastrointestinal. Abdomen soft and non tender.  Skin. No rashes Musculoskeletal: no joint deformities     Data Reviewed: I have personally reviewed following labs and imaging studies  CBC: Recent Labs  Lab 01/11/2020 1414 01/16/20 0310 01/17/20 0420 01/18/20 0420  WBC 8.0 15.0* 15.4* 20.2*  NEUTROABS 6.7  --   --   --   HGB 11.5* 11.8* 12.0 12.9  HCT 35.3* 36.3 37.6 40.9  MCV 97.0 95.5 98.4 97.4  PLT 143* 176 220 470   Basic Metabolic Panel: Recent Labs  Lab 01/15/20 1231 01/16/20 0310 01/17/20 0420 01/18/20 0420 01/19/20 0352  NA 137 140 143 144 146*  K 3.9 3.4* 4.1 4.0 3.5  CL 97* 100 105 106 109  CO2 24 24 25 23 23   GLUCOSE 105* 112* 122* 120* 138*  BUN 30* 34* 37* 44* 50*  CREATININE 1.31* 0.96 1.13* 1.10* 1.05*  CALCIUM 8.6* 8.7* 9.0 9.2 9.3  MG  --  2.5*  --   --   --   PHOS  --  3.5  --   --   --    GFR: Estimated Creatinine Clearance: 41.4 mL/min (A) (by C-G formula based on SCr of 1.05 mg/dL (H)). Liver Function Tests: Recent Labs  Lab 01/15/20 1231 01/16/20 0310 01/17/20 0420 01/18/20 0420 01/19/20 0352  AST 105* 92* 73* 49* 35  ALT 66* 72* 77* 72* 61*  ALKPHOS 102 101  89 92 87  BILITOT 1.1 1.3* 1.2 1.1 1.3*  PROT 6.6 6.7 6.6 7.1 7.1  ALBUMIN 3.4* 3.4* 3.1* 3.5 3.6   No results for input(s): LIPASE, AMYLASE in the last 168 hours. No results for input(s): AMMONIA in the last 168 hours. Coagulation Profile: No results for input(s): INR, PROTIME in the last 168 hours. Cardiac Enzymes: No results for input(s): CKTOTAL, CKMB, CKMBINDEX, TROPONINI in the last 168 hours. BNP (last 3 results) No results for input(s): PROBNP in the last 8760 hours. HbA1C: No results for input(s): HGBA1C in the last 72 hours. CBG: Recent Labs  Lab 01/18/20 1957 01/18/20 2310 01/19/20 0426 01/19/20 0809 01/19/20 1122  GLUCAP 133* 130* 141* 129* 120*   Lipid Profile: No results for input(s): CHOL, HDL, LDLCALC, TRIG, CHOLHDL, LDLDIRECT in the last 72 hours. Thyroid Function Tests: No results for input(s): TSH, T4TOTAL, FREET4, T3FREE, THYROIDAB in the last 72 hours. Anemia  Panel: No results for input(s): VITAMINB12, FOLATE, FERRITIN, TIBC, IRON, RETICCTPCT in the last 72 hours.    Radiology Studies: I have reviewed all of the imaging during this hospital visit personally     Scheduled Meds: . Chlorhexidine Gluconate Cloth  6 each Topical Daily  . haloperidol lactate  2 mg Intravenous Once   Continuous Infusions: . morphine 2 mg/hr (01/20/20 1321)     LOS: 7 days        Tkeya Stencil Gerome Apley, MD

## 2020-01-22 DIAGNOSIS — J438 Other emphysema: Secondary | ICD-10-CM | POA: Diagnosis not present

## 2020-01-22 DIAGNOSIS — J9601 Acute respiratory failure with hypoxia: Secondary | ICD-10-CM | POA: Diagnosis not present

## 2020-01-22 DIAGNOSIS — U071 COVID-19: Secondary | ICD-10-CM | POA: Diagnosis not present

## 2020-01-22 DIAGNOSIS — G9341 Metabolic encephalopathy: Secondary | ICD-10-CM | POA: Diagnosis not present

## 2020-01-22 MED ORDER — LIP MEDEX EX OINT
TOPICAL_OINTMENT | CUTANEOUS | Status: DC | PRN
  Filled 2020-01-22: qty 7

## 2020-01-30 NOTE — Progress Notes (Signed)
Pt transferred to Morgue.  

## 2020-01-30 NOTE — Death Summary Note (Signed)
Death Summary  [name] NLZ:[id number] DOB: 01-29-1946 DOA: 02/09/20  PCP: Shirline Frees, MD  Admit date: 09-Feb-2020 Date of Death: 02/17/2020 Time of Death: 13:40  Notification: Shirline Frees, MD notified of death of 17-Feb-2020   History of present illness:  [name] is a 74 y.o. female with a history of coronary artery disease, systolic heart failure, and COPD  [place] presented with complaint of worsening dyspnea [place] did not improve after aggressive medical therapy.   [name] was admitted to the hospital with working diagnosis of acute hypoxic respiratory failure due to SARS COVID-19 viral pneumonia.  74 year old female with past medical history for coronary artery disease, systolic heart failure, and COPD who presented with worsening dyspnea, respiratory distress and altered mentation. On her initial physical examination her blood pressure was 132/62, heart rate 69, temperature 99.5, respiratory rate 32, oxygen saturation 93% on supplemental oxygen. Her lungs had coarse breath sounds bilaterally with scattered wheezing, heart S1-S2, present rhythmic, soft abdomen, no lower extremity edema.  Chest radiograph with dense alveolar infiltrate right upper lobe, bilateral interstitial infiltrates lower lobes and left upper lobe.  She was admitted to the intensive care unit, placed on high flow nasal cannula due to high oxygen requirements. Received medical therapy with remdesivir, systemic corticosteroids and Tocilizumab.  Transferred to [facility] 10/18.  Her hospitalization has been complicated with metabolic encephalopathy, very poor oral intake andworseningswallow dysfunction. Palliative care services were consulted  Patient continue with rapid worsening encephalopathy, with rapid worsening oxygen requirements.    She was determined to be very poor prognosis, patient with severe encephalopathy and respiratory failure.   Her  family decided to continue care under comfort measures and she was placed on analgesics and anxiolytics to prevent further suffering.   Final Diagnoses:  1.Acute hypoxic respiratory failure due to SARS COVID-19 viral pneumonia.  2. Acute metabolic encephalopathy/ COVID 19 viral encephalopathy. 3. Acute on chronic systolic heart failure/ HTN.  4, T2DM with dyslipidemia. 5. COPD with acute exacerbation. 6. Moderate calorie protein malnutrition..    The results of significant diagnostics from this hospitalization (including imaging, microbiology, ancillary and laboratory) are listed below for reference.    Significant Diagnostic Studies: DG Chest 2 View  Result Date: 01/01/2020 CLINICAL DATA:  Shortness of breath EXAM: CHEST - 2 VIEW COMPARISON:  Chest x-ray 12/29/2019, CT chest 07/10/2018 CT FINDINGS: Left chest wall cardiac pacemaker and defibrillator in similar position. The heart size and mediastinal contours are unchanged. Slightly improved though persistent prominence of the hilar vasculature. Biapical pleural/pulmonary scarring. Almost complete resolution of bilateral airspace opacities with persistent retrocardiac opacity. Mild pulmonary edema. No pleural effusion. No pneumothorax. No acute osseous abnormality. Lumbar surgical hardware partially visualized. IMPRESSION: 1. Almost complete resolution of bilateral airspace opacities with persistent retrocardiac opacity. 2. Mild pulmonary edema. Electronically Signed   By: Iven Finn M.D.   On: 01/01/2020 21:38   DG Chest 2 View  Result Date: 12/29/2019 CLINICAL DATA:  Shortness of breath. EXAM: CHEST - 2 VIEW COMPARISON:  December 27, 2019. FINDINGS: Stable cardiomegaly with increased central pulmonary vascular congestion. No pneumothorax or pleural effusion is noted. Left-sided pacemaker is unchanged in position. Possible patchy airspace opacities are noted bilaterally suggesting multifocal pneumonia. Bony thorax is  unremarkable. IMPRESSION: Stable cardiomegaly with increased central pulmonary vascular congestion. Possible patchy airspace opacities are noted bilaterally suggesting multifocal pneumonia. Electronically Signed   By: Marijo Conception M.D.   On: 12/29/2019 10:25   DG Chest [facility]  1 View  Result Date: 01/16/2020 CLINICAL DATA:  Acute respiratory failure.  COVID positive EXAM: PORTABLE CHEST 1 VIEW COMPARISON:  01/21/2020 FINDINGS: Cardiac pacemaker. Diffuse cardiac enlargement. Prominent left pulmonary outflow tract. Calcified and tortuous aorta. Bilateral perihilar and basilar infiltrates are similar to previous study and likely represent changes due to multifocal pneumonia. No pleural effusions. No pneumothorax. IMPRESSION: Persistent bilateral perihilar and basilar infiltrates. Electronically Signed   By: Lucienne Capers M.D.   On: 01/16/2020 05:51   DG Chest Port 1 View  Result Date: 01/21/2020 CLINICAL DATA:  Shortness of breath EXAM: PORTABLE CHEST 1 VIEW COMPARISON:  January 01, 2020 FINDINGS: There is extensive airspace opacity in both mid and lower lung regions, more on the right than on the left. More subtle opacity noted in the right upper lobe. There is cardiomegaly with central pulmonary vascular prominence and peripheral tapering consistent with pulmonary arterial hypertension. Pacemaker leads attached to right atrium, right ventricle, and coronary sinus. No adenopathy appreciable by radiography. No bone lesions. There is aortic atherosclerosis. IMPRESSION: Cardiomegaly with pulmonary arterial hypertension. Question a degree of cor pulmonale. Widespread airspace opacity bilaterally, likely representing multifocal pneumonia. Aspiration could present similarly. Both aspiration and pneumonia may present concurrently. Aortic Atherosclerosis (ICD10-I70.0). Electronically Signed   By: Lowella Grip III M.D.   On: 01/19/2020 12:22   DG Chest Port 1 View  Result Date: 12/27/2019 CLINICAL DATA:   Leg swelling. EXAM: PORTABLE CHEST 1 VIEW COMPARISON:  December 16, 2019. FINDINGS: Stable cardiomegaly. Left-sided pacemaker is unchanged in position. No pneumothorax or pleural effusion is noted. Lungs are clear. Bony thorax is unremarkable. IMPRESSION: No active disease. Electronically Signed   By: Marijo Conception M.D.   On: 12/27/2019 11:33    Microbiology: Recent Results (from the past 240 hour(s))  Respiratory Panel by RT PCR (Flu A&B, Covid) - Nasopharyngeal Swab     Status: Abnormal   Collection Time: 01/16/2020 11:40 AM   Specimen: Nasopharyngeal Swab  Result Value Ref Range Status   SARS Coronavirus 2 by RT PCR POSITIVE (A) NEGATIVE Final    Comment: (NOTE) SARS-CoV-2 target nucleic acids are DETECTED.  SARS-CoV-2 RNA is generally detectable in upper respiratory specimens  during the acute phase of infection. Positive results are indicative of the presence of the identified virus, but do not rule out bacterial infection or co-infection with other pathogens not detected by the test. Clinical correlation with patient history and other diagnostic information is necessary to determine patient infection status. The expected result is Negative.  Fact Sheet for Patients:  PinkCheek.be  Fact Sheet for Healthcare Providers: GravelBags.it  This test is not yet approved or cleared by the [place] FDA and  has been authorized for detection and/or diagnosis of SARS-CoV-2 by FDA under an Emergency Use Authorization (EUA).  This EUA will remain in effect (meaning this test can be used) for the duration of  the COVID-19 declaration under Section 564(b)(1) of the Act , 21 U.S.C. section 360bbb-3(b)(1), unless the authorization is terminated or revoked sooner.      Influenza A by PCR NEGATIVE NEGATIVE Final   Influenza B by PCR NEGATIVE NEGATIVE Final    Comment: (NOTE) The Xpert Xpress SARS-CoV-2/FLU/RSV assay is intended  as an aid in  the diagnosis of influenza from Nasopharyngeal swab specimens and  should not be used as a sole basis for treatment. Nasal washings and  aspirates are unacceptable for Xpert Xpress SARS-CoV-2/FLU/RSV  testing.  Fact Sheet for Patients: PinkCheek.be  Fact Sheet  for Healthcare Providers: GravelBags.it  This test is not yet approved or cleared by the [place] and  has been authorized for detection and/or diagnosis of SARS-CoV-2 by  FDA under an Emergency Use Authorization (EUA). This EUA will remain  in effect (meaning this test can be used) for the duration of the  Covid-19 declaration under Section 564(b)(1) of the Act, 21  U.S.C. section 360bbb-3(b)(1), unless the authorization is  terminated or revoked. Performed at [facility], [place] [address] [place], [place] [address]   Culture, blood (Routine X 2) w Reflex to ID Panel     Status: None   Collection Time: 01/26/2020  2:14 PM   Specimen: BLOOD LEFT HAND  Result Value Ref Range Status   Specimen Description   Final    BLOOD LEFT HAND Performed at [facility], [place] [address] [place], [place] [address]    Special Requests   Final    BOTTLES DRAWN AEROBIC ONLY Blood Culture results may not be optimal due to an inadequate volume of blood received in culture bottles Performed at [place] [address], [place], [place] [address]    Culture   Final    NO GROWTH 5 DAYS Performed at [facility], [place] [address] [place], [place] [address]    Report Status 01/19/2020 FINAL  Final  Culture, blood (Routine X 2) w Reflex to ID Panel     Status: None   Collection Time: 01/27/2020  2:14 PM   Specimen: BLOOD  Result Value Ref Range Status   Specimen Description   Final    BLOOD RIGHT ANTECUBITAL Performed at [place] [address], [place], [place] [address]    Special  Requests   Final    BOTTLES DRAWN AEROBIC AND ANAEROBIC Blood Culture results may not be optimal due to an inadequate volume of blood received in culture bottles Performed at [place] [address], [place], [place] [address]    Culture   Final    NO GROWTH 5 DAYS Performed at [facility], [place] [address] [place], [place] [address]    Report Status 01/19/2020 FINAL  Final  MRSA PCR Screening     Status: None   Collection Time: 01/23/2020  5:38 PM   Specimen: Nasopharyngeal  Result Value Ref Range Status   MRSA by PCR NEGATIVE NEGATIVE Final    Comment:        The GeneXpert MRSA Assay (FDA approved for NASAL specimens only), is one component of a comprehensive MRSA colonization surveillance program. It is not intended to diagnose MRSA infection nor to guide or monitor treatment for MRSA infections. Performed at [facility], [place] [address], [place],  [address]      Labs: Basic Metabolic Panel: Recent Labs  Lab 01/16/20 0310 01/16/20 0310 01/17/20 0420 01/17/20 0420 01/18/20 0420 01/19/20 0352  NA 140  --  143  --  144 146*  K 3.4*   < > 4.1   < > 4.0 3.5  CL 100  --  105  --  106 109  CO2 24  --  25  --  23 23  GLUCOSE 112*  --  122*  --  120* 138*  BUN 34*  --  37*  --  44* 50*  CREATININE 0.96  --  1.13*  --  1.10* 1.05*  CALCIUM 8.7*  --  9.0  --  9.2 9.3  MG 2.5*  --   --   --   --   --   PHOS 3.5  --   --   --   --   --    < > = values in this interval not displayed.   Liver Function Tests: Recent Labs  Lab 01/16/20 0310 01/17/20 0420 01/18/20 0420 01/19/20 0352  AST 92* 73* 49* 35  ALT 72* 77* 72* 61*  ALKPHOS 101 89 92 87  BILITOT 1.3* 1.2 1.1 1.3*  PROT 6.7 6.6 7.1 7.1  ALBUMIN 3.4* 3.1* 3.5 3.6   No results for input(s): LIPASE, AMYLASE in the last 168 hours. No results for input(s): AMMONIA in the last 168 hours. CBC: Recent Labs  Lab 01/16/20 0310 01/17/20 0420 01/18/20 0420  WBC  15.0* 15.4* 20.2*  HGB 11.8* 12.0 12.9  HCT 36.3 37.6 40.9  MCV 95.5 98.4 97.4  PLT 176 220 250   Cardiac Enzymes: No results for input(s): CKTOTAL, CKMB, CKMBINDEX, TROPONINI in the last 168 hours. D-Dimer No results for input(s): DDIMER in the last 72 hours. BNP: Invalid input(s): POCBNP CBG: Recent Labs  Lab 01/18/20 1957 01/18/20 2310 01/19/20 0426 01/19/20 0809 01/19/20 1122  GLUCAP 133* 130* 141* 129* 120*   Anemia work up No results for input(s): VITAMINB12, FOLATE, FERRITIN, TIBC, IRON, RETICCTPCT in the last 72 hours. Urinalysis    Component Value Date/Time   COLORURINE COLORLESS (A) 07/10/2018 0719   APPEARANCEUR CLEAR 07/10/2018 0719   LABSPEC 1.005 07/10/2018 0719   PHURINE 6.0 07/10/2018 0719   GLUCOSEU NEGATIVE 07/10/2018 0719   HGBUR SMALL (A) 07/10/2018 0719   BILIRUBINUR NEGATIVE 07/10/2018 0719   KETONESUR NEGATIVE 07/10/2018 0719   PROTEINUR NEGATIVE 07/10/2018 0719   UROBILINOGEN 1.0 03/22/2011 2007   NITRITE NEGATIVE 07/10/2018 0719   LEUKOCYTESUR TRACE (A) 07/10/2018 0719   Sepsis Labs Invalid input(s): PROCALCITONIN,  WBC,  LACTICIDVEN     SIGNED:  Tawni Millers, MD  [facility] 02-11-20, 3:27 PM Pager   If 7PM-7AM, please contact night-coverage [email or web address] Password [email or web address]

## 2020-01-30 NOTE — Progress Notes (Signed)
Pt has expired. MD aware. Pronouncement of death by Thornton Papas, RN and Erin Sons, RN at [phone]. Daughter, [name], has been called. Pt's spouse is in [place] and has now been made aware.

## 2020-01-30 NOTE — Progress Notes (Signed)
PROGRESS NOTE    [name]  VEH:[id number] DOB: [birth year] DOA: 01/26/2020 PCP: Shirline Frees, MD    Brief Narrative:  [name] was admitted to the hospital with working diagnosis of acute hypoxic respiratory failure due to SARS COVID-19 viral pneumonia.  74 year old female with past medical history for coronary artery disease, systolic heart failure, and COPD who presented with worsening dyspnea, respiratory distress and altered mentation. On her initial physical examination her blood pressure was 132/62, heart rate 69, temperature 99.5, respiratory rate 32, oxygen saturation 93% on supplemental oxygen. Her lungs are coarse breath sounds bilaterally with scattered wheezing, heart S1-S2, present rhythmic, soft abdomen, no lower extremity edema.  Chest radiograph with dense alveolar infiltrate right upper lobe, bilateral interstitial infiltrates lower lobes and left upper lobe.  She was admitted to the intensive care unit, placed on high flow nasal cannula due to high oxygen requirements. Received medical therapy with remdesivir, systemic corticosteroids and Tocilizumab.  Transferred to [facility] 10/18.  Her hospitalization has been complicated with metabolic encephalopathy, very poor oral intake andworseningswallow dysfunction. Palliative care services have been consulted  Patient continue with rapid worsening encephalopathy, today she is less responsive. She has been changed to NPO. Very poor prognosis, her family has been notified. Overnight with worsening oxygen requirements up to non re-breather mask.   Added analgesics and anxiolytics to prevent further suffering. Imminent death.  Now patient on comfort care, death is imminent.   Assessment & Plan:   Principal Problem:   Pneumonia due to COVID-19 virus Active Problems:   Ischemic cardiomyopathy   Chronic systolic heart failure (HCC)   Hyperlipidemia   COPD (chronic obstructive pulmonary disease)  (HCC)   Essential hypertension   Acute respiratory failure with hypoxia ([place])   DNR (do not resuscitate)   Acute metabolic encephalopathy    1.Acute hypoxic respiratory failure due to SARS COVID-19 viral pneumonia. Sp Remdesivir #5/5, Tocilizumab #1.  Keep on 1 L/min per [place] for comfort care, patient with agonal breathing and intermittent use of accessory muscles, (abdominal muscles), will increase morphine drip to 6 mg/H for comfort care and prevent any further respiratory distress.   2. Acute metabolic encephalopathy/ COVID 19 viral encephalopathy.Continue worsening encephalopathy.  Patient is not responsive to voice or light touch.  Continue to titrate morphine drip for comfort.   3. Acute on chronic systolic heart failure/ HTN. EF 20 to 25%,magnet to implanted defibrillator.  4, T2DM with dyslipidemia.On comfort measures, discontinue insulin therapy.No indication for capillary glucose check.  Not able to eat.   5. COPD with acute exacerbation.Continue with comfort measures.   6. Moderate calorie protein malnutrition.NPO.    Imminent death.    Status is: Inpatient  Remains inpatient appropriate because:IV treatments appropriate due to intensity of illness or inability to take PO   Dispo: The patient is from: Home              Anticipated d/c is to: Home              Anticipated d/c date is: 1 day              Patient currently is not medically stable to d/c. Imminent death, patient on comfort measures.    DVT prophylaxis: NA    Code Status:    DNR  Family Communication:       Nutrition Status: Nutrition Problem: Increased nutrient needs Etiology: acute illness, catabolic illness ([id number] infection) Signs/Symptoms: estimated needs Interventions: Ensure Enlive (each supplement provides 350kcal and 20  grams of protein), MVI, Magic cup, Boost Breeze     Consultants:  Palliative care.   Subjective: Patient continue ill looking appearing,  not responsive, with intermittent use of respiratory abdominal muscle use.   Objective: Vitals:   01/21/20 1956 01/21/20 2028 2020/02/09 0543 02-09-20 1132  BP:  113/62    Pulse:  (!) 54    Resp:  (!) 7    Temp:      TempSrc:      SpO2: (!) 89% 96%  (!) 82%  Weight:   61.1 kg   Height:       No intake or output data in the 24 hours ending 02/09/2020 1230 Filed Weights   01/20/20 0500 01/21/20 0500 09-Feb-2020 0543  Weight: 63.6 kg 63.1 kg 61.1 kg    Examination:   General: deconditioned and ill looking appearing  Neurology: not responsive  E ENT: positive pallor, no icterus, oral mucosa dry Cardiovascular: No JVD. S1-S2 present, rhythmic, no gallops, rubs, or murmurs. No lower extremity edema. Pulmonary: positive breath sounds bilaterally, poor inspiratory effort Gastrointestinal. Abdomen soft Skin. No rashes Musculoskeletal: no joint deformities     Data Reviewed: I have personally reviewed following labs and imaging studies  CBC: Recent Labs  Lab 01/16/20 0310 01/17/20 0420 01/18/20 0420  WBC 15.0* 15.4* 20.2*  HGB 11.8* 12.0 12.9  HCT 36.3 37.6 40.9  MCV 95.5 98.4 97.4  PLT 176 220 119   Basic Metabolic Panel: Recent Labs  Lab 01/15/20 1231 01/16/20 0310 01/17/20 0420 01/18/20 0420 01/19/20 0352  NA 137 140 143 144 146*  K 3.9 3.4* 4.1 4.0 3.5  CL 97* 100 105 106 109  CO2 24 24 25 23 23   GLUCOSE 105* 112* 122* 120* 138*  BUN 30* 34* 37* 44* 50*  CREATININE 1.31* 0.96 1.13* 1.10* 1.05*  CALCIUM 8.6* 8.7* 9.0 9.2 9.3  MG  --  2.5*  --   --   --   PHOS  --  3.5  --   --   --    GFR: Estimated Creatinine Clearance: 38.3 mL/min (A) (by C-G formula based on SCr of 1.05 mg/dL (H)). Liver Function Tests: Recent Labs  Lab 01/15/20 1231 01/16/20 0310 01/17/20 0420 01/18/20 0420 01/19/20 0352  AST 105* 92* 73* 49* 35  ALT 66* 72* 77* 72* 61*  ALKPHOS 102 101 89 92 87  BILITOT 1.1 1.3* 1.2 1.1 1.3*  PROT 6.6 6.7 6.6 7.1 7.1  ALBUMIN 3.4* 3.4* 3.1* 3.5  3.6   No results for input(s): LIPASE, AMYLASE in the last 168 hours. No results for input(s): AMMONIA in the last 168 hours. Coagulation Profile: No results for input(s): INR, PROTIME in the last 168 hours. Cardiac Enzymes: No results for input(s): CKTOTAL, CKMB, CKMBINDEX, TROPONINI in the last 168 hours. BNP (last 3 results) No results for input(s): PROBNP in the last 8760 hours. HbA1C: No results for input(s): HGBA1C in the last 72 hours. CBG: Recent Labs  Lab 01/18/20 1957 01/18/20 2310 01/19/20 0426 01/19/20 0809 01/19/20 1122  GLUCAP 133* 130* 141* 129* 120*   Lipid Profile: No results for input(s): CHOL, HDL, LDLCALC, TRIG, CHOLHDL, LDLDIRECT in the last 72 hours. Thyroid Function Tests: No results for input(s): TSH, T4TOTAL, FREET4, T3FREE, THYROIDAB in the last 72 hours. Anemia Panel: No results for input(s): VITAMINB12, FOLATE, FERRITIN, TIBC, IRON, RETICCTPCT in the last 72 hours.    Radiology Studies: I have reviewed all of the imaging during this hospital visit personally  Scheduled Meds: . Chlorhexidine Gluconate Cloth  6 each Topical Daily  . haloperidol lactate  2 mg Intravenous Once   Continuous Infusions: . morphine 4 mg/hr (02-21-2020 0659)     LOS: 8 days        Lexa Coronado Gerome Apley, MD

## 2020-01-30 DEATH — deceased

## 2020-03-22 IMAGING — DX PORTABLE CHEST - 1 VIEW
1 series · 1 of 1 positions shown · non-contrast
Comparison: 05/28/2018

CLINICAL DATA: Shortness of breath.

EXAM:
PORTABLE CHEST 1 VIEW

[chest ap]
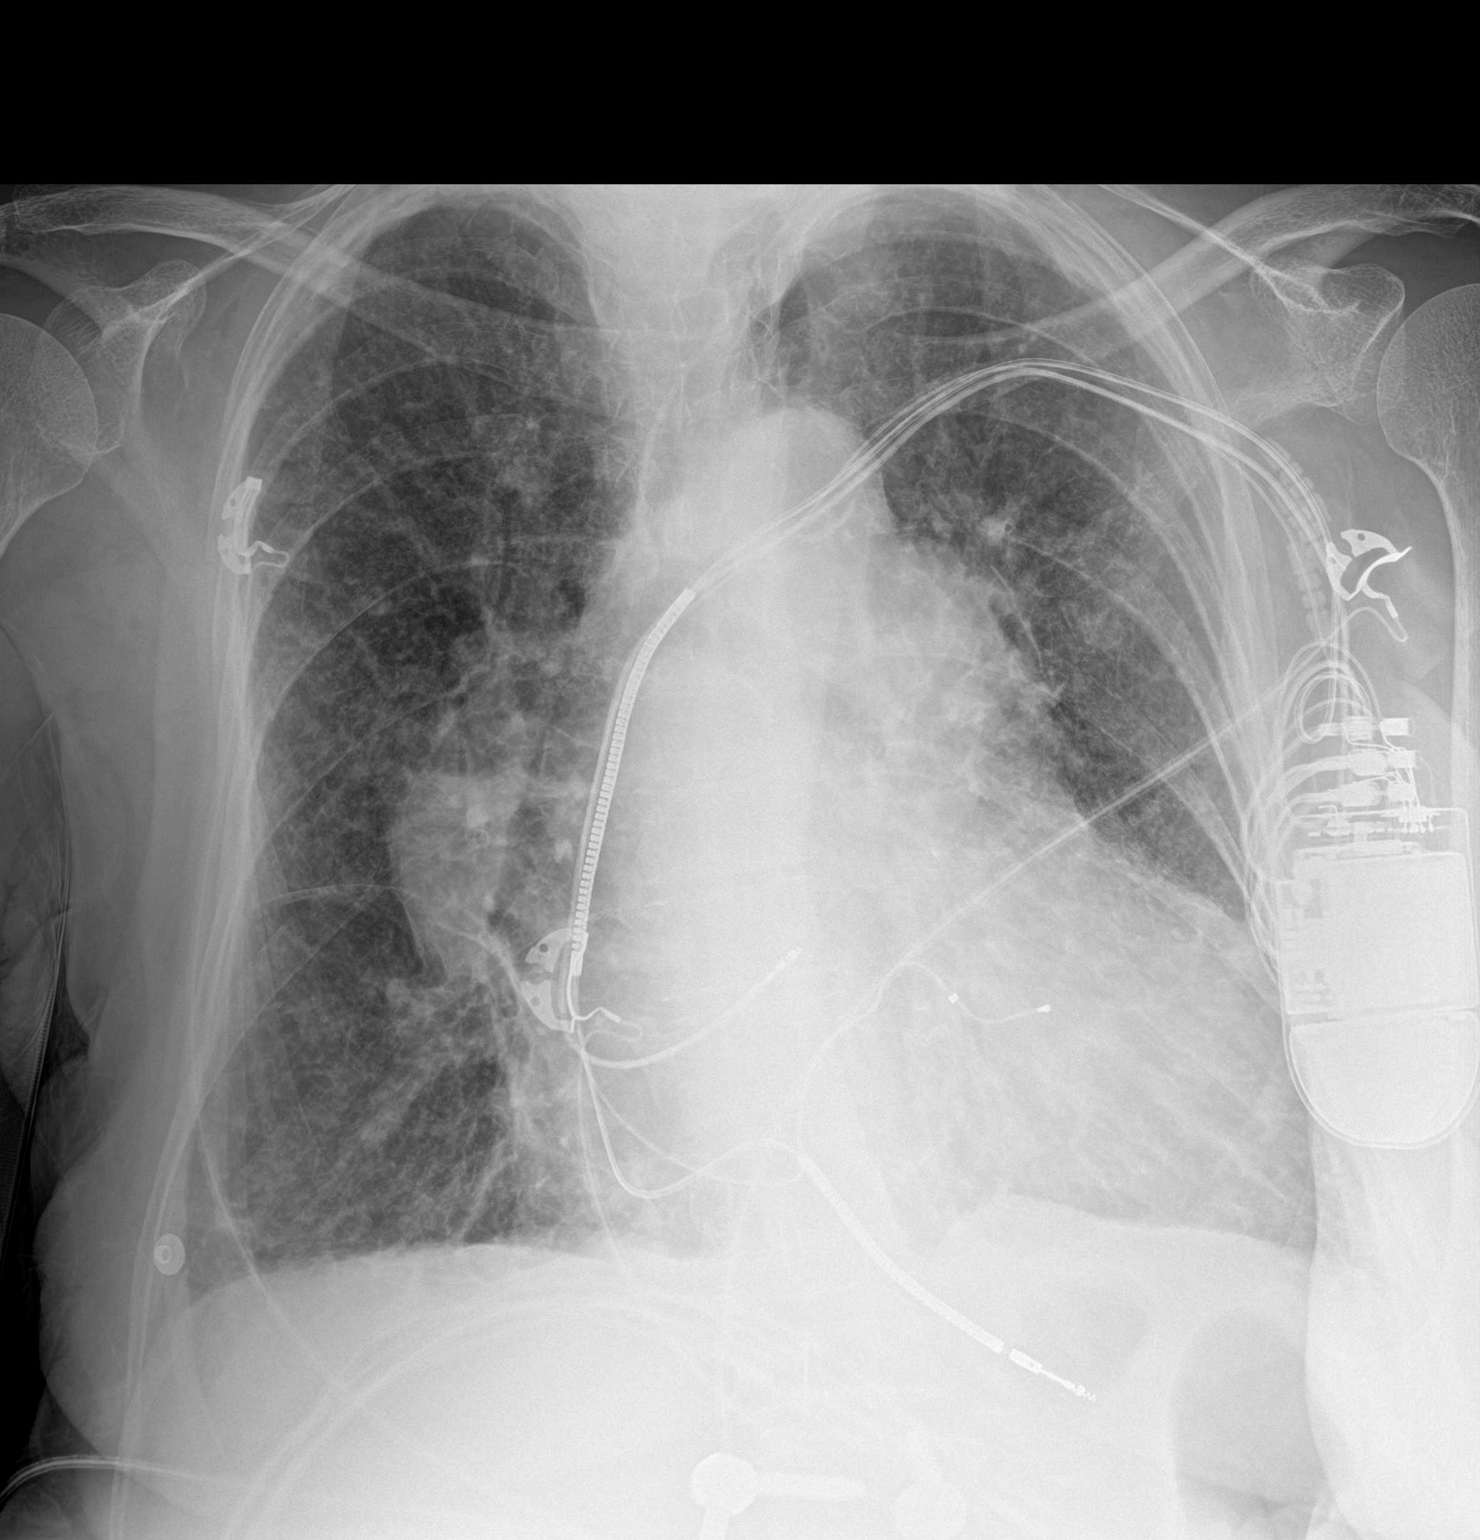

[1 of 1 positions shown; findings below may reference images not displayed]

FINDINGS: Left-sided pacemaker in place. Chronic cardiomegaly with unchanged
mediastinal contours. Bilateral hilar prominence which is chronic.
Diffuse interstitial opacities no evidence of focal airspace
disease, large pleural effusion or pneumothorax. Biapical
pleuroparenchymal scarring.
IMPRESSION: Chronic cardiomegaly. Diffuse interstitial opacities likely
represent pulmonary edema.

## 2021-09-26 IMAGING — DX DG CHEST 1V PORT
1 series · 1 of 1 positions shown · non-contrast
Comparison: January 01, 2020

CLINICAL DATA: Shortness of breath

EXAM:
PORTABLE CHEST 1 VIEW

[chest ap]
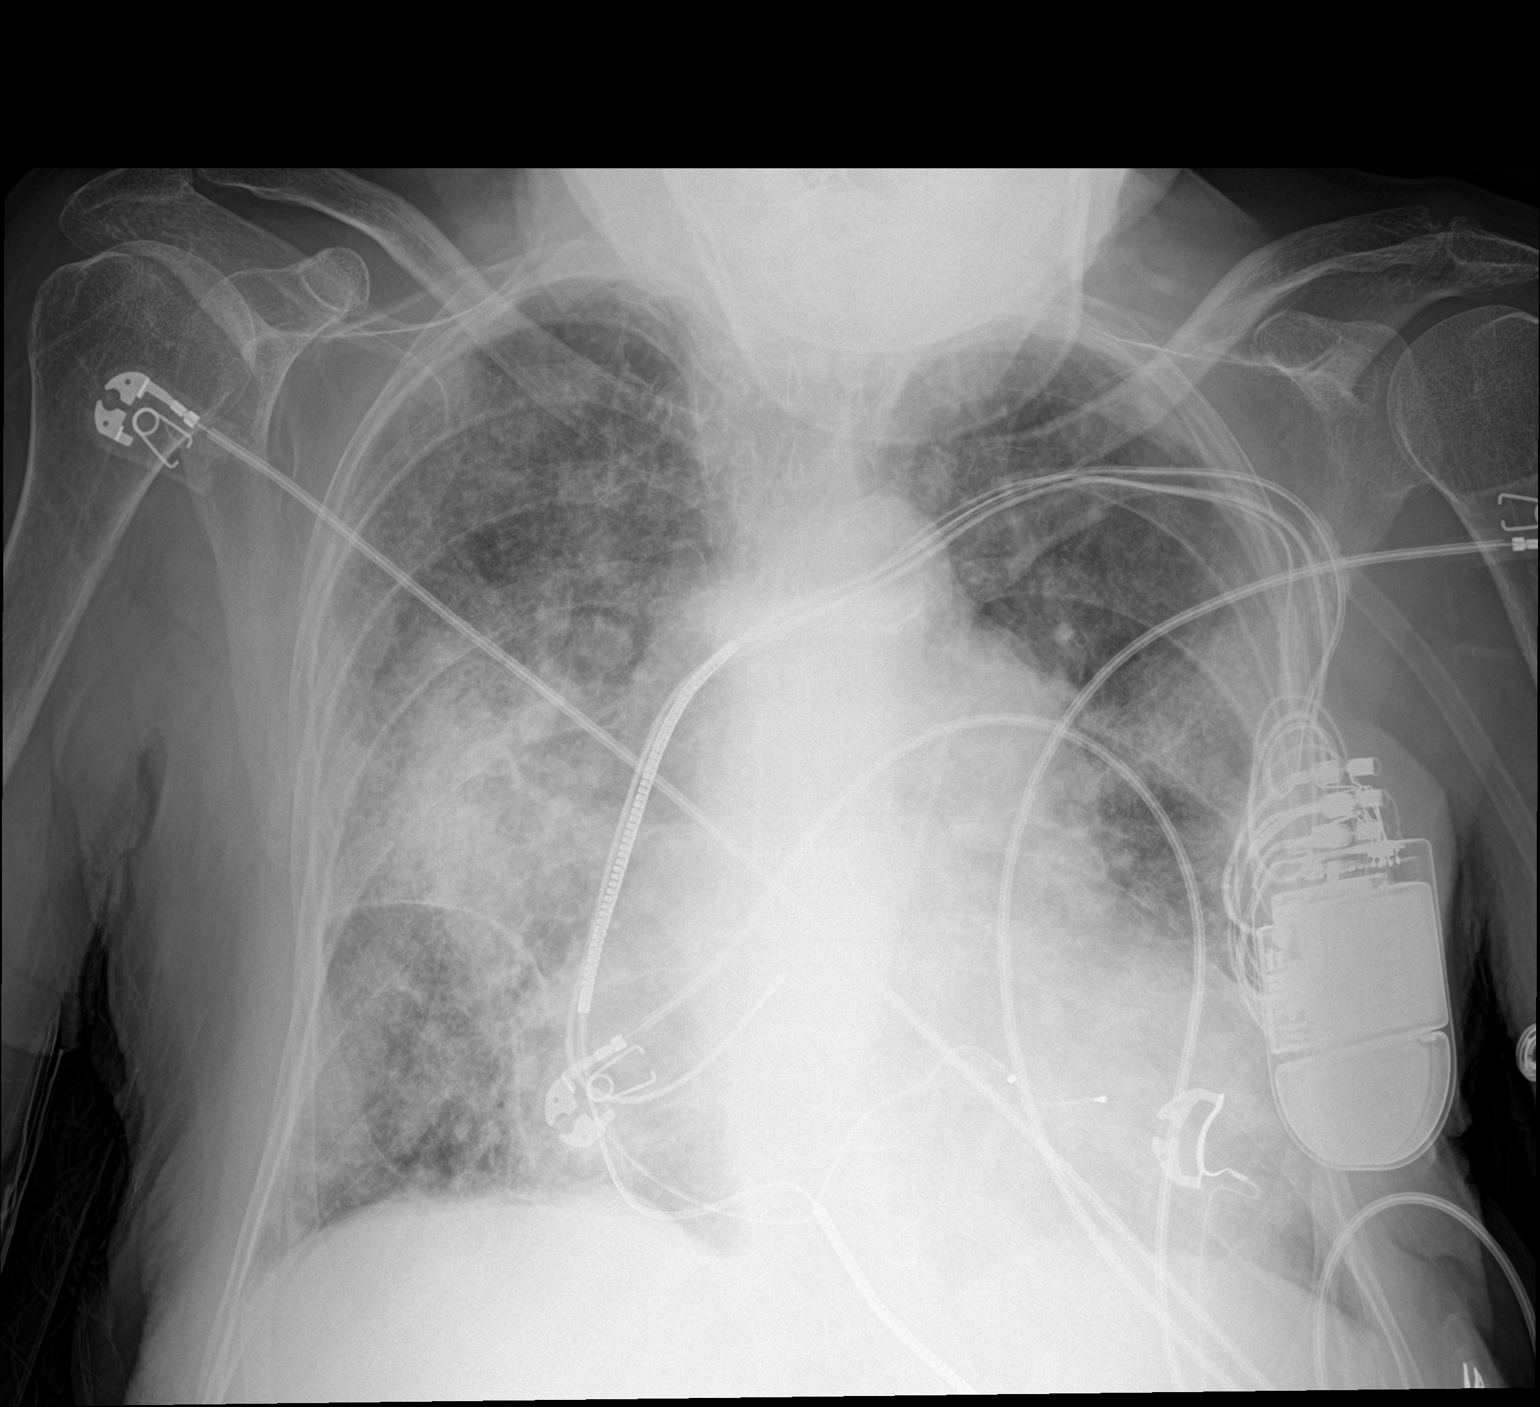

[1 of 1 positions shown; findings below may reference images not displayed]

FINDINGS: There is extensive airspace opacity in both mid and lower lung
regions, more on the right than on the left. More subtle opacity
noted in the right upper lobe.

There is cardiomegaly with central pulmonary vascular prominence and
peripheral tapering consistent with pulmonary arterial hypertension.
Pacemaker leads attached to right atrium, right ventricle, and
coronary sinus. No adenopathy appreciable by radiography. No bone
lesions. There is aortic atherosclerosis.
IMPRESSION: Cardiomegaly with pulmonary arterial hypertension. Question a degree
of cor pulmonale.

Widespread airspace opacity bilaterally, likely representing
multifocal pneumonia. Aspiration could present similarly. Both
aspiration and pneumonia may present concurrently.

Aortic Atherosclerosis ([id number].G).
